# Patient Record
Sex: Male | Born: 1940 | Race: White | Hispanic: No | Marital: Married | Smoking: Never smoker
Health system: Southern US, Community
[De-identification: ages and names within clinical notes are randomized; demographics above are authoritative.]

## PROBLEM LIST (undated history)

## (undated) DIAGNOSIS — M1990 Unspecified osteoarthritis, unspecified site: Secondary | ICD-10-CM

## (undated) DIAGNOSIS — F32A Depression, unspecified: Secondary | ICD-10-CM

## (undated) DIAGNOSIS — G4700 Insomnia, unspecified: Secondary | ICD-10-CM

## (undated) DIAGNOSIS — M216X9 Other acquired deformities of unspecified foot: Secondary | ICD-10-CM

## (undated) DIAGNOSIS — L719 Rosacea, unspecified: Secondary | ICD-10-CM

## (undated) DIAGNOSIS — R0683 Snoring: Secondary | ICD-10-CM

## (undated) DIAGNOSIS — Z87442 Personal history of urinary calculi: Secondary | ICD-10-CM

## (undated) DIAGNOSIS — R748 Abnormal levels of other serum enzymes: Secondary | ICD-10-CM

## (undated) DIAGNOSIS — T7840XA Allergy, unspecified, initial encounter: Secondary | ICD-10-CM

## (undated) DIAGNOSIS — M2140 Flat foot [pes planus] (acquired), unspecified foot: Secondary | ICD-10-CM

## (undated) DIAGNOSIS — N200 Calculus of kidney: Secondary | ICD-10-CM

## (undated) DIAGNOSIS — E785 Hyperlipidemia, unspecified: Secondary | ICD-10-CM

## (undated) DIAGNOSIS — E119 Type 2 diabetes mellitus without complications: Secondary | ICD-10-CM

## (undated) DIAGNOSIS — M79673 Pain in unspecified foot: Secondary | ICD-10-CM

## (undated) DIAGNOSIS — F419 Anxiety disorder, unspecified: Secondary | ICD-10-CM

## (undated) DIAGNOSIS — K219 Gastro-esophageal reflux disease without esophagitis: Secondary | ICD-10-CM

## (undated) DIAGNOSIS — R739 Hyperglycemia, unspecified: Secondary | ICD-10-CM

## (undated) HISTORY — DX: Insomnia, unspecified: G47.00

## (undated) HISTORY — DX: Other acquired deformities of unspecified foot: M21.6X9

## (undated) HISTORY — DX: Pain in unspecified foot: M79.673

## (undated) HISTORY — DX: Gastro-esophageal reflux disease without esophagitis: K21.9

## (undated) HISTORY — DX: Rosacea, unspecified: L71.9

## (undated) HISTORY — DX: Flat foot (pes planus) (acquired), unspecified foot: M21.40

## (undated) HISTORY — DX: Abnormal levels of other serum enzymes: R74.8

## (undated) HISTORY — DX: Snoring: R06.83

## (undated) HISTORY — PX: VASECTOMY: SHX75

## (undated) HISTORY — PX: TONSILLECTOMY AND ADENOIDECTOMY: SUR1326

## (undated) HISTORY — DX: Allergy, unspecified, initial encounter: T78.40XA

## (undated) HISTORY — DX: Hyperglycemia, unspecified: R73.9

## (undated) HISTORY — PX: SPINE SURGERY: SHX786

## (undated) HISTORY — DX: Type 2 diabetes mellitus without complications: E11.9

## (undated) HISTORY — DX: Unspecified osteoarthritis, unspecified site: M19.90

## (undated) HISTORY — DX: Calculus of kidney: N20.0

## (undated) HISTORY — DX: Hyperlipidemia, unspecified: E78.5

---
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
## 2002-03-15 ENCOUNTER — Encounter: Payer: Self-pay | Admitting: Family Medicine

## 2002-03-15 ENCOUNTER — Encounter: Admission: RE | Admit: 2002-03-15 | Discharge: 2002-03-15 | Payer: Self-pay | Admitting: Family Medicine

## 2004-08-28 ENCOUNTER — Ambulatory Visit: Payer: Self-pay

## 2006-04-05 ENCOUNTER — Ambulatory Visit: Payer: Self-pay | Admitting: Sports Medicine

## 2006-04-12 ENCOUNTER — Ambulatory Visit: Payer: Self-pay | Admitting: Sports Medicine

## 2006-04-13 ENCOUNTER — Ambulatory Visit: Payer: Self-pay | Admitting: Gastroenterology

## 2006-04-27 ENCOUNTER — Ambulatory Visit: Payer: Self-pay | Admitting: Gastroenterology

## 2006-04-27 ENCOUNTER — Encounter: Payer: Self-pay | Admitting: Family Medicine

## 2006-07-04 ENCOUNTER — Encounter: Payer: Self-pay | Admitting: Family Medicine

## 2007-08-16 ENCOUNTER — Encounter: Admission: RE | Admit: 2007-08-16 | Discharge: 2007-08-16 | Payer: Self-pay | Admitting: Family Medicine

## 2009-01-14 HISTORY — PX: FOOT SURGERY: SHX648

## 2009-01-25 ENCOUNTER — Observation Stay (HOSPITAL_COMMUNITY): Admission: EM | Admit: 2009-01-25 | Discharge: 2009-01-26 | Payer: Self-pay | Admitting: Otolaryngology

## 2009-02-06 ENCOUNTER — Encounter: Payer: Self-pay | Admitting: Family Medicine

## 2009-03-20 ENCOUNTER — Ambulatory Visit: Payer: Self-pay

## 2009-03-20 ENCOUNTER — Encounter: Payer: Self-pay | Admitting: Cardiovascular Disease

## 2009-04-03 ENCOUNTER — Encounter: Payer: Self-pay | Admitting: Family Medicine

## 2009-09-03 ENCOUNTER — Ambulatory Visit: Payer: Self-pay | Admitting: Sports Medicine

## 2009-09-08 ENCOUNTER — Encounter: Payer: Self-pay | Admitting: Family Medicine

## 2009-10-06 ENCOUNTER — Ambulatory Visit: Payer: Self-pay | Admitting: Sports Medicine

## 2009-10-06 DIAGNOSIS — M79609 Pain in unspecified limb: Secondary | ICD-10-CM

## 2009-10-06 DIAGNOSIS — M2140 Flat foot [pes planus] (acquired), unspecified foot: Secondary | ICD-10-CM | POA: Insufficient documentation

## 2009-10-07 ENCOUNTER — Encounter: Admission: RE | Admit: 2009-10-07 | Discharge: 2009-12-03 | Payer: Self-pay | Admitting: Family Medicine

## 2009-10-24 ENCOUNTER — Ambulatory Visit: Payer: Self-pay | Admitting: Pulmonary Disease

## 2009-10-24 DIAGNOSIS — R0609 Other forms of dyspnea: Secondary | ICD-10-CM | POA: Insufficient documentation

## 2009-10-24 DIAGNOSIS — R0989 Other specified symptoms and signs involving the circulatory and respiratory systems: Secondary | ICD-10-CM

## 2009-10-24 DIAGNOSIS — E785 Hyperlipidemia, unspecified: Secondary | ICD-10-CM | POA: Insufficient documentation

## 2009-10-24 DIAGNOSIS — G4700 Insomnia, unspecified: Secondary | ICD-10-CM | POA: Insufficient documentation

## 2009-10-27 ENCOUNTER — Encounter: Payer: Self-pay | Admitting: Pulmonary Disease

## 2009-10-29 ENCOUNTER — Ambulatory Visit: Payer: Self-pay | Admitting: Pulmonary Disease

## 2009-11-03 ENCOUNTER — Telehealth (INDEPENDENT_AMBULATORY_CARE_PROVIDER_SITE_OTHER): Payer: Self-pay | Admitting: *Deleted

## 2009-11-17 ENCOUNTER — Encounter: Payer: Self-pay | Admitting: Family Medicine

## 2009-11-20 ENCOUNTER — Encounter: Payer: Self-pay | Admitting: Family Medicine

## 2009-11-20 ENCOUNTER — Encounter: Admission: RE | Admit: 2009-11-20 | Discharge: 2009-11-20 | Payer: Self-pay | Admitting: Family Medicine

## 2009-12-11 ENCOUNTER — Encounter: Admission: RE | Admit: 2009-12-11 | Discharge: 2009-12-11 | Payer: Self-pay | Admitting: Family Medicine

## 2010-02-10 ENCOUNTER — Encounter: Payer: Self-pay | Admitting: Family Medicine

## 2010-07-15 ENCOUNTER — Telehealth (INDEPENDENT_AMBULATORY_CARE_PROVIDER_SITE_OTHER): Payer: Self-pay | Admitting: *Deleted

## 2010-08-15 ENCOUNTER — Encounter: Payer: Self-pay | Admitting: Family Medicine

## 2010-08-19 ENCOUNTER — Encounter: Payer: Self-pay | Admitting: Family Medicine

## 2010-08-19 LAB — CONVERTED CEMR LAB
BUN: 12 mg/dL
Creatinine, Ser: 1.1 mg/dL
Hgb A1c MFr Bld: 5.8 %

## 2010-08-31 ENCOUNTER — Ambulatory Visit
Admission: RE | Admit: 2010-08-31 | Discharge: 2010-08-31 | Payer: Self-pay | Source: Home / Self Care | Attending: Family Medicine | Admitting: Family Medicine

## 2010-08-31 ENCOUNTER — Other Ambulatory Visit: Payer: Self-pay | Admitting: Family Medicine

## 2010-08-31 LAB — HEPATIC FUNCTION PANEL
ALT: 35 U/L (ref 0–53)
AST: 25 U/L (ref 0–37)
Albumin: 4.1 g/dL (ref 3.5–5.2)
Alkaline Phosphatase: 70 U/L (ref 39–117)
Bilirubin, Direct: 0.2 mg/dL (ref 0.0–0.3)
Total Bilirubin: 1.1 mg/dL (ref 0.3–1.2)
Total Protein: 6.5 g/dL (ref 6.0–8.3)

## 2010-08-31 LAB — BASIC METABOLIC PANEL
BUN: 25 mg/dL — ABNORMAL HIGH (ref 6–23)
CO2: 29 mEq/L (ref 19–32)
Calcium: 9 mg/dL (ref 8.4–10.5)
Chloride: 101 mEq/L (ref 96–112)
Creatinine, Ser: 1.2 mg/dL (ref 0.4–1.5)
GFR: 61.64 mL/min (ref >60.00)
Glucose, Bld: 119 mg/dL — ABNORMAL HIGH (ref 70–99)
Potassium: 4.4 mEq/L (ref 3.5–5.1)
Sodium: 137 mEq/L (ref 135–145)

## 2010-08-31 LAB — LIPID PANEL
Cholesterol: 150 mg/dL (ref 0–200)
HDL: 41.7 mg/dL (ref >39.00)
LDL Cholesterol: 85 mg/dL (ref 0–99)
Total CHOL/HDL Ratio: 4
Triglycerides: 118 mg/dL (ref 0.0–149.0)
VLDL: 23.6 mg/dL (ref 0.0–40.0)

## 2010-09-01 ENCOUNTER — Ambulatory Visit
Admission: RE | Admit: 2010-09-01 | Discharge: 2010-09-01 | Payer: Self-pay | Source: Home / Self Care | Attending: Family Medicine | Admitting: Family Medicine

## 2010-09-01 DIAGNOSIS — E119 Type 2 diabetes mellitus without complications: Secondary | ICD-10-CM | POA: Insufficient documentation

## 2010-09-02 ENCOUNTER — Encounter (INDEPENDENT_AMBULATORY_CARE_PROVIDER_SITE_OTHER): Payer: Self-pay | Admitting: *Deleted

## 2010-09-02 DIAGNOSIS — Z87442 Personal history of urinary calculi: Secondary | ICD-10-CM | POA: Insufficient documentation

## 2010-09-02 DIAGNOSIS — M129 Arthropathy, unspecified: Secondary | ICD-10-CM | POA: Insufficient documentation

## 2010-09-07 ENCOUNTER — Encounter (INDEPENDENT_AMBULATORY_CARE_PROVIDER_SITE_OTHER): Payer: Self-pay | Admitting: *Deleted

## 2010-09-15 NOTE — Assessment & Plan Note (Signed)
Summary: ORTHOTICS,MC   Vital Signs:  Patient profile:   70 year old male Pulse rate:   59 / minute BP sitting:   119 / 74  (left arm)  Vitals Entered By: Terese Door (October 06, 2009 11:29 AM) CC: Orthotics   CC:  Orthotics.  History of Present Illness: Patient returns for custom orthotics Bilat foot pain has been worse since he suffered 3 MT fxs after fall now pain in forefoot bilat pain under 2nd toe some pain at arch on RT left 2nd toe is hammered and he gets pressure on top  did well with custom orthotics now the pair he has is wearing down we padded this some more and that helped  however comes for a new pair to see if he can reduce pain  Allergies: 1)  ! 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  The orthotic was heated and the patient stood on the orthotic blank positioned on the orthotic stand. The patient was positioned in subtalar neutral position and 10 degrees of ankle dorsiflexion in a weight bearing stance. After completion of molding a stable based was applied to the orthotic blank.   The blank was ground to a stable position for weight bearing. 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

## 2010-09-15 NOTE — Assessment & Plan Note (Signed)
Summary: apnea link with AHI of 1/hr.   Copy to:  Self Referral Primary Provider/Referring Provider:  Dr. Dorris Singh [facility]   History of Present Illness: Apnea link reviewed and shows AHI of 1/hr with no significant oxygen desaturation.  I suspect the pt's frequent awakenigs and inability to return to sleep is related to his admitted stress and anxiety.  Allergies: 1)  ! * Mycins   Other Orders: Pulse Oximetry, Overnight (04540)  Patient Instructions: 1)  work on behavioral therapies we discussed 2)  see behavioral therapist to work on anxiety and stress.

## 2010-09-15 NOTE — Progress Notes (Signed)
Summary: request for apnea link results  Phone Note Call from Patient Call back at Home Phone [phone]   Caller: Patient Call For: clance  Summary of Call: wants the results of apena test Initial call taken by: Lacinda Axon,  November 03, 2009 8:22 AM  Follow-up for Phone Call        Please advise results, thanks Greg Rogers  November 03, 2009 9:02 AM   Additional Follow-up for Phone Call Additional follow up Details #1::        let pt know that his apnea link showed no significant sleep apnea.  He needs to work on some of the things we discussed, and also to see behavioral therapist to help with stress and anxiety. Additional Follow-up by: Barbaraann Share MD,  November 05, 2009 6:46 PM    Additional Follow-up for Phone Call Additional follow up Details #2::    The patient is aware apnea link showed no significant sleep apnea and tha he needs to work on the things that they had discussed at his last ov and to see behavioral therapist to help with stress and anxiety. The patient had verbal understanding.i Follow-up by: Michel Bickers CMA,  November 06, 2009 8:56 AM

## 2010-09-15 NOTE — Progress Notes (Signed)
Summary: wants labs prior to visit  Phone Note Call from Patient Call back at Home Phone [phone]   Caller: Patient Summary of Call: Pt is coming in to see you on january 17 and he would like to have labs prior.  He is interested in having his blood glucose checked. Initial call taken by: Lowella Petties CMA, AAMA,  July 15, 2010 4:01 PM  Follow-up for Phone Call        cmet,lipid 272.0 Follow-up by: Crawford Givens MD,  July 15, 2010 9:07 PM  Additional Follow-up for Phone Call Additional follow up Details #1::        Lab appt made, Jan 16,2011 @ 8:10 Additional Follow-up by: Mills Koller,  July 16, 2010 8:17 AM

## 2010-09-15 NOTE — Assessment & Plan Note (Signed)
Summary: self referral for sleep disruption/insomnia   Copy to:  Self Referral Primary Provider/Referring Provider:  Dr. Dorris Singh Physicans  CC:  Sleep Consult.  History of Present Illness: The pt is a 70y/o male who comes in today for evaluation of sleeping issues.  The pt's primary complaint is that of frequent awakenings in the early morning hours, and often an issue with getting back to sleep once awake.  He does snore, and tried breath-rite strips, with improved airflow but no change in awakenings.  He has not be noted to have abnormal breathing pattern, nor has he had choking arousals.  He typically gets to bed around 11pm, and initiates sleep easily.  He starts to awaken around 3am, and will often have 3 or more awakenings for the rest of the night.  He sometimes gets back to sleep quickly, but other times may take 1 1/2 hours.  He typically stays in bed and tosses and turns.  He gets up btw 7-8am to start his day, and does feel rested.  He denies any RLS symptoms or kicking during the night.  He denies daytime sleep pressure with computer, tv, or reading unless he is "bored".  He denies any sleepiness with driving.  He denies any chronic pain that may be disturbing his sleep, but does have a lot of marital issues currently which are causing anxiety and stress.    Preventive Screening-Counseling & Management  Alcohol-Tobacco     Smoking Status: never  Current Medications (verified): 1)  Aspirin Ec Low Dose 81 Mg Tbec (Aspirin) .... Take 2 Tabs By Mouth Daily 2)  Simvastatin 10 Mg Tabs (Simvastatin) .... Take 1 Tab By Mouth At Bedtime  Allergies (verified): 1)  ! 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 sister with DM2 parents had DM 1 heart disease: father   Social History: Reviewed history from 10/06/2009 and no changes required.  gardens and stays active walking dog. pt is married. pt has children. pt is retired. still works part-time at [employer] job Patient never smoked.  Smoking Status:  never  Review of Systems  The patient denies shortness of breath with activity, shortness of breath at rest, productive cough, non-productive cough, coughing up blood, chest pain, irregular heartbeats, acid heartburn, indigestion, loss of appetite, weight change, abdominal pain, difficulty swallowing, sore throat, tooth/dental problems, headaches, nasal congestion/difficulty breathing through nose, sneezing, itching, ear ache, anxiety, depression, hand/feet swelling, joint stiffness or pain, rash, change in color of mucus, and fever.    Vital Signs:  Patient profile:   70 year old male Height:      67 inches Weight:      169.25 pounds BMI:     26.60 O2 Sat:      98 % on Room air Temp:     97.7 degrees F oral Pulse rate:   61 / minute BP sitting:   124 / 78  (left arm) Cuff size:   regular  Vitals Entered By: Arman Filter LPN (October 24, 2009 11:23 AM)  O2 Flow:  Room air CC: Sleep Consult Comments Medications reviewed with patient Arman Filter LPN  October 24, 2009  11:23 AM    Physical Exam  General:  thin male in nad Eyes:  PERRLA and EOMI.   Nose:  deviated septum to right with narrowing left patent Mouth:  no significant elongation of soft palate and uvula Neck:  no jvd, tmg, LN Lungs:  clear to auscultation Heart:  rrr, no mrg Abdomen:  soft and nontender, bs+ Extremities:  no edema noted, no cyanosis pulses intact distally Neurologic:  alert and oriented, moves all 4.   Impression & Recommendations:  Problem # 1:  PERSISTENT DISORDER INITIATING/MAINTAINING SLEEP  (ICD-307.42) the pt is having issues with moderate sleep disruption at night.  It is unclear what is waking him up, then his anxiety and stress kick in to keep him from getting back to sleep.  It is unclear if he has a sleep disorder that is causing the awakenings.  He really does not have a history c/w osa, but could have snoring arousals.  He has no obvious RLS symptoms, and doesn't think there is any environmental issues that need to be addressed.  I have gone over various behavioral techniques with him that may help with his inability to re-initiate sleep (ie stimulus control therapy).  Ultimately, I have encouraged him to seek counseling with behavioral therapist to help work thru his anxiety and stress associated with his marital issues.  He is concerned that he may have sleep apnea, and wishes to have testing done.  Will do screening home study to see if there is a suggestion of sleep disordered breathing.    Medications Added to Medication List This Visit: 1)  Aspirin Ec Low Dose 81 Mg Tbec (Aspirin) .... Take 2 tabs by mouth daily 2)  Simvastatin 10 Mg Tabs (Simvastatin) .... Take 1 tab by mouth at bedtime  Other Orders: New Patient Level IV (16109) Misc. Referral (Misc. Ref)  Patient Instructions: 1)  will do screening study at home to make sure you do not have sleep apnea.  Will call with results. 2)  work on behavioral therapies that we have discussed.

## 2010-09-15 NOTE — Assessment & Plan Note (Signed)
Summary: L FOOT ISSUES,MC   Vital Signs:  Patient profile:   70 year old male Height:      67 inches Weight:      162 pounds BMI:     25.46 BP sitting:   123 / 66  Vitals Entered By: Lillia Pauls CMA (September 03, 2009 9:23 AM)  History of Present Illness: Patient suffered 4 fractures to 3 of his left metatarsals after falling from a ladder this past summer. He also suffered a left 2nd toe dislocation which was surgically repaired. He reported for 2 post-op visits and x-rays. He was discharged from follow-up as he progressed well.  No pain/numbness/tingling/swelling/discoloration in the interim. However patient is aware of his multiple foot defomities and he feels as if his distal left metatarsals hit the ground when he walks. Thus he seeks preventive advice. He had tried comforthotics with MT pads which did not signifcantly help him. He had tried custom orthotics 4 yrs ago from which he derived significantly increased comfort.   Allergies (verified): 1)  ! * Mycins PMH-FH-SH reviewed for relevance  Physical Exam  General:  Well-developed,well-nourished,in no acute distress; alert,appropriate and cooperative throughout examination Msk:  KNEES: Full ROM/strength.  ANKLES/FEET: 20 deg external rotation on the right. Calcaneal Valgus (R>L). Excesiive pronation and pes planus (R>L). Hallux Valgus. Hammering of toes 2-5. Subluxed left 2nd toe. Supination of small toes. Transverse arch collapse. 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 No ttp. Full strength. No ROM of the great toes. Extendable hammer toes.  SHOES: Tight. No room for big toe. Minimal room for smaller toes.  GAIT: Excessive pronation which is significantly decreased on orthotic insertion.   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 Patient noticed increased comfort afterward. - Use  orthotics in walking shoes. - Purchase shoes approximately 1/2 size larger than current shoes. 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 Will add blue MT pads to plantar aspect of these orthotics.

## 2010-09-16 ENCOUNTER — Telehealth: Payer: Self-pay | Admitting: Family Medicine

## 2010-09-16 ENCOUNTER — Encounter: Payer: Self-pay | Admitting: Family Medicine

## 2010-09-17 ENCOUNTER — Telehealth: Payer: Self-pay | Admitting: Family Medicine

## 2010-09-17 NOTE — Miscellaneous (Signed)
Summary: med list update - test strips  Clinical Lists Changes  Medications: Added new medication of ACCU-CHEK AVIVA  STRP (GLUCOSE BLOOD) check blood sugar daily     Prior Medications: ASPIRIN EC LOW DOSE 81 MG TBEC (ASPIRIN) take 2 tabs by mouth daily SIMVASTATIN 10 MG TABS (SIMVASTATIN) Take 1 tab by mouth at bedtime MULTIVITAMINS   TABS (MULTIPLE VITAMIN) Once every 3 days. METROGEL 1 % GEL (METRONIDAZOLE) AAA two times a day ACCU-CHEK AVIVA  STRP (GLUCOSE BLOOD) check blood sugar daily Current Allergies: ! * MYCINS ! PNEUMOVAX 23

## 2010-09-17 NOTE — Letter (Signed)
Summary: Greg Rogers at [facility] at [name]   Imported By: Maryln Gottron 08/25/2010 13:10:28  _____________________________________________________________________  External Attachment:    Type:   Image     Comment:   External Document

## 2010-09-17 NOTE — Letter (Signed)
Summary: [facility]  [facility]   Imported By: Maryln Gottron 08/25/2010 13:16:02  _____________________________________________________________________  External Attachment:    Type:   Image     Comment:   External Document

## 2010-09-17 NOTE — Miscellaneous (Signed)
Clinical Lists Changes  Problems: Added new problem of NEPHROLITHIASIS, HX OF (ICD-V13.01) Added new problem of ARTHRITIS (ICD-716.90) Added new problem of FAMILY HISTORY OF COLON CA 1ST DEGREE RELATIVE <60 (ICD-V16.0) Added new problem of FAMILY HISTORY DIABETES 1ST DEGREE RELATIVE (ICD-V18.0) Observations: Added new observation of PT OCCUPAT: employed (09/02/2010 10:28) Added new observation of SOCIAL HX: Occupation: Retired (Nov. 2007) 27-1/2 years of service.  Was Pesticide Barrister's clerk Education:  Agricultural consultant at [employer], part time at [employer] now pt is married, 1969, 4 kids (two are MDs) pt is retired. still works part-time at [employer] job Patient never smoked.  gardens and stays active walking dog, enjoys skiing, very active but no formal exercise program, walks at least 1/2 mile daily. alcohol: wine, 6 glasses a week on average Drug use-no   (09/02/2010 10:28) Added new observation of DRUG USE: no (09/02/2010 10:28) Added new observation of FAMILY HX: sister with DM2 heart disease: father, first MI at 21, deat at 108 mother dead at 31 father, first MI at 15, deat at 46 GF with DM1 Family History of Arthritis, grandparents, other blood relative Family History of Colon CA 1st degree relative <60, other blood relative Family History Diabetes 1st degree relative, grandparents, other blood relative Family History High cholesterol, parents Family History Hypertension, other blood relative Family History of Heart Disease, parents, grandparents, other blood relative Family History of Emotional/Mental Illness, other blood relative (09/02/2010 10:28) Added new observation of FH HTN: Family History Hypertension (09/02/2010 10:28) Added new observation of FH-HIGHCHOL: Family History High cholesterol (09/02/2010 10:28) Added new observation of FH DIABETES: Family History Diabetes 1st degree relative (09/02/2010 10:28) Added new observation of FH COLON CA:  Family History of Colon CA 1st degree relative <60 (09/02/2010 10:28) Added new observation of FH ARTHRITIS: Family History of Arthritis (09/02/2010 10:28) Added new observation of PAST SURG HX: June 2010 L foot surgery  1949  appendectomy at age [age]  hernia repairs at age 88(single) 89  hernia repair (double) 1960  cyst removed from breast at age 15  ~ 60  tonsillectomy and adenoidectomy  ~ 1984 Vasectomy  (09/02/2010 10:28) Added new observation of PAST MED HX: OTHER ACQUIRED DEFORMITY OF ANKLE AND FOOT OTHER (ICD-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.79) rosacea H/O ISOLATED GGT WITH O/W NORMAL W/U  ARTHRITIS (ICD-716.90) NEPHROLITHIASIS, HX OF (ICD-V13.01) HEALTH SCREENING (ICD-V70.0) HYPERGLYCEMIA, MILD (ICD-790.29) SNORING (ICD-786.09) PERSISTENT DISORDER INITIATING/MAINTAINING SLEEP (ICD-307.42) HYPERLIPIDEMIA (ICD-272.4) PES PLANUS (ICD-734) FOOT PAIN, BILATERAL (ICD-729.5)   (09/02/2010 10:28) Added new observation of PMH KIDNY ST: yes (09/02/2010 10:28) Added new observation of PNEUMOVAX: given (08/17/2003 10:39) Added new observation of COLONOSCOPY: Done (08/17/2003 10:39)      Past History:  Past Medical History: OTHER ACQUIRED DEFORMITY OF ANKLE AND FOOT OTHER (ICD-736.79) rosacea H/O ISOLATED GGT WITH O/W NORMAL W/U  ARTHRITIS (ICD-716.90) NEPHROLITHIASIS, HX OF (ICD-V13.01) HEALTH SCREENING (ICD-V70.0) HYPERGLYCEMIA, MILD (ICD-790.29) SNORING (ICD-786.09) PERSISTENT DISORDER INITIATING/MAINTAINING SLEEP (ICD-307.42) HYPERLIPIDEMIA (ICD-272.4) PES PLANUS (ICD-734) FOOT PAIN, BILATERAL (ICD-729.5)    Past Surgical History: June 2010 L foot surgery  1949  appendectomy at age [age]  hernia repairs at age 76(single) 41  hernia repair (double) 1960  cyst removed from breast at age 10  ~ 76  tonsillectomy and adenoidectomy  ~ 1984 Vasectomy   Family History: sister with DM2 heart disease: father, first MI at 70, deat at 17 mother dead at 5 father, first MI at 13, deat  at 7 GF with DM1 Family History of Arthritis, grandparents, other blood  relative Family History of Colon CA 1st degree relative <60, other blood relative Family History Diabetes 1st degree relative, grandparents, other blood relative Family History High cholesterol, parents Family History Hypertension, other blood relative Family History of Heart Disease, parents, grandparents, other blood relative Family History of Emotional/Mental Illness, other blood relative  Social History: Occupation: Retired (Nov. 2007) 27-1/2 years of service.  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 still works part-time at [employer] job Patient never smoked.  gardens and stays active walking dog, enjoys skiing, very active but no formal exercise program, walks at least 1/2 mile daily. alcohol: wine, 6 glasses a week on average Drug use-no  Drug Use:  no Occupation:  employed    Preventive Care Screening  Last Pneumovax:    Date:  08/17/2003    Results:  given  Colonoscopy:    Date:  08/17/2003    Results:  Done     ALLERGIC TO PNEUMOVAX GIVEN IN 2005

## 2010-09-17 NOTE — Letter (Signed)
Summary: Greg Rogers at [facility] at [facility]   Imported By: Maryln Gottron 08/25/2010 13:12:21  _____________________________________________________________________  External Attachment:    Type:   Image     Comment:   External Document

## 2010-09-17 NOTE — Letter (Signed)
Summary: [facility]  [facility]   Imported By: Maryln Gottron 08/25/2010 13:14:51  _____________________________________________________________________  External Attachment:    Type:   Image     Comment:   External Document

## 2010-09-17 NOTE — Letter (Signed)
Summary: Greg Rogers at [facility] at [facility]   Imported By: Maryln Gottron 08/25/2010 13:08:56  _____________________________________________________________________  External Attachment:    Type:   Image     Comment:   External Document

## 2010-09-17 NOTE — Assessment & Plan Note (Signed)
Summary: TRANSFER FROM [facility]   Vital Signs:  Patient profile:   70 year old male Height:      67 inches Weight:      165.75 pounds BMI:     26.05 Temp:     97.7 degrees F oral Pulse rate:   80 / minute Pulse rhythm:   regular BP sitting:   124 / 68  (left arm) Cuff size:   regular  Vitals Entered By: Delilah Shan CMA [place] [facility]) (September 01, 2010 8:30 AM) CC: Transfer from [place] - [facility], Preventive Care   History of Present Illness: Elevated Cholesterol: Using medications without problems:yes Muscle aches: minimal and rare- if worse he'll let me know Other complaints: no  Sugar had been >100 - 125 on home check.  labs d/w patient.  Known h/o prediabetes.   Tick bites last summer.  Was achy.  Was put on doxy for 28 days.  Got antibody testing was done and this was negative.  Sx resolved in meantime.    Current Medications (verified): 1)  Aspirin Ec Low Dose 81 Mg Tbec (Aspirin) .... Take 2 Tabs By Mouth Daily 2)  Simvastatin 10 Mg Tabs (Simvastatin) .... Take 1 Tab By Mouth At Bedtime 3)  Multivitamins   Tabs (Multiple Vitamin) .... Once Every 3 Days. 4)  Metrogel 1 % Gel (Metronidazole) .... Aaa Two Times A Day  Allergies: 1)  ! * Mycins 2)  ! Pneumovax 23  Past History:  Past Medical History: HYPERLIPIDEMIA (ICD-272.4) PES PLANUS (ICD-734) FOOT PAIN, BILATERAL (ICD-729.5) OTHER ACQUIRED DEFORMITY OF ANKLE AND FOOT OTHER (ICD-736.79) rosacea H/O ISOLATED GGT WITH O/W NORMAL W/U   Past Surgical History: L foot surgery June 2010 appendectomy at age 67 hernia repairs at age 5 cyst removed from breast at age 43 tonsillectomy Vasectomy  Family History: Reviewed history from 10/24/2009 and no changes required. sister with DM2 heart disease: father, first MI at 39, deat at 29 mother dead at 65 father, first MI at 66, deat at 22 GF with DM1  Social History: Reviewed history from 10/24/2009 and no changes required. gardens and stays active walking dog,  enjoys skiing pt is married, 1969, 4 kids (two are MDs) pt is retired. still works part-time at [employer] job Patient never smoked.  Entemology at [facility], part time at [employer] now alcohol: wine, 6 glasses a week on average  Review of Systems       See HPI.  Otherwise negative.    Physical Exam  General:  GEN: nad, alert and oriented HEENT: mucous membranes moist NECK: supple w/o LA CV: rrr.  no murmur PULM: ctab, no inc wob ABD: soft, +bs EXT: no edema SKIN: no acute rash  Rectal:  No external abnormalities noted except for old, small ext hemorrhoid.   Normal sphincter tone. No rectal masses or tenderness. Prostate:  Prostate gland firm and smooth, no enlargement, nodularity, tenderness, mass, asymmetry or induration.   Impression & Recommendations:  Problem # 1:  HYPERGLYCEMIA, MILD (ICD-790.29) >25 min spent with patient, at least half of which was spent on counseling re: plan.  continue working on diet and exercise.  Recent labs d/w patient.  other issues.  PSA options were discussed along with recent recs.  No indication for psa at this point, since patient is low risk and there is no FH of prosate CA.  He agreed and declined testing of PSA.   Problem # 2:  HYPERLIPIDEMIA (ICD-272.4) No change in meds.  labs d/w patient.  His updated medication list for this problem includes:    Simvastatin 10 Mg Tabs (Simvastatin) .Marland Kitchen... Take 1 tab by mouth at bedtime  Complete Medication List: 1)  Aspirin Ec Low Dose 81 Mg Tbec (Aspirin) .... Take 2 tabs by mouth daily 2)  Simvastatin 10 Mg Tabs (Simvastatin) .... Take 1 tab by mouth at bedtime 3)  Multivitamins Tabs (Multiple vitamin) .... Once every 3 days. 4)  Metrogel 1 % Gel (Metronidazole) .... Aaa two times a day  Colorectal Screening:  Current Recommendations:    Hemoccult: NEG X 1 today  Immunization & Chemoprophylaxis:    Tetanus vaccine: Historical  (08/16/2004)  Patient Instructions: 1)  Keep working on your diet and  exercise.  I would check your A1c in 6 months with an OV a few days after that.  dx 790.29. 2)  Don't change your meds in the meantime.   3)  Take care and let me know if you have other concerns.  Glad to see you.  Prescriptions: SIMVASTATIN 10 MG TABS (SIMVASTATIN) Take 1 tab by mouth at bedtime  #90 x 3   Entered and Authorized by:   Crawford Givens MD   Signed by:   Crawford Givens MD on 09/01/2010   Method used:   Electronically to        [facility]  [address] [phone]* (retail)       [address]       [place], [place]  [id number]       Ph: [phone] or [phone]       Fax: [phone]   RxID:   [id number]    Orders Added: 1)  Est. Patient 65& > [99397] 2)  Est. Patient Level III [38756]   Immunization History:  Tetanus/Td Immunization History:    Tetanus/Td:  historical (08/16/2004)   Immunization History:  Tetanus/Td Immunization History:    Tetanus/Td:  Historical (08/16/2004)  Current Allergies (reviewed today): ! Hilbert Bible ! PNEUMOVAX 23

## 2010-09-23 ENCOUNTER — Telehealth: Payer: Self-pay | Admitting: Family Medicine

## 2010-09-23 NOTE — Progress Notes (Signed)
  Phone Note Outgoing Call   Summary of Call: I called patient.  We requested his old records, but I don't see a copy of his colonosopy report.  I called and LMOVM for patient to call back.  I will await his return call. Crawford Givens MD  September 16, 2010 6:13 PM

## 2010-09-23 NOTE — Progress Notes (Signed)
  Phone Note Outgoing Call   Summary of Call: I called patient.  His colonoscopy was done  ~6 years ago and was normal per patient.  He advised me to call the health service at [employer] and see if they had a copy.  I did and they will get him to sign a release to send the copy to me.  Crawford Givens MD  September 17, 2010 2:08 PM

## 2010-10-01 NOTE — Progress Notes (Signed)
Summary: colonoscopy  Phone Note Call from Patient Call back at Home Phone [phone]   Caller: Patient Summary of Call: Patient wanted me to let you know that he has faxed over a copy of his last colonoscopy dated 04-27-2006. Report is on your desk. Initial call taken by: Melody Comas,  September 23, 2010 11:52 AM  Follow-up for Phone Call        Noted.  I entered in EMR.  Please scan hard copy. Crawford Givens MD  September 23, 2010 1:06 PM       Colonoscopy  Procedure date:  04/27/2006  Findings:      RESULTS NORMAL, done by Dr. Arlyce Dice  Procedures Next Due Date:    Colonoscopy: 04/2016

## 2010-10-07 NOTE — Letter (Signed)
Summary: note   note   Imported By: Kassie Mends 09/28/2010 09:32:20  _____________________________________________________________________  External Attachment:    Type:   Image     Comment:   External Document

## 2010-10-13 NOTE — Procedures (Signed)
Summary: Colonoscopy/[facility]  Colonoscopy/[facility]   Imported By: Lanelle Bal 10/05/2010 15:42:11  _____________________________________________________________________  External Attachment:    Type:   Image     Comment:   External Document

## 2010-11-23 LAB — POCT I-STAT 4, (NA,K, GLUC, HGB,HCT)
Potassium: 3.7 mEq/L (ref 3.5–5.1)
Sodium: 141 mEq/L (ref 135–145)

## 2010-12-29 NOTE — Discharge Summary (Signed)
Greg Rogers, Greg Rogers NO.:  [id number]   MEDICAL RECORD NO.:  [id number]          PATIENT TYPE:  OBV   LOCATION:  5013                         FACILITY:  [facility]   PHYSICIAN:  Eulas Post, MD    DATE OF BIRTH:  April 30, 1941   DATE OF ADMISSION:  01/25/2009  DATE OF DISCHARGE:  01/26/2009                               DISCHARGE SUMMARY   ADMISSION DIAGNOSES:  1. Left second metatarsophalangeal joint dislocation.  2. Left third, fourth, and fifth metatarsal fractures including a      Jones fracture.   DISCHARGE MEDICATIONS:  Percocet 1-2 tablets p.o. q.6 h. p.r.n. pain.   DISCHARGE INSTRUCTIONS:  He should be nonweightbearing and elevate the  left lower extremity.  He is not allowed to drive or operate heavy  machinery until 7 p.m. on January 26, 2009.  This is 24 hours after his  general anesthesia.  He should also be nonweightbearing using crutches  with the left lower extremity.   FOLLOWUP:  Two weeks with myself as an outpatient.   DISCHARGE DIAGNOSES:  1. Left second metatarsophalangeal joint dislocation.  2. Left third, fourth, and fifth metatarsal fractures including a      Jones fracture.   HOSPITAL COURSE:  Mr. Greg Rogers is a 70 year old gentleman who fell  from a ladder on January 24, 2009.  He had multiple foot fractures and on x-  ray had a second metatarsophalangeal joint dislocation.  He did have a  history of hammertoe, but he says that he was never told that it was  actually dislocated.  He was treated conservatively for this.  He had an  attempted closed reduction in Urgent Care for the treatment of his acute  MTP dislocation, which did not successfully reduce the joint.  It was  unclear whether or not his second metatarsophalangeal joint was  dislocated due to his chronic hammer toe, or due to his acute major  trauma to his foot.  He had significant soft tissue swelling as well as  dimpling of the skin at the second metatarsophalangeal joint.  Therefore, he was brought urgently to Surgery for open reduction and  evaluation of the joint.  There was some soft tissue interposed within  the joint; however, there seemed to be a contracture of the dorsal  capsular tissue.  Therefore, I am not 100% sure whether or not this was  a traumatic dislocation or secondary to his hammertoe disease.  Nevertheless, we were able to get the capsular tissue released and do  some additional soft tissue balancing in order to allow for satisfactory  joint reduction.  In a passive state at this point, his joint remained  reduced.  I did not use a K-wire for this reason, as well as the fact  that pre-operatively he indicated to me that he has a tendency towards  being non-compliant, and would have a hard time with non-weight bearing  restrictions.  In light of this, I did not want to put him at risk for  hardware failure and broken intra-articular broken k-wires.  He was then  admitted to the hospital for overnight observation.  He did well and his  pain was well controlled and he was keeping the foot elevated.  I had a  long discussion with him because he was planning on leaving early this  morning and driving himself home.  I recommended that he either take a  taxi or wait until 7 o'clock or the recommended time between anesthesia  and when he can drive has arrived.  I have also talked with his wife  last night and tried to help them plan accordingly so that his wife  could have someone here to pick him up and drive his truck home along  with him, so he was not required to drive.  She was going to work on  this.  He has discussed that he wants to sign an against medical advice  form in order that he can leave and drive himself home.  I have  indicated to him that I am not sure that this is adequate, and that I,  nor the hospital, will assume any liability for his actions should he  decide to leave against medical advice.  He certainly is competent for   consent and competent to make this decision on his own; however, and not  sure medicolegally how this works with  the policy of anesthesia and the hospital requirements.  Therefore, I  have told him that my recommendation is that he wait until 7 o'clock to  drive himself home tonight, or alternatively have his wife bring an  extra friend that can drive this patient home.  I will plan to see him  in 2 weeks as an outpatient.      Eulas Post, MD  Electronically Signed     JPL/MEDQ  D:  01/26/2009  T:  01/26/2009  Job:  [id number]

## 2010-12-29 NOTE — H&P (Signed)
NAMEPANCHO, RUSHING NO.:  [id number]   MEDICAL RECORD NO.:  [id number]          PATIENT TYPE:  OBV   LOCATION:  5013                         FACILITY:  [facility]   PHYSICIAN:  Eulas Post, MD    DATE OF BIRTH:  04/28/1941   DATE OF ADMISSION:  01/25/2009  DATE OF DISCHARGE:                              HISTORY & PHYSICAL   CHIEF COMPLAINT:  Left foot pain.   HISTORY:  Mr. Greg Schertzer is a 70 year old gentleman who fell off of a  ladder on January 24, 2009 and injured his left foot.  He was reluctant to  seek medical advice.  He came to urgent care today and was evaluated and  x-rays were taken.  Attempted reduction was carried out in urgent care,  after the x-rays demonstrated evidence for a second metatarsophalangeal  joint dislocation, as well as a third, fourth, and fifth metatarsal  fractures.  He had dimpling of the skin, and closed reduction was unable  to be achieved.  Therefore, he was sent over to the [facility]  for urgent operative intervention for his dislocation.  He complains of  moderate to severe pain in the foot and describes it as sharp.  He has  had significant soft tissue swelling.  Bearing weight makes it worse and  keeping elevated makes it better.  He has a history of a hammer toe on  the 2nd, and was treated with shoe-wear modification.  He says he does  not know if he ever had an x-ray of his foot, and does not think it was  ever dislocated before.  He does think that the toe is shorter than  normal since his fall from the ladder.  The hammer toe bothered him  mostly with irritation from his shoes.   PAST MEDICAL HISTORY:  He denies any significant medical problems.   FAMILY HISTORY:  His father had a heart attack at age 42 and he has  diabetes in the family.   SOCIAL HISTORY:  He is a nonsmoker and is retired.   REVIEW OF SYSTEMS:  Complete review of systems was performed and was  otherwise negative with the exception of  those noted in the  musculoskeletal system.   PHYSICAL EXAMINATION:  CONSTITUTIONAL:  He is alert and oriented x3, in  no acute distress, and is well developed and well nourished.  EYE:  Extraocular movements are intact.  LYMPHATIC:  He has no axillary or cervical lymphadenopathy.  CV:  No pedal edema, although, he has significant soft tissue swelling  in the left foot.  RESPIRATORY:  He has no cyanosis and no increase in respiratory efforts.  PSYCHIATRIC:  His judgment and insight are intact, and he is competent  for consent.  SKIN:  He has no open wounds on his foot, but he has significant soft  tissue swelling with ecchymosis throughout.  He is tender along the  dorsum and plantar aspect of the foot as well as the second  metatarsophalangeal joint.  He has no tenderness along the medial column  of the first ray.  NEUROLOGIC:  His sensation seems to be intact throughout the foot.  MSK:  Significant soft tissue swelling throughout foot, with tenderness  along the midfoot, and some pain as well at the 2nd metatarsal-  phalangeal joint. The 2nd toe has severe hyperextension of the MTP, and  the PIP joint is flexed.  This is not correctable with manipulation, and  does cause pain.   X-RAYS:  X-rays taken at urgent care demonstrated evidence for a second  metatarsophalangeal joint dislocation.  There is also evidence for  third, fourth, and fifth metatarsal fractures, and on the fifth  metatarsal, are involved both the distal aspect as well as the proximal  aspect.   IMPRESSION:  Left second metatarsophalangeal joint dislocation along  with third, fourth, and fifth metatarsal fractures, including the base  of the fifth.   PLAN:  Unfortunately, given the skin dimpling, it appears that he  probably has soft tissue that is interposed within the first  metatarsophalangeal joint.  It is theoretically possible that his joint  dislocation may be due to his chronic hammer-toe, but his  x-rays and  clinical presentation are more consistent with an acute dislocation,  given the tibial deviation on the AP, and also the fact that he believes  his toe is shorter than normal.  I have recommended an urgent open  reduction.  We have also discussed the possibility of pin placement if  the joint is not stable after the open reduction, although he has  already indicated to me that he has a tendency towards non-compliance,  which makes me less inclined to place a pin across the joint, which  could ultimately lead to a broken pin if he walks on it, and thereby a  potentially difficult to solve hardware complication.  We have also  discussed the options for management of his multiple foot fractures.  Specifically, we have discussed the management of his Jones fracture on  the fifth metatarsal, and the risks for nonunion or delayed union.  He,  at the current time, is inclined towards managing his foot fractures  conservatively, and not placing any hardware, and simply proceeding with  an open reduction of the second metatarsophalangeal joint.  Therefore,  we will plan to proceed with this plan.  The risks, benefits, and  alternatives to operative intervention on the second metatarsophalangeal  joint were discussed with him preoperatively including, but not limited  to the risks of infection, bleeding, nerve injury, recurrent  dislocation, posttraumatic arthritis, stiffness, the need for further  future surgical intervention, compartment syndrome of the foot,  cardiopulmonary complications among others and he is willing to proceed.  We also decided to manage his 3rd, 4th, and 5th foot fractures  conservatively without manipulation or surgical intervention.  He will  be admitted to the hospital overnight for pain control, elevation, and  postoperative antibiotics.      Eulas Post, MD  Electronically Signed     JPL/MEDQ  D:  01/25/2009  T:  01/26/2009  Job:  [id number]

## 2010-12-29 NOTE — Op Note (Signed)
NAMEGERONIMO, DILIBERTO NO.:  [id number]   MEDICAL RECORD NO.:  [id number]          PATIENT TYPE:  OBV   LOCATION:  5013                         FACILITY:  [facility]   PHYSICIAN:  Eulas Post, MD    DATE OF BIRTH:  Jul 15, 1941   DATE OF PROCEDURE:  01/25/2009  DATE OF DISCHARGE:                               OPERATIVE REPORT   PREOPERATIVE DIAGNOSIS:  Left second metatarsophalangeal joint  dislocation.   POSTOPERATIVE DIAGNOSIS:  Left second metatarsophalangeal joint  dislocation.   OPERATIVE PROCEDURE:  Open reduction left second metatarsophalangeal  joint dislocation.   ANESTHESIA:  General.   TOURNIQUET TIME:  30 minutes.   PREOPERATIVE INDICATIONS:  Mr. Saahas Hidrogo is a 70 year old gentleman  who fell off a ladder yesterday.  He had fifth metatarsal base and neck  fracture, fourth metatarsal fracture, and third metatarsal fracture as  well.  We have already decided to treat these conservatively without  manipulation, according to his wishes.  He also sustained what appears  to be an acute second metatarsophalangeal joint dislocation.  He had a  previous history of having a hammer toe on his second toe, and states  that he had this evaluated, and tired different heights of braces, but  this did not alleviate his problem.  He states he is not aware that the  joint was every dislocated before.  He said that the toe did appear  shortened compared to what it normally does, now with substantial soft  tissue swelling.  Therefore, wife felt that this was an acute  dislocation.  Closed reduction was attempted in Urgent Care, but this  was unsuccessful.  Therefore, he elected to proceed with an open  reduction of the second metatarsophalangeal joint dislocation.  We also  discussed the operative treatment of his multiple other fractures of the  foot, especially his joint fracture, and his desire was to attempt  conservative management for these fractures prior to  proceeding with any  type of hardware.  Therefore, the risks, benefits, and alternatives to  isolated second metatarsophalangeal joint open reduction was discussed  with him and including but not limited to the risks of infection,  bleeding, nerve injury, malunion of the metatarsals, nonunion, recurrent  dislocation, recurrent hammer toe, neurologic injury, instability of the  toe, the need for future surgical intervention on both the  metatarsophalangeal joint, as well as his other fractures,  cardiopulmonary complications, among others, and he was willing to  proceed.   OPERATIVE PROCEDURE:  The patient was brought to the operating room and  placed in a supine position.  Intravenous Ancef 1 g was given.  The left  lower extremity was prepped and draped in the usual sterile fashion  after general anesthesia was administered.  The leg was elevated and a  tourniquet was inflated.  Incision was made dorsally over the second  metatarsophalangeal joint.  The joint was exposed and care was taken to  protect the neurovascular structures on the tibial and fibular side.  The capsule was incised sharply and the soft tissue interposed within  the joint  was swept aside.  This allowed for joint reduction.  The joint  was then examined under C-arm and it was found to be anatomic.  The soft  tissue around the joint was balanced in order to allow a more optimal  position of  the toe.  The wounds were irrigated copiously, and the skin closed with  nylon.  The tourniquet was released, and sterile dressings and a  posterior splint were applied.  The patient was awakened and returned to  the PACU in stable and satisfactory condition.  There were no  complications.  The patient tolerated the procedure well.      Eulas Post, MD  Electronically Signed     JPL/MEDQ  D:  01/25/2009  T:  01/26/2009  Job:  [id number]

## 2011-01-04 ENCOUNTER — Encounter: Payer: Self-pay | Admitting: Family Medicine

## 2011-01-04 ENCOUNTER — Ambulatory Visit (INDEPENDENT_AMBULATORY_CARE_PROVIDER_SITE_OTHER): Payer: Medicare Other | Admitting: Family Medicine

## 2011-01-04 DIAGNOSIS — R739 Hyperglycemia, unspecified: Secondary | ICD-10-CM

## 2011-01-04 DIAGNOSIS — R21 Rash and other nonspecific skin eruption: Secondary | ICD-10-CM | POA: Insufficient documentation

## 2011-01-04 DIAGNOSIS — Z125 Encounter for screening for malignant neoplasm of prostate: Secondary | ICD-10-CM | POA: Insufficient documentation

## 2011-01-04 DIAGNOSIS — R7309 Other abnormal glucose: Secondary | ICD-10-CM

## 2011-01-04 NOTE — Assessment & Plan Note (Signed)
Return for labs.  D/w pt.  

## 2011-01-04 NOTE — Assessment & Plan Note (Signed)
Mild SI joint pain.  D/w pt re stretching and fu prn.

## 2011-01-04 NOTE — Progress Notes (Signed)
Leg pain. Near the L SI joint.  Pain crossing the L leg over the R when getting out of shower and drying off.  Intermittent pain.    DM2 recheck due in 8/12.   Plan for PSA in 8/12 with OV a few days later.  He wanted to proceed with testing.  He is aware of change in guidelines.    R foot rash, now improving . He had h/o tick bites in past, but no no engorged ticks, FCNAV.  Rash/skin changes on R anterior ankle, after bumping it on a board.  Erythema is resolving.  No ulceration.  Occ, brief,selfresolving arm cramps but o/w feeling well.    Meds, vitals, and allergies reviewed.   ROS: See HPI.  Otherwise, noncontributory.  nad Normal rom at the hips x2 except for L SI joint pain on testing.   No weakness in the legs, gait wnl R anterior ankle with 6.5x5cm anterior well demarcated (but apparently fading per patient) blanching red area noted. Central abrasion noted (possibly from the impact with the board) but this is superficial and no ulceration noted.  Distally nv intact.  No changes on the sole of the foot.  No FB noted on the area with the erythema Skin exam noted for absence of acute changes at the prev tick bite sites.

## 2011-01-04 NOTE — Assessment & Plan Note (Addendum)
This is unlikely to be related to prev tick bites.  No rash typical for tick illness and no fevers or other signs of systemic illness. Muscle cramps are occ and self limited. D/w pt and will follow the cramps clinically as this could be related to statin.  The skin changes could be an atypical presentation of a bruise or a traumatic cellulitis, but it appears to be resolving and patient is nontoxic.  I would follow this clinically.  D/w pt and he understood.  Fu prn.  >25 min spent with face to face with patient. >50% spent counseling.

## 2011-01-04 NOTE — Assessment & Plan Note (Signed)
He wants to proceed.  He is aware of current recs.  No FH of prostate cancer.

## 2011-01-04 NOTE — Patient Instructions (Signed)
Stretch your hip as we discussed and come back for labs in 8/12 with OV a few days later.  Let me know if the rash doesn't improve or if you have fevers.  Take care.

## 2011-01-09 ENCOUNTER — Encounter: Payer: Self-pay | Admitting: Family Medicine

## 2011-01-09 DIAGNOSIS — Z789 Other specified health status: Secondary | ICD-10-CM | POA: Insufficient documentation

## 2011-02-01 ENCOUNTER — Telehealth: Payer: Self-pay | Admitting: *Deleted

## 2011-02-01 ENCOUNTER — Telehealth: Payer: Self-pay | Admitting: Family Medicine

## 2011-02-01 MED ORDER — SERTRALINE HCL 50 MG PO TABS: ORAL_TABLET | ORAL | Status: DC

## 2011-02-01 NOTE — Telephone Encounter (Signed)
His counselor is Dr. Sanda Linger. She can be reached at [phone].

## 2011-02-01 NOTE — Telephone Encounter (Signed)
See next phone note.

## 2011-02-01 NOTE — Telephone Encounter (Signed)
I called pt.  He had been in counseling.  Inc in irritability with inc in stress at home.  No SI/HI.  They had talked about zoloft.  Pt is aware of potential mood changes, ie worsening, along with sexual side effects and typical duration of treatment before effect.  Will start, he contracts for safety and he has f/u scheduled.  He'll call back as needed in the meantime.

## 2011-02-01 NOTE — Telephone Encounter (Signed)
Patient says that his counselor has suggested that he take zoloft for a little while to see if it helps at all.

## 2011-03-24 ENCOUNTER — Other Ambulatory Visit (INDEPENDENT_AMBULATORY_CARE_PROVIDER_SITE_OTHER): Payer: Medicare Other | Admitting: Family Medicine

## 2011-03-24 DIAGNOSIS — R7309 Other abnormal glucose: Secondary | ICD-10-CM

## 2011-03-24 DIAGNOSIS — R739 Hyperglycemia, unspecified: Secondary | ICD-10-CM

## 2011-03-24 DIAGNOSIS — Z125 Encounter for screening for malignant neoplasm of prostate: Secondary | ICD-10-CM

## 2011-03-29 ENCOUNTER — Encounter: Payer: Self-pay | Admitting: Family Medicine

## 2011-03-29 ENCOUNTER — Ambulatory Visit (INDEPENDENT_AMBULATORY_CARE_PROVIDER_SITE_OTHER): Payer: Medicare Other | Admitting: Family Medicine

## 2011-03-29 DIAGNOSIS — M2559 Pain in other specified joint: Secondary | ICD-10-CM

## 2011-03-29 DIAGNOSIS — R739 Hyperglycemia, unspecified: Secondary | ICD-10-CM

## 2011-03-29 DIAGNOSIS — R7309 Other abnormal glucose: Secondary | ICD-10-CM

## 2011-03-29 DIAGNOSIS — R454 Irritability and anger: Secondary | ICD-10-CM

## 2011-03-29 DIAGNOSIS — Z125 Encounter for screening for malignant neoplasm of prostate: Secondary | ICD-10-CM

## 2011-03-29 DIAGNOSIS — K5900 Constipation, unspecified: Secondary | ICD-10-CM

## 2011-03-29 DIAGNOSIS — Z711 Person with feared health complaint in whom no diagnosis is made: Secondary | ICD-10-CM

## 2011-03-29 DIAGNOSIS — E7800 Pure hypercholesterolemia, unspecified: Secondary | ICD-10-CM

## 2011-03-29 NOTE — Assessment & Plan Note (Signed)
Work on low [employer] and weight.  Recheck labs before next OV.

## 2011-03-29 NOTE — Assessment & Plan Note (Signed)
Prev with normal DRE, PSA wnl today.  Consider recheck PSA in future.

## 2011-03-29 NOTE — Assessment & Plan Note (Signed)
Shoulder pain, likely due to biceps tendonitis.  Gentle rom and activity limited by pain.  F/u prn.

## 2011-03-29 NOTE — Progress Notes (Signed)
Irritability improved on SSRI and tolerating the medicine.    Hyperglycemia, here for followup.  Labs d/w pt.  A1c elevated from prev.  He is snaking on sweets. We talked about this today.  Sugar have been ~120 in AM, fasting.   Prostate cancer screening.  Prev with normal DRE in 2012.  psa wnl and d/w pt.  See plan.  r shoulder pain, pain with certain movements.  No pop or snap.  No weakness.  Occ drainage from R ear and now with change in bowel habits.  No abd pain or blood in stool but harder stools noted.  Had increased fiber intake.    PMH and SH reviewed  ROS: See HPI, otherwise noncontributory.  Meds, vitals, and allergies reviewed.   GEN: nad, alert and oriented HEENT: mucous membranes moist, tm wnl, minimal wax in R canal, pinna wnl NECK: supple w/o LA CV: rrr. PULM: ctab, no inc wob ABD: soft, +bs, not ttp EXT: no edema r shoulder with normal ROM, no impingement, pain with int/ext rotation and AC testing neg.  Speed's pos for pain at origin of biceps.   Distally nv intact

## 2011-03-29 NOTE — Patient Instructions (Signed)
Recheck fasting labs in 1/13 before a physical.   Cut out sweets.  Range of motion exercises- R arm 'stir the pot'.  Avoid heavy lifting with R arm- activity limited by pain.  You can increase the fiber tabs as needed.  Take care.  Glad to see you.

## 2011-03-29 NOTE — Assessment & Plan Note (Signed)
Improved with SSRI, tolerated well.

## 2011-03-29 NOTE — Assessment & Plan Note (Signed)
Continue to inc fiber.  No blood in stool and prev with colonoscopy done.

## 2011-03-29 NOTE — Assessment & Plan Note (Signed)
Minimal wax in canal, reassured and f/u prn.

## 2011-07-29 ENCOUNTER — Other Ambulatory Visit: Payer: Self-pay | Admitting: Family Medicine

## 2011-08-28 ENCOUNTER — Other Ambulatory Visit: Payer: Self-pay | Admitting: Family Medicine

## 2011-08-31 ENCOUNTER — Other Ambulatory Visit: Payer: Medicare Other

## 2011-09-03 ENCOUNTER — Encounter: Payer: Medicare Other | Admitting: Family Medicine

## 2011-09-06 ENCOUNTER — Other Ambulatory Visit: Payer: Medicare Other

## 2011-09-10 ENCOUNTER — Encounter: Payer: Medicare Other | Admitting: Family Medicine

## 2011-09-21 ENCOUNTER — Other Ambulatory Visit (INDEPENDENT_AMBULATORY_CARE_PROVIDER_SITE_OTHER): Payer: Medicare Other

## 2011-09-21 ENCOUNTER — Telehealth: Payer: Self-pay | Admitting: *Deleted

## 2011-09-21 DIAGNOSIS — E7800 Pure hypercholesterolemia, unspecified: Secondary | ICD-10-CM

## 2011-09-21 DIAGNOSIS — R7309 Other abnormal glucose: Secondary | ICD-10-CM

## 2011-09-21 DIAGNOSIS — R739 Hyperglycemia, unspecified: Secondary | ICD-10-CM

## 2011-09-21 LAB — COMPREHENSIVE METABOLIC PANEL
ALT: 31 U/L (ref 0–53)
Albumin: 4 g/dL (ref 3.5–5.2)
BUN: 15 mg/dL (ref 6–23)
CO2: 30 mEq/L (ref 19–32)
Calcium: 8.9 mg/dL (ref 8.4–10.5)
Chloride: 105 mEq/L (ref 96–112)
Creatinine, Ser: 1 mg/dL (ref 0.4–1.5)
GFR: 78.77 mL/min (ref >60.00)
Potassium: 4.2 mEq/L (ref 3.5–5.1)

## 2011-09-21 LAB — LIPID PANEL
HDL: 49 mg/dL (ref >39.00)
Triglycerides: 102 mg/dL (ref 0.0–149.0)

## 2011-09-21 NOTE — Telephone Encounter (Signed)
Patient is here to get labwork and is asking to get creatinine kinase levels.  Greg Rogers was notified and she did draw the blood if you are agreeable to do the order.  Please send order to Terri or let her know if you don't want to do it.

## 2011-09-28 ENCOUNTER — Ambulatory Visit (INDEPENDENT_AMBULATORY_CARE_PROVIDER_SITE_OTHER): Payer: Medicare Other | Admitting: Family Medicine

## 2011-09-28 ENCOUNTER — Encounter: Payer: Self-pay | Admitting: Family Medicine

## 2011-09-28 VITALS — BP 122/70 | HR 60 | Temp 97.7°F | Wt 169.0 lb

## 2011-09-28 DIAGNOSIS — T148XXA Other injury of unspecified body region, initial encounter: Secondary | ICD-10-CM

## 2011-09-28 DIAGNOSIS — E785 Hyperlipidemia, unspecified: Secondary | ICD-10-CM

## 2011-09-28 DIAGNOSIS — R7309 Other abnormal glucose: Secondary | ICD-10-CM

## 2011-09-28 DIAGNOSIS — R454 Irritability and anger: Secondary | ICD-10-CM | POA: Diagnosis not present

## 2011-09-28 DIAGNOSIS — M79609 Pain in unspecified limb: Secondary | ICD-10-CM

## 2011-09-28 DIAGNOSIS — W57XXXA Bitten or stung by nonvenomous insect and other nonvenomous arthropods, initial encounter: Secondary | ICD-10-CM

## 2011-09-28 DIAGNOSIS — T148 Other injury of unspecified body region: Secondary | ICD-10-CM

## 2011-09-28 DIAGNOSIS — Z125 Encounter for screening for malignant neoplasm of prostate: Secondary | ICD-10-CM

## 2011-09-28 DIAGNOSIS — R739 Hyperglycemia, unspecified: Secondary | ICD-10-CM

## 2011-09-28 NOTE — Patient Instructions (Signed)
Let me know if you have changes on your back or if you have fevers/rash.   We can recheck PSA in 1 year.   Recheck A1c in 6 months.  Keep exercising and stay away from sweets.   Don't change your meds otherwise.

## 2011-09-28 NOTE — Progress Notes (Signed)
H/o mild hyperglycemia.  Sugar had been 120s-140s. He cut out ice cream.  He 'still has a sweet tooth'.  A1c 6.7.  D/w pt, see plan.  FH DM2.   Prev PSA wnl, 8/12.  D/w pt.  See plan.    Irritability.  Was started on sertraline.  His wife was the source of frustration.  Per patient, the source hasn't changed.  Other people have noticed an improvement. He is able to get by, "I can handle."  He agrees to continue.  No SI/HI.    Elevated Cholesterol: Using medications without problems: yes Muscle aches: occ cramps but not prolonged pain, no consistent weakness.  He was still able to ski recently.   Diet compliance:no Exercise: yes CK was normal.   Cramps better now than 2 months ago.  We agreed to continue statin.    PMH and SH reviewed  Meds, vitals, and allergies reviewed.   ROS: See HPI.  Otherwise negative.    GEN: nad, alert and oriented HEENT: mucous membranes moist NECK: supple w/o LA CV: rrr. PULM: ctab, no inc wob ABD: soft, +bs EXT: no edema SKIN: no acute rash but tick adherent on back, removed, (it had likely been there 2 days from clearing brush Sunday) L 2nd hammertoe with minimal irritation noted.

## 2011-09-29 ENCOUNTER — Encounter: Payer: Self-pay | Admitting: Family Medicine

## 2011-09-29 DIAGNOSIS — W57XXXA Bitten or stung by nonvenomous insect and other nonvenomous arthropods, initial encounter: Secondary | ICD-10-CM | POA: Insufficient documentation

## 2011-09-29 NOTE — Assessment & Plan Note (Signed)
Overall improved, cont current meds.

## 2011-09-29 NOTE — Assessment & Plan Note (Signed)
Vs diet controlled DM2.  D/w re: both.  Will recheck A1c in 6 months.  He agrees.  Continue with diet and exercise.  No new meds.

## 2011-09-29 NOTE — Assessment & Plan Note (Signed)
He'll monitor the irritation on hammertoe, per patient it is improving .

## 2011-09-29 NOTE — Assessment & Plan Note (Signed)
No sx, he'll monitor and notify me of any rash, fevers, etc.

## 2011-09-29 NOTE — Assessment & Plan Note (Signed)
He'd like to recheck PSA in 1 year.  wnl fall 2012.  We have dicussed guidelines.

## 2011-09-29 NOTE — Assessment & Plan Note (Signed)
Controlled, cont current meds.  

## 2011-10-14 DIAGNOSIS — L570 Actinic keratosis: Secondary | ICD-10-CM | POA: Diagnosis not present

## 2011-11-02 ENCOUNTER — Other Ambulatory Visit: Payer: Self-pay | Admitting: Family Medicine

## 2011-12-15 ENCOUNTER — Other Ambulatory Visit: Payer: Self-pay | Admitting: Family Medicine

## 2011-12-20 ENCOUNTER — Other Ambulatory Visit: Payer: Self-pay | Admitting: Family Medicine

## 2011-12-20 NOTE — Telephone Encounter (Signed)
Electronic refill request

## 2011-12-21 NOTE — Telephone Encounter (Signed)
Sent!

## 2012-03-29 ENCOUNTER — Telehealth: Payer: Self-pay

## 2012-03-29 NOTE — Telephone Encounter (Signed)
Pt wants to know if 03/31/12 appt at 9 am is to see the doctor or lab test. Advised pt lab test scheduled 03/31/12. Pt understood.

## 2012-03-31 ENCOUNTER — Encounter: Payer: Self-pay | Admitting: *Deleted

## 2012-03-31 ENCOUNTER — Other Ambulatory Visit (INDEPENDENT_AMBULATORY_CARE_PROVIDER_SITE_OTHER): Payer: Medicare Other

## 2012-03-31 DIAGNOSIS — R739 Hyperglycemia, unspecified: Secondary | ICD-10-CM

## 2012-03-31 DIAGNOSIS — R7309 Other abnormal glucose: Secondary | ICD-10-CM

## 2012-04-06 ENCOUNTER — Ambulatory Visit
Admission: RE | Admit: 2012-04-06 | Discharge: 2012-04-06 | Disposition: A | Discharge status: Home / Self Care | Payer: Medicare Other | Source: Ambulatory Visit | Attending: Family Medicine | Admitting: Family Medicine

## 2012-04-06 ENCOUNTER — Ambulatory Visit (INDEPENDENT_AMBULATORY_CARE_PROVIDER_SITE_OTHER): Payer: Medicare Other | Admitting: Family Medicine

## 2012-04-06 ENCOUNTER — Encounter: Payer: Self-pay | Admitting: Family Medicine

## 2012-04-06 ENCOUNTER — Telehealth: Payer: Self-pay | Admitting: Family Medicine

## 2012-04-06 VITALS — BP 118/70 | HR 76 | Temp 97.8°F | Wt 168.0 lb

## 2012-04-06 DIAGNOSIS — N453 Epididymo-orchitis: Secondary | ICD-10-CM

## 2012-04-06 DIAGNOSIS — N508 Other specified disorders of male genital organs: Secondary | ICD-10-CM

## 2012-04-06 DIAGNOSIS — N5089 Other specified disorders of the male genital organs: Secondary | ICD-10-CM

## 2012-04-06 MED ORDER — AMOXICILLIN-POT CLAVULANATE 875-125 MG PO TABS: 1.0000 | ORAL_TABLET | Freq: Two times a day (BID) | ORAL | Status: AC

## 2012-04-06 NOTE — Patient Instructions (Addendum)
REFERRAL: GO THE THE FRONT ROOM AT THE ENTRANCE OF OUR CLINIC, NEAR CHECK IN. ASK FOR [name]. SHE WILL HELP YOU SET UP YOUR REFERRAL. DATE: TIME:  

## 2012-04-06 NOTE — Telephone Encounter (Signed)
Caller: Sharrieff/Patient; Patient Name: Greg Rogers; PCP: Crawford Givens Clelia Croft); Best Callback Phone Number: [phone].  Patient calling about new onset swelling in the Left posterior scrotum.  Pain onset 04/05/12 PM while showering.  States swelling is grape-sized.  Afebrile.  Complains of Right hip area x 4 weeks, but the scrotal pain is new.  Per protocol, disposition ED/See Within 1 hour; first available appointment in office is 1045 04/06/12 with Dr. Patsy Lager.  Appointment scheduled; info to office for staff/provider review per office protocol.

## 2012-04-06 NOTE — Progress Notes (Signed)
Nature conservation officer at [facility] [address] [place] [address] Phone: [phone] Fax: [phone]  Date:  04/06/2012   Name:  Greg Rogers   DOB:  October 29, 1940   MRN:  [id number] Gender: male  Age: 71 y.o.  PCP:  Crawford Givens, MD    Chief Complaint: Knot in left scrotom   History of Present Illness:  Greg Rogers is a 71 y.o. very pleasant male patient who presents with the following:  Found last night  Tenderness and not is felt in the posterior aspect of the left scrotum last night. He is mildly tender to palpation. No known history of trauma. No known sexual transmitted disease exposure. No dysuria. No pain with ejaculation. The patient has not been sexually active in approximately 2 months.  Otherwise no history that is significant. No enlarged lymph nodes.  Past Medical History, Surgical History, Social History, Family History, Problem List, Medications, and Allergies have been reviewed and updated if relevant.  Current Outpatient Prescriptions on File Prior to Visit  Medication Sig Dispense Refill  . aspirin 81 MG EC tablet Take 2 tablets by mouth daily.       Marland Kitchen glucose blood (ACCU-CHEK AVIVA PLUS) test strip Check blood sugar daily.       . Lancets (ACCU-CHEK MULTICLIX) lancets TEST EVERY DAY  102 each  3  . metroNIDAZOLE (METROGEL) 1 % gel Apply to affected area two times a day.       . Multiple Vitamin (MULTIVITAMIN) tablet Take 1 tablet every 3 days.       Marland Kitchen sertraline (ZOLOFT) 50 MG tablet TAKE 1 TABLET (50 MG TOTAL) BY MOUTH DAILY.  90 tablet  3  . simvastatin (ZOCOR) 10 MG tablet TAKE 1 TAB BY MOUTH AT BEDTIME  90 tablet  2    Review of Systems:  GEN: No acute illnesses, no fevers, chills. GI: No n/v/d, eating normally Pulm: No SOB Interactive and getting along well at home.  Otherwise, ROS is as per the HPI.   Physical Examination: Filed Vitals:   04/06/12 1055  BP: 118/70  Pulse: 76  Temp: 97.8 F (36.6 C)   Filed Vitals:   04/06/12 1055  Weight: 168 lb (76.204 kg)   There is no height on file to calculate BMI. Ideal Body Weight:     GEN: WDWN, NAD, Non-toxic, Alert & Oriented x 3 HEENT: Atraumatic, Normocephalic.  Ears and Nose: No external deformity. EXTR: No clubbing/cyanosis/edema NEURO: Normal gait.  PSYCH: Normally interactive. Conversant. Not depressed or anxious appearing.  Calm demeanor.  GU: Normal male. Right testicle is normal in appearance. Left testicle has a moderately enlarged epididymal area and a palpable cyst on the posterior aspect  Assessment and Plan:  1. Scrotal mass  US Scrotum  2. Epididymo - orchitis      I called the patient myself and reviewed his ultrasound findings. These are reassuring, and most likely consistent with epididymitis.  Placed him on 10 days of Augmentin.  US Scrotum  04/06/2012  *RADIOLOGY REPORT*  Clinical Data: Scrotal mass  ULTRASOUND OF SCROTUM  Technique:  Complete ultrasound examination of the testicles, epididymis, and other scrotal structures was performed.  Comparison:  11/20/2009  Findings:  Right testis:  4.4 x 2.3 x 3.5 cm.  Single tiny capsular calcification posterior testis.  Otherwise normal parenchymal echogenicity.  No mass identified.  Intratesticular blood flow present on color Doppler imaging.  Left testis:  4.6 x 2.2 x 3.1 cm.  Normal  homogeneous echogenicity without mass or calcification.  Intratesticular blood flow present on color Doppler imaging, increased versus right.  Right epididymis:  Multiple cysts at right epididymal head largest 12 x 8 x 10 mm.  Left epididymis:  Enlarged heterogeneous at body and tail. Hypervascularity at body and tail on color Doppler imaging.  Hydrocele:  Absent on right.  Small left hydrocele present.  Varicocele:  Absent bilaterally  No hernia seen. Pulse Doppler imaging not ordered/not performed.  IMPRESSION: No evidence of testicular mass. Mildly enlarged and heterogeneous appearing body and tail of the left  epididymis with associated hyperemia of the left testis and epididymis versus right. Findings are most consistent with left epididymo-orchitis. Small reactive left hydrocele.   Original Report Authenticated By: Lollie Marrow, M.D.     Orders Today:  Orders Placed This Encounter  Procedures  . US Scrotum    Standing Status: Future     Number of Occurrences: 1     Standing Expiration Date: 06/06/2013    WT-168/NO PREV/NO NEEDS PER OFFICE/EPIC ORDER/[facility] [employer]/[employer]    Order Specific Question:  Reason for exam:    Answer:  [facility], ultrasound scrotal mass    Order Specific Question:  Preferred imaging location?    Answer:  [facility]    Medications Today: (Includes new updates added during medication reconciliation) Meds ordered this encounter  Medications  . amoxicillin-clavulanate (AUGMENTIN) 875-125 MG per tablet    Sig: Take 1 tablet by mouth 2 (two) times daily.    Dispense:  20 tablet    Refill:  0    Medications Discontinued: There are no discontinued medications.   Hannah Beat, MD

## 2012-04-10 ENCOUNTER — Other Ambulatory Visit: Payer: Medicare Other

## 2012-05-10 ENCOUNTER — Telehealth: Payer: Self-pay

## 2012-05-10 NOTE — Telephone Encounter (Signed)
Pt request refills for Simvastatin and Sertraline sent to [facility]. Advised pt to contact [facility] and they can transfer [facility] [address] [facility]. Pt will contact [facility].

## 2012-07-03 ENCOUNTER — Ambulatory Visit: Payer: Medicare Other | Admitting: Family Medicine

## 2012-07-04 ENCOUNTER — Ambulatory Visit (INDEPENDENT_AMBULATORY_CARE_PROVIDER_SITE_OTHER): Payer: Medicare Other | Admitting: Family Medicine

## 2012-07-04 ENCOUNTER — Encounter: Payer: Self-pay | Admitting: Family Medicine

## 2012-07-04 VITALS — BP 106/70 | HR 70 | Temp 98.4°F | Wt 172.0 lb

## 2012-07-04 DIAGNOSIS — R7309 Other abnormal glucose: Secondary | ICD-10-CM | POA: Diagnosis not present

## 2012-07-04 DIAGNOSIS — Z125 Encounter for screening for malignant neoplasm of prostate: Secondary | ICD-10-CM

## 2012-07-04 DIAGNOSIS — E7800 Pure hypercholesterolemia, unspecified: Secondary | ICD-10-CM | POA: Diagnosis not present

## 2012-07-04 DIAGNOSIS — R739 Hyperglycemia, unspecified: Secondary | ICD-10-CM

## 2012-07-04 DIAGNOSIS — K219 Gastro-esophageal reflux disease without esophagitis: Secondary | ICD-10-CM | POA: Diagnosis not present

## 2012-07-04 NOTE — Progress Notes (Signed)
Started with 3-4 months ago. He had aspirated some flaky food once.  He didn't have a clear trigger.  Then he had a dry hacking cough, usually in early AM or later in PM but not always present at those times.  Still episodically going on.  No sputum.  Then starting 2 months ago, he had some chest pain with swallowing.  He would take a breath w/o SOB and the episodes would pass in a few seconds.  Now he has some occasional episodic chest pain during meals.   No FCNAVD.  No blood in stool.  No black tarry stools. Colonoscopy 2007, no h/o EGD.  No exertional CP.  He's was walking 3 days recently hunting (long distance walking) w/o pain.  No NSAIDS other than baseline ASA.  He took some prilosec a few times, episodically.  It seemed to help some.  He doesn't have clear food triggers.  He is drinking 1-2 glasses of wine a night.   Meds, vitals, and allergies reviewed.   ROS: See HPI.  Otherwise, noncontributory.   GEN: nad, alert and oriented HEENT: mucous membranes moist, OP with normal inspection NECK: supple w/o LA CV: rrr PULM: ctab, no inc wob ABD: soft, +bs, no ttp, no rebound EXT: no edema SKIN: no acute rash

## 2012-07-04 NOTE — Patient Instructions (Addendum)
I would elevate the head of your bed with a brick or two, cut back on wine, and take prilosec/omeprazole 20mg  a day for about 2-3 weeks. If you keep having trouble, then notify the clinic.

## 2012-07-05 DIAGNOSIS — K219 Gastro-esophageal reflux disease without esophagitis: Secondary | ICD-10-CM | POA: Insufficient documentation

## 2012-07-05 NOTE — Assessment & Plan Note (Signed)
Likely dx based on history.  Possible HH or esophageal spasm within dx.  No red flag sx.  Drink some etoh, at night.  Inc in weight.  Sleeping flat.  dw pt about cutting out offending agents, short course of PPI qd, and elevated head of bed.  He agrees.  Anatomy and ddx d/w pt.  F/u prn.

## 2012-07-20 DIAGNOSIS — E1139 Type 2 diabetes mellitus with other diabetic ophthalmic complication: Secondary | ICD-10-CM | POA: Diagnosis not present

## 2012-07-21 ENCOUNTER — Telehealth: Payer: Self-pay | Admitting: Family Medicine

## 2012-07-21 NOTE — Telephone Encounter (Signed)
Pt left vm requesting date of last tetanus shot.  Called pt and informed him of last tetanus booster in 2006.

## 2012-08-17 ENCOUNTER — Other Ambulatory Visit: Payer: Self-pay

## 2012-08-17 MED ORDER — SIMVASTATIN 10 MG PO TABS: 10.0000 mg | ORAL_TABLET | Freq: Every day | ORAL | Status: DC

## 2012-08-17 NOTE — Telephone Encounter (Signed)
Pt request refill simvastatin to [facility]; #90 x 0; pt notified refill x 1 and needs to schedule CPX, Linda T to schedule now.

## 2012-09-19 ENCOUNTER — Other Ambulatory Visit (INDEPENDENT_AMBULATORY_CARE_PROVIDER_SITE_OTHER): Payer: Medicare Other

## 2012-09-19 DIAGNOSIS — R7309 Other abnormal glucose: Secondary | ICD-10-CM | POA: Diagnosis not present

## 2012-09-19 DIAGNOSIS — E7800 Pure hypercholesterolemia, unspecified: Secondary | ICD-10-CM

## 2012-09-19 DIAGNOSIS — R739 Hyperglycemia, unspecified: Secondary | ICD-10-CM

## 2012-09-19 DIAGNOSIS — Z125 Encounter for screening for malignant neoplasm of prostate: Secondary | ICD-10-CM | POA: Diagnosis not present

## 2012-09-19 LAB — COMPREHENSIVE METABOLIC PANEL
AST: 18 U/L (ref 0–37)
BUN: 22 mg/dL (ref 6–23)
CO2: 28 mEq/L (ref 19–32)
Calcium: 9.1 mg/dL (ref 8.4–10.5)
Chloride: 105 mEq/L (ref 96–112)
Creatinine, Ser: 1.1 mg/dL (ref 0.4–1.5)
GFR: 68.91 mL/min (ref >60.00)

## 2012-09-19 LAB — LIPID PANEL
Cholesterol: 177 mg/dL (ref 0–200)
HDL: 37.6 mg/dL — ABNORMAL LOW (ref >39.00)
LDL Cholesterol: 104 mg/dL — ABNORMAL HIGH (ref 0–99)
Triglycerides: 179 mg/dL — ABNORMAL HIGH (ref 0.0–149.0)

## 2012-09-19 LAB — HEMOGLOBIN A1C: Hgb A1c MFr Bld: 7.3 % — ABNORMAL HIGH (ref 4.6–6.5)

## 2012-09-26 ENCOUNTER — Encounter: Payer: Self-pay | Admitting: Family Medicine

## 2012-09-26 ENCOUNTER — Ambulatory Visit (INDEPENDENT_AMBULATORY_CARE_PROVIDER_SITE_OTHER): Payer: Medicare Other | Admitting: Family Medicine

## 2012-09-26 VITALS — BP 112/70 | HR 65 | Temp 98.0°F | Ht 66.5 in | Wt 173.0 lb

## 2012-09-26 DIAGNOSIS — Z23 Encounter for immunization: Secondary | ICD-10-CM

## 2012-09-26 DIAGNOSIS — E119 Type 2 diabetes mellitus without complications: Secondary | ICD-10-CM | POA: Diagnosis not present

## 2012-09-26 DIAGNOSIS — E785 Hyperlipidemia, unspecified: Secondary | ICD-10-CM

## 2012-09-26 DIAGNOSIS — R454 Irritability and anger: Secondary | ICD-10-CM | POA: Diagnosis not present

## 2012-09-26 DIAGNOSIS — K219 Gastro-esophageal reflux disease without esophagitis: Secondary | ICD-10-CM | POA: Diagnosis not present

## 2012-09-26 DIAGNOSIS — Z0000 Encounter for general adult medical examination without abnormal findings: Secondary | ICD-10-CM

## 2012-09-26 NOTE — Progress Notes (Signed)
I have personally reviewed the Medicare Annual Wellness questionnaire and have noted 1. The patient's medical and social history 2. Their use of alcohol, tobacco or illicit drugs 3. Their current medications and supplements 4. The patient's functional ability including ADL's, fall risks, home safety risks and hearing or visual             impairment. 5. Diet and physical activities 6. Evidence for depression or mood disorders  The patients weight, height, BMI have been recorded in the chart and visual acuity is per eye clinic.  I have made referrals, counseling and provided education to the patient based review of the above and I have provided the pt with a written personalized care plan for preventive services.  See scanned forms.  Routine anticipatory guidance given to patient.  See health maintenance. Flu 10/13 Shingles done prev PNA N/A Tetanus 2014 Colonoscopy 2007 Prostate cancer screening d/w pt.  PSA wnl.  Advance directive d/w pt.  On file.   Cognitive function addressed- see scanned forms- and if abnormal then additional documentation follows.   Diabetes:  No meds.  Hypoglycemic episodes:no Hyperglycemic episodes:no Feet problems:no eye exam within last year: yes A1c increased, d/w pt about diet and exercise, cutting back carbs.   Elevated Cholesterol: Using medications without problems:yes Muscle aches: no Diet compliance: discussed Exercise:yes  GERD.  Controlled with course of PPI but sx returned after stopping.  Has been eating late at night, some wine at night.  D/w pt about options for meds.    H/o irritability.  Mood stable, sx resolved, he wants to taper off medicine.  Discussed.  Doing well at work and home.    PMH and SH reviewed  Meds, vitals, and allergies reviewed.   ROS: See HPI.  Otherwise negative.    GEN: nad, alert and oriented HEENT: mucous membranes moist NECK: supple w/o LA CV: rrr. PULM: ctab, no inc wob ABD: soft, +bs EXT: no  edema SKIN: no acute rash  Diabetic foot exam: Normal inspection No skin breakdown No calluses  Normal DP pulses Normal sensation to light touch and monofilament Nails normal

## 2012-09-26 NOTE — Patient Instructions (Signed)
Taper the sertraline.  Take 1/2 tab for the next month then try to stop.  Restart if needed.  Recheck A1c in 3 months and work on your diet in the meantime.  Try zantac/ranitidine 150mg  by mouth twice a day or you can continue the omeprazole 20mg  a day.  Take care.

## 2012-09-27 DIAGNOSIS — Z0000 Encounter for general adult medical examination without abnormal findings: Secondary | ICD-10-CM | POA: Insufficient documentation

## 2012-09-27 NOTE — Assessment & Plan Note (Signed)
Would taper SSRI to 25mg  a day for ~1 month and then off if tolerated.  He agrees. Restart if needed.  Mood is stable.  Med left on med list for now.

## 2012-09-27 NOTE — Assessment & Plan Note (Signed)
D/w pt about diet and exercise.  He'll recheck A1c in ~3 months at lab visit and we'll go from there.  Diet and weight loss would address this in meantime. Consider metformin in 3 months.

## 2012-09-27 NOTE — Assessment & Plan Note (Signed)
He can try zantac vs prilosec.  D/w pt about diet and timing of eating.

## 2012-09-27 NOTE — Assessment & Plan Note (Signed)
See scanned forms.  Routine anticipatory guidance given to patient.  See health maintenance. Flu 10/13 Shingles done prev PNA N/A Tetanus 2014 Colonoscopy 2007 Prostate cancer screening d/w pt.  PSA wnl.  Advance directive d/w pt.  On file.   Cognitive function addressed- see scanned forms- and if abnormal then additional documentation follows.

## 2012-09-27 NOTE — Assessment & Plan Note (Signed)
Continue statin, will work on diet and weight. Labs d/w pt.

## 2012-11-09 ENCOUNTER — Telehealth: Payer: Self-pay

## 2012-11-09 DIAGNOSIS — R059 Cough, unspecified: Secondary | ICD-10-CM

## 2012-11-09 DIAGNOSIS — R05 Cough: Secondary | ICD-10-CM

## 2012-11-09 NOTE — Telephone Encounter (Signed)
Called pt.  Now pt has a cough in spite of elevation of head of bed, cutting back wine and taking prilosec episodically.  Dry cough, similar to throat clearing.  No fevers.  Discussed ENT eval.  This is reasonable.  Will take prilosec nightly in meantime.  App ENT help.

## 2012-11-09 NOTE — Telephone Encounter (Addendum)
Pt request call back from Dr Para March re: f/u last exam and discomfort from acid reflux. Pt would not elaborate on note any further; just wants Dr Para March to call him back. Pt also wants new activation code for my chart; Adrienne printed letter to be mailed to pt's home with new activation code.Advised pt letter mailed today with new activation code to pts confirmed home address.

## 2012-11-20 DIAGNOSIS — R1310 Dysphagia, unspecified: Secondary | ICD-10-CM | POA: Diagnosis not present

## 2012-11-20 DIAGNOSIS — K219 Gastro-esophageal reflux disease without esophagitis: Secondary | ICD-10-CM | POA: Diagnosis not present

## 2012-11-23 ENCOUNTER — Encounter: Payer: Self-pay | Admitting: Family Medicine

## 2012-11-24 ENCOUNTER — Other Ambulatory Visit: Payer: Self-pay | Admitting: Family Medicine

## 2012-11-24 MED ORDER — SERTRALINE HCL 50 MG PO TABS: 50.0000 mg | ORAL_TABLET | Freq: Every day | ORAL | Status: DC

## 2012-12-06 ENCOUNTER — Encounter: Payer: Self-pay | Admitting: Family Medicine

## 2012-12-06 MED ORDER — GLUCOSE BLOOD VI STRP: ORAL_STRIP | Status: DC

## 2012-12-06 MED ORDER — SIMVASTATIN 10 MG PO TABS: 10.0000 mg | ORAL_TABLET | Freq: Every day | ORAL | Status: DC

## 2013-01-12 ENCOUNTER — Telehealth: Payer: Self-pay

## 2013-01-12 NOTE — Telephone Encounter (Signed)
Pt left v/m that his mychart has timed out and request call back with new activation code.

## 2013-01-12 NOTE — Telephone Encounter (Signed)
Pt notified that pt's mychart status is currently active; advised pt to call [phone] for assistance. Pt voiced understanding.-

## 2013-01-15 ENCOUNTER — Encounter: Payer: Self-pay | Admitting: Family Medicine

## 2013-01-16 ENCOUNTER — Telehealth: Payer: Self-pay | Admitting: *Deleted

## 2013-01-16 ENCOUNTER — Other Ambulatory Visit: Payer: Self-pay | Admitting: *Deleted

## 2013-01-16 MED ORDER — EPINEPHRINE 0.3 MG/0.3ML IJ SOAJ: 0.3000 mg | Freq: Once | INTRAMUSCULAR | Status: DC

## 2013-01-16 MED ORDER — GLUCOSE BLOOD VI STRP: ORAL_STRIP | Status: DC

## 2013-01-16 NOTE — Telephone Encounter (Signed)
Patient requests prescription for [facility] [address].  He is allergic to bees and is once again tending honey bee hives.  He needs this just in case of an emergency.

## 2013-01-16 NOTE — Telephone Encounter (Signed)
Sent!

## 2013-01-16 NOTE — Telephone Encounter (Deleted)
1. Prescription for test strips have [facility] [address]. 2.  A1C recommended in 3 months from last appt which was in February.  You are due now for the A1C.  Orders are in the computer.  Please phone in for a lab appointment. 3. Prescription for Epi-Pen has been requested from Dr. Duncan.  Please check with your pharmacy later in the day today or tomorrow. 

## 2013-01-16 NOTE — Telephone Encounter (Deleted)
1. Prescription for test strips [facility] [address]. 2.  A1C recommended in 3 months from last appt which was in February.  You are due now for the A1C.  Orders are in the computer.  Please phone in for a lab appointment. 3. Prescription for Epi-Pen has been requested from Dr. Para March.  Please check with your pharmacy later in the day today or tomorrow.

## 2013-01-19 ENCOUNTER — Other Ambulatory Visit (INDEPENDENT_AMBULATORY_CARE_PROVIDER_SITE_OTHER): Payer: Medicare Other

## 2013-01-19 ENCOUNTER — Other Ambulatory Visit: Payer: Self-pay

## 2013-01-19 DIAGNOSIS — E119 Type 2 diabetes mellitus without complications: Secondary | ICD-10-CM | POA: Diagnosis not present

## 2013-01-19 LAB — HEMOGLOBIN A1C: Hgb A1c MFr Bld: 7.4 % — ABNORMAL HIGH (ref 4.6–6.5)

## 2013-01-19 MED ORDER — GLUCOSE BLOOD VI STRP: ORAL_STRIP | Status: DC

## 2013-01-19 NOTE — Telephone Encounter (Signed)
Greg Rogers with [facility] [address] request aviva test strips rx be resent with dx code for ins. 250.00 added and resent.

## 2013-01-22 ENCOUNTER — Other Ambulatory Visit: Payer: Self-pay | Admitting: Family Medicine

## 2013-01-22 ENCOUNTER — Encounter: Payer: Self-pay | Admitting: Family Medicine

## 2013-01-22 ENCOUNTER — Other Ambulatory Visit: Payer: Self-pay | Admitting: *Deleted

## 2013-01-22 DIAGNOSIS — E119 Type 2 diabetes mellitus without complications: Secondary | ICD-10-CM

## 2013-01-22 MED ORDER — METFORMIN HCL 500 MG PO TABS: 500.0000 mg | ORAL_TABLET | Freq: Every day | ORAL | Status: DC

## 2013-01-23 ENCOUNTER — Telehealth: Payer: Self-pay | Admitting: Family Medicine

## 2013-01-23 NOTE — Telephone Encounter (Signed)
See pt note about the test strips and clarify with pharmacy.  Thanks.

## 2013-01-23 NOTE — Telephone Encounter (Signed)
Opened in error

## 2013-01-23 NOTE — Telephone Encounter (Signed)
Done

## 2013-02-09 DIAGNOSIS — D235 Other benign neoplasm of skin of trunk: Secondary | ICD-10-CM | POA: Diagnosis not present

## 2013-02-09 DIAGNOSIS — L719 Rosacea, unspecified: Secondary | ICD-10-CM | POA: Diagnosis not present

## 2013-02-09 DIAGNOSIS — L570 Actinic keratosis: Secondary | ICD-10-CM | POA: Diagnosis not present

## 2013-02-09 DIAGNOSIS — D234 Other benign neoplasm of skin of scalp and neck: Secondary | ICD-10-CM | POA: Diagnosis not present

## 2013-02-09 DIAGNOSIS — D485 Neoplasm of uncertain behavior of skin: Secondary | ICD-10-CM | POA: Diagnosis not present

## 2013-02-14 ENCOUNTER — Encounter: Payer: Self-pay | Admitting: Family Medicine

## 2013-02-22 ENCOUNTER — Other Ambulatory Visit: Payer: Self-pay

## 2013-02-23 ENCOUNTER — Encounter: Payer: Self-pay | Admitting: Family Medicine

## 2013-03-12 ENCOUNTER — Encounter: Payer: Self-pay | Admitting: Family Medicine

## 2013-03-12 ENCOUNTER — Ambulatory Visit (INDEPENDENT_AMBULATORY_CARE_PROVIDER_SITE_OTHER): Payer: Medicare Other | Admitting: Family Medicine

## 2013-03-12 VITALS — BP 147/84 | HR 62 | Ht 67.0 in | Wt 167.0 lb

## 2013-03-12 DIAGNOSIS — Z5189 Encounter for other specified aftercare: Secondary | ICD-10-CM | POA: Diagnosis not present

## 2013-03-12 DIAGNOSIS — IMO0002 Reserved for concepts with insufficient information to code with codable children: Secondary | ICD-10-CM | POA: Diagnosis not present

## 2013-03-12 DIAGNOSIS — S76312A Strain of muscle, fascia and tendon of the posterior muscle group at thigh level, left thigh, initial encounter: Secondary | ICD-10-CM

## 2013-03-12 NOTE — Patient Instructions (Addendum)
You have strained your hamstring, likely Grade 2. Heat 15 minutes at a time 3-4 times a day for spasms and to help your body resorb the bruising. Ibuprofen 600 or 800mg  three times a day with food as needed. Start hamstring curls and swings I showed you - 3 sets of 10 once a day. Add 2 pound ankle weight when this becomes too easy. In 1 week start running lunge as well 3 sets of 10. Consider formal physical therapy. Avoid hills, lunges, squatting as much as possible except with exercises noted above. Compression sleeve or ACE wrap for support - wear except when sleeping, bathing. Follow up with me in 2 weeks for reevaluation.

## 2013-03-13 ENCOUNTER — Encounter: Payer: Self-pay | Admitting: Family Medicine

## 2013-03-13 NOTE — Progress Notes (Signed)
Patient ID: Greg Rogers, male   DOB: 11-Oct-1940, 72 y.o.   MRN: [id number]  PCP: Crawford Givens, MD  Subjective:   HPI: Patient is a 72 y.o. male here for left hamstring injury.  Patient reports on 7/22 he was waterskiing. Went to stand up and felt a pop in left hamstring area. + swelling and bruising. Has been icing, using ibuprofen and wrapping with an ACE wrap. Able to hike the day after while using a cane. Gardened on Sunday and had more soreness with this. No prior hamstring injuries.  Past Medical History  Diagnosis Date  . 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 Rosacea   . Arthritis   . Nephrolithiasis   . Hyperglycemia   . Snoring   . Persistent disorder of initiating or maintaining sleep   . Hyperlipidemia   . Pes planus   . Foot pain     Bilateral  . Abnormal GGT test     With otherwise normal work up.  . Diabetes mellitus without complication     Current Outpatient Prescriptions on File Prior to Visit  Medication Sig Dispense Refill  . aspirin 81 MG EC tablet Take 2 tablets by mouth daily.       . simvastatin (ZOCOR) 10 MG tablet Take 1 tablet (10 mg total) by mouth at bedtime.  90 tablet  3  . EPINEPHrine (EPIPEN) 0.3 mg/0.3 mL DEVI Inject 0.3 mLs (0.3 mg total) into the muscle once.  2 Device  1  . glucose blood (ACCU-CHEK AVIVA PLUS) test strip Use to check blood sugar once daily.  Diagnosis:  250.00  50 each  11  . Lancets (ACCU-CHEK MULTICLIX) lancets TEST EVERY DAY  102 each  3  . metFORMIN (GLUCOPHAGE) 500 MG tablet Take 1 tablet (500 mg total) by mouth daily with breakfast.  90 tablet  3  . metroNIDAZOLE (METROGEL) 1 % gel Apply to affected area two times a day.       . Multiple Vitamin (MULTIVITAMIN) tablet Take 1 tablet every 3 days.       Marland Kitchen omeprazole (PRILOSEC) 20 MG capsule Take 20 mg by mouth daily.      . sertraline (ZOLOFT) 50 MG tablet Take 1 tablet (50 mg total) by mouth daily.  90 tablet  3   No current facility-administered  medications on file prior to visit.    Past Surgical History  Procedure Laterality Date  . Foot surgery  01/2009    Left  . Appendectomy  1949    at age 34  . Hernia repair  1956    at age 58 (single)  . Hernia repair  1960    (double)  . Cyst removed from breast  1960  . Tonsillectomy and adenoidectomy  ~ 1950  . Vasectomy  ~ 1984    Allergies  Allergen Reactions  . Bee Venom   . Erythromycin     Oral ulcers (but tolerates zithromax)  . Pneumococcal Vaccine Polyvalent     REACTION: rash    History   Social History  . Marital Status: Married    Spouse Name: N/A    Number of Children: 4  . Years of Education: N/A   Occupational History  . 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 Works part time at [employer] job.    Social History Main Topics  . Smoking status: Never Smoker   .  Smokeless tobacco: Former Neurosurgeon  . Alcohol Use: 3.0 oz/week    6 drink(s) per week     Comment: Wine, 8 glasses a week on average.  . Drug Use: No  . Sexually Active: Not on file   Other Topics Concern  . Not on file   Social History Narrative   Education:  Licensed conveyancer at [employer], part time at [employer] now.   4 kids, 2 are MD's   [facility] and stays active walking dog, enjoys skiing, very active, walks at least 1/2 mile daily    Family History  Problem Relation Age of Onset  . Heart disease Father 85    First MI  . Hyperlipidemia Father   . Heart attack Father   . Diabetes Sister     DM2  . Hyperlipidemia Sister   . Arthritis Other   . Diabetes Other   . Prostate cancer Neg Hx   . Colon cancer Neg Hx   . Diabetes Brother   . Hyperlipidemia Brother   . Hypertension Brother     BP 147/84  Pulse 62  Ht 5\' 7"  (1.702 m)  Wt 167 lb (75.751 kg)  BMI 26.15 kg/m2  Review of Systems: See HPI above.    Objective:  Physical Exam:  Gen: NAD  L leg: Mod swelling and bruising posterior thigh.  No other deformity.   No palpable defect. TTP midportion of hamstring medially.  No other TTP lower leg. FROM hip and knee. Pain and 4/5 strength with resisted knee flexion at 30 degrees.  5-/5 strength at 90 degrees. NVI distally.    Assessment & Plan:  1. Left hamstring strain - grade 2.  Start with compression sleeve, home exercise program as demonstrated today.  Consider formal physical therapy as well.  Ibuprofen, heat 15 minutes at a time 3-4 times a day.  Wait 1 week to add running lunge to rehab protocol.  F/u in 2 weeks for reevaluation.

## 2013-03-14 DIAGNOSIS — S76312A Strain of muscle, fascia and tendon of the posterior muscle group at thigh level, left thigh, initial encounter: Secondary | ICD-10-CM | POA: Insufficient documentation

## 2013-03-14 NOTE — Assessment & Plan Note (Signed)
grade 2.  Start with compression sleeve, home exercise program as demonstrated today.  Consider formal physical therapy as well.  Ibuprofen, heat 15 minutes at a time 3-4 times a day.  Wait 1 week to add running lunge to rehab protocol.  F/u in 2 weeks for reevaluation.

## 2013-03-26 ENCOUNTER — Encounter: Payer: Self-pay | Admitting: Family Medicine

## 2013-03-26 ENCOUNTER — Ambulatory Visit (INDEPENDENT_AMBULATORY_CARE_PROVIDER_SITE_OTHER): Payer: Medicare Other | Admitting: Family Medicine

## 2013-03-26 VITALS — BP 129/78 | HR 59 | Ht 67.0 in | Wt 165.0 lb

## 2013-03-26 DIAGNOSIS — Z5189 Encounter for other specified aftercare: Secondary | ICD-10-CM | POA: Diagnosis not present

## 2013-03-26 DIAGNOSIS — S76312D Strain of muscle, fascia and tendon of the posterior muscle group at thigh level, left thigh, subsequent encounter: Secondary | ICD-10-CM

## 2013-03-26 DIAGNOSIS — IMO0002 Reserved for concepts with insufficient information to code with codable children: Secondary | ICD-10-CM | POA: Diagnosis not present

## 2013-03-26 NOTE — Patient Instructions (Addendum)
Use ibuprofen, heat only as needed at this point. Sleeve only when you're going to be going on a long walk over the next 4 weeks - otherwise you can stop using it. Continue hamstring curls, swings, running lunges 3 sets of 10 once a day for next 4 weeks. After this do these exercises only 2-3 times a week for 6 weeks then stop. Consider adding the jumping lunge and bounding exercises in 2 weeks (though this is not necessary). Follow up with me as needed.

## 2013-03-27 ENCOUNTER — Encounter: Payer: Self-pay | Admitting: Family Medicine

## 2013-03-27 NOTE — Progress Notes (Signed)
Patient ID: Greg Rogers, male   DOB: 1941/03/24, 72 y.o.   MRN: [id number]  PCP: Crawford Givens, MD  Subjective:   HPI: Patient is a 72 y.o. male here for f/u left hamstring injury.  7/28: Patient reports on 7/22 he was waterskiing. Went to stand up and felt a pop in left hamstring area. + swelling and bruising. Has been icing, using ibuprofen and wrapping with an ACE wrap. Able to hike the day after while using a cane. Gardened on Sunday and had more soreness with this. No prior hamstring injuries.  8/11: Patient reports he is doing much better at this point. Able to walk daily as he usually does without pain. At worst pain is up to a 1/10. Doing home exercises. Not requiring any medicines now for pain. Compression sleeve helped him a great deal.  Past Medical History  Diagnosis Date  . 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 Rosacea   . Arthritis   . Nephrolithiasis   . Hyperglycemia   . Snoring   . Persistent disorder of initiating or maintaining sleep   . Hyperlipidemia   . Pes planus   . Foot pain     Bilateral  . Abnormal GGT test     With otherwise normal work up.  . Diabetes mellitus without complication     Current Outpatient Prescriptions on File Prior to Visit  Medication Sig Dispense Refill  . aspirin 81 MG EC tablet Take 2 tablets by mouth daily.       Marland Kitchen EPINEPHrine (EPIPEN) 0.3 mg/0.3 mL DEVI Inject 0.3 mLs (0.3 mg total) into the muscle once.  2 Device  1  . glucose blood (ACCU-CHEK AVIVA PLUS) test strip Use to check blood sugar once daily.  Diagnosis:  250.00  50 each  11  . Lancets (ACCU-CHEK MULTICLIX) lancets TEST EVERY DAY  102 each  3  . metFORMIN (GLUCOPHAGE) 500 MG tablet Take 1 tablet (500 mg total) by mouth daily with breakfast.  90 tablet  3  . metroNIDAZOLE (METROGEL) 1 % gel Apply to affected area two times a day.       . Multiple Vitamin (MULTIVITAMIN) tablet Take 1 tablet every 3 days.       Marland Kitchen omeprazole (PRILOSEC) 20  MG capsule Take 20 mg by mouth daily.      . sertraline (ZOLOFT) 50 MG tablet Take 1 tablet (50 mg total) by mouth daily.  90 tablet  3  . simvastatin (ZOCOR) 10 MG tablet Take 1 tablet (10 mg total) by mouth at bedtime.  90 tablet  3   No current facility-administered medications on file prior to visit.    Past Surgical History  Procedure Laterality Date  . Foot surgery  01/2009    Left  . Appendectomy  1949    at age 55  . Hernia repair  1956    at age 7 (single)  . Hernia repair  1960    (double)  . Cyst removed from breast  1960  . Tonsillectomy and adenoidectomy  ~ 1950  . Vasectomy  ~ 1984    Allergies  Allergen Reactions  . Bee Venom   . Erythromycin     Oral ulcers (but tolerates zithromax)  . Pneumococcal Vaccine Polyvalent     REACTION: rash    History   Social History  . Marital Status: Married    Spouse Name: N/A    Number of Children: 4  . Years of Education:  N/A   Occupational History  . 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 Works part time at [employer] job.    Social History Main Topics  . Smoking status: Never Smoker   . Smokeless tobacco: Former Neurosurgeon  . Alcohol Use: 3.0 oz/week    6 drink(s) per week     Comment: Wine, 8 glasses a week on average.  . Drug Use: No  . Sexually Active: Not on file   Other Topics Concern  . Not on file   Social History Narrative   Education:  Licensed conveyancer at [employer], part time at [employer] now.   4 kids, 2 are MD's   [employer] and stays active walking dog, enjoys skiing, very active, walks at least 1/2 mile daily    Family History  Problem Relation Age of Onset  . Heart disease Father 82    First MI  . Hyperlipidemia Father   . Heart attack Father   . Diabetes Sister     DM2  . Hyperlipidemia Sister   . Arthritis Other   . Diabetes Other   . Prostate cancer Neg Hx   . Colon cancer Neg Hx   . Diabetes Brother   . Hyperlipidemia Brother    . Hypertension Brother     BP 129/78  Pulse 59  Ht 5\' 7"  (1.702 m)  Wt 165 lb (74.844 kg)  BMI 25.84 kg/m2  Review of Systems: See HPI above.    Objective:  Physical Exam:  Gen: NAD  L leg: Minimal swelling posterior thigh.  No other deformity.  No palpable defect. No longer with TTP midportion of hamstring medially.  No other TTP lower leg. FROM hip and knee. 'Feels it' but minimal pain and 5-/5 strength with resisted knee flexion at 30 degrees.  5/5 strength at 90 degrees. NVI distally.    Assessment & Plan:  1. Left hamstring strain - grade 2.  Much improved following 2 weeks of home rehab, compression sleeve.  Continue home exercises regularly for next 4 weeks then 2-3 times a week for next 6 weeks.  Compression sleeve at this point only with long walking, exercise until 6 weeks out.  Tylenol, icing/heat as needed.  F/u prn.

## 2013-03-27 NOTE — Assessment & Plan Note (Signed)
grade 2.  Much improved following 2 weeks of home rehab, compression sleeve.  Continue home exercises regularly for next 4 weeks then 2-3 times a week for next 6 weeks.  Compression sleeve at this point only with long walking, exercise until 6 weeks out.  Tylenol, icing/heat as needed.  F/u prn.

## 2013-04-03 ENCOUNTER — Encounter: Payer: Self-pay | Admitting: Family Medicine

## 2013-04-04 ENCOUNTER — Other Ambulatory Visit: Payer: Self-pay | Admitting: Family Medicine

## 2013-04-04 MED ORDER — NITROGLYCERIN 0.4 MG SL SUBL: 0.4000 mg | SUBLINGUAL_TABLET | SUBLINGUAL | Status: DC | PRN

## 2013-04-25 ENCOUNTER — Other Ambulatory Visit (INDEPENDENT_AMBULATORY_CARE_PROVIDER_SITE_OTHER): Payer: Medicare Other

## 2013-04-25 DIAGNOSIS — E119 Type 2 diabetes mellitus without complications: Secondary | ICD-10-CM | POA: Diagnosis not present

## 2013-04-25 LAB — HEMOGLOBIN A1C: Hgb A1c MFr Bld: 7.2 % — ABNORMAL HIGH (ref 4.6–6.5)

## 2013-04-30 ENCOUNTER — Ambulatory Visit (INDEPENDENT_AMBULATORY_CARE_PROVIDER_SITE_OTHER): Payer: Medicare Other | Admitting: Family Medicine

## 2013-04-30 ENCOUNTER — Encounter: Payer: Self-pay | Admitting: Family Medicine

## 2013-04-30 VITALS — BP 122/62 | HR 62 | Temp 97.8°F | Ht 66.5 in | Wt 167.5 lb

## 2013-04-30 DIAGNOSIS — E119 Type 2 diabetes mellitus without complications: Secondary | ICD-10-CM

## 2013-04-30 DIAGNOSIS — Z0000 Encounter for general adult medical examination without abnormal findings: Secondary | ICD-10-CM | POA: Diagnosis not present

## 2013-04-30 MED ORDER — METFORMIN HCL 500 MG PO TABS: 500.0000 mg | ORAL_TABLET | Freq: Two times a day (BID) | ORAL | Status: DC

## 2013-04-30 NOTE — Progress Notes (Signed)
Diabetes:  Using medications without difficulties:yes Hypoglycemic episodes:no Hyperglycemic episodes:no Feet problems:no Blood Sugars averaging: ~140-165 eye exam within last year:yes A1c d/w pt.  7.2.    Flu shot to be done at work.   He saw Dr. Pearletha Forge and he is improved.  He was complimentary of Dr. Lazaro Arms care.    Recently with a toothache.  Prev with root canal on the tooth, recently with infection there, now done amoxil and temporary tooth intervention.  No pain now.    PMH and SH reviewed  Meds, vitals, and allergies reviewed.   ROS: See HPI.  Otherwise negative.    GEN: nad, alert and oriented NECK: supple w/o LA CV: rrr. PULM: ctab, no inc wob ABD: soft, +bs EXT: no edema SKIN: no acute rash

## 2013-04-30 NOTE — Patient Instructions (Addendum)
Increase the metformin to 1+1.  If sugar isn't lower after about 2 weeks, increase to 2+1.   Schedule a physical in mid Feb 2015.   We'll do labs ahead of time.  Take care.   I would get a flu shot each fall.

## 2013-05-01 NOTE — Assessment & Plan Note (Signed)
Increase the metformin to 1+1. If sugar isn't lower after about 2 weeks, increase to 2+1.  Schedule a physical in mid Feb 2015. We'll do labs ahead of time.  We can check MALB depending on DM2 control and BP at the CPE.  He agrees.   Continue work on diet and exercise.

## 2013-05-22 ENCOUNTER — Other Ambulatory Visit: Payer: Self-pay | Admitting: *Deleted

## 2013-05-22 ENCOUNTER — Encounter: Payer: Self-pay | Admitting: Family Medicine

## 2013-05-22 MED ORDER — METFORMIN HCL 500 MG PO TABS: ORAL_TABLET | ORAL | Status: DC

## 2013-07-23 DIAGNOSIS — E119 Type 2 diabetes mellitus without complications: Secondary | ICD-10-CM | POA: Diagnosis not present

## 2013-10-28 ENCOUNTER — Other Ambulatory Visit: Payer: Self-pay | Admitting: Family Medicine

## 2013-11-19 ENCOUNTER — Encounter: Payer: Self-pay | Admitting: Family Medicine

## 2013-11-21 ENCOUNTER — Telehealth: Payer: Self-pay | Admitting: *Deleted

## 2013-11-21 ENCOUNTER — Other Ambulatory Visit (INDEPENDENT_AMBULATORY_CARE_PROVIDER_SITE_OTHER): Payer: Medicare Other

## 2013-11-21 DIAGNOSIS — Z125 Encounter for screening for malignant neoplasm of prostate: Secondary | ICD-10-CM

## 2013-11-21 DIAGNOSIS — E119 Type 2 diabetes mellitus without complications: Secondary | ICD-10-CM | POA: Diagnosis not present

## 2013-11-21 LAB — HEMOGLOBIN A1C: Hgb A1c MFr Bld: 6.7 % — ABNORMAL HIGH (ref 4.6–6.5)

## 2013-11-21 LAB — COMPREHENSIVE METABOLIC PANEL
ALBUMIN: 4 g/dL (ref 3.5–5.2)
ALT: 27 U/L (ref 0–53)
AST: 21 U/L (ref 0–37)
Alkaline Phosphatase: 90 U/L (ref 39–117)
BILIRUBIN TOTAL: 0.7 mg/dL (ref 0.3–1.2)
BUN: 19 mg/dL (ref 6–23)
CO2: 30 meq/L (ref 19–32)
Calcium: 9.1 mg/dL (ref 8.4–10.5)
Chloride: 103 mEq/L (ref 96–112)
Creatinine, Ser: 1 mg/dL (ref 0.4–1.5)
GFR: 81.07 mL/min (ref >60.00)
GLUCOSE: 152 mg/dL — AB (ref 70–99)
POTASSIUM: 4.6 meq/L (ref 3.5–5.1)
SODIUM: 140 meq/L (ref 135–145)
TOTAL PROTEIN: 6.5 g/dL (ref 6.0–8.3)

## 2013-11-21 LAB — LIPID PANEL
CHOLESTEROL: 157 mg/dL (ref 0–200)
HDL: 39.8 mg/dL (ref >39.00)
LDL Cholesterol: 94 mg/dL (ref 0–99)
Total CHOL/HDL Ratio: 4
Triglycerides: 117 mg/dL (ref 0.0–149.0)
VLDL: 23.4 mg/dL (ref 0.0–40.0)

## 2013-11-21 LAB — PSA, MEDICARE: PSA: 1.1 ng/ml (ref 0.10–4.00)

## 2013-11-21 NOTE — Telephone Encounter (Signed)
Orders are in.  If he was fasting and if they can run a cmet and lipid, then run those also.  If not, let me know and I'll cancel them out.  Thanks.

## 2013-11-21 NOTE — Telephone Encounter (Signed)
Received a call from [place] at [facility] requesting an order for lab work on this patient. [place]ique stated that the patient told her that he is there for a PSA and A1C. Martinique is going to draw the lab work since the patient is there and needs an order put in for this.

## 2013-11-21 NOTE — Telephone Encounter (Signed)
Greg Rogers at the [facility] as instructed by telephone. Caren Griffins stated that she will check into this and call back and let us know if they can run the extra test or if the orders need to be cancelled.

## 2013-11-22 NOTE — Telephone Encounter (Signed)
Added orders were received and results sent.

## 2013-12-05 ENCOUNTER — Other Ambulatory Visit: Payer: Self-pay | Admitting: *Deleted

## 2013-12-05 ENCOUNTER — Other Ambulatory Visit: Payer: Self-pay | Admitting: Family Medicine

## 2013-12-05 MED ORDER — SERTRALINE HCL 50 MG PO TABS: 50.0000 mg | ORAL_TABLET | Freq: Every day | ORAL | Status: DC

## 2013-12-05 MED ORDER — METFORMIN HCL 500 MG PO TABS: ORAL_TABLET | ORAL | Status: DC

## 2013-12-07 ENCOUNTER — Encounter: Payer: Self-pay | Admitting: Family Medicine

## 2013-12-07 ENCOUNTER — Ambulatory Visit (INDEPENDENT_AMBULATORY_CARE_PROVIDER_SITE_OTHER): Payer: BLUE CROSS/BLUE SHIELD | Admitting: Family Medicine

## 2013-12-07 VITALS — BP 128/68 | HR 64 | Temp 97.7°F | Ht 66.5 in | Wt 165.5 lb

## 2013-12-07 DIAGNOSIS — Z0000 Encounter for general adult medical examination without abnormal findings: Secondary | ICD-10-CM | POA: Diagnosis not present

## 2013-12-07 DIAGNOSIS — E119 Type 2 diabetes mellitus without complications: Secondary | ICD-10-CM

## 2013-12-07 DIAGNOSIS — E785 Hyperlipidemia, unspecified: Secondary | ICD-10-CM

## 2013-12-07 LAB — MICROALBUMIN / CREATININE URINE RATIO
CREATININE, U: 116.8 mg/dL
Microalb Creat Ratio: 0.1 mg/g (ref 0.0–30.0)
Microalb, Ur: 0.1 mg/dL (ref 0.0–1.9)

## 2013-12-07 NOTE — Patient Instructions (Signed)
Go to the lab on the way out.  We'll contact you with your lab report.  Take care.  Recheck in about 6 months.   Keep exercising and cut back on sweets.   Glad to see you.

## 2013-12-07 NOTE — Progress Notes (Signed)
Pre visit review using our clinic review tool, if applicable. No additional management support is needed unless otherwise documented below in the visit note.  I have personally reviewed the Medicare Annual Wellness questionnaire and have noted 1. The patient's medical and social history 2. Their use of alcohol, tobacco or illicit drugs 3. Their current medications and supplements 4. The patient's functional ability including ADL's, fall risks, home safety risks and hearing or visual             impairment. 5. Diet and physical activities 6. Evidence for depression or mood disorders  The patients weight, height, BMI have been recorded in the chart and visual acuity is per eye clinic.  I have made referrals, counseling and provided education to the patient based review of the above and I have provided the pt with a written personalized care plan for preventive services.  See scanned forms.  Routine anticipatory guidance given to patient.  See health maintenance. Flu 2014 Shingles prev done PNA 2005 Tetanus 2014 Colonoscopy 2007 Prostate cancer screening - PSA wnl 2015 Advance directive- daughter Angelique Blonder designated if patient were incapacitated.  Cognitive function addressed- see scanned forms- and if abnormal then additional documentation follows.   Diabetes:  Using medications without difficulties:yes Hypoglycemic episodes:no Hyperglycemic episodes:no Feet problems:no eye exam within last year:yes Sugars 130-160 at home.   Elevated Cholesterol: Using medications without problems:yes Muscle aches: no, occ cramping but this isn't diffuse Diet compliance:yes Exercise:yes  PMH and SH reviewed  Meds, vitals, and allergies reviewed.   ROS: See HPI.  Otherwise negative.    GEN: nad, alert and oriented HEENT: mucous membranes moist NECK: supple w/o LA CV: rrr. PULM: ctab, no inc wob ABD: soft, +bs EXT: no edema SKIN: no acute rash  Diabetic foot exam: Normal  inspection No skin breakdown No calluses  Normal DP pulses Normal sensation to light touch and monofilament Nails normal

## 2013-12-09 ENCOUNTER — Encounter: Payer: Self-pay | Admitting: Family Medicine

## 2013-12-09 NOTE — Assessment & Plan Note (Signed)
Improved, continue as is.  Labs d/w pt, along with diet and exercise.

## 2013-12-09 NOTE — Assessment & Plan Note (Signed)
Improved, continue as is.

## 2013-12-09 NOTE — Assessment & Plan Note (Signed)
  See scanned forms.  Routine anticipatory guidance given to patient.  See health maintenance. Flu 2014 Shingles prev done PNA 2005 Tetanus 2014 Colonoscopy 2007 Prostate cancer screening - PSA wnl 2015 Advance directive- daughter Angelique Blonder designated if patient were incapacitated.  Cognitive function addressed- see scanned forms- and if abnormal then additional documentation follows.

## 2014-02-25 ENCOUNTER — Encounter: Payer: Self-pay | Admitting: Family Medicine

## 2014-02-26 ENCOUNTER — Other Ambulatory Visit: Payer: Self-pay | Admitting: Family Medicine

## 2014-02-26 DIAGNOSIS — E119 Type 2 diabetes mellitus without complications: Secondary | ICD-10-CM

## 2014-02-27 ENCOUNTER — Other Ambulatory Visit: Payer: Self-pay | Admitting: Family Medicine

## 2014-02-27 MED ORDER — GLUCOSE BLOOD VI STRP: ORAL_STRIP | Status: DC

## 2014-03-05 ENCOUNTER — Other Ambulatory Visit: Payer: Self-pay | Admitting: *Deleted

## 2014-03-05 ENCOUNTER — Other Ambulatory Visit (INDEPENDENT_AMBULATORY_CARE_PROVIDER_SITE_OTHER): Payer: Medicare Other

## 2014-03-05 ENCOUNTER — Encounter: Payer: Self-pay | Admitting: Family Medicine

## 2014-03-05 ENCOUNTER — Other Ambulatory Visit: Payer: Self-pay | Admitting: Family Medicine

## 2014-03-05 DIAGNOSIS — E119 Type 2 diabetes mellitus without complications: Secondary | ICD-10-CM | POA: Diagnosis not present

## 2014-03-05 LAB — HEMOGLOBIN A1C: HEMOGLOBIN A1C: 7 % — AB (ref 4.6–6.5)

## 2014-03-05 MED ORDER — METFORMIN HCL 500 MG PO TABS: ORAL_TABLET | ORAL | Status: DC

## 2014-03-06 ENCOUNTER — Encounter: Payer: Self-pay | Admitting: Family Medicine

## 2014-03-06 ENCOUNTER — Other Ambulatory Visit: Payer: Self-pay | Admitting: Family Medicine

## 2014-03-06 DIAGNOSIS — E119 Type 2 diabetes mellitus without complications: Secondary | ICD-10-CM

## 2014-05-14 DIAGNOSIS — L821 Other seborrheic keratosis: Secondary | ICD-10-CM | POA: Diagnosis not present

## 2014-05-14 DIAGNOSIS — L570 Actinic keratosis: Secondary | ICD-10-CM | POA: Diagnosis not present

## 2014-05-14 DIAGNOSIS — L819 Disorder of pigmentation, unspecified: Secondary | ICD-10-CM | POA: Diagnosis not present

## 2014-05-14 DIAGNOSIS — I781 Nevus, non-neoplastic: Secondary | ICD-10-CM | POA: Diagnosis not present

## 2014-05-28 ENCOUNTER — Telehealth: Payer: Self-pay

## 2014-05-28 NOTE — Telephone Encounter (Signed)
Pt left v/m wanting to know if pt could obtain a used constant monitoring blood sugar instrument; if purchased new they are very expensive. Pt request cb.

## 2014-05-29 ENCOUNTER — Encounter: Payer: Self-pay | Admitting: Family Medicine

## 2014-05-29 NOTE — Telephone Encounter (Signed)
Patient notified as instructed by telephone. 

## 2014-05-29 NOTE — Telephone Encounter (Signed)
I don't have one.  I don't know of anyone that has one.  These are usually only used in rare circumstances and with an A1c of 7 it would never be approved.  He would have to pay out of pocket, if he could find one.

## 2014-05-31 ENCOUNTER — Encounter: Payer: Self-pay | Admitting: Family Medicine

## 2014-07-01 ENCOUNTER — Other Ambulatory Visit: Payer: Medicare Other

## 2014-07-04 ENCOUNTER — Ambulatory Visit: Payer: Medicare Other | Admitting: Family Medicine

## 2014-07-15 ENCOUNTER — Other Ambulatory Visit (INDEPENDENT_AMBULATORY_CARE_PROVIDER_SITE_OTHER): Payer: Medicare Other

## 2014-07-15 DIAGNOSIS — E119 Type 2 diabetes mellitus without complications: Secondary | ICD-10-CM

## 2014-07-15 LAB — HEMOGLOBIN A1C: HEMOGLOBIN A1C: 7.7 % — AB (ref 4.6–6.5)

## 2014-07-16 LAB — HM DIABETES EYE EXAM

## 2014-07-18 ENCOUNTER — Ambulatory Visit (INDEPENDENT_AMBULATORY_CARE_PROVIDER_SITE_OTHER): Payer: Medicare Other | Admitting: Family Medicine

## 2014-07-18 ENCOUNTER — Encounter: Payer: Self-pay | Admitting: Family Medicine

## 2014-07-18 VITALS — BP 122/80 | HR 65 | Temp 98.0°F | Wt 164.2 lb

## 2014-07-18 DIAGNOSIS — E119 Type 2 diabetes mellitus without complications: Secondary | ICD-10-CM

## 2014-07-18 MED ORDER — METFORMIN HCL 500 MG PO TABS: 1000.0000 mg | ORAL_TABLET | Freq: Two times a day (BID) | ORAL | Status: DC

## 2014-07-18 NOTE — Progress Notes (Signed)
Pre visit review using our clinic review tool, if applicable. No additional management support is needed unless otherwise documented below in the visit note.  Diabetes:  Using medications without difficulties:yes Hypoglycemic episodes:no Hyperglycemic episodes:no Feet problems: no Blood Sugars averaging: 140-170 in AM eye exam within last year: yes A1c up to 7.7. D/w pt.  He went on a big hunting trip for a few weeks and that affected his diet.   Meds, vitals, and allergies reviewed.   ROS: See HPI.  Otherwise negative.    GEN: nad, alert and oriented HEENT: mucous membranes moist NECK: supple w/o LA CV: rrr. PULM: ctab, no inc wob ABD: soft, +bs EXT: no edema SKIN: no acute rash  Diabetic foot exam: Normal inspection No skin breakdown Calluse noted on R 1st toe Normal DP pulses Normal sensation to light touch and monofilament Nails normal

## 2014-07-18 NOTE — Patient Instructions (Addendum)
Increase the metformin for 4 tabs a day and recheck in about 3 months, labs ahead of time.  Glad to see you.  Keep exercising. Take care.

## 2014-07-18 NOTE — Assessment & Plan Note (Signed)
He'll work on diet and exercise, dw pt.  Inc metformin to 2000mg  a day. Recheck in about 3 months.  He agrees.

## 2014-07-24 DIAGNOSIS — H2513 Age-related nuclear cataract, bilateral: Secondary | ICD-10-CM | POA: Diagnosis not present

## 2014-07-24 DIAGNOSIS — H25013 Cortical age-related cataract, bilateral: Secondary | ICD-10-CM | POA: Diagnosis not present

## 2014-07-24 DIAGNOSIS — E119 Type 2 diabetes mellitus without complications: Secondary | ICD-10-CM | POA: Diagnosis not present

## 2014-08-02 ENCOUNTER — Encounter: Payer: Self-pay | Admitting: Family Medicine

## 2014-08-06 ENCOUNTER — Encounter: Payer: Self-pay | Admitting: Family Medicine

## 2014-10-07 ENCOUNTER — Other Ambulatory Visit: Payer: Self-pay | Admitting: *Deleted

## 2014-10-07 ENCOUNTER — Encounter: Payer: Self-pay | Admitting: Family Medicine

## 2014-10-07 MED ORDER — ACCU-CHEK MULTICLIX LANCETS MISC: Status: DC

## 2014-10-07 MED ORDER — GLUCOSE BLOOD VI STRP: ORAL_STRIP | Status: DC

## 2014-11-14 ENCOUNTER — Telehealth: Payer: Self-pay | Admitting: Family Medicine

## 2014-11-14 DIAGNOSIS — E119 Type 2 diabetes mellitus without complications: Secondary | ICD-10-CM

## 2014-11-14 DIAGNOSIS — Z125 Encounter for screening for malignant neoplasm of prostate: Secondary | ICD-10-CM

## 2014-11-14 NOTE — Telephone Encounter (Signed)
Pt schedule cpx through my chart for 4/11 with dr Damita Dunnings.  He would like to get his labs done in [place]. Need order in system

## 2014-11-15 NOTE — Telephone Encounter (Signed)
Patient advised.

## 2014-11-15 NOTE — Telephone Encounter (Signed)
Orders are in   Thanks

## 2014-11-19 ENCOUNTER — Other Ambulatory Visit (INDEPENDENT_AMBULATORY_CARE_PROVIDER_SITE_OTHER): Payer: Medicare Other

## 2014-11-19 DIAGNOSIS — E119 Type 2 diabetes mellitus without complications: Secondary | ICD-10-CM

## 2014-11-19 DIAGNOSIS — Z125 Encounter for screening for malignant neoplasm of prostate: Secondary | ICD-10-CM

## 2014-11-19 LAB — COMPREHENSIVE METABOLIC PANEL
ALT: 24 U/L (ref 0–53)
AST: 19 U/L (ref 0–37)
Albumin: 4.1 g/dL (ref 3.5–5.2)
Alkaline Phosphatase: 58 U/L (ref 39–117)
BILIRUBIN TOTAL: 0.5 mg/dL (ref 0.2–1.2)
BUN: 18 mg/dL (ref 6–23)
CHLORIDE: 105 meq/L (ref 96–112)
CO2: 30 mEq/L (ref 19–32)
Calcium: 9.4 mg/dL (ref 8.4–10.5)
Creatinine, Ser: 1.07 mg/dL (ref 0.40–1.50)
GFR: 72.19 mL/min (ref >60.00)
Glucose, Bld: 155 mg/dL — ABNORMAL HIGH (ref 70–99)
POTASSIUM: 4.9 meq/L (ref 3.5–5.1)
Sodium: 140 mEq/L (ref 135–145)
TOTAL PROTEIN: 6.2 g/dL (ref 6.0–8.3)

## 2014-11-19 LAB — LIPID PANEL
Cholesterol: 160 mg/dL (ref 0–200)
HDL: 43.6 mg/dL (ref >39.00)
LDL Cholesterol: 85 mg/dL (ref 0–99)
NonHDL: 116.4
TRIGLYCERIDES: 156 mg/dL — AB (ref 0.0–149.0)
Total CHOL/HDL Ratio: 4
VLDL: 31.2 mg/dL (ref 0.0–40.0)

## 2014-11-19 LAB — PSA: PSA: 1.28 ng/mL (ref 0.10–4.00)

## 2014-11-19 LAB — HEMOGLOBIN A1C: Hgb A1c MFr Bld: 6.8 % — ABNORMAL HIGH (ref 4.6–6.5)

## 2014-11-19 LAB — MICROALBUMIN / CREATININE URINE RATIO
Creatinine,U: 196.8 mg/dL
Microalb Creat Ratio: 0.4 mg/g (ref 0.0–30.0)
Microalb, Ur: <0.7 mg/dL (ref 0.0–1.9)

## 2014-11-25 ENCOUNTER — Encounter: Payer: Self-pay | Admitting: Family Medicine

## 2014-11-25 ENCOUNTER — Ambulatory Visit (INDEPENDENT_AMBULATORY_CARE_PROVIDER_SITE_OTHER): Payer: Medicare Other | Admitting: Family Medicine

## 2014-11-25 VITALS — BP 122/70 | HR 63 | Temp 98.0°F | Ht 66.5 in | Wt 161.0 lb

## 2014-11-25 DIAGNOSIS — M25512 Pain in left shoulder: Secondary | ICD-10-CM | POA: Diagnosis not present

## 2014-11-25 DIAGNOSIS — E119 Type 2 diabetes mellitus without complications: Secondary | ICD-10-CM | POA: Diagnosis not present

## 2014-11-25 DIAGNOSIS — Z0000 Encounter for general adult medical examination without abnormal findings: Secondary | ICD-10-CM

## 2014-11-25 DIAGNOSIS — E785 Hyperlipidemia, unspecified: Secondary | ICD-10-CM

## 2014-11-25 DIAGNOSIS — Z7189 Other specified counseling: Secondary | ICD-10-CM

## 2014-11-25 NOTE — Patient Instructions (Signed)
Use the shoulder exercises.  If not better, then I want you to see Dr. Lorelei Pont.  Recheck in about 6 months, A1c before a visit.  Take care.  Glad to see you.

## 2014-11-25 NOTE — Progress Notes (Signed)
Pre visit review using our clinic review tool, if applicable. No additional management support is needed unless otherwise documented below in the visit note.  I have personally reviewed the Medicare Annual Wellness questionnaire and have noted 1. The patient's medical and social history 2. Their use of alcohol, tobacco or illicit drugs 3. Their current medications and supplements 4. The patient's functional ability including ADL's, fall risks, home safety risks and hearing or visual             impairment. 5. Diet and physical activities 6. Evidence for depression or mood disorders  The patients weight, height, BMI have been recorded in the chart and visual acuity is per eye clinic.  I have made referrals, counseling and provided education to the patient based review of the above and I have provided the pt with a written personalized care plan for preventive services.  Provider list updated- see scanned forms.  Routine anticipatory guidance given to patient.  See health maintenance.  Flu done 05/2014 per patient at work Shingles prev done PNA N/A given his hx, d/w pt.  Tetanus 2014 Colonoscopy 2007 Prostate cancer screening- PSA wnl 2016 Advance directive-daughter Angelique Blonder designated if patient were incapacitated. Cognitive function addressed- see scanned forms- and if abnormal then additional documentation follows.   Fell skiing in 10/2014.  Landed on L shoulder.  Pain since then, worse laying on L side at night.  Some pain with ROM.  Getting better slowly.  No eval before now.    Irritated skin noted by patient on back, patch of skin just to L of midline in midback, longstanding, no treatment yet per patient.   Prev with 2 small ticks removed. Not engorged, no new aches fevers or rash o/w.  The rash above predates the ticks.    Diabetes:  Using medications without difficulties:yes Hypoglycemic episodes:no Hyperglycemic episodes:no Feet problems:no Blood Sugars averaging:  ~120-140 in AM eye exam within last year: yes, ~07/2014 A1c trend d/w pt, improved.   Elevated Cholesterol: Using medications without problems:yes Muscle aches: no Diet compliance:yes Exercise:yes Labs d/w pt.  Minimal elevation in TG.   PMH and SH reviewed  Meds, vitals, and allergies reviewed.   ROS: See HPI.  Otherwise negative.    GEN: nad, alert and oriented HEENT: mucous membranes moist NECK: supple w/o LA CV: rrr. PULM: ctab, no inc wob ABD: soft, +bs EXT: no edema SKIN: no acute rash CN 2-12 wnl B, S/S/DTR wnl x4 L shoulder with normal int/ext rotation but pain on supraspinatus testing.  Slightly tender on testing AC joint.  No arm drop.  Distally NV intact.   Diabetic foot exam: Normal inspection No skin breakdown Calluses noted Normal DP pulses Normal sensation to light touch and monofilament Nails normal

## 2014-11-26 DIAGNOSIS — M25519 Pain in unspecified shoulder: Secondary | ICD-10-CM | POA: Insufficient documentation

## 2014-11-26 DIAGNOSIS — Z7189 Other specified counseling: Secondary | ICD-10-CM | POA: Insufficient documentation

## 2014-11-26 NOTE — Assessment & Plan Note (Signed)
Continue statin, D&E, minimal elevation in TG.  D/w pt.

## 2014-11-26 NOTE — Assessment & Plan Note (Signed)
Likely combination of AC and cuff irritation.  D/w pt.  Would use home cuff exercises, handout d/w pt.  Slowly improving in the meantime.  If not continuing to improve, will ask patient to call back and see Dr. Lorelei Pont.  He agrees.  No need for imaging now.

## 2014-11-26 NOTE — Assessment & Plan Note (Signed)
Flu done 05/2014 per patient at work  Shingles prev done  PNA N/A given his hx, d/w pt.  Tetanus 2014  Colonoscopy 2007  Prostate cancer screening- PSA wnl 2016  Advance directive-daughter Angelique Blonder designated if patient were incapacitated.  Cognitive function addressed- see scanned forms- and if abnormal then additional documentation follows.

## 2014-11-26 NOTE — Assessment & Plan Note (Signed)
A1c improved, no change in meds.  Continue D&E.  He agrees.  Recheck in about 6 months.

## 2014-12-09 DIAGNOSIS — L570 Actinic keratosis: Secondary | ICD-10-CM | POA: Diagnosis not present

## 2014-12-09 DIAGNOSIS — L219 Seborrheic dermatitis, unspecified: Secondary | ICD-10-CM | POA: Diagnosis not present

## 2014-12-31 ENCOUNTER — Other Ambulatory Visit: Payer: Self-pay | Admitting: Family Medicine

## 2014-12-31 ENCOUNTER — Encounter: Payer: Self-pay | Admitting: Family Medicine

## 2014-12-31 MED ORDER — DOXYCYCLINE HYCLATE 100 MG PO TABS: 100.0000 mg | ORAL_TABLET | Freq: Two times a day (BID) | ORAL | Status: DC

## 2015-02-19 ENCOUNTER — Encounter: Payer: Self-pay | Admitting: Family Medicine

## 2015-02-20 ENCOUNTER — Encounter: Payer: Self-pay | Admitting: Family Medicine

## 2015-03-12 DIAGNOSIS — L820 Inflamed seborrheic keratosis: Secondary | ICD-10-CM | POA: Diagnosis not present

## 2015-03-12 DIAGNOSIS — L738 Other specified follicular disorders: Secondary | ICD-10-CM | POA: Diagnosis not present

## 2015-04-03 ENCOUNTER — Other Ambulatory Visit: Payer: Self-pay | Admitting: Family Medicine

## 2015-04-03 NOTE — Telephone Encounter (Signed)
Electronic refill request, pt had a CPE on 11/25/14, both meds last prescribed on 03/05/14 #90 with 3 additional refills, please advise

## 2015-04-03 NOTE — Telephone Encounter (Signed)
Sent. Thanks.   

## 2015-05-16 ENCOUNTER — Encounter: Payer: Self-pay | Admitting: Family Medicine

## 2015-05-16 MED ORDER — GLUCOSE BLOOD VI STRP: ORAL_STRIP | Status: DC

## 2015-05-16 NOTE — Addendum Note (Signed)
Addended by: Modena Nunnery on: 05/16/2015 02:06 PM   Modules accepted: Orders

## 2015-05-19 ENCOUNTER — Encounter: Payer: Self-pay | Admitting: Family Medicine

## 2015-05-21 ENCOUNTER — Encounter: Payer: Self-pay | Admitting: Family Medicine

## 2015-05-21 DIAGNOSIS — Z23 Encounter for immunization: Secondary | ICD-10-CM | POA: Diagnosis not present

## 2015-05-22 ENCOUNTER — Encounter: Payer: Self-pay | Admitting: Family Medicine

## 2015-05-22 ENCOUNTER — Ambulatory Visit (INDEPENDENT_AMBULATORY_CARE_PROVIDER_SITE_OTHER): Payer: Medicare Other | Admitting: Family Medicine

## 2015-05-22 VITALS — BP 128/76 | HR 60 | Temp 98.3°F | Wt 158.2 lb

## 2015-05-22 DIAGNOSIS — Z8249 Family history of ischemic heart disease and other diseases of the circulatory system: Secondary | ICD-10-CM | POA: Diagnosis not present

## 2015-05-22 DIAGNOSIS — E119 Type 2 diabetes mellitus without complications: Secondary | ICD-10-CM

## 2015-05-22 NOTE — Patient Instructions (Addendum)
We'll contact you with your lab report. Greg Rogers will call about your referral. Don't change your meds for now.  Take care.  Glad to see you.

## 2015-05-22 NOTE — Progress Notes (Signed)
Pre visit review using our clinic review tool, if applicable. No additional management support is needed unless otherwise documented below in the visit note.  3 episodes in the last few weeks with L shoulder pain.  One AM he work up with pain.  He attributed it to a MSK source.  He has some palpable muscle tenderness in the L trap and L deltoid region.   One night/early AM (pre waking) he had a sweat but that was not concurrent the pain above.  He had another episode with a sweat that was atypical when he was outside.   He is travelling next month.   His younger brother had a stent placed this week.   Father had MI at 61.  He has mult sibs with heart disease.    No chest pain, not SOB.  Exercise tolerance is still at baseline, with walking.  No BLE edema.  Complaint with meds.    Dm2.  Sugar has been ~120-140 recently.  Usually had been controlled with last A1c <7. Usually compliant with meds.  Due for f/u A1c.    Meds, vitals, and allergies reviewed.   ROS: See HPI.  Otherwise, noncontributory.  GEN: nad, alert and oriented HEENT: mucous membranes moist NECK: supple w/o LA CV: rrr. PULM: ctab, no inc wob ABD: soft, +bs EXT: no edema He has some palpable muscle tenderness in the L trap and L deltoid region.

## 2015-05-23 ENCOUNTER — Encounter: Payer: Self-pay | Admitting: *Deleted

## 2015-05-23 DIAGNOSIS — Z8249 Family history of ischemic heart disease and other diseases of the circulatory system: Secondary | ICD-10-CM | POA: Insufficient documentation

## 2015-05-23 LAB — HEMOGLOBIN A1C: Hgb A1c MFr Bld: 6.8 % — ABNORMAL HIGH (ref 4.6–6.5)

## 2015-05-23 NOTE — Assessment & Plan Note (Signed)
Sig FH noted and he'll be travelling out [place] for an extended time in about 1 month.  It is reasonable given his FH to refer to cards for consideration of stress testing, though his most recent sx are atypical.   Continue risk factor modification with diet/exercise/current meds.  Still okay for outpatient f/u.  EKG reviewed, sinus brady, wnl o/w.  D/w pt at OV.  He agrees with plan. >25 minutes spent in face to face time with patient, >50% spent in counselling or coordination of care.

## 2015-05-23 NOTE — Assessment & Plan Note (Signed)
See notes on f/u labs.  

## 2015-06-03 ENCOUNTER — Encounter: Payer: Self-pay | Admitting: Family Medicine

## 2015-06-03 DIAGNOSIS — E782 Mixed hyperlipidemia: Secondary | ICD-10-CM | POA: Diagnosis not present

## 2015-06-03 DIAGNOSIS — E119 Type 2 diabetes mellitus without complications: Secondary | ICD-10-CM | POA: Diagnosis not present

## 2015-06-03 DIAGNOSIS — R0789 Other chest pain: Secondary | ICD-10-CM | POA: Diagnosis not present

## 2015-06-23 ENCOUNTER — Other Ambulatory Visit: Payer: Self-pay

## 2015-06-23 MED ORDER — METFORMIN HCL 500 MG PO TABS: 1000.0000 mg | ORAL_TABLET | Freq: Two times a day (BID) | ORAL | Status: DC

## 2015-06-23 NOTE — Telephone Encounter (Signed)
Pt is traveling to [facility] and request refill called [facility] [phone] per protocol, pt notified done;pt seen 05/22/2015.

## 2015-07-04 ENCOUNTER — Ambulatory Visit (INDEPENDENT_AMBULATORY_CARE_PROVIDER_SITE_OTHER): Payer: Medicare Other | Admitting: Family Medicine

## 2015-07-04 ENCOUNTER — Encounter: Payer: Self-pay | Admitting: Family Medicine

## 2015-07-04 VITALS — BP 122/72 | HR 55 | Temp 97.8°F | Ht 66.5 in | Wt 159.1 lb

## 2015-07-04 DIAGNOSIS — M545 Low back pain, unspecified: Secondary | ICD-10-CM

## 2015-07-04 DIAGNOSIS — K5900 Constipation, unspecified: Secondary | ICD-10-CM

## 2015-07-04 DIAGNOSIS — M25512 Pain in left shoulder: Secondary | ICD-10-CM

## 2015-07-04 NOTE — Patient Instructions (Signed)
Likely rotator cuff irritation and lower back strain.  Restart rotator cuff exercises and gently stretch your back.  Ice your shoulder, ice vs heat on your lower back.  Ibuprofen 200-600mg  up to 3 times a day with food, use the lowest dose possible.   Colace as needed (once a day if needed) for hard stools.  Update me about the arm symptoms when you stand, should they continue Take care.  Glad to see you.

## 2015-07-04 NOTE — Progress Notes (Signed)
Pre visit review using our clinic review tool, if applicable. No additional management support is needed unless otherwise documented below in the visit note.  Just got back from a trip out [place].  Was hunting out [place], a lot of walking and lifting.  Had some pain before the trip, got worse during the trip.  A little better today.  Had been taking ibuprofen 400-600mg  occ, with some relief today.  No radiation in legs.  No weakness, no B/B sx. R lower back, can occ come across the midline.  No radicular pain.  Has tried heat in the AM, not otherwise.  No rash.  No bruising, no injury.    About 1.5 months ago, he had an episode of constipation.  Has been intermittent in the meantime. No blood in stool, no red flag sx.    Some L shoulder pain, ttp, posterior L glenohumeral area.  H/o rotator cuff pathology prev.   He has note a burning sensation with stretching upward, from the hands down through his trunk and down to his legs.  Bilateral.  Not lightheaded.  Noted first about 2-3 weeks ago.    Meds, vitals, and allergies reviewed.   ROS: See HPI.  Otherwise, noncontributory.  GEN: nad, alert and oriented HEENT: mucous membranes moist NECK: supple w/o LA CV: rrr PULM: ctab, no inc wob He didn't have the same sensation at the OV with reaching B overhead (ie the burning sensation) when standing or laying down.  He'll monitor that and update me.   L lower back ttp w/o bruising or rash.  Pain with forward flexion.  No midline pain.  S/S wnl BLE L arm w/o arm drop.  No pain on int/ext rotation but supraspinatus testing weaker on L side than R.

## 2015-07-06 DIAGNOSIS — M549 Dorsalgia, unspecified: Secondary | ICD-10-CM | POA: Insufficient documentation

## 2015-07-06 DIAGNOSIS — K5900 Constipation, unspecified: Secondary | ICD-10-CM | POA: Insufficient documentation

## 2015-07-06 NOTE — Assessment & Plan Note (Signed)
Likely benign strain, resolving, see AVS.  He agrees.  No imaging needed.

## 2015-07-06 NOTE — Assessment & Plan Note (Signed)
D/w pt.  Restart rotator cuff exercises, see AVS, update me as needed.

## 2015-07-06 NOTE — Assessment & Plan Note (Signed)
Colace as needed (once a day if needed) for hard stools. F/u prn.

## 2015-07-21 ENCOUNTER — Encounter: Payer: Self-pay | Admitting: Family Medicine

## 2015-07-30 ENCOUNTER — Encounter: Payer: Self-pay | Admitting: Family Medicine

## 2015-07-30 DIAGNOSIS — Z0100 Encounter for examination of eyes and vision without abnormal findings: Secondary | ICD-10-CM | POA: Diagnosis not present

## 2015-07-30 DIAGNOSIS — E119 Type 2 diabetes mellitus without complications: Secondary | ICD-10-CM | POA: Diagnosis not present

## 2015-07-30 DIAGNOSIS — H25013 Cortical age-related cataract, bilateral: Secondary | ICD-10-CM | POA: Diagnosis not present

## 2015-07-30 DIAGNOSIS — H2513 Age-related nuclear cataract, bilateral: Secondary | ICD-10-CM | POA: Diagnosis not present

## 2015-07-30 LAB — HM DIABETES EYE EXAM

## 2015-08-21 ENCOUNTER — Encounter: Payer: Self-pay | Admitting: Family Medicine

## 2015-09-16 ENCOUNTER — Other Ambulatory Visit: Payer: Self-pay | Admitting: Family Medicine

## 2015-10-29 ENCOUNTER — Encounter: Payer: Self-pay | Admitting: Family Medicine

## 2015-12-17 ENCOUNTER — Ambulatory Visit (INDEPENDENT_AMBULATORY_CARE_PROVIDER_SITE_OTHER): Payer: Medicare Other

## 2015-12-17 VITALS — BP 130/74 | HR 60 | Temp 97.9°F | Ht 66.5 in | Wt 154.5 lb

## 2015-12-17 DIAGNOSIS — Z0000 Encounter for general adult medical examination without abnormal findings: Secondary | ICD-10-CM

## 2015-12-17 DIAGNOSIS — E785 Hyperlipidemia, unspecified: Secondary | ICD-10-CM | POA: Diagnosis not present

## 2015-12-17 DIAGNOSIS — Z125 Encounter for screening for malignant neoplasm of prostate: Secondary | ICD-10-CM | POA: Diagnosis not present

## 2015-12-17 DIAGNOSIS — R7309 Other abnormal glucose: Secondary | ICD-10-CM

## 2015-12-17 LAB — COMPREHENSIVE METABOLIC PANEL
ALBUMIN: 4.2 g/dL (ref 3.5–5.2)
ALK PHOS: 69 U/L (ref 39–117)
ALT: 21 U/L (ref 0–53)
AST: 17 U/L (ref 0–37)
BILIRUBIN TOTAL: 0.5 mg/dL (ref 0.2–1.2)
BUN: 17 mg/dL (ref 6–23)
CALCIUM: 9.5 mg/dL (ref 8.4–10.5)
CO2: 31 meq/L (ref 19–32)
CREATININE: 0.93 mg/dL (ref 0.40–1.50)
Chloride: 104 mEq/L (ref 96–112)
GFR: 84.62 mL/min (ref >60.00)
Glucose, Bld: 135 mg/dL — ABNORMAL HIGH (ref 70–99)
Potassium: 4.6 mEq/L (ref 3.5–5.1)
SODIUM: 141 meq/L (ref 135–145)
TOTAL PROTEIN: 6.2 g/dL (ref 6.0–8.3)

## 2015-12-17 LAB — PSA, MEDICARE: PSA: 1.11 ng/ml (ref 0.10–4.00)

## 2015-12-17 LAB — HEMOGLOBIN A1C: Hgb A1c MFr Bld: 7.1 % — ABNORMAL HIGH (ref 4.6–6.5)

## 2015-12-17 LAB — MICROALBUMIN / CREATININE URINE RATIO
CREATININE, U: 149.8 mg/dL
Microalb Creat Ratio: 0.5 mg/g (ref 0.0–30.0)

## 2015-12-17 NOTE — Progress Notes (Signed)
Pre visit review using our clinic review tool, if applicable. No additional management support is needed unless otherwise documented below in the visit note. 

## 2015-12-17 NOTE — Progress Notes (Signed)
I reviewed health advisor's note, was available for consultation, and agree with documentation and plan.  

## 2015-12-17 NOTE — Progress Notes (Signed)
PCP notes:  Patient wants to discuss the following with you:  1) burning sensation to right side when stretching arms upward  2) tenderness to left knee  OV30 scheduled for 12/22/15 @ 1415. Patient advised to fast for lipid panel and to expect a foot exam, if necessary.

## 2015-12-17 NOTE — Patient Instructions (Signed)
Mr. Sato , Thank you for taking time to come for your Medicare Wellness Visit. I appreciate your ongoing commitment to your health goals. Please review the following plan we discussed and let me know if I can assist you in the future.   These are the goals we discussed: Goals    None      This is a list of the screening recommended for you and due dates:  Health Maintenance  Topic Date Due  . Complete foot exam   12/22/2015*  . Flu Shot  03/16/2016  . Colon Cancer Screening  04/27/2016  . Hemoglobin A1C  06/18/2016  . Eye exam for diabetics  07/29/2016  . Urine Protein Check  12/16/2016  . Tetanus Vaccine  09/26/2022  . Shingles Vaccine  Addressed  *Topic was postponed. The date shown is not the original due date.    Preventive Care for Adults  A healthy lifestyle and preventive care can promote health and wellness. Preventive health guidelines for adults include the following key practices.  . A routine yearly physical is a good way to check with your health care provider about your health and preventive screening. It is a chance to share any concerns and updates on your health and to receive a thorough exam.  . Visit your dentist for a routine exam and preventive care every 6 months. Brush your teeth twice a day and floss once a day. Good oral hygiene prevents tooth decay and gum disease.  . The frequency of eye exams is based on your age, health, family medical history, use  of contact lenses, and other factors. Follow your health care provider's ecommendations for frequency of eye exams.  . Eat a healthy diet. Foods like vegetables, fruits, whole grains, low-fat dairy products, and lean protein foods contain the nutrients you need without too many calories. Decrease your intake of foods high in solid fats, added sugars, and salt. Eat the right amount of calories for you. Get information about a proper diet from your health care provider, if necessary.  . Regular physical  exercise is one of the most important things you can do for your health. Most adults should get at least 150 minutes of moderate-intensity exercise (any activity that increases your heart rate and causes you to sweat) each week. In addition, most adults need muscle-strengthening exercises on 2 or more days a week.  Silver Sneakers may be a benefit available to you. To determine eligibility, you may visit the website: www.silversneakers.com or contact program at 2702157981 Mon-Fri between 8AM-8PM.   . Maintain a healthy weight. The body mass index (BMI) is a screening tool to identify possible weight problems. It provides an estimate of body fat based on height and weight. Your health care provider can find your BMI and can help you achieve or maintain a healthy weight.   For adults 20 years and older: ? A BMI below 18.5 is considered underweight. ? A BMI of 18.5 to 24.9 is normal. ? A BMI of 25 to 29.9 is considered overweight. ? A BMI of 30 and above is considered obese.   . Maintain normal blood lipids and cholesterol levels by exercising and minimizing your intake of saturated fat. Eat a balanced diet with plenty of fruit and vegetables. Blood tests for lipids and cholesterol should begin at age 73 and be repeated every 5 years. If your lipid or cholesterol levels are high, you are over 50, or you are at high risk for heart disease, you  may need your cholesterol levels checked more frequently. Ongoing high lipid and cholesterol levels should be treated with medicines if diet and exercise are not working.  . If you smoke, find out from your health care provider how to quit. If you do not use tobacco, please do not start.  . If you choose to drink alcohol, please do not consume more than 2 drinks per day. One drink is considered to be 12 ounces (355 mL) of beer, 5 ounces (148 mL) of wine, or 1.5 ounces (44 mL) of liquor.  . If you are 42-30 years old, ask your health care provider if you  should take aspirin to prevent strokes.  . Use sunscreen. Apply sunscreen liberally and repeatedly throughout the day. You should seek shade when your shadow is shorter than you. Protect yourself by wearing long sleeves, pants, a wide-brimmed hat, and sunglasses year round, whenever you are outdoors.  . Once a month, do a whole body skin exam, using a mirror to look at the skin on your back. Tell your health care provider of new moles, moles that have irregular borders, moles that are larger than a pencil eraser, or moles that have changed in shape or color.

## 2015-12-17 NOTE — Progress Notes (Signed)
Subjective:   Greg Hendler. is a 75 y.o. male who presents for Medicare Annual/Subsequent preventive examination.  Review of Systems:  N/A Cardiac Risk Factors include: advanced age (>49men, >5 women);dyslipidemia;male gender;diabetes mellitus     Objective:    Vitals: BP 130/74 mmHg  Pulse 60  Temp(Src) 97.9 F (36.6 C) (Oral)  Ht 5' 6.5" (1.689 m)  Wt 154 lb 8 oz (70.081 kg)  BMI 24.57 kg/m2  SpO2 98%  Body mass index is 24.57 kg/(m^2).  Tobacco History  Smoking status  . Never Smoker   Smokeless tobacco  . Former Engineer, structural given: No   Past Medical History  Diagnosis Date  . 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 Rosacea   . Arthritis   . Nephrolithiasis   . Hyperglycemia   . Snoring   . Persistent disorder of initiating or maintaining sleep   . Hyperlipidemia   . Pes planus   . Foot pain     Bilateral  . Abnormal GGT test     With otherwise normal work up.  . Diabetes mellitus without complication [facility])    Past Surgical History  Procedure Laterality Date  . Foot surgery  01/2009    Left  . Appendectomy  1949    at age 14  . Hernia repair  1956    at age 89 (single)  . Hernia repair  1960    (double)  . Cyst removed from breast  1960  . Tonsillectomy and adenoidectomy  ~ 1950  . Vasectomy  ~ 1984   Family History  Problem Relation Age of Onset  . Heart disease Father 55    First MI  . Hyperlipidemia Father   . Heart attack Father   . Diabetes Sister     DM2  . Hyperlipidemia Sister   . Arthritis Other   . Diabetes Other   . Prostate cancer Neg Hx   . Colon cancer Neg Hx   . Diabetes Brother   . Hyperlipidemia Brother   . Hypertension Brother   . Heart disease Brother     CAD s/p stent  . CAD Brother    History  Sexual Activity  . Sexual Activity: No    Outpatient Encounter Prescriptions as of 12/17/2015  Medication Sig  . ACCU-CHEK FASTCLIX LANCETS MISC Use as instructed to check blood sugar once  daily.  Diagnosis:  E11.9  Non-Insulin dependent.  Marland Kitchen aspirin 81 MG EC tablet Take 81 mg by mouth daily.   Marland Kitchen EPINEPHrine (EPIPEN) 0.3 mg/0.3 mL DEVI Inject 0.3 mLs (0.3 mg total) into the muscle once.  Marland Kitchen glucose blood (ACCU-CHEK AVIVA PLUS) test strip Use to check blood sugar once daily.  Diagnosis:  E11.9   Non-insulin dependent.  . metFORMIN (GLUCOPHAGE) 500 MG tablet Take 2 tablets by mouth  twice a day with meals  . Multiple Vitamin (MULTIVITAMIN) tablet Take 1 tablet by mouth daily.   . nitroGLYCERIN (NITROSTAT) 0.4 MG SL tablet Place 1 tablet (0.4 mg total) under the tongue every 5 (five) minutes as needed for chest pain (max 3 doses/15 min.call MD/to ER if used).  Marland Kitchen omeprazole (PRILOSEC) 20 MG capsule Take 20 mg by mouth daily as needed. As needed  . psyllium (REGULOID) 0.52 G capsule Take 0.52 g by mouth 2 (two) times daily.  . sertraline (ZOLOFT) 50 MG tablet Take 1 tablet by mouth  daily  . simvastatin (ZOCOR) 10 MG tablet Take 1  tablet by mouth at  bedtime   No facility-administered encounter medications on file as of 12/17/2015.    Activities of Daily Living In your present state of health, do you have any difficulty performing the following activities: 12/17/2015  Hearing? N  Vision? N  Difficulty concentrating or making decisions? Y  Walking or climbing stairs? N  Dressing or bathing? N  Doing errands, shopping? N  Preparing Food and eating ? N  Using the Toilet? N  In the past six months, have you accidently leaked urine? N  Do you have problems with loss of bowel control? N  Managing your Medications? N  Managing your Finances? N  Housekeeping or managing your Housekeeping? N    Patient Care Team: Tonia Ghent, MD as PCP - General Ralene Bathe, MD as Consulting Physician (Ophthalmology) Nicoletta Ba as Consulting Physician (Dentistry)   Assessment:     Hearing Screening   125Hz  250Hz  500Hz  1000Hz  2000Hz  4000Hz  8000Hz   Right ear:   40 40 40 0   Left ear:    40 40 40 0   Vision Screening Comments: Last eye exam in Jan 2017   Exercise Activities and Dietary recommendations Current Exercise Habits: Home exercise routine, Type of exercise: walking;Other - see comments (gardening), Time (Minutes): 25, Frequency (Times/Week): 7, Weekly Exercise (Minutes/Week): 175, Intensity: Mild, Exercise limited by: None identified  Fall Risk Fall Risk  12/17/2015 11/25/2014 12/07/2013 09/26/2012  Falls in the past year? No No No No   Depression Screen PHQ 2/9 Scores 12/17/2015 11/25/2014 12/07/2013 09/26/2012  PHQ - 2 Score 0 0 0 0    Cognitive Testing MMSE - Mini Mental State Exam 12/17/2015  Orientation to time 5  Orientation to Place 5  Registration 3  Attention/ Calculation 0  Recall 3  Language- name 2 objects 0  Language- repeat 1  Language- follow 3 step command 3  Language- read & follow direction 0  Write a sentence 0  Copy design 0  Total score 20   PLEASE NOTE: A Mini-Cog screen was completed. Maximum score is 20. A value of 0 denotes this part of [place]stein MMSE was not completed.  Orientation to Time - Max 5 Orientation to Place - Max 5 Registration - Max 3 Recall - Max 3 Language Repeat - Max 1 Language Follow 3 Step Command - Max 3  Immunization History  Administered Date(s) Administered  . Influenza Whole 05/21/2013  . Influenza-Unspecified 05/29/2014, 05/21/2015  . Pneumococcal Polysaccharide-23 08/17/2003  . Td 08/16/2004, 09/26/2012   Screening Tests Health Maintenance  Topic Date Due  . FOOT EXAM  12/22/2015 (Originally 11/25/2015)  . INFLUENZA VACCINE  03/16/2016  . COLONOSCOPY  04/27/2016  . HEMOGLOBIN A1C  06/18/2016  . OPHTHALMOLOGY EXAM  07/29/2016  . URINE MICROALBUMIN  12/16/2016  . TETANUS/TDAP  09/26/2022  . ZOSTAVAX  Addressed      Plan:     I have personally reviewed and addressed the Medicare Annual Wellness questionnaire and have noted the following in the patient's chart:  A. Medical and social  history B. Use of alcohol, tobacco or illicit drugs  C. Current medications and supplements D. Functional ability and status E.  Nutritional status F.  Physical activity G. Advance directives H. List of other physicians I.  Hospitalizations, surgeries, and ER visits in previous 12 months J.  [place] to include hearing, vision, cognitive, depression L. Referrals and appointments - none  In addition, I have reviewed and discussed with patient certain preventive  protocols, quality metrics, and best practice recommendations. A written personalized care plan for preventive services as well as general preventive health recommendations were provided to patient.  See attached scanned questionnaire for additional information.   Signed,   Lindell Noe, MHA, BS, LPN Health Advisor [phone]

## 2015-12-22 ENCOUNTER — Ambulatory Visit (INDEPENDENT_AMBULATORY_CARE_PROVIDER_SITE_OTHER)
Admission: RE | Admit: 2015-12-22 | Discharge: 2015-12-22 | Disposition: A | Discharge status: Home / Self Care | Payer: Medicare Other | Source: Ambulatory Visit | Attending: Family Medicine | Admitting: Family Medicine

## 2015-12-22 ENCOUNTER — Ambulatory Visit (INDEPENDENT_AMBULATORY_CARE_PROVIDER_SITE_OTHER): Payer: Medicare Other | Admitting: Family Medicine

## 2015-12-22 ENCOUNTER — Encounter: Payer: Self-pay | Admitting: Family Medicine

## 2015-12-22 VITALS — BP 142/70 | HR 55 | Temp 97.7°F | Ht 67.0 in | Wt 153.5 lb

## 2015-12-22 DIAGNOSIS — M792 Neuralgia and neuritis, unspecified: Secondary | ICD-10-CM | POA: Insufficient documentation

## 2015-12-22 DIAGNOSIS — E785 Hyperlipidemia, unspecified: Secondary | ICD-10-CM

## 2015-12-22 DIAGNOSIS — M5410 Radiculopathy, site unspecified: Secondary | ICD-10-CM

## 2015-12-22 DIAGNOSIS — R454 Irritability and anger: Secondary | ICD-10-CM

## 2015-12-22 DIAGNOSIS — E119 Type 2 diabetes mellitus without complications: Secondary | ICD-10-CM | POA: Diagnosis not present

## 2015-12-22 DIAGNOSIS — M5030 Other cervical disc degeneration, unspecified cervical region: Secondary | ICD-10-CM | POA: Diagnosis not present

## 2015-12-22 DIAGNOSIS — M5136 Other intervertebral disc degeneration, lumbar region: Secondary | ICD-10-CM | POA: Diagnosis not present

## 2015-12-22 DIAGNOSIS — M5124 Other intervertebral disc displacement, thoracic region: Secondary | ICD-10-CM | POA: Diagnosis not present

## 2015-12-22 MED ORDER — SIMVASTATIN 10 MG PO TABS: ORAL_TABLET | ORAL | Status: DC

## 2015-12-22 MED ORDER — EPINEPHRINE 0.3 MG/0.3ML IJ SOAJ: 0.3000 mg | Freq: Once | INTRAMUSCULAR | Status: AC

## 2015-12-22 MED ORDER — SERTRALINE HCL 50 MG PO TABS: ORAL_TABLET | ORAL | Status: DC

## 2015-12-22 MED ORDER — METFORMIN HCL 500 MG PO TABS: ORAL_TABLET | ORAL | Status: DC

## 2015-12-22 NOTE — Progress Notes (Signed)
Pre visit review using our clinic review tool, if applicable. No additional management support is needed unless otherwise documented below in the visit note.  Pain with R arm movement, over his head, or stretching.  No L sided pain.  Does radiate down the R arm but not past the elbow and moves down the R side of the trunk and then into the R leg, but not down to the foot; down to the R knee.  Had been going on prev but worse recently.    It took him about 1 month to get over this skiing accident but he has better now.  Still with some mild L knee tenderness but clearly getting better.    Diabetes:  Using medications without difficulties:yes Hypoglycemic episodes:no Hyperglycemic episodes:no Feet problems:no Blood Sugars averaging: usually ~ 135-145 eye exam within last year: yes More flatulence noted but o/w metformin tolerated.    Elevated Cholesterol: Using medications without problems:yes Muscle aches: not clearly from the statin, had some initially on statin but not now.  Diet compliance:yes Exercise:yes Labs d/w pt.  Lipids pending.    Mood is good.  Still working, doing well. No ADE on med.    PMH and SH reviewed  Meds, vitals, and allergies reviewed.   ROS: Per HPI unless specifically indicated in ROS section   GEN: nad, alert and oriented HEENT: mucous membranes moist NECK: supple w/o LA CV: rrr. PULM: ctab, no inc wob ABD: soft, +bs EXT: no edema CN 2-12 wnl B, S/S/DTR wnl x4 He does have some R leg pain with leaning forward but R SLR neg.   Pain in R arm reproduced with lifting above his head but he doesn't have typical pain on testing his cuff, no arm drop.   Back not ttp  Diabetic foot exam: Normal inspection No skin breakdown No calluses  Normal DP pulses Normal sensation to light touch and monofilament Nails normal

## 2015-12-22 NOTE — Assessment & Plan Note (Signed)
Mood is good. Still working, doing well. No ADE on med.

## 2015-12-22 NOTE — Assessment & Plan Note (Signed)
No weakness. See notes on imaging.  Okay for outpatient f/u.

## 2015-12-22 NOTE — Assessment & Plan Note (Signed)
Lipids pending.  See notes on labs.

## 2015-12-22 NOTE — Patient Instructions (Signed)
Go to the lab on the way out.  We'll contact you with your lab and xray report. Take care. Glad to see you.

## 2015-12-22 NOTE — Assessment & Plan Note (Signed)
A1c reasonable, continue work on diet and exercise.  No change in meds.  He agrees.

## 2015-12-22 NOTE — Assessment & Plan Note (Signed)
Likely radicular and not in the shoulder itself.  See notes on imaging.

## 2015-12-23 ENCOUNTER — Encounter: Payer: Self-pay | Admitting: Family Medicine

## 2015-12-23 LAB — LIPID PANEL
CHOLESTEROL: 164 mg/dL (ref 0–200)
HDL: 56.6 mg/dL (ref >39.00)
LDL Cholesterol: 85 mg/dL (ref 0–99)
NonHDL: 106.91
TRIGLYCERIDES: 111 mg/dL (ref 0.0–149.0)
Total CHOL/HDL Ratio: 3
VLDL: 22.2 mg/dL (ref 0.0–40.0)

## 2015-12-29 ENCOUNTER — Other Ambulatory Visit: Payer: Self-pay | Admitting: Family Medicine

## 2015-12-29 DIAGNOSIS — M792 Neuralgia and neuritis, unspecified: Secondary | ICD-10-CM

## 2015-12-30 ENCOUNTER — Encounter: Payer: Self-pay | Admitting: Family Medicine

## 2016-01-05 ENCOUNTER — Ambulatory Visit
Admission: RE | Admit: 2016-01-05 | Discharge: 2016-01-05 | Disposition: A | Discharge status: Home / Self Care | Payer: Medicare Other | Source: Ambulatory Visit | Attending: Family Medicine | Admitting: Family Medicine

## 2016-01-05 ENCOUNTER — Other Ambulatory Visit: Payer: Self-pay | Admitting: Family Medicine

## 2016-01-05 DIAGNOSIS — M4802 Spinal stenosis, cervical region: Secondary | ICD-10-CM | POA: Diagnosis not present

## 2016-01-05 DIAGNOSIS — Z01818 Encounter for other preprocedural examination: Secondary | ICD-10-CM | POA: Diagnosis not present

## 2016-01-05 DIAGNOSIS — Z77018 Contact with and (suspected) exposure to other hazardous metals: Secondary | ICD-10-CM

## 2016-01-05 DIAGNOSIS — M792 Neuralgia and neuritis, unspecified: Secondary | ICD-10-CM

## 2016-01-06 ENCOUNTER — Telehealth: Payer: Self-pay

## 2016-01-06 DIAGNOSIS — M4802 Spinal stenosis, cervical region: Secondary | ICD-10-CM | POA: Insufficient documentation

## 2016-01-06 DIAGNOSIS — E041 Nontoxic single thyroid nodule: Secondary | ICD-10-CM | POA: Insufficient documentation

## 2016-01-06 NOTE — Telephone Encounter (Signed)
See result note.  

## 2016-01-06 NOTE — Telephone Encounter (Signed)
Opal Sidles with [facility] called report for MRI of C spine. Report in epic and Dr Damita Dunnings notified.

## 2016-01-14 ENCOUNTER — Ambulatory Visit
Admission: RE | Admit: 2016-01-14 | Discharge: 2016-01-14 | Disposition: A | Discharge status: Home / Self Care | Payer: Medicare Other | Source: Ambulatory Visit | Attending: Family Medicine | Admitting: Family Medicine

## 2016-01-14 DIAGNOSIS — E041 Nontoxic single thyroid nodule: Secondary | ICD-10-CM

## 2016-01-15 ENCOUNTER — Other Ambulatory Visit: Payer: Self-pay | Admitting: Family Medicine

## 2016-01-15 DIAGNOSIS — E041 Nontoxic single thyroid nodule: Secondary | ICD-10-CM

## 2016-01-21 ENCOUNTER — Ambulatory Visit
Admission: RE | Admit: 2016-01-21 | Discharge: 2016-01-21 | Disposition: A | Discharge status: Home / Self Care | Payer: Medicare Other | Source: Ambulatory Visit | Attending: Family Medicine | Admitting: Family Medicine

## 2016-01-21 ENCOUNTER — Other Ambulatory Visit (HOSPITAL_COMMUNITY)
Admission: RE | Admit: 2016-01-21 | Discharge: 2016-01-21 | Disposition: A | Discharge status: Home / Self Care | Payer: Medicare Other | Source: Ambulatory Visit | Attending: Radiology | Admitting: Radiology

## 2016-01-21 DIAGNOSIS — E041 Nontoxic single thyroid nodule: Secondary | ICD-10-CM | POA: Diagnosis not present

## 2016-01-22 DIAGNOSIS — R030 Elevated blood-pressure reading, without diagnosis of hypertension: Secondary | ICD-10-CM | POA: Diagnosis not present

## 2016-01-22 DIAGNOSIS — M542 Cervicalgia: Secondary | ICD-10-CM | POA: Diagnosis not present

## 2016-01-22 DIAGNOSIS — M4802 Spinal stenosis, cervical region: Secondary | ICD-10-CM | POA: Diagnosis not present

## 2016-01-30 ENCOUNTER — Encounter: Payer: Self-pay | Admitting: Family Medicine

## 2016-02-20 ENCOUNTER — Encounter: Payer: Self-pay | Admitting: Family Medicine

## 2016-02-23 ENCOUNTER — Encounter: Payer: Self-pay | Admitting: *Deleted

## 2016-03-09 DIAGNOSIS — L570 Actinic keratosis: Secondary | ICD-10-CM | POA: Diagnosis not present

## 2016-03-09 DIAGNOSIS — D225 Melanocytic nevi of trunk: Secondary | ICD-10-CM | POA: Diagnosis not present

## 2016-03-09 DIAGNOSIS — D492 Neoplasm of unspecified behavior of bone, soft tissue, and skin: Secondary | ICD-10-CM | POA: Diagnosis not present

## 2016-03-09 DIAGNOSIS — L814 Other melanin hyperpigmentation: Secondary | ICD-10-CM | POA: Diagnosis not present

## 2016-03-11 DIAGNOSIS — M4802 Spinal stenosis, cervical region: Secondary | ICD-10-CM | POA: Diagnosis not present

## 2016-03-16 HISTORY — PX: CERVICAL FUSION: SHX112

## 2016-03-18 DIAGNOSIS — M4802 Spinal stenosis, cervical region: Secondary | ICD-10-CM | POA: Diagnosis not present

## 2016-03-18 DIAGNOSIS — R030 Elevated blood-pressure reading, without diagnosis of hypertension: Secondary | ICD-10-CM | POA: Diagnosis not present

## 2016-03-19 DIAGNOSIS — M4802 Spinal stenosis, cervical region: Secondary | ICD-10-CM | POA: Diagnosis not present

## 2016-03-19 DIAGNOSIS — M5001 Cervical disc disorder with myelopathy,  high cervical region: Secondary | ICD-10-CM | POA: Diagnosis not present

## 2016-04-01 DIAGNOSIS — M4802 Spinal stenosis, cervical region: Secondary | ICD-10-CM | POA: Diagnosis not present

## 2016-04-01 DIAGNOSIS — R030 Elevated blood-pressure reading, without diagnosis of hypertension: Secondary | ICD-10-CM | POA: Diagnosis not present

## 2016-04-08 ENCOUNTER — Encounter: Payer: Self-pay | Admitting: Family Medicine

## 2016-04-09 ENCOUNTER — Other Ambulatory Visit: Payer: Self-pay | Admitting: *Deleted

## 2016-04-09 MED ORDER — GLUCOSE BLOOD VI STRP: ORAL_STRIP | 11 refills | Status: DC

## 2016-04-11 NOTE — Telephone Encounter (Signed)
Prescription previously sent

## 2016-04-12 DIAGNOSIS — Z23 Encounter for immunization: Secondary | ICD-10-CM | POA: Diagnosis not present

## 2016-04-13 ENCOUNTER — Encounter: Payer: Self-pay | Admitting: Family Medicine

## 2016-04-28 ENCOUNTER — Encounter: Payer: Self-pay | Admitting: Family Medicine

## 2016-04-29 DIAGNOSIS — R030 Elevated blood-pressure reading, without diagnosis of hypertension: Secondary | ICD-10-CM | POA: Diagnosis not present

## 2016-04-29 DIAGNOSIS — M4802 Spinal stenosis, cervical region: Secondary | ICD-10-CM | POA: Diagnosis not present

## 2016-04-30 ENCOUNTER — Encounter: Payer: Self-pay | Admitting: Family Medicine

## 2016-04-30 ENCOUNTER — Telehealth: Payer: Self-pay | Admitting: Family Medicine

## 2016-04-30 NOTE — Telephone Encounter (Signed)
Mychart message sent.

## 2016-05-04 ENCOUNTER — Other Ambulatory Visit: Payer: Self-pay | Admitting: Family Medicine

## 2016-05-04 DIAGNOSIS — E119 Type 2 diabetes mellitus without complications: Secondary | ICD-10-CM

## 2016-05-10 ENCOUNTER — Other Ambulatory Visit (INDEPENDENT_AMBULATORY_CARE_PROVIDER_SITE_OTHER): Payer: Medicare Other

## 2016-05-10 DIAGNOSIS — E119 Type 2 diabetes mellitus without complications: Secondary | ICD-10-CM

## 2016-05-10 LAB — HEMOGLOBIN A1C: HEMOGLOBIN A1C: 6.9 % — AB (ref 4.6–6.5)

## 2016-05-25 ENCOUNTER — Encounter: Payer: Self-pay | Admitting: Family Medicine

## 2016-05-31 ENCOUNTER — Encounter: Payer: Self-pay | Admitting: Family Medicine

## 2016-05-31 ENCOUNTER — Ambulatory Visit (INDEPENDENT_AMBULATORY_CARE_PROVIDER_SITE_OTHER)
Admission: RE | Admit: 2016-05-31 | Discharge: 2016-05-31 | Disposition: A | Discharge status: Home / Self Care | Payer: Medicare Other | Source: Ambulatory Visit | Attending: Family Medicine | Admitting: Family Medicine

## 2016-05-31 ENCOUNTER — Ambulatory Visit (INDEPENDENT_AMBULATORY_CARE_PROVIDER_SITE_OTHER): Payer: Medicare Other | Admitting: Family Medicine

## 2016-05-31 VITALS — BP 120/58 | HR 63 | Temp 97.6°F | Wt 148.5 lb

## 2016-05-31 DIAGNOSIS — M545 Low back pain: Secondary | ICD-10-CM

## 2016-05-31 DIAGNOSIS — R634 Abnormal weight loss: Secondary | ICD-10-CM | POA: Diagnosis not present

## 2016-05-31 LAB — COMPREHENSIVE METABOLIC PANEL
ALT: 38 U/L (ref 0–53)
AST: 28 U/L (ref 0–37)
Albumin: 3.9 g/dL (ref 3.5–5.2)
Alkaline Phosphatase: 61 U/L (ref 39–117)
BUN: 17 mg/dL (ref 6–23)
CHLORIDE: 104 meq/L (ref 96–112)
CO2: 32 meq/L (ref 19–32)
Calcium: 8.9 mg/dL (ref 8.4–10.5)
Creatinine, Ser: 0.9 mg/dL (ref 0.40–1.50)
GFR: 87.77 mL/min (ref >60.00)
GLUCOSE: 108 mg/dL — AB (ref 70–99)
POTASSIUM: 4.4 meq/L (ref 3.5–5.1)
SODIUM: 141 meq/L (ref 135–145)
Total Bilirubin: 0.4 mg/dL (ref 0.2–1.2)
Total Protein: 6.1 g/dL (ref 6.0–8.3)

## 2016-05-31 LAB — CBC WITH DIFFERENTIAL/PLATELET
Basophils Absolute: 0 10*3/uL (ref 0.0–0.1)
Basophils Relative: 0.4 % (ref 0.0–3.0)
EOS PCT: 2.2 % (ref 0.0–5.0)
Eosinophils Absolute: 0.2 10*3/uL (ref 0.0–0.7)
HCT: 42.2 % (ref 39.0–52.0)
Hemoglobin: 14.5 g/dL (ref 13.0–17.0)
LYMPHS ABS: 1.7 10*3/uL (ref 0.7–4.0)
Lymphocytes Relative: 24.6 % (ref 12.0–46.0)
MCHC: 34.5 g/dL (ref 30.0–36.0)
MCV: 90.5 fl (ref 78.0–100.0)
MONO ABS: 0.7 10*3/uL (ref 0.1–1.0)
MONOS PCT: 9.3 % (ref 3.0–12.0)
NEUTROS ABS: 4.4 10*3/uL (ref 1.4–7.7)
NEUTROS PCT: 63.5 % (ref 43.0–77.0)
PLATELETS: 245 10*3/uL (ref 150.0–400.0)
RBC: 4.66 Mil/uL (ref 4.22–5.81)
RDW: 13.5 % (ref 11.5–15.5)
WBC: 7 10*3/uL (ref 4.0–10.5)

## 2016-05-31 LAB — TSH: TSH: 1.47 u[IU]/mL (ref 0.35–4.50)

## 2016-05-31 NOTE — Progress Notes (Signed)
Pre visit review using our clinic review tool, if applicable. No additional management support is needed unless otherwise documented below in the visit note. 

## 2016-05-31 NOTE — Assessment & Plan Note (Signed)
It is not clear if his taste changes are causing decreased intake which led to the loss of weight. I don't know the loss of weight has somehow caused his taste changes and lack of appetite. Is unclear what is the triggering event. He agrees with this assessment. There is no ominous single finding on exam. He is still well-appearing and has no "B" symptoms. Unclear if some of his symptoms are related to metformin use. His sugars are lower as expected with the weight loss. At this point check routine labs and taper metformin. He will update me in about 1-2 weeks, sooner as needed. He agrees. Okay for outpatient follow-up.

## 2016-05-31 NOTE — Patient Instructions (Signed)
Go to the lab on the way out.  We'll contact you with your lab and xray report.  Massage and stretch your lower back.   Cut the metformin back and see if that helps with taste changes, weight loss and/or appetite.  I would decrease by a tab every few days.  Update me as needed, in the next week or two.   Take care.  Glad to see you.

## 2016-05-31 NOTE — Progress Notes (Signed)
His neck is better and he can raise his hands above his head w/o prev sx now.  This is clearly better.    He has some occ burning in the R leg, intermittently noted.  No weakness.  No L sided sx.  No B/B sx except for metformin causing constipation.  Some occ R lower lateral back pain noted in the early AM when getting out of bed, better at the day goes on.  No rash, no trauma.   Pain on R side has been going on for about 1 month.  He has been on a tractor working for about 2-3 hours a day for the last month.    Weight loss/dec in appetite.  His sugar is running lower in the meantime.  Gradual weight loss.  Unclear if weight loss is from only dec in intake related to taste changes.  Food tastes differently- at red wines taste abnormal for patient now.  No vomiting.  No nausea.  No abd pain.  No blood in stool.  No black stools.  No fevers, no chills.  He doesn't feel sick or unwell.  Sugar has been 110-120s recently, lower than prev.  Now on 1500mg  metformin a day, down from 2000mg  a day.    PSA recently wnl.  Colonoscopy wnl 2017.    PMH and SH reviewed  ROS: Per HPI unless specifically indicated in ROS section   Meds, vitals, and allergies reviewed.   GEN: nad, alert and oriented HEENT: mucous membranes moist NECK: supple w/o LA CV: rrr. PULM: ctab, no inc wob ABD: soft, +bs EXT: no edema SKIN: no acute rash Normal range of motion of both shoulders Back is not tender in the midline. Right lower back is slightly tender to palpation. Distally he is neurovascularly intact on gross examination without weakness.

## 2016-05-31 NOTE — Assessment & Plan Note (Signed)
I think this is incidental and not related to his weight loss. Recheck plain films today. He agrees. Discussed with patient about massage and stretching.

## 2016-06-09 ENCOUNTER — Encounter: Payer: Self-pay | Admitting: Family Medicine

## 2016-06-11 ENCOUNTER — Encounter: Payer: Self-pay | Admitting: Family Medicine

## 2016-06-28 ENCOUNTER — Ambulatory Visit (INDEPENDENT_AMBULATORY_CARE_PROVIDER_SITE_OTHER): Payer: Medicare Other | Admitting: Family Medicine

## 2016-06-28 ENCOUNTER — Encounter: Payer: Self-pay | Admitting: Family Medicine

## 2016-06-28 DIAGNOSIS — M79605 Pain in left leg: Secondary | ICD-10-CM

## 2016-06-28 NOTE — Patient Instructions (Signed)
You sustained a hip flexor strain. It's possible you also strained your adductors and flared up mild hip arthritis but these parts of your exam are completely normal. Do home exercises 3 sets of 10 once a day - straight leg raises, hip adduction, standing hip rotations - add ankle weight if these become too easy. Tylenol and/or aleve if needed for pain especially when you go out hunting. Try to hunt for 3-4 hours that first day when you go to [place] at first, see how you're feeling the next day before you go out for a longer time. Heat as needed for spasms 15 minutes at a time. Consider physical therapy as well. Follow up with me as needed.

## 2016-06-29 ENCOUNTER — Encounter: Payer: Self-pay | Admitting: Family Medicine

## 2016-06-30 ENCOUNTER — Other Ambulatory Visit: Payer: Self-pay | Admitting: Family Medicine

## 2016-06-30 DIAGNOSIS — M79605 Pain in left leg: Secondary | ICD-10-CM | POA: Insufficient documentation

## 2016-06-30 MED ORDER — GLUCOSE BLOOD VI STRP: ORAL_STRIP | 99 refills | Status: DC

## 2016-06-30 NOTE — Assessment & Plan Note (Signed)
mostly improved at this point.  Consistent with hip flexor strain.  We discussed possibilities of adductor strain and hip arthritis also.  If arthritis present would be mild based on his good ROM here.  Shown home exercises to do daily.  Tylenol, aleve if needed.  Heat for spasms.  Consider physical therapy.  F/u prn.

## 2016-06-30 NOTE — Progress Notes (Signed)
PCP: Elsie Stain, MD  Subjective:   HPI: Patient is a 75 y.o. male here for left leg pain.  Patient reports is drove to [place] to hunt pheasant. 3 days ago when he was out there he fell several times due to the terrain. No acute pain with any of these falls. States woke up the next day and had severe pain just trying to walk out to his car. Pain was in anterior groin left leg. No bruising or swelling. Took ibuprofen 600mg . Pain was 8/10 and sharp on Friday but now pain is 0/10. No skin changes, numbness.  Past Medical History:  Diagnosis Date  . Abnormal GGT test    With otherwise normal work up.  . Arthritis   . Diabetes mellitus without complication ([place])   . Foot pain    Bilateral  . Hyperglycemia   . Hyperlipidemia   . Nephrolithiasis   . 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 Persistent disorder of initiating or maintaining sleep   . Pes planus   . Rosacea   . Snoring     Current Outpatient Prescriptions on File Prior to Visit  Medication Sig Dispense Refill  . ACCU-CHEK FASTCLIX LANCETS MISC Use as instructed to check blood sugar once daily.  Diagnosis:  E11.9  Non-Insulin dependent.    Marland Kitchen aspirin 81 MG EC tablet Take 81 mg by mouth daily.     Marland Kitchen EPINEPHrine 0.3 mg/0.3 mL IJ SOAJ injection Inject 0.3 mLs (0.3 mg total) into the muscle once. 2 Device 1  . metFORMIN (GLUCOPHAGE) 500 MG tablet Take 1 tablet (500 mg total) each morning and 2 tablets (1000 mg total) each evening.    . Multiple Vitamin (MULTIVITAMIN) tablet Take 1 tablet by mouth daily.     . nitroGLYCERIN (NITROSTAT) 0.4 MG SL tablet Place 1 tablet (0.4 mg total) under the tongue every 5 (five) minutes as needed for chest pain (max 3 doses/15 min.call MD/to ER if used). 25 tablet 3  . omeprazole (PRILOSEC) 20 MG capsule Take 20 mg by mouth daily as needed. As needed    . psyllium (REGULOID) 0.52 G capsule Take 0.52 g by mouth 2 (two) times daily.    . sertraline (ZOLOFT) 50 MG tablet Take 1  tablet by mouth  daily 90 tablet 3  . simvastatin (ZOCOR) 10 MG tablet Take 1 tablet by mouth at  bedtime 90 tablet 3   No current facility-administered medications on file prior to visit.     Past Surgical History:  Procedure Laterality Date  . APPENDECTOMY  1949   at age 68  . CERVICAL FUSION    . Cyst removed from breast  1960  . FOOT SURGERY  01/2009   Left  . HERNIA REPAIR  1956   at age 79 (single)  . HERNIA REPAIR  1960   (double)  . TONSILLECTOMY AND ADENOIDECTOMY  ~ 1950  . VASECTOMY  ~ 1984    Allergies  Allergen Reactions  . Bee Venom   . Erythromycin     Oral ulcers (but tolerates zithromax)  . Pneumococcal Vaccine Polyvalent     REACTION: rash    Social History   Social History  . Marital status: Married    Spouse name: N/A  . Number of children: 4  . Years of education: N/A   Occupational History  . 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 Works part time  at a desk job.    Social History Main Topics  . Smoking status: Never Smoker  . Smokeless tobacco: Former Systems developer  . Alcohol use 3.6 oz/week    6 Standard drinks or equivalent per week     Comment: 1-2 glasses of wine at night  . Drug use: No  . Sexual activity: No   Other Topics Concern  . 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   4 kids, 2 are MD's   [employer] and stays active, enjoys skiing, very active, walks at least 1/2 mile daily    Family History  Problem Relation Age of Onset  . Heart disease Father 26    First MI  . Hyperlipidemia Father   . Heart attack Father   . Diabetes Sister     DM2  . Hyperlipidemia Sister   . Arthritis Other   . Diabetes Other   . Prostate cancer Neg Hx   . Colon cancer Neg Hx   . Diabetes Brother   . Hyperlipidemia Brother   . Hypertension Brother   . Heart disease Brother     CAD s/p stent  . CAD  Brother   . Heart disease Brother   . Heart disease Brother     BP 134/75   Pulse (!) 53   Ht 5\' 7"  (1.702 m)   Wt 145 lb (65.8 kg)   BMI 22.71 kg/m   Review of Systems: See HPI above.     Objective:  Physical Exam:  Gen: NAD, comfortable in exam room  Back/left leg: No gross deformity, scoliosis. No TTP.  No midline or bony TTP. FROM.   Strength LEs 5/5 all muscle groups except 5-/5 strength with hip flexion.   2+ MSRs in patellar and achilles tendons, equal bilaterally. Negative SLRs. Sensation intact to light touch bilaterally. Negative logroll bilateral hips Negative fabers and piriformis stretches.   Assessment & Plan:  1. Left leg pain - mostly improved at this point.  Consistent with hip flexor strain.  We discussed possibilities of adductor strain and hip arthritis also.  If arthritis present would be mild based on his good ROM here.  Shown home exercises to do daily.  Tylenol, aleve if needed.  Heat for spasms.  Consider physical therapy.  F/u prn.

## 2016-07-21 ENCOUNTER — Ambulatory Visit (INDEPENDENT_AMBULATORY_CARE_PROVIDER_SITE_OTHER): Payer: Medicare Other | Admitting: Family Medicine

## 2016-07-21 ENCOUNTER — Encounter: Payer: Self-pay | Admitting: Family Medicine

## 2016-07-21 DIAGNOSIS — H109 Unspecified conjunctivitis: Secondary | ICD-10-CM | POA: Diagnosis not present

## 2016-07-21 DIAGNOSIS — E119 Type 2 diabetes mellitus without complications: Secondary | ICD-10-CM | POA: Diagnosis not present

## 2016-07-21 DIAGNOSIS — M79671 Pain in right foot: Secondary | ICD-10-CM | POA: Diagnosis not present

## 2016-07-21 NOTE — Progress Notes (Signed)
He was not included in a research study since his TG were too low and he was too healthy.  Med list correct today.    He isn't having leg cramping.  He has seen Dr. Joya Salm in the meantime and had a good follow up appointment and outcome.  Still with some burning on the R leg since surgery.    Now with R 1st toe occasionally ttp at/near IP joint.  He has nail fungus at baseline.  Present intermittently for a few months.  He can still feel the floor well.  Balance is still good.  He wanted to make sure he didn't have neuropathy.  Uses toe spacer on L 1st web space but not on the R foot.  No trauma.   R eye sx.  White of the eye was reddish. Using neomycin, bacitracin drop in the meantime.   No vision changes.  Prev with discharge.  No L sided sx.  Clearly better since starting the drops.  No known FB sensation/injury.    Sugar has gone up slightly in the meantime, up to 174 today fasting. He felt better on lower dose of metformin.  Most recent A1c controlled, d/w pt.    Meds, vitals, and allergies reviewed.   ROS: Per HPI unless specifically indicated in ROS section   nad ncat R eye with minimal conjunctival injection w/o discharge noted.  L eye exam wnl rrr ctab  Diabetic foot exam: Normal inspection No skin breakdown Calluses on B feet noted.   Normal DP pulses Normal sensation to light touch and monofilament Nails slightly thickened L 2nd toe overlap noted, using spacer at baseline.

## 2016-07-21 NOTE — Patient Instructions (Addendum)
Recheck A1c after 08/09/16.  We'll go from there.   Keep using the eyedrop in the meantime.  You may have irritation on the right 1st toe joint.  You may need extra padding in your shoes or a thicker sock.  If it isn't a lot worse, then I wouldn't do anything else.   Take care.  Glad to see you.

## 2016-07-21 NOTE — Progress Notes (Signed)
Pre visit review using our clinic review tool, if applicable. No additional management support is needed unless otherwise documented below in the visit note. 

## 2016-07-22 DIAGNOSIS — M79673 Pain in unspecified foot: Secondary | ICD-10-CM | POA: Insufficient documentation

## 2016-07-22 DIAGNOSIS — H109 Unspecified conjunctivitis: Secondary | ICD-10-CM | POA: Insufficient documentation

## 2016-07-22 NOTE — Assessment & Plan Note (Signed)
Continue as is. Recheck A1c in about 1 month.

## 2016-07-22 NOTE — Assessment & Plan Note (Signed)
Improved with drops. Continue until resolved.

## 2016-07-22 NOTE — Assessment & Plan Note (Signed)
No sign of neuropathy. 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 Imaging would likely not change plan sense he has mild symptoms. Discussed with patient about making sure he has enough cushioning in his shoes. Update me as needed. He agrees

## 2016-07-27 ENCOUNTER — Encounter: Payer: Self-pay | Admitting: Family Medicine

## 2016-07-27 LAB — CREATININE
ALT: 21
AST: 16 U/L
CHOLESTEROL, TOTAL: 187
CK TOTAL: 90 U/L (ref ?–195.0)
CREATININE: 0.82
GLUCOSE: 158
HDL Cholesterol: 56
HEMOGLOBIN A1C: 6.6
Hemoglobin: 15.4
LDL CHOLESTEROL (CALC): 102
TRIGLYCERIDES: 144
Uric Acid: 4.4

## 2016-08-03 DIAGNOSIS — E119 Type 2 diabetes mellitus without complications: Secondary | ICD-10-CM | POA: Diagnosis not present

## 2016-08-03 DIAGNOSIS — H5213 Myopia, bilateral: Secondary | ICD-10-CM | POA: Diagnosis not present

## 2016-08-03 LAB — HM DIABETES EYE EXAM

## 2016-08-08 ENCOUNTER — Encounter: Payer: Self-pay | Admitting: Family Medicine

## 2016-08-10 ENCOUNTER — Encounter: Payer: Self-pay | Admitting: Family Medicine

## 2016-08-12 DIAGNOSIS — M4802 Spinal stenosis, cervical region: Secondary | ICD-10-CM | POA: Diagnosis not present

## 2016-09-13 ENCOUNTER — Other Ambulatory Visit: Payer: Self-pay | Admitting: Family Medicine

## 2016-09-13 ENCOUNTER — Encounter: Payer: Self-pay | Admitting: Family Medicine

## 2016-09-13 DIAGNOSIS — E119 Type 2 diabetes mellitus without complications: Secondary | ICD-10-CM

## 2016-09-13 MED ORDER — METFORMIN HCL 500 MG PO TABS: ORAL_TABLET | ORAL | Status: DC

## 2016-09-14 ENCOUNTER — Other Ambulatory Visit (INDEPENDENT_AMBULATORY_CARE_PROVIDER_SITE_OTHER): Payer: Medicare Other

## 2016-09-14 DIAGNOSIS — E119 Type 2 diabetes mellitus without complications: Secondary | ICD-10-CM | POA: Diagnosis not present

## 2016-09-14 LAB — HEMOGLOBIN A1C: Hgb A1c MFr Bld: 8.1 % — ABNORMAL HIGH (ref 4.6–6.5)

## 2016-09-17 ENCOUNTER — Encounter: Payer: Self-pay | Admitting: *Deleted

## 2016-09-22 ENCOUNTER — Encounter: Payer: Self-pay | Admitting: Family Medicine

## 2016-10-05 ENCOUNTER — Encounter: Payer: Self-pay | Admitting: Family Medicine

## 2016-10-06 ENCOUNTER — Encounter: Payer: Self-pay | Admitting: Family Medicine

## 2016-10-19 DIAGNOSIS — M205X2 Other deformities of toe(s) (acquired), left foot: Secondary | ICD-10-CM | POA: Diagnosis not present

## 2016-10-26 ENCOUNTER — Other Ambulatory Visit: Payer: Self-pay | Admitting: Family Medicine

## 2016-10-27 NOTE — Telephone Encounter (Signed)
Sent.  Thanks. Updated.

## 2016-10-27 NOTE — Telephone Encounter (Signed)
Received refill request electronically Refill request from pharmacy does not match medication list Please confirm correct directions

## 2016-11-04 ENCOUNTER — Ambulatory Visit (INDEPENDENT_AMBULATORY_CARE_PROVIDER_SITE_OTHER): Payer: Medicare Other | Admitting: Family Medicine

## 2016-11-04 ENCOUNTER — Encounter: Payer: Self-pay | Admitting: Family Medicine

## 2016-11-04 VITALS — BP 122/78 | HR 63 | Temp 98.4°F | Wt 159.8 lb

## 2016-11-04 DIAGNOSIS — R1310 Dysphagia, unspecified: Secondary | ICD-10-CM

## 2016-11-04 DIAGNOSIS — S060X1D Concussion with loss of consciousness of 30 minutes or less, subsequent encounter: Secondary | ICD-10-CM

## 2016-11-04 DIAGNOSIS — E119 Type 2 diabetes mellitus without complications: Secondary | ICD-10-CM

## 2016-11-04 DIAGNOSIS — M79673 Pain in unspecified foot: Secondary | ICD-10-CM | POA: Diagnosis not present

## 2016-11-04 MED ORDER — ACCU-CHEK FASTCLIX LANCETS MISC: 3 refills | Status: DC

## 2016-11-04 MED ORDER — METFORMIN HCL 500 MG PO TABS: 1000.0000 mg | ORAL_TABLET | Freq: Two times a day (BID) | ORAL | Status: DC

## 2016-11-04 NOTE — Progress Notes (Signed)
Pre visit review using our clinic review tool, if applicable. No additional management support is needed unless otherwise documented below in the visit note. 

## 2016-11-04 NOTE — Progress Notes (Signed)
Prev LOC for a brief period of time, <51min, with presumed concussion.  He was skiing and the next thing he knew he was looking up at 4 others.  Had medical eval at that point.  No residual sx afterward.  He had helmet and goggles on.  Was likely hit on the L side of the nose.  He had no symptoms of presyncope. It was thought that the patient fell and then had loss of consciousness, not that he had syncope to cause him to fall.  Diabetes:  Using medications without difficulties: yes Hypoglycemic episodes:no Hyperglycemic episodes: no Feet problems: no Blood Sugars averaging: ~170s recently, on 2000mg  metformin a day.   eye exam within last year:yes Last A1c 8.1.   He is active but not getting a lot of exercise otherwise.   He has been eating a few more chips/carbs recently.  D/w pt about diet and exercise in the meantime.    He continues to have L foot pain.  He saw ortho in the meantime.  He is considering surgical options, up to and including 2nd toe amputation.    Throat sx with eating, only with mealtime, only with solids.  He'll have progressive phlegm in his throat.  He'll have to cough it up.  Occ will have to vomit from a bad flare.  Minimal GERD/heartburn sx.  Sometimes food will not make esophageal transit quickly and he'll have some pain with passage.  Has been off prilosec recently.  No blood in vomiting.  No black stools.  Head of bed is elevated.  D/w pt about GI referral.    We talked about colon cancer screening.  He is referred to GI but we may be able to do cologuard if he doesn't have colonoscopy done.    Meds, vitals, and allergies reviewed.   ROS: Per HPI unless specifically indicated in ROS section   GEN: nad, alert and oriented HEENT: mucous membranes moist NECK: supple w/o LA CV: rrr. PULM: ctab, no inc wob EXT: no edema

## 2016-11-04 NOTE — Patient Instructions (Addendum)
Recheck A1c in 11/2016 before a visit.  Update me if sugar isn't better with diet and exercise.   Restart omeprazole. Rosaria Ferries will call about your referral. Take care.  Glad to see you.

## 2016-11-05 ENCOUNTER — Encounter: Payer: Self-pay | Admitting: Gastroenterology

## 2016-11-05 ENCOUNTER — Encounter: Payer: Self-pay | Admitting: Family Medicine

## 2016-11-05 DIAGNOSIS — S060XAA Concussion with loss of consciousness status unknown, initial encounter: Secondary | ICD-10-CM | POA: Insufficient documentation

## 2016-11-05 DIAGNOSIS — R1310 Dysphagia, unspecified: Secondary | ICD-10-CM | POA: Insufficient documentation

## 2016-11-05 DIAGNOSIS — S060X9A Concussion with loss of consciousness of unspecified duration, initial encounter: Secondary | ICD-10-CM | POA: Insufficient documentation

## 2016-11-05 NOTE — Assessment & Plan Note (Signed)
He will work harder on diet and exercise. Recheck A1c later on. If sugars not improved on home checks he will update me and we can add a new medication if needed. He agrees.

## 2016-11-05 NOTE — Assessment & Plan Note (Signed)
Presumed concussion. Presumed that he fell, hit his head, had concussion with loss of consciousness. This was while skiing. No neurologic symptoms in the meantime. If he has any recurrent headaches or dizziness or other neurologic symptoms he will let me know. Discussed with patient about routine cautions at this point.

## 2016-11-05 NOTE — Assessment & Plan Note (Signed)
We will need to get his A1c down before reasonable to have surgery. Discussed patient.

## 2016-11-05 NOTE — Assessment & Plan Note (Signed)
Restart PPI. Refer to GI. May end up needing EGD. If colonoscopy done we can consider cologuard here.  He agrees.  >25 minutes spent in face to face time with patient, >50% spent in counselling or coordination of care.

## 2016-11-08 ENCOUNTER — Encounter: Payer: Self-pay | Admitting: Family Medicine

## 2016-11-09 MED ORDER — ACCU-CHEK FASTCLIX LANCETS MISC: 0 refills | Status: DC

## 2016-11-19 DIAGNOSIS — H1033 Unspecified acute conjunctivitis, bilateral: Secondary | ICD-10-CM | POA: Diagnosis not present

## 2016-11-23 DIAGNOSIS — H1033 Unspecified acute conjunctivitis, bilateral: Secondary | ICD-10-CM | POA: Diagnosis not present

## 2016-11-26 ENCOUNTER — Encounter: Payer: Self-pay | Admitting: Family Medicine

## 2016-12-09 ENCOUNTER — Encounter: Payer: Self-pay | Admitting: Gastroenterology

## 2016-12-09 ENCOUNTER — Ambulatory Visit (INDEPENDENT_AMBULATORY_CARE_PROVIDER_SITE_OTHER): Payer: Medicare Other | Admitting: Gastroenterology

## 2016-12-09 VITALS — BP 118/70 | HR 72 | Ht 67.0 in | Wt 155.0 lb

## 2016-12-09 DIAGNOSIS — R1310 Dysphagia, unspecified: Secondary | ICD-10-CM | POA: Diagnosis not present

## 2016-12-09 DIAGNOSIS — K219 Gastro-esophageal reflux disease without esophagitis: Secondary | ICD-10-CM | POA: Diagnosis not present

## 2016-12-09 DIAGNOSIS — Z1211 Encounter for screening for malignant neoplasm of colon: Secondary | ICD-10-CM | POA: Diagnosis not present

## 2016-12-09 MED ORDER — NA SULFATE-K SULFATE-MG SULF 17.5-3.13-1.6 GM/177ML PO SOLN: 1.0000 | Freq: Once | ORAL | 0 refills | Status: AC

## 2016-12-09 NOTE — Patient Instructions (Signed)
If you are age 74 or older, your body mass index should be between 23-30. Your Body mass index is 24.28 kg/m. If this is out of the aforementioned range listed, please consider follow up with your Primary Care Provider.  If you are age 86 or younger, your body mass index should be between 19-25. Your Body mass index is 24.28 kg/m. If this is out of the aformentioned range listed, please consider follow up with your Primary Care Provider.   You have been scheduled for an endoscopy and colonoscopy. Please follow the written instructions given to you at your visit today. Please pick up your prep supplies at the pharmacy within the next 1-3 days. If you use inhalers (even only as needed), please bring them with you on the day of your procedure. Your physician has requested that you go to www.startemmi.com and enter the access code given to you at your visit today. This web site gives a general overview about your procedure. However, you should still follow specific instructions given to you by our office regarding your preparation for the procedure.  Thank you for choosing [facility]  Dr Wilfrid Lund III

## 2016-12-09 NOTE — Progress Notes (Signed)
Greg Rogers:  History: Greg Rogers. 12/09/2016  Referring physician: Elsie Stain, MD  Reason for consult/chief complaint: Dysphagia (Usually occurs with dinner. ? related to reflux. Vomits at times just to get contents out)   Subjective  HPI:  This is a 76 year old man referred by primary care for dysphagia. He was last seen in this clinic for screening colonoscopy by Dr. Deatra Rogers in September 2007 or 8 it was a normal study, and he has not had a follow-up exam since then. He has had intermittent GERD symptoms for the last couple of years. He says it improved with a trial of PPI and lifestyle, and he eventually got off the medicine. For the last several months he has had episodic dysphagia. He will have some mucus buildup in his throat with meals, and then episodes of chest tightness with the food feels stuck in the midesophagus. On occasion he has had to bring it back up. He recently had another 14 day trial of PPI without any discernible improvement in dysphagia. He lost 10 or 12 pounds last fall for unclear reasons. He said that he just lost his taste for food. His doctor briefly took him off metformin and slowly the symptoms resolved. He has put a few of those pounds back on, and he is back on metformin without further trouble except for some constipation that he attributes to it. He has noticed no change in his vocal quality. He has dysphagia with solids but not liquids.  ROS:  Review of Systems  Constitutional: Negative for appetite change and unexpected weight change.  HENT: Negative for mouth sores and voice change.   Eyes: Negative for pain and redness.  Respiratory: Negative for cough and shortness of breath.   Cardiovascular: Negative for chest pain and palpitations.  Genitourinary: Negative for dysuria and hematuria.  Musculoskeletal: Negative for arthralgias and myalgias.  Skin: Negative for pallor and rash.  Neurological: Negative  for weakness and headaches.  Hematological: Negative for adenopathy.     Past Medical History: Past Medical History:  Diagnosis Date  . Abnormal GGT test    With otherwise normal work up.  . Arthritis   . Diabetes mellitus without complication ([place])   . Foot pain    Bilateral  . Hyperglycemia   . Hyperlipidemia   . Nephrolithiasis   . 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 Persistent disorder of initiating or maintaining sleep   . Pes planus   . Rosacea   . Snoring      Past Surgical History: Past Surgical History:  Procedure Laterality Date  . APPENDECTOMY  1949   at age 29  . CERVICAL FUSION  03/2016   C 3&4  . Cyst removed from breast  1960  . FOOT SURGERY  01/2009   Left  . HERNIA REPAIR  1956   at age 5 (single)  . HERNIA REPAIR  1960   (double)  . TONSILLECTOMY AND ADENOIDECTOMY  ~ 1950  . VASECTOMY  ~ 51     Family History: Family History  Problem Relation Age of Onset  . Heart disease Father 27    First MI  . Hyperlipidemia Father   . Heart attack Father   . Diabetes Brother   . Hyperlipidemia Brother   . Hypertension Brother   . Heart disease Brother     CAD s/p stent  . CAD Brother   . Heart disease Brother   . Heart disease Brother   .  Diabetes Sister     DM2  . Hyperlipidemia Sister   . Arthritis Other   . Diabetes Other   . Prostate cancer Neg Hx   . Colon cancer Neg Hx      Social History: Social History   Social History  . Marital status: Married    Spouse name: N/A  . Number of children: 4  . Years of education: N/A   Occupational History  . 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 Works part time at [employer] job.    Social History Main Topics  . Smoking status: Never Smoker  . Smokeless tobacco: Former Systems developer  . Alcohol use 3.6 oz/week    6 Standard drinks or equivalent per week     Comment: 1-2 glasses of wine at night  . Drug use: No  .  Sexual activity: No   Other Topics Concern  . 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   4 kids, 2 are MD's   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  It sounds as if he may have a difficult relationship with his wife. Allergies: Allergies  Allergen Reactions  . Bee Venom   . Erythromycin     Oral ulcers (but tolerates zithromax)  . Pneumococcal Vaccine Polyvalent     REACTION: rash    Outpatient Meds: Current Outpatient Prescriptions  Medication Sig Dispense Refill  . ACCU-CHEK FASTCLIX LANCETS MISC Use as instructed to check blood sugar once daily.  Diagnosis:  E11.9  Non-Insulin dependent. 100 each 0  . aspirin 81 MG EC tablet Take 81 mg by mouth daily.     Marland Kitchen EPINEPHrine 0.3 mg/0.3 mL IJ SOAJ injection Inject 0.3 mLs (0.3 mg total) into the muscle once. 2 Device 1  . glucose blood (ACCU-CHEK AVIVA PLUS) test strip Use to check blood sugar once daily.  Diagnosis:  E11.9   Non-insulin dependent. 100 each prn  . metFORMIN (GLUCOPHAGE) 500 MG tablet Take 2 tablets (1,000 mg total) by mouth 2 (two) times daily with a meal.    . Multiple Vitamin (MULTIVITAMIN) tablet Take 1 tablet by mouth daily.     . nitroGLYCERIN (NITROSTAT) 0.4 MG SL tablet Place 1 tablet (0.4 mg total) under the tongue every 5 (five) minutes as needed for chest pain (max 3 doses/15 min.call MD/to ER if used). 25 tablet 3  . omeprazole (PRILOSEC) 20 MG capsule Take 20 mg by mouth daily as needed. As needed    . psyllium (REGULOID) 0.52 G capsule Take 0.52 g by mouth 2 (two) times daily.    . sertraline (ZOLOFT) 50 MG tablet TAKE 1 TABLET BY MOUTH  DAILY 90 tablet 0  . simvastatin (ZOCOR) 10 MG tablet TAKE 1 TABLET BY MOUTH AT  BEDTIME 90 tablet 0  . Na Sulfate-K Sulfate-Mg Sulf 17.5-3.13-1.6 GM/180ML SOLN Take  1 kit by mouth once. 354 mL 0   No current facility-administered medications for this visit.       ___________________________________________________________________ Objective   Exam:  BP 118/70   Pulse 72   Ht _0  (1.702 m)   Wt 155 lb (70.3 kg)   BMI 24.28 kg/m    General: this is a(n)  Well-appearing man with good muscle mass, normal vocal quality   Eyes: sclera anicteric, no redness  ENT: oral mucosa moist without lesions, no cervical or supraclavicular lymphadenopathy, good dentition, no axillary adenopathy  CV: RRR without murmur, S1/S2, no JVD, no peripheral edema  Resp: clear to auscultation bilaterally, normal RR and effort noted  GI: soft, no tenderness, with active bowel sounds. No guarding or palpable organomegaly noted.  Skin; warm and dry, no rash or jaundice noted  Neuro: awake, alert and oriented x 3. Normal gross motor function and fluent speech  Labs:  CBC Latest Ref Rng & Units 05/31/2016 01/25/2009  WBC 4.0 - 10.5 K/uL 7.0 -  Hemoglobin 13.0 - 17.0 g/dL 14.5 17.7(H)  Hematocrit 39.0 - 52.0 % 42.2 52.0  Platelets 150.0 - 400.0 K/uL 245.0 -   CMP Latest Ref Rng & Units 06/30/2016 05/31/2016 12/17/2015  Glucose 70 - 99 mg/dL - 108(H) 135(H)  BUN 6 - 23 mg/dL - 17 17  Creatinine - 0.82 0.90 0.93  Sodium 135 - 145 mEq/L - 141 141  Potassium 3.5 - 5.1 mEq/L - 4.4 4.6  Chloride 96 - 112 mEq/L - 104 104  CO2 19 - 32 mEq/L - 32 31  Calcium 8.4 - 10.5 mg/dL - 8.9 9.5  Total Protein 6.0 - 8.3 g/dL - 6.1 6.2  Total Bilirubin 0.2 - 1.2 mg/dL - 0.4 0.5  Alkaline Phos 39 - 117 U/L - 61 69  AST U/L _0 ALT - 21 38 21   No radiologic studies for review  Assessment: Encounter Diagnoses  Name Primary?  Marland Kitchen Dysphagia, unspecified type Yes  . Gastroesophageal reflux disease, esophagitis presence not specified   . Special screening for malignant neoplasms, colon     His dysphagia sounds more likely to be mechanical in motility. No ongoing weight  loss to suggest a malignancy is likely. I suspect he may have a reflux-induced stricture requiring dilation. He is also due for screening colonoscopy  Plan:  EGD with possible dilation as well as screening colonoscopy.  The benefits and risks of the planned procedure were described in detail with the patient or (when appropriate) their health care proxy.  Risks were outlined as including, but not limited to, bleeding, infection, perforation, adverse medication reaction leading to cardiac or pulmonary decompensation, or pancreatitis (if ERCP).  The limitation of incomplete mucosal visualization was also discussed.  No guarantees or warranties were given.  I do not think he needs to resume the PPI until after the endoscopy. It does not sound like it was of much help lately.  Thank you for the courtesy of this consult.  Please call me with any questions or concerns.  Nelida Meuse III  CC: Greg Stain, MD

## 2016-12-17 ENCOUNTER — Telehealth: Payer: Self-pay | Admitting: Family Medicine

## 2016-12-17 ENCOUNTER — Other Ambulatory Visit (INDEPENDENT_AMBULATORY_CARE_PROVIDER_SITE_OTHER): Payer: Medicare Other

## 2016-12-17 DIAGNOSIS — E119 Type 2 diabetes mellitus without complications: Secondary | ICD-10-CM | POA: Diagnosis not present

## 2016-12-17 DIAGNOSIS — E785 Hyperlipidemia, unspecified: Secondary | ICD-10-CM

## 2016-12-17 LAB — HEMOGLOBIN A1C: Hgb A1c MFr Bld: 7.7 % — ABNORMAL HIGH (ref 4.6–6.5)

## 2016-12-17 NOTE — Addendum Note (Signed)
Addended by: Josetta Huddle on: 12/17/2016 10:07 AM   Modules accepted: Orders

## 2016-12-17 NOTE — Telephone Encounter (Signed)
Pt is at [facility] for 3 month a1c check, orders are not in epic, can you put orders in please? Greg Rogers is going to go ahead and draw labs, but still need orders.  Thank you

## 2016-12-20 ENCOUNTER — Encounter: Payer: Self-pay | Admitting: Family Medicine

## 2016-12-24 ENCOUNTER — Other Ambulatory Visit: Payer: Self-pay | Admitting: Family Medicine

## 2016-12-24 MED ORDER — GLIMEPIRIDE 2 MG PO TABS: 2.0000 mg | ORAL_TABLET | Freq: Every day | ORAL | 3 refills | Status: DC

## 2016-12-27 ENCOUNTER — Encounter: Payer: Self-pay | Admitting: Gastroenterology

## 2016-12-27 ENCOUNTER — Ambulatory Visit (AMBULATORY_SURGERY_CENTER): Payer: Medicare Other | Admitting: Gastroenterology

## 2016-12-27 VITALS — BP 125/60 | HR 64 | Temp 98.6°F | Resp 16 | Ht 67.0 in | Wt 155.0 lb

## 2016-12-27 DIAGNOSIS — D122 Benign neoplasm of ascending colon: Secondary | ICD-10-CM

## 2016-12-27 DIAGNOSIS — R1319 Other dysphagia: Secondary | ICD-10-CM

## 2016-12-27 DIAGNOSIS — E119 Type 2 diabetes mellitus without complications: Secondary | ICD-10-CM | POA: Diagnosis not present

## 2016-12-27 DIAGNOSIS — K222 Esophageal obstruction: Secondary | ICD-10-CM | POA: Diagnosis not present

## 2016-12-27 DIAGNOSIS — Z1211 Encounter for screening for malignant neoplasm of colon: Secondary | ICD-10-CM | POA: Diagnosis not present

## 2016-12-27 DIAGNOSIS — R1310 Dysphagia, unspecified: Secondary | ICD-10-CM

## 2016-12-27 DIAGNOSIS — Z1212 Encounter for screening for malignant neoplasm of rectum: Secondary | ICD-10-CM | POA: Diagnosis not present

## 2016-12-27 MED ORDER — SODIUM CHLORIDE 0.9 % IV SOLN: 500.0000 mL | INTRAVENOUS | Status: DC

## 2016-12-27 NOTE — Patient Instructions (Signed)
YOU HAD AN ENDOSCOPIC PROCEDURE TODAY AT [facility]:   Refer to the procedure report that was given to you for any specific questions about what was found during the examination.  If the procedure report does not answer your questions, please call your gastroenterologist to clarify.  If you requested that your care partner not be given the details of your procedure findings, then the procedure report has been included in a sealed envelope for you to review at your convenience later.  YOU SHOULD EXPECT: Some feelings of bloating in the abdomen. Passage of more gas than usual.  Walking can help get rid of the air that was put into your GI tract during the procedure and reduce the bloating. If you had a lower endoscopy (such as a colonoscopy or flexible sigmoidoscopy) you may notice spotting of blood in your stool or on the toilet paper. If you underwent a bowel prep for your procedure, you may not have a normal bowel movement for a few days.  Please Note:  You might notice some irritation and congestion in your nose or some drainage.  This is from the oxygen used during your procedure.  There is no need for concern and it should clear up in a day or so.  SYMPTOMS TO REPORT IMMEDIATELY:   Following lower endoscopy (colonoscopy or flexible sigmoidoscopy):  Excessive amounts of blood in the stool  Significant tenderness or worsening of abdominal pains  Swelling of the abdomen that is new, acute  Fever of 100F or higher   Following upper endoscopy (EGD)  Vomiting of blood or coffee ground material  New chest pain or pain under the shoulder blades  Painful or persistently difficult swallowing  New shortness of breath  Fever of 100F or higher  Black, tarry-looking stools  For urgent or emergent issues, a gastroenterologist can be reached at any hour by calling [phone].   DIET:  We do recommend a small meal at first, but then you may proceed to your regular diet.  Drink  plenty of fluids but you should avoid alcoholic beverages for 24 hours.  ACTIVITY:  You should plan to take it easy for the rest of today and you should NOT DRIVE or use heavy machinery until tomorrow (because of the sedation medicines used during the test).    FOLLOW UP: Our staff will call the number listed on your records the next business day following your procedure to check on you and address any questions or concerns that you may have regarding the information given to you following your procedure. If we do not reach you, we will leave a message.  However, if you are feeling well and you are not experiencing any problems, there is no need to return our call.  We will assume that you have returned to your regular daily activities without incident.  If any biopsies were taken you will be contacted by phone or by letter within the next 1-3 weeks.  Please call us at [phone] if you have not heard about the biopsies in 3 weeks.    SIGNATURES/CONFIDENTIALITY: You and/or your care partner have signed paperwork which will be entered into your electronic medical record.  These signatures attest to the fact that that the information above on your After Visit Summary has been reviewed and is understood.  Full responsibility of the confidentiality of this discharge information lies with you and/or your care-partner.    Handouts were given to your care partner on polyps  and a hiatal hernia. Your blood sugar was 95 in the recovery room. You may resume your current medications today. Await biopsy results. Please call if any questions or concerns.

## 2016-12-27 NOTE — Progress Notes (Signed)
No problems noted in the recovery room. maw 

## 2016-12-27 NOTE — Progress Notes (Signed)
Per Dr. Loletha Carrow okay for pt to resume prior diet today.  No dilataion diet needed for pt. maw

## 2016-12-27 NOTE — Op Note (Signed)
[place] Patient Name: Greg Rogers Procedure Date: 12/27/2016 2:47 PM MRN: [id number] Endoscopist: Mallie Mussel L. Loletha Carrow , MD Age: 76 Referring MD:  Date of Birth: 26-Jun-1941 Gender: Male Account #: [id number] Procedure:                Upper GI endoscopy Indications:              Dysphagia Medicines:                Monitored Anesthesia Care Procedure:                Pre-Anesthesia Assessment:                           - Prior to the procedure, a History and Physical                            was performed, and patient medications and                            allergies were reviewed. The patient's tolerance of                            previous anesthesia was also reviewed. The risks                            and benefits of the procedure and the sedation                            options and risks were discussed with the patient.                            All questions were answered, and informed consent                            was obtained. Anticoagulants: The patient has taken                            aspirin. It was decided not to withhold this                            medication prior to the procedure. ASA Grade                            Assessment: II - A patient with mild systemic                            disease. After reviewing the risks and benefits,                            the patient was deemed in satisfactory condition to                            undergo the procedure.  After obtaining informed consent, the endoscope was                            passed under direct vision. Throughout the                            procedure, the patient's blood pressure, pulse, and                            oxygen saturations were monitored continuously. The                            Endoscope was introduced through the mouth, and                            advanced to the second part of duodenum. The upper    GI endoscopy was accomplished without difficulty.                            The patient tolerated the procedure well. Scope In: Scope Out: Findings:                 A small hiatal hernia was present.                           A mild Schatzki ring (acquired) was found at the                            gastroesophageal junction. The scope was withdrawn.                            Dilation was performed with a Maloney dilator with                            no resistance at 77 Fr.                           The stomach was normal.                           The cardia and gastric fundus were normal on                            retroflexion.                           The examined duodenum was normal. Complications:            No immediate complications. Estimated Blood Loss:     Estimated blood loss: none. Impression:               - Small hiatal hernia.                           - Mild Schatzki ring. Dilated.                           -  Normal stomach.                           - Normal examined duodenum.                           - No specimens collected. Recommendation:           - Patient has a contact number available for                            emergencies. The signs and symptoms of potential                            delayed complications were discussed with the                            patient. Return to normal activities tomorrow.                            Written discharge instructions were provided to the                            patient.                           - Resume previous diet.                           - Continue present medications.                           - See the other procedure note for documentation of                            additional recommendations. Greg Rogers L. Loletha Carrow, MD 12/27/2016 3:20:05 PM This report has been signed electronically.

## 2016-12-27 NOTE — Progress Notes (Signed)
Patient awakening,vss,report to rn 

## 2016-12-27 NOTE — Op Note (Signed)
[place] Patient Name: Greg Rogers Procedure Date: 12/27/2016 2:59 PM MRN: [id number] Endoscopist: Mallie Mussel L. Loletha Carrow , MD Age: 76 Referring MD:  Date of Birth: Sep 01, 1940 Gender: Male Account #: [id number] Procedure:                Colonoscopy Indications:              Screening for colorectal malignant neoplasm Medicines:                Monitored Anesthesia Care Procedure:                Pre-Anesthesia Assessment:                           - Prior to the procedure, a History and Physical                            was performed, and patient medications and                            allergies were reviewed. The patient's tolerance of                            previous anesthesia was also reviewed. The risks                            and benefits of the procedure and the sedation                            options and risks were discussed with the patient.                            All questions were answered, and informed consent                            was obtained. Anticoagulants: The patient has taken                            aspirin. It was decided not to withhold this                            medication prior to the procedure. ASA Grade                            Assessment: II - A patient with mild systemic                            disease. After reviewing the risks and benefits,                            the patient was deemed in satisfactory condition to                            undergo the procedure.                           -  Prior to the procedure, a History and Physical                            was performed, and patient medications and                            allergies were reviewed. The patient's tolerance of                            previous anesthesia was also reviewed. The risks                            and benefits of the procedure and the sedation                            options and risks were discussed with the patient.                        All questions were answered, and informed consent                            was obtained. Anticoagulants: The patient has taken                            aspirin. It was decided not to withhold this                            medication prior to the procedure. ASA Grade                            Assessment: II - A patient with mild systemic                            disease. After reviewing the risks and benefits,                            the patient was deemed in satisfactory condition to                            undergo the procedure.                           After obtaining informed consent, the colonoscope                            was passed under direct vision. Throughout the                            procedure, the patient's blood pressure, pulse, and                            oxygen saturations were monitored continuously. The  Colonoscope was introduced through the anus and                            advanced to the the cecum, identified by                            appendiceal orifice and ileocecal valve. The                            colonoscopy was performed without difficulty. The                            patient tolerated the procedure well. The quality                            of the bowel preparation was good after lavage. The                            ileocecal valve, appendiceal orifice, and rectum                            were photographed. The quality of the bowel                            preparation was evaluated using the BBPS [facility]                            Bowel Preparation Scale) with scores of: Right                            Colon = 2, Transverse Colon = 2 and Left Colon = 2.                            The total BBPS score equals 6. The bowel                            preparation used was SUPREP. Scope In: 3:01:32 PM Scope Out: 3:15:56 PM Scope Withdrawal Time: 0 hours 10 minutes 6 seconds   Total Procedure Duration: 0 hours 14 minutes 24 seconds  Findings:                 The perianal and digital rectal examinations were                            normal.                           A 2 mm polyp was found in the proximal ascending                            colon. The polyp was sessile. The polyp was removed                            with a cold  biopsy forceps. Resection and retrieval                            were complete.                           The exam was otherwise without abnormality on                            direct and retroflexion views. Complications:            No immediate complications. Estimated Blood Loss:     Estimated blood loss: none. Impression:               - One 2 mm polyp in the proximal ascending colon,                            removed with a cold biopsy forceps. Resected and                            retrieved.                           - The examination was otherwise normal on direct                            and retroflexion views. Recommendation:           - Patient has a contact number available for                            emergencies. The signs and symptoms of potential                            delayed complications were discussed with the                            patient. Return to normal activities tomorrow.                            Written discharge instructions were provided to the                            patient.                           - Resume previous diet.                           - Continue present medications.                           - Await pathology results.                           - No repeat screening/surveillance colonoscopy due  to age. Henry L. Loletha Carrow, MD 12/27/2016 3:22:39 PM This report has been signed electronically.

## 2016-12-28 ENCOUNTER — Telehealth: Payer: Self-pay | Admitting: *Deleted

## 2016-12-28 ENCOUNTER — Other Ambulatory Visit: Payer: Self-pay | Admitting: Family Medicine

## 2016-12-28 NOTE — Telephone Encounter (Signed)
  Follow up Call-  Call back number 12/27/2016  Post procedure Call Back phone  # [phone]  Permission to leave phone message Yes  Some recent data might be hidden     Patient questions:  Do you have a fever, pain , or abdominal swelling? No. Pain Score  0 *  Have you tolerated food without any problems? Yes.    Have you been able to return to your normal activities? Yes.    Do you have any questions about your discharge instructions: Diet   No. Medications  No. Follow up visit  No.  Do you have questions or concerns about your Care? No.  Actions: * If pain score is 4 or above: No action needed, pain <4.

## 2016-12-29 DIAGNOSIS — M205X2 Other deformities of toe(s) (acquired), left foot: Secondary | ICD-10-CM | POA: Diagnosis not present

## 2016-12-31 ENCOUNTER — Encounter: Payer: Self-pay | Admitting: Gastroenterology

## 2017-02-01 ENCOUNTER — Other Ambulatory Visit: Payer: Self-pay | Admitting: *Deleted

## 2017-02-01 NOTE — Telephone Encounter (Signed)
Faxed refill request. Metformin Last office visit:   11/04/16 dysphagia ; 05/22/15 DM Last Filled:   11/04/16 (90 day supply) Does pt need OV? Please advise.

## 2017-02-02 MED ORDER — METFORMIN HCL 500 MG PO TABS: 1000.0000 mg | ORAL_TABLET | Freq: Two times a day (BID) | ORAL | 3 refills | Status: DC

## 2017-02-02 NOTE — Telephone Encounter (Signed)
Sent.  Has OV in July.

## 2017-02-03 ENCOUNTER — Telehealth: Payer: Self-pay | Admitting: *Deleted

## 2017-02-03 MED ORDER — METFORMIN HCL 500 MG PO TABS: 1000.0000 mg | ORAL_TABLET | Freq: Two times a day (BID) | ORAL | 3 refills | Status: DC

## 2017-02-03 NOTE — Telephone Encounter (Signed)
New Rx sent electronically with clarification

## 2017-02-10 ENCOUNTER — Encounter: Payer: Self-pay | Admitting: Family Medicine

## 2017-02-13 ENCOUNTER — Encounter: Payer: Self-pay | Admitting: Family Medicine

## 2017-02-14 ENCOUNTER — Telehealth: Payer: Medicare Other | Admitting: Family

## 2017-02-14 ENCOUNTER — Encounter: Payer: Self-pay | Admitting: Family Medicine

## 2017-02-14 DIAGNOSIS — R509 Fever, unspecified: Secondary | ICD-10-CM

## 2017-02-14 DIAGNOSIS — R059 Cough, unspecified: Secondary | ICD-10-CM

## 2017-02-14 DIAGNOSIS — R05 Cough: Secondary | ICD-10-CM

## 2017-02-14 MED ORDER — DOXYCYCLINE HYCLATE 100 MG PO TABS: 100.0000 mg | ORAL_TABLET | Freq: Two times a day (BID) | ORAL | 0 refills | Status: DC

## 2017-02-14 MED ORDER — BENZONATATE 100 MG PO CAPS: 100.0000 mg | ORAL_CAPSULE | Freq: Three times a day (TID) | ORAL | 0 refills | Status: DC | PRN

## 2017-02-14 NOTE — Progress Notes (Signed)
Subjective:   Greg Rogers. is a 76 y.o. male who presents for Medicare Annual/Subsequent preventive examination.  Review of Systems:  No ROS.  Medicare Wellness Visit. Additional risk factors are reflected in the social history.  Cardiac Risk Factors include: advanced age (>36men, >67 women);diabetes mellitus;dyslipidemia;hypertension;male gender     Objective:    Vitals: BP 134/72   Pulse 61   Ht 5\' 5"  (1.651 m)   Wt 153 lb 12.8 oz (69.8 kg)   SpO2 98%   BMI 25.59 kg/m   Body mass index is 25.59 kg/m.  Tobacco History  Smoking Status  . Never Smoker  Smokeless Tobacco  . Former Engineer, structural given: Not Answered   Past Medical History:  Diagnosis Date  . Abnormal GGT test    With otherwise normal work up.  . Arthritis   . Diabetes mellitus without complication ([place])   . Foot pain    Bilateral  . Hyperglycemia   . Hyperlipidemia   . Nephrolithiasis   . 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 Persistent disorder of initiating or maintaining sleep   . Pes planus   . Rosacea   . Snoring    Past Surgical History:  Procedure Laterality Date  . APPENDECTOMY  1949   at age 89  . CERVICAL FUSION  03/2016   C 3&4  . Cyst removed from breast  1960  . FOOT SURGERY  01/2009   Left  . HERNIA REPAIR  1956   at age 62 (single)  . HERNIA REPAIR  1960   (double)  . TONSILLECTOMY AND ADENOIDECTOMY  ~ 1950  . VASECTOMY  ~ 51   Family History  Problem Relation Age of Onset  . Heart disease Father 63       First MI  . Hyperlipidemia Father   . Heart attack Father   . Diabetes Brother   . Hyperlipidemia Brother   . Hypertension Brother   . Heart disease Brother        CAD s/p stent  . CAD Brother   . Heart disease Brother   . Heart disease Brother   . Diabetes Sister        DM2  . Hyperlipidemia Sister   . Arthritis Other   . Diabetes Other   . Prostate cancer Neg Hx   . Colon cancer Neg Hx    History  Sexual Activity  .  Sexual activity: No    Outpatient Encounter Prescriptions as of 02/21/2017  Medication Sig  . ACCU-CHEK FASTCLIX LANCETS MISC USE AS INSTRUCTED TO CHECK  BLOOD SUGAR ONCE DAILY  . aspirin 81 MG EC tablet Take 81 mg by mouth daily.   Marland Kitchen EPINEPHrine 0.3 mg/0.3 mL IJ SOAJ injection Inject 0.3 mLs (0.3 mg total) into the muscle once.  Marland Kitchen glimepiride (AMARYL) 2 MG tablet Take 1 tablet (2 mg total) by mouth daily before breakfast.  . glucose blood (ACCU-CHEK AVIVA PLUS) test strip Use to check blood sugar once daily.  Diagnosis:  E11.9   Non-insulin dependent.  . metFORMIN (GLUCOPHAGE) 500 MG tablet Take 2 tablets (1,000 mg total) by mouth 2 (two) times daily with a meal.  . Multiple Vitamin (MULTIVITAMIN) tablet Take 1 tablet by mouth daily.   . nitroGLYCERIN (NITROSTAT) 0.4 MG SL tablet Place 1 tablet (0.4 mg total) under the tongue every 5 (five) minutes as needed for chest pain (max 3 doses/15 min.call MD/to ER if used).  Marland Kitchen  omeprazole (PRILOSEC) 20 MG capsule Take 20 mg by mouth daily as needed. As needed  . psyllium (REGULOID) 0.52 G capsule Take 0.52 g by mouth 2 (two) times daily.  . sertraline (ZOLOFT) 50 MG tablet TAKE 1 TABLET BY MOUTH  DAILY  . simvastatin (ZOCOR) 10 MG tablet TAKE 1 TABLET BY MOUTH AT  BEDTIME  . benzonatate (TESSALON PERLES) 100 MG capsule Take 1 capsule (100 mg total) by mouth 3 (three) times daily as needed. (Patient not taking: Reported on 02/21/2017)  . [DISCONTINUED] doxycycline (VIBRA-TABS) 100 MG tablet Take 1 tablet (100 mg total) by mouth 2 (two) times daily. (Patient not taking: Reported on 02/21/2017)  . [DISCONTINUED] 0.9 %  sodium chloride infusion    No facility-administered encounter medications on file as of 02/21/2017.     Activities of Daily Living In your present state of health, do you have any difficulty performing the following activities: 02/21/2017  Hearing? N  Vision? N  Difficulty concentrating or making decisions? N  Walking or climbing stairs? N    Dressing or bathing? N  Doing errands, shopping? N  Preparing Food and eating ? N  Using the Toilet? N  In the past six months, have you accidently leaked urine? N  Do you have problems with loss of bowel control? N  Managing your Medications? N  Managing your Finances? N  Housekeeping or managing your Housekeeping? N  Some recent data might be hidden    Patient Care Team: Tonia Ghent, MD as PCP - Silvano Rusk, MD as Consulting Physician (Ophthalmology) Nicoletta Ba as Consulting Physician (Dentistry)   Assessment:    Physical assessment deferred to PCP.  Exercise Activities and Dietary recommendations Current Exercise Habits: Home exercise routine, Type of exercise: walking;Other - see comments (gardening, hunting, skiing), Intensity: Moderate  Goals    . HEMOGLOBIN A1C < 7.0 (pt-stated)          STARTING 02/21/17, I WILL DECREASE SUGAR/SWEETS INTAKE IN AN EFFORT TO DECREASE A1C.      Fall Risk Fall Risk  02/21/2017 12/17/2015 11/25/2014 12/07/2013 09/26/2012  Falls in the past year? Yes No No No No  Number falls in past yr: 1 - - - -  Injury with Fall? No - - - -   Depression Screen PHQ 2/9 Scores 02/21/2017 12/17/2015 11/25/2014 12/07/2013  PHQ - 2 Score 0 0 0 0    Cognitive Function PLEASE NOTE: A Mini-Cog screen was completed. Maximum score is 20. A value of 0 denotes this part of [place] MMSE was not completed or the patient failed this part of the Mini-Cog screening.   Mini-Cog Screening Orientation to Time - Max 5 pts Orientation to Place - Max 5 pts Registration - Max 3 pts Recall - Max 3 pts Language Repeat - Max 1 pts Language Follow 3 Step Command - Max 3 pts  MMSE - Mini Mental State Exam 02/21/2017 12/17/2015  Orientation to time 5 5  Orientation to Place 5 5  Registration 3 3  Attention/ Calculation 0 0  Recall 3 3  Language- name 2 objects 0 0  Language- repeat 1 1  Language- follow 3 step command 3 3  Language- read & follow  direction 0 0  Write a sentence 0 0  Copy design 0 0  Total score 20 20        Immunization History  Administered Date(s) Administered  . Influenza Whole 05/21/2013  . Influenza-Unspecified 05/29/2014, 05/21/2015, 04/12/2016  . Pneumococcal Polysaccharide-23 08/17/2003  .  Td 08/16/2004, 09/26/2012   Screening Tests Health Maintenance  Topic Date Due  . URINE MICROALBUMIN  12/16/2016  . INFLUENZA VACCINE  03/16/2017  . HEMOGLOBIN A1C  06/19/2017  . FOOT EXAM  07/21/2017  . OPHTHALMOLOGY EXAM  08/03/2017  . TETANUS/TDAP  09/26/2022  . COLONOSCOPY  12/28/2026      Plan:    Follow-up w/ PCP as scheduled.  I have personally reviewed and noted the following in the patient's chart:   . Medical and social history . Use of alcohol, tobacco or illicit drugs  . Current medications and supplements . Functional ability and status . Nutritional status . Physical activity . Advanced directives . List of other physicians . Vitals . Screenings to include cognitive, depression, and falls . Referrals and appointments  In addition, I have reviewed and discussed with patient certain preventive protocols, quality metrics, and best practice recommendations. A written personalized care plan for preventive services as well as general preventive health recommendations were provided to patient.     Dorrene German, RN  02/21/2017

## 2017-02-14 NOTE — Progress Notes (Signed)
PCP notes:  Health maintenance: Last urine micro 12/17/15.  Abnormal screenings:  Hearing-failed highest pitch in L ear only.  Patient concerns:  Following w/ [facility] for "crooked" toe of L foot. Plans to have toe amputated once A1C < 7.  Nurse concerns: None  Next PCP appt: 03/03/17 @ 12:15pm  I reviewed health advisor's note, was available for consultation on the day of service listed in this note, and agree with documentation and plan. Elsie Stain, MD.

## 2017-02-14 NOTE — Progress Notes (Signed)
We are sorry that you are not feeling well.  Here is how we plan to help!  Based on your presentation I believe you most likely have A cough due to bacteria.  When patients have a fever and a productive cough with a change in color or increased sputum production, we are concerned about bacterial bronchitis.  If left untreated it can progress to pneumonia.  If your symptoms do not improve with your treatment plan it is important that you contact your provider.   I hve prescribed Doxycycline 100 mg twice a day for 7 days     In addition you may use A non-prescription cough medication called Robitussin DAC. Take 2 teaspoons every 8 hours or Delsym: take 2 teaspoons every 12 hours., A non-prescription cough medication called Mucinex DM: take 2 tablets every 12 hours. and A prescription cough medication called Tessalon Perles 100mg . You may take 1-2 capsules every 8 hours as needed for your cough.    From your responses in the eVisit questionnaire you describe inflammation in the upper respiratory tract which is causing a significant cough.  This is commonly called Bronchitis and has four common causes:    Allergies  Viral Infections  Acid Reflux  Bacterial Infection Allergies, viruses and acid reflux are treated by controlling symptoms or eliminating the cause. An example might be a cough caused by taking certain blood pressure medications. You stop the cough by changing the medication. Another example might be a cough caused by acid reflux. Controlling the reflux helps control the cough.  USE OF BRONCHODILATOR ("RESCUE") INHALERS: There is a risk from using your bronchodilator too frequently.  The risk is that over-reliance on a medication which only relaxes the muscles surrounding the breathing tubes can reduce the effectiveness of medications prescribed to reduce swelling and congestion of the tubes themselves.  Although you feel brief relief from the bronchodilator inhaler, your asthma may  actually be worsening with the tubes becoming more swollen and filled with mucus.  This can delay other crucial treatments, such as oral steroid medications. If you need to use a bronchodilator inhaler daily, several times per day, you should discuss this with your provider.  There are probably better treatments that could be used to keep your asthma under control.     HOME CARE . Only take medications as instructed by your medical team. . Complete the entire course of an antibiotic. . Drink plenty of fluids and get plenty of rest. . Avoid close contacts especially the very young and the elderly . Cover your mouth if you cough or cough into your sleeve. . Always remember to wash your hands . A steam or ultrasonic humidifier can help congestion.   GET HELP RIGHT AWAY IF: . You develop worsening fever. . You become short of breath . You cough up blood. . Your symptoms persist after you have completed your treatment plan MAKE SURE YOU   Understand these instructions.  Will watch your condition.  Will get help right away if you are not doing well or get worse.  Your e-visit answers were reviewed by a board certified advanced clinical practitioner to complete your personal care plan.  Depending on the condition, your plan could have included both over the counter or prescription medications. If there is a problem please reply  once you have received a response from your provider. Your safety is important to [place].  If you have drug allergies check your prescription carefully.    You can use  MyChart to ask questions about today's visit, request a non-urgent call back, or ask for a work or school excuse for 24 hours related to this e-Visit. If it has been greater than 24 hours you will need to follow up with your provider, or enter a new e-Visit to address those concerns. You will get an e-mail in the next two days asking about your experience.  I hope that your e-visit has been valuable and will  speed your recovery. Thank you for using e-visits.

## 2017-02-15 ENCOUNTER — Other Ambulatory Visit: Payer: Self-pay | Admitting: Family

## 2017-02-15 ENCOUNTER — Other Ambulatory Visit: Payer: Self-pay | Admitting: Family Medicine

## 2017-02-16 ENCOUNTER — Encounter: Payer: Self-pay | Admitting: Family Medicine

## 2017-02-21 ENCOUNTER — Ambulatory Visit (INDEPENDENT_AMBULATORY_CARE_PROVIDER_SITE_OTHER): Payer: Medicare Other

## 2017-02-21 ENCOUNTER — Other Ambulatory Visit: Payer: Self-pay | Admitting: Family Medicine

## 2017-02-21 ENCOUNTER — Other Ambulatory Visit (INDEPENDENT_AMBULATORY_CARE_PROVIDER_SITE_OTHER): Payer: Medicare Other

## 2017-02-21 VITALS — BP 134/72 | HR 61 | Ht 65.0 in | Wt 153.8 lb

## 2017-02-21 DIAGNOSIS — E119 Type 2 diabetes mellitus without complications: Secondary | ICD-10-CM

## 2017-02-21 DIAGNOSIS — Z125 Encounter for screening for malignant neoplasm of prostate: Secondary | ICD-10-CM | POA: Diagnosis not present

## 2017-02-21 DIAGNOSIS — Z0000 Encounter for general adult medical examination without abnormal findings: Secondary | ICD-10-CM

## 2017-02-21 NOTE — Patient Instructions (Signed)
Greg Rogers , Thank you for taking time to come for your Medicare Wellness Visit. I appreciate your ongoing commitment to your health goals. Please review the following plan we discussed and let me know if I can assist you in the future.   These are the goals we discussed: Goals    . HEMOGLOBIN A1C < 7.0 (pt-stated)          STARTING 02/21/17, I WILL DECREASE SUGAR/SWEETS INTAKE IN AN EFFORT TO DECREASE A1C.       This is a list of the screening recommended for you and due dates:  Health Maintenance  Topic Date Due  . Urine Protein Check  12/16/2016  . Flu Shot  03/16/2017  . Hemoglobin A1C  06/19/2017  . Complete foot exam   07/21/2017  . Eye exam for diabetics  08/03/2017  . Tetanus Vaccine  09/26/2022  . Colon Cancer Screening  12/28/2026   Preventive Care for Adults  A healthy lifestyle and preventive care can promote health and wellness. Preventive health guidelines for adults include the following key practices.  . A routine yearly physical is a good way to check with your health care provider about your health and preventive screening. It is a chance to share any concerns and updates on your health and to receive a thorough exam.  . Visit your dentist for a routine exam and preventive care every 6 months. Brush your teeth twice a day and floss once a day. Good oral hygiene prevents tooth decay and gum disease.  . The frequency of eye exams is based on your age, health, family medical history, use  of contact lenses, and other factors. Follow your health care provider's ecommendations for frequency of eye exams.  . Eat a healthy diet. Foods like vegetables, fruits, whole grains, low-fat dairy products, and lean protein foods contain the nutrients you need without too many calories. Decrease your intake of foods high in solid fats, added sugars, and salt. Eat the right amount of calories for you. Get information about a proper diet from your health care provider, if  necessary.  . Regular physical exercise is one of the most important things you can do for your health. Most adults should get at least 150 minutes of moderate-intensity exercise (any activity that increases your heart rate and causes you to sweat) each week. In addition, most adults need muscle-strengthening exercises on 2 or more days a week.  Silver Sneakers may be a benefit available to you. To determine eligibility, you may visit the website: www.silversneakers.com or contact program at 217-213-8261 Mon-Fri between 8AM-8PM.   . Maintain a healthy weight. The body mass index (BMI) is a screening tool to identify possible weight problems. It provides an estimate of body fat based on height and weight. Your health care provider can find your BMI and can help you achieve or maintain a healthy weight.   For adults 20 years and older: ? A BMI below 18.5 is considered underweight. ? A BMI of 18.5 to 24.9 is normal. ? A BMI of 25 to 29.9 is considered overweight. ? A BMI of 30 and above is considered obese.   . Maintain normal blood lipids and cholesterol levels by exercising and minimizing your intake of saturated fat. Eat a balanced diet with plenty of fruit and vegetables. Blood tests for lipids and cholesterol should begin at age 34 and be repeated every 5 years. If your lipid or cholesterol levels are high, you are over 50, or you  are at high risk for heart disease, you may need your cholesterol levels checked more frequently. Ongoing high lipid and cholesterol levels should be treated with medicines if diet and exercise are not working.  . If you smoke, find out from your health care provider how to quit. If you do not use tobacco, please do not start.  . If you choose to drink alcohol, please do not consume more than 2 drinks per day. One drink is considered to be 12 ounces (355 mL) of beer, 5 ounces (148 mL) of wine, or 1.5 ounces (44 mL) of liquor.  . If you are 66-51 years old, ask your  health care provider if you should take aspirin to prevent strokes.  . Use sunscreen. Apply sunscreen liberally and repeatedly throughout the day. You should seek shade when your shadow is shorter than you. Protect yourself by wearing long sleeves, pants, a wide-brimmed hat, and sunglasses year round, whenever you are outdoors.  . Once a month, do a whole body skin exam, using a mirror to look at the skin on your back. Tell your health care provider of new moles, moles that have irregular borders, moles that are larger than a pencil eraser, or moles that have changed in shape or color.

## 2017-02-22 LAB — COMPREHENSIVE METABOLIC PANEL
ALK PHOS: 50 U/L (ref 39–117)
ALT: 19 U/L (ref 0–53)
AST: 20 U/L (ref 0–37)
Albumin: 4.1 g/dL (ref 3.5–5.2)
BUN: 19 mg/dL (ref 6–23)
CHLORIDE: 104 meq/L (ref 96–112)
CO2: 28 mEq/L (ref 19–32)
Calcium: 9.1 mg/dL (ref 8.4–10.5)
Creatinine, Ser: 1 mg/dL (ref 0.40–1.50)
GFR: 77.56 mL/min (ref >60.00)
Glucose, Bld: 72 mg/dL (ref 70–99)
POTASSIUM: 4.3 meq/L (ref 3.5–5.1)
SODIUM: 140 meq/L (ref 135–145)
TOTAL PROTEIN: 6.5 g/dL (ref 6.0–8.3)
Total Bilirubin: 0.4 mg/dL (ref 0.2–1.2)

## 2017-02-22 LAB — LIPID PANEL
Cholesterol: 155 mg/dL (ref 0–200)
HDL: 45.9 mg/dL (ref >39.00)
LDL CALC: 74 mg/dL (ref 0–99)
NONHDL: 109.15
Total CHOL/HDL Ratio: 3
Triglycerides: 176 mg/dL — ABNORMAL HIGH (ref 0.0–149.0)
VLDL: 35.2 mg/dL (ref 0.0–40.0)

## 2017-02-22 NOTE — Addendum Note (Signed)
Addended by: Ellamae Sia on: 02/22/2017 09:10 AM   Modules accepted: Orders

## 2017-03-02 ENCOUNTER — Encounter: Payer: Self-pay | Admitting: Family Medicine

## 2017-03-03 ENCOUNTER — Encounter: Payer: Self-pay | Admitting: Family Medicine

## 2017-03-03 ENCOUNTER — Ambulatory Visit (INDEPENDENT_AMBULATORY_CARE_PROVIDER_SITE_OTHER): Payer: Medicare Other | Admitting: Family Medicine

## 2017-03-03 VITALS — BP 134/72 | HR 61 | Temp 98.3°F | Ht 65.0 in | Wt 154.0 lb

## 2017-03-03 DIAGNOSIS — R454 Irritability and anger: Secondary | ICD-10-CM | POA: Diagnosis not present

## 2017-03-03 DIAGNOSIS — E785 Hyperlipidemia, unspecified: Secondary | ICD-10-CM

## 2017-03-03 DIAGNOSIS — M25569 Pain in unspecified knee: Secondary | ICD-10-CM | POA: Diagnosis not present

## 2017-03-03 DIAGNOSIS — T63481A Toxic effect of venom of other arthropod, accidental (unintentional), initial encounter: Secondary | ICD-10-CM | POA: Diagnosis not present

## 2017-03-03 DIAGNOSIS — E119 Type 2 diabetes mellitus without complications: Secondary | ICD-10-CM | POA: Diagnosis not present

## 2017-03-03 DIAGNOSIS — Z0000 Encounter for general adult medical examination without abnormal findings: Secondary | ICD-10-CM

## 2017-03-03 LAB — MICROALBUMIN / CREATININE URINE RATIO
CREATININE, U: 89.6 mg/dL
MICROALB/CREAT RATIO: 0.8 mg/g (ref 0.0–30.0)
Microalb, Ur: <0.7 mg/dL (ref 0.0–1.9)

## 2017-03-03 MED ORDER — GLIMEPIRIDE 2 MG PO TABS: 2.0000 mg | ORAL_TABLET | Freq: Every day | ORAL | 3 refills | Status: DC

## 2017-03-03 NOTE — Patient Instructions (Addendum)
Go to the lab on the way out.  We'll contact you with your lab report. A1c later in the year- August.  We can make plans at that point about follow up.  Check with your insurance to see if they will cover the shingrix shot. I would get a flu shot each fall.   Update me as needed.  Reasonable to try claritin as needed for runny nose.  Take care.  Glad to see you.

## 2017-03-03 NOTE — Progress Notes (Signed)
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  Advance directive-daughter Angelique Blonder designated if patient were incapacitated.  shingrix d/w pt.  See AVS.   Prostate cancer screening and PSA options (with potential risks and benefits of testing vs not testing) were discussed along with recent recs/guidelines.  He declined testing PSA at this point.  Patient concerns:  Following w/ [facility] for "crooked" toe of L foot. Plans to have toe amputated once A1C < 7. D/w pt.   Not yet due for A1c.   Swallowing is improved from prev.   His post nasal gtt is better in the meantime.    L knee trouble.   Was still able to go skiing in [place] recently.  He is still gardening.  Noted some pain with kneeling and then leaning to the L side.  Resolved when the pressure is off.    Wasp sting on the L forearm recently.  It resolved in the meantime.  He prev had exaggerated local reaction on the L forearm.  He didn't have systemic sx.  D/w pt.  Reasonable to keep epipen in case.    Diabetes: not yet due for A1c but MALB pending.  Sugar 115-120 in the meantime.  No lows.  Compliant with meds.   Mood.  Family is okay, his mood is fair.  Still living at home with wife.  Situation is tolerable.  D/w pt.  No SI/HI.    Elevated Cholesterol: Using medications without problems:yes Muscle aches: not but but did initially- resolved.  Diet compliance:yes, encouraged.   Exercise:yes  PMH and SH reviewed.   Vital signs, Meds and allergies reviewed.  ROS: Per HPI unless specifically indicated in ROS section   GEN: nad, alert and oriented HEENT: mucous membranes moist NECK: supple w/o LA CV: rrr PULM: ctab, no inc wob ABD: soft, +bs EXT: no edema

## 2017-03-04 ENCOUNTER — Other Ambulatory Visit: Payer: Self-pay

## 2017-03-04 DIAGNOSIS — M25569 Pain in unspecified knee: Secondary | ICD-10-CM | POA: Insufficient documentation

## 2017-03-04 DIAGNOSIS — Z0000 Encounter for general adult medical examination without abnormal findings: Secondary | ICD-10-CM | POA: Insufficient documentation

## 2017-03-04 DIAGNOSIS — T63481A Toxic effect of venom of other arthropod, accidental (unintentional), initial encounter: Secondary | ICD-10-CM | POA: Insufficient documentation

## 2017-03-04 NOTE — Assessment & Plan Note (Signed)
Mood is fair. No suicidal or homicidal intent. Tolerating medicine. Continue as is. He agrees.

## 2017-03-04 NOTE — Assessment & Plan Note (Signed)
No change in meds at this point. Continue work on diet and exercise. Microalbumin negative today. See notes on labs. Recheck A1c next month. We can make plans about follow-up at that point, depending on his A1c. He agrees. >25 minutes spent in face to face time with patient, >50% spent in counselling or coordination of care.

## 2017-03-04 NOTE — Assessment & Plan Note (Signed)
He had an exaggerated local reaction but no systemic symptoms otherwise. Discussed with patient about keeping EpiPen, if needed. He agrees.

## 2017-03-04 NOTE — Assessment & Plan Note (Signed)
Reasonable to continue statin. He is working on diet. He is exercising. Labs discussed with patient. He agrees.

## 2017-03-04 NOTE — Assessment & Plan Note (Signed)
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  Advance directive-daughter Angelique Blonder designated if patient were incapacitated.  shingrix d/w pt.  See AVS.   Prostate cancer screening and PSA options (with potential risks and benefits of testing vs not testing) were discussed along with recent recs/guidelines.  He declined testing PSA at this point.

## 2017-03-15 ENCOUNTER — Encounter: Payer: Self-pay | Admitting: Family Medicine

## 2017-03-15 ENCOUNTER — Other Ambulatory Visit: Payer: Self-pay | Admitting: *Deleted

## 2017-03-15 MED ORDER — GLUCOSE BLOOD VI STRP: ORAL_STRIP | 99 refills | Status: DC

## 2017-03-16 HISTORY — PX: OTHER SURGICAL HISTORY: SHX169

## 2017-03-17 ENCOUNTER — Encounter: Payer: Self-pay | Admitting: Family Medicine

## 2017-03-21 ENCOUNTER — Other Ambulatory Visit: Payer: Self-pay | Admitting: Family Medicine

## 2017-03-21 ENCOUNTER — Other Ambulatory Visit (INDEPENDENT_AMBULATORY_CARE_PROVIDER_SITE_OTHER): Payer: Medicare Other

## 2017-03-21 DIAGNOSIS — E119 Type 2 diabetes mellitus without complications: Secondary | ICD-10-CM

## 2017-03-21 LAB — HEMOGLOBIN A1C: HEMOGLOBIN A1C: 6.2 % (ref 4.6–6.5)

## 2017-03-22 ENCOUNTER — Encounter: Payer: Self-pay | Admitting: *Deleted

## 2017-03-28 ENCOUNTER — Encounter: Payer: Self-pay | Admitting: Family Medicine

## 2017-03-29 DIAGNOSIS — M2042 Other hammer toe(s) (acquired), left foot: Secondary | ICD-10-CM | POA: Diagnosis not present

## 2017-03-29 DIAGNOSIS — M205X2 Other deformities of toe(s) (acquired), left foot: Secondary | ICD-10-CM | POA: Diagnosis not present

## 2017-04-14 DIAGNOSIS — Z899 Acquired absence of limb, unspecified: Secondary | ICD-10-CM | POA: Diagnosis not present

## 2017-04-14 DIAGNOSIS — M205X2 Other deformities of toe(s) (acquired), left foot: Secondary | ICD-10-CM | POA: Diagnosis not present

## 2017-04-20 DIAGNOSIS — Z899 Acquired absence of limb, unspecified: Secondary | ICD-10-CM | POA: Diagnosis not present

## 2017-05-17 ENCOUNTER — Encounter: Payer: Self-pay | Admitting: Family Medicine

## 2017-05-18 ENCOUNTER — Ambulatory Visit (INDEPENDENT_AMBULATORY_CARE_PROVIDER_SITE_OTHER): Payer: Medicare Other | Admitting: Family Medicine

## 2017-05-18 ENCOUNTER — Encounter: Payer: Self-pay | Admitting: Family Medicine

## 2017-05-18 VITALS — BP 114/64 | HR 62 | Temp 98.3°F | Wt 160.0 lb

## 2017-05-18 DIAGNOSIS — T63421A Toxic effect of venom of ants, accidental (unintentional), initial encounter: Secondary | ICD-10-CM

## 2017-05-18 DIAGNOSIS — E119 Type 2 diabetes mellitus without complications: Secondary | ICD-10-CM

## 2017-05-18 MED ORDER — TRIAMCINOLONE ACETONIDE 0.1 % EX CREA: 1.0000 "application " | TOPICAL_CREAM | Freq: Two times a day (BID) | CUTANEOUS | 1 refills | Status: DC | PRN

## 2017-05-18 NOTE — Patient Instructions (Addendum)
Recheck A1c in about 3 months at a lab visit.   If your A1c is fine, then you may not need an office visit at that point.  Let's get the lab and go from there.  Use triamcinolone if needed for itching.   Take care.  Glad to see you.

## 2017-05-18 NOTE — Progress Notes (Signed)
Fire ants stung him in the R hand and forearm, dorsally.  Locally red and itchy yesterday, clearly better today.  No fevers.  No systemic sx.  Cleaned the area locally.  Used cortisone cream and neosporin locally.    DM2.  Sugar has been 110-130 in the AMs.  No lows.  D/w pt.  Last A1c 6.2.    Meds, vitals, and allergies reviewed.   ROS: Per HPI unless specifically indicated in ROS section   nad ncat 7 bite sites on the R and and forearm, dorsally.  1 looks irritated, 2 look bruised locally, 4 are unremarkable.  No spreading erythema. Normal grip, NV intact.  No fluctuant mass.

## 2017-05-19 DIAGNOSIS — T63421A Toxic effect of venom of ants, accidental (unintentional), initial encounter: Secondary | ICD-10-CM | POA: Insufficient documentation

## 2017-05-19 NOTE — Assessment & Plan Note (Signed)
Recheck A1c in about 3 months at a lab visit.   If his A1c is fine, then he may not need an office visit at that point.  We'll get the lab and go from there.  He agrees. No change in meds at this point.

## 2017-05-19 NOTE — Assessment & Plan Note (Signed)
None appear infected. Needs supportive local care only. Rx done for triamcinolone for patient to have in case he needs it for itching. No systemic symptoms. Update me as needed. He agrees.

## 2017-08-04 DIAGNOSIS — E119 Type 2 diabetes mellitus without complications: Secondary | ICD-10-CM | POA: Diagnosis not present

## 2017-08-04 DIAGNOSIS — H5213 Myopia, bilateral: Secondary | ICD-10-CM | POA: Diagnosis not present

## 2017-09-01 ENCOUNTER — Other Ambulatory Visit: Payer: Self-pay | Admitting: Family Medicine

## 2017-10-05 ENCOUNTER — Other Ambulatory Visit (INDEPENDENT_AMBULATORY_CARE_PROVIDER_SITE_OTHER): Payer: Medicare Other

## 2017-10-05 DIAGNOSIS — E119 Type 2 diabetes mellitus without complications: Secondary | ICD-10-CM | POA: Diagnosis not present

## 2017-10-05 LAB — HEMOGLOBIN A1C: HEMOGLOBIN A1C: 6.2 % (ref 4.6–6.5)

## 2017-10-09 ENCOUNTER — Other Ambulatory Visit: Payer: Self-pay | Admitting: Family Medicine

## 2017-10-09 DIAGNOSIS — E119 Type 2 diabetes mellitus without complications: Secondary | ICD-10-CM

## 2017-10-27 ENCOUNTER — Encounter: Payer: Self-pay | Admitting: Family Medicine

## 2017-11-01 ENCOUNTER — Ambulatory Visit (INDEPENDENT_AMBULATORY_CARE_PROVIDER_SITE_OTHER): Payer: Medicare Other | Admitting: Family Medicine

## 2017-11-01 ENCOUNTER — Encounter: Payer: Self-pay | Admitting: Family Medicine

## 2017-11-01 VITALS — BP 112/60 | HR 67 | Temp 97.9°F | Wt 160.5 lb

## 2017-11-01 DIAGNOSIS — S060X0A Concussion without loss of consciousness, initial encounter: Secondary | ICD-10-CM | POA: Diagnosis not present

## 2017-11-01 NOTE — Patient Instructions (Signed)
[name] will call about your referral. See her on the way out.  Take care.  Glad to see you.  

## 2017-11-01 NOTE — Progress Notes (Signed)
Was skiing in [place] recently.  He had a great time initially.  He was skiing, fell.  No LOC.  Was wearing a helmet.  He had an episode after the fact where he didn't remember all of the events.  He was still up and moving, but he could function normally but w/o recollection of that period of time.   He didn't think there was any trigger for the fall.  He didn't feel himself going down but he remembers hitting "pretty hard."    No falls.  No CP.  Not SOB.  No BLE edema.  No syncope.    He was worried about possible syncope/near syncope causing the fall.  He wasn't fasting at the time.  He doesn't have hx of syncope o/w.   He usually walks his dogs in the AM.  He noted slight change in gait, noted more this AM, with more unsteadiness.  It predates this AM, over the last 6-8 months.  Not a foot drop but slight dec in max height of stride with R vs L foot.  L side still feels more normal.    He isn't skiing anymore this spring.  He can't be talked out of stopping skiing in general.  We discussed.  I am worried that he'll have sig injury vs death, he is aware.    Meds, vitals, and allergies reviewed.   ROS: Per HPI unless specifically indicated in ROS section   GEN: nad, alert and oriented HEENT: mucous membranes moist NECK: supple w/o LA CV: rrr PULM: ctab, no inc wob ABD: soft, +bs EXT: no edema CN 2-12 wnl B, S/S/DTR wnl x4 He does have a subtle gait asymmetry.  He does not have an obvious foot drop but he does appear to have slight decrease in height of his stride with his right versus left foot.  Diabetic foot exam: Normal inspection No skin breakdown No calluses  Normal DP pulses Normal sensation to light touch and monofilament Nails normal

## 2017-11-02 ENCOUNTER — Ambulatory Visit (INDEPENDENT_AMBULATORY_CARE_PROVIDER_SITE_OTHER)
Admission: RE | Admit: 2017-11-02 | Discharge: 2017-11-02 | Disposition: A | Discharge status: Home / Self Care | Payer: Medicare Other | Source: Ambulatory Visit | Attending: Family Medicine | Admitting: Family Medicine

## 2017-11-02 DIAGNOSIS — S0990XA Unspecified injury of head, initial encounter: Secondary | ICD-10-CM | POA: Diagnosis not present

## 2017-11-02 DIAGNOSIS — S060X0A Concussion without loss of consciousness, initial encounter: Secondary | ICD-10-CM

## 2017-11-02 DIAGNOSIS — R413 Other amnesia: Secondary | ICD-10-CM | POA: Diagnosis not present

## 2017-11-02 NOTE — Assessment & Plan Note (Addendum)
Presumed, with episode of memory loss after the fall.  I think it would be really atypical for this to be syncope.  He does remember the fall.  Either way would still be assumed that he had a concussion given the memory changes after the event.  He has no seizure activity noted.  We talked about safety.  He does not want to stop skiing.  We talked about risk reduction.  I advised him not to do anything hazardous.  See above. Fortunately in his situation the skiing season is over.  Check head CT.  Refer to neurology.  Rationale discussed with patient.  He understood.  >25 minutes spent in face to face time with patient, >50% spent in counselling or coordination of care.

## 2017-11-07 ENCOUNTER — Other Ambulatory Visit: Payer: Self-pay | Admitting: Family Medicine

## 2017-11-08 ENCOUNTER — Telehealth: Payer: Self-pay | Admitting: Diagnostic Neuroimaging

## 2017-11-08 ENCOUNTER — Encounter: Payer: Self-pay | Admitting: Diagnostic Neuroimaging

## 2017-11-08 ENCOUNTER — Ambulatory Visit (INDEPENDENT_AMBULATORY_CARE_PROVIDER_SITE_OTHER): Payer: Medicare Other | Admitting: Diagnostic Neuroimaging

## 2017-11-08 VITALS — BP 150/74 | HR 63 | Ht 65.0 in | Wt 160.0 lb

## 2017-11-08 DIAGNOSIS — R4020 Unspecified coma: Secondary | ICD-10-CM

## 2017-11-08 DIAGNOSIS — W19XXXA Unspecified fall, initial encounter: Secondary | ICD-10-CM | POA: Diagnosis not present

## 2017-11-08 NOTE — Progress Notes (Signed)
[facility]  PATIENT: Greg Rogers. DOB: Sep 17, 1940  REFERRING CLINICIAN: Orlinda Blalock, MD HISTORY FROM: patient and chart review  REASON FOR VISIT: new consult   HISTORICAL  CHIEF COMPLAINT:  Chief Complaint  Patient presents with  . NP  Dr. Damita Dunnings  . Concussion w/o LOC    has had 2 episodes (each time skiing in 10/2016 and 2019).  First time had loc for 2 min.  Trying to find cause.     HISTORY OF PRESENT ILLNESS:  77 year old male here for evaluation of falling down, loss of consciousness, amnesia.  February 2018 patient was on the ski slope, when he woke up after he had apparently fallen down.  Apparently he lost consciousness or fell down.  Patient is not sure.  He was helped by ski patrol staff down the rest of the mountain where he rested.  Patient felt normal the rest of the day.  The next day patient was able to continue skiing without difficulty.  No apparent provoking factor.  No convulsions, tongue biting, incontinence.  No postictal confusion.  October 28, 2017 patient was also skiing, with family, when all of a sudden he remembered falling down and hitting his head.  Patient apparently stood up, put his keys back on an ski down to the base, walk to the bus and then had amnesia for the previous 9 minutes.  Patient had no further symptoms in the rest the day.  Since that time no further symptoms.  Patient feels well.  No other numbness, tingling, headaches or other problems.  Occasionally he has some mild gait difficulty and some difficulty with his right leg.  No prior history of seizure or syncope.   REVIEW OF SYSTEMS: Full 14 system review of systems performed and negative with exception of: As per HPI.    ALLERGIES: Allergies  Allergen Reactions  . Bee Venom   . Erythromycin     Oral ulcers (but tolerates zithromax) No problem with erythromycin but does not tolerate the "old mycin drugs"  . Fire [employer]     Local reaction, irritation.   .  Pneumococcal Vaccine Polyvalent     REACTION: rash  . Wasp Venom     Exaggerated local reaction    HOME MEDICATIONS: Outpatient Medications Prior to Visit  Medication Sig Dispense Refill  . ACCU-CHEK FASTCLIX LANCETS MISC USE AS INSTRUCTED TO CHECK  BLOOD SUGAR ONCE DAILY 102 each 3  . aspirin 81 MG EC tablet Take 81 mg by mouth daily.     Marland Kitchen EPINEPHrine 0.3 mg/0.3 mL IJ SOAJ injection Inject 0.3 mLs (0.3 mg total) into the muscle once. (Patient taking differently: Inject 0.3 mg into the muscle as needed. ) 2 Device 1  . glimepiride (AMARYL) 2 MG tablet Take 1 tablet (2 mg total) by mouth daily before breakfast. 90 tablet 3  . glucose blood (ACCU-CHEK AVIVA PLUS) test strip Use to check blood sugar once daily.  Diagnosis:  E11.9   Non-insulin dependent. 100 each prn  . metFORMIN (GLUCOPHAGE) 500 MG tablet TAKE 2 TABLETS BY MOUTH  TWICE A DAY WITH A MEAL 360 tablet 3  . Multiple Vitamin (MULTIVITAMIN) tablet Take 1 tablet by mouth daily.     . nitroGLYCERIN (NITROSTAT) 0.4 MG SL tablet Place 1 tablet (0.4 mg total) under the tongue every 5 (five) minutes as needed for chest pain (max 3 doses/15 min.call MD/to ER if used). 25 tablet 3  . omeprazole (PRILOSEC) 20 MG capsule Take 20  mg by mouth daily as needed. As needed    . sertraline (ZOLOFT) 50 MG tablet TAKE 1 TABLET BY MOUTH  DAILY 90 tablet 2  . simvastatin (ZOCOR) 10 MG tablet TAKE 1 TABLET BY MOUTH AT  BEDTIME 90 tablet 1  . triamcinolone cream (KENALOG) 0.1 % Apply 1 application topically 2 (two) times daily as needed. 30 g 1   No facility-administered medications prior to visit.     PAST MEDICAL HISTORY: Past Medical History:  Diagnosis Date  . Abnormal GGT test    With otherwise normal work up.  . Arthritis   . Diabetes mellitus without complication ([place])   . Foot pain    Bilateral  . Hyperglycemia   . Hyperlipidemia   . Nephrolithiasis   . 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 Persistent disorder of  initiating or maintaining sleep   . Pes planus   . Rosacea   . Snoring     PAST SURGICAL HISTORY: Past Surgical History:  Procedure Laterality Date  . APPENDECTOMY  1949   at age 71  . CERVICAL FUSION  03/2016   C 3&4  . Cyst removed from breast  1960  . FOOT SURGERY  01/2009   Left  . HERNIA REPAIR  1956   at age 44 (single)  . HERNIA REPAIR  1960   (double)  . TONSILLECTOMY AND ADENOIDECTOMY  ~ 1950  . VASECTOMY  ~ 1984    FAMILY HISTORY: Family History  Problem Relation Age of Onset  . Heart disease Father 72       First MI  . Hyperlipidemia Father   . Heart attack Father   . Diabetes Brother   . Hyperlipidemia Brother   . Hypertension Brother   . Heart disease Brother        CAD s/p stent  . CAD Brother   . Heart disease Brother   . Heart disease Brother   . Diabetes Sister        DM2  . Hyperlipidemia Sister   . Arthritis Other   . Diabetes Other   . Prostate cancer Neg Hx   . Colon cancer Neg Hx     SOCIAL HISTORY:  Social History   Socioeconomic History  . Marital status: Married    Spouse name: Not on file  . Number of children: 4  . Years of education: Not on file  . Highest education level: Not on file  Occupational History  . Occupation: Retired Nov. 2007 27-1/2 years of service    Employer: RETIRED    Comment: Advice worker  . Occupation: Works part time at [employer] job.  Social Needs  . Financial resource strain: Not on file  . Food insecurity:    Worry: Not on file    Inability: Not on file  . Transportation needs:    Medical: Not on file    Non-medical: Not on file  Tobacco Use  . Smoking status: Never Smoker  . Smokeless tobacco: Former Systems developer    Types: Chew  Substance and Sexual Activity  . Alcohol use: Yes    Alcohol/week: 7.8 - 12.0 oz    Types: 7 - 14 Glasses of wine, 6 Standard drinks or equivalent per week    Comment: 1-2 glasses of wine at night  . Drug use: No  . Sexual activity: Never    Lifestyle  . Physical activity:    Days per week: Not on file  Minutes per session: Not on file  . Stress: Not on file  Relationships  . Social connections:    Talks on phone: Not on file    Gets together: Not on file    Attends religious service: Not on file    Active member of club or organization: Not on file    Attends meetings of clubs or organizations: Not on file    Relationship status: Not on file  . Intimate partner violence:    Fear of current or ex partner: Not on file    Emotionally abused: Not on file    Physically abused: Not on file    Forced sexual activity: Not on file  Other Topics Concern  . 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   4 kids, 2 are MD's   [employer] and stays active, enjoys skiing, very active, walks at least 1/2 mile daily     PHYSICAL EXAM  GENERAL EXAM/CONSTITUTIONAL: Vitals:  Vitals:   11/08/17 0810  BP: (!) 150/74  Pulse: 63  Weight: 160 lb (72.6 kg)  Height: 5\' 5"  (1.651 m)     Body mass index is 26.63 kg/m.  Visual Acuity Screening   Right eye Left eye Both eyes  Without correction:     With correction: 20/30 20/30      Patient is in no distress; well developed, nourished and groomed; neck is supple  CARDIOVASCULAR:  Examination of carotid arteries is normal; no carotid bruits  Regular rate and rhythm, no murmurs  Examination of peripheral vascular system by observation and palpation is normal  EYES:  Ophthalmoscopic exam of optic discs and posterior segments is normal; no papilledema or hemorrhages  MUSCULOSKELETAL:  Gait, strength, tone, movements noted in Neurologic exam below  NEUROLOGIC: MENTAL STATUS:  MMSE - [place] Exam 02/21/2017 12/17/2015  Orientation to time 5 5  Orientation to Place 5 5  Registration 3 3  Attention/ Calculation 0 0  Recall 3 3  Language- name 2 objects 0 0  Language- repeat 1 1   Language- follow 3 step command 3 3  Language- read & follow direction 0 0  Write a sentence 0 0  Copy design 0 0  Total score 20 20    awake, alert, oriented to person, place and time  recent and remote memory intact  normal attention and concentration  language fluent, comprehension intact, naming intact,   fund of knowledge appropriate  CRANIAL NERVE:   2nd - no papilledema on fundoscopic exam  2nd, 3rd, 4th, 6th - pupils equal and reactive to light, visual fields full to confrontation, extraocular muscles intact, no nystagmus  5th - facial sensation symmetric  7th - facial strength symmetric  8th - hearing intact  9th - palate elevates symmetrically, uvula midline  11th - shoulder shrug symmetric  12th - tongue protrusion midline  MOTOR:   normal bulk and tone, full strength in the BUE, BLE  SENSORY:   normal and symmetric to light touch, temperature, vibration  COORDINATION:   finger-nose-finger, fine finger movements normal  REFLEXES:   deep tendon reflexes present and symmetric  GAIT/STATION:   narrow based gait; able to walk on toes, heels and tandem; romberg is negative    DIAGNOSTIC DATA (LABS, IMAGING, TESTING) - I reviewed patient records, labs, notes, testing and imaging myself where available.  Lab Results  Component Value Date  WBC 7.0 05/31/2016   HGB 14.5 05/31/2016   HCT 42.2 05/31/2016   MCV 90.5 05/31/2016   PLT 245.0 05/31/2016      Component Value Date/Time   NA 140 02/21/2017 1556   K 4.3 02/21/2017 1556   CL 104 02/21/2017 1556   CO2 28 02/21/2017 1556   GLUCOSE 72 02/21/2017 1556   BUN 19 02/21/2017 1556   CREATININE 1.00 02/21/2017 1556   CREATININE 0.82 06/30/2016   CALCIUM 9.1 02/21/2017 1556   PROT 6.5 02/21/2017 1556   ALBUMIN 4.1 02/21/2017 1556   AST 20 02/21/2017 1556   AST 16 06/30/2016   ALT 19 02/21/2017 1556   ALT 21 06/30/2016   ALKPHOS 50 02/21/2017 1556   BILITOT 0.4 02/21/2017 1556    Lab Results  Component Value Date   CHOL 155 02/21/2017   HDL 45.90 02/21/2017   LDLCALC 74 02/21/2017   LDLDIRECT 119 08/19/2010   TRIG 176.0 (H) 02/21/2017   CHOLHDL 3 02/21/2017   Lab Results  Component Value Date   HGBA1C 6.2 10/05/2017   No results found for: VITAMINB12 Lab Results  Component Value Date   TSH 1.47 05/31/2016        ASSESSMENT AND PLAN  77 y.o. year old male here with 2 episodes of unprovoked loss of consciousness versus falling and concussion.  Also with some occasional gait difficulty affecting his right leg.  We will proceed with further workup.  Ddx: fall vs loss of consciousness (syncope, seizure, metabolic, TIA)  No diagnosis found.   PLAN:  - check MRI brain and cervical spine - check EEG  Orders Placed This Encounter  Procedures  . MR BRAIN W WO CONTRAST  . MR CERVICAL SPINE W WO CONTRAST  . EEG adult   Return in about 6 months (around 05/11/2018).    Penni Bombard, MD 5/91/6384, 6:65 AM Certified in Neurology, Neurophysiology and Neuroimaging  [facility] [address] [place], [place] [address] [phone]

## 2017-11-08 NOTE — Telephone Encounter (Signed)
Medicare/BCBS supp no auth patient is scheduled for 11/14/17 at GI.

## 2017-11-14 ENCOUNTER — Ambulatory Visit
Admission: RE | Admit: 2017-11-14 | Discharge: 2017-11-14 | Disposition: A | Discharge status: Home / Self Care | Payer: Medicare Other | Source: Ambulatory Visit | Attending: Diagnostic Neuroimaging | Admitting: Diagnostic Neuroimaging

## 2017-11-14 DIAGNOSIS — W19XXXA Unspecified fall, initial encounter: Secondary | ICD-10-CM

## 2017-11-14 DIAGNOSIS — R4020 Unspecified coma: Secondary | ICD-10-CM

## 2017-11-14 MED ORDER — GADOBENATE DIMEGLUMINE 529 MG/ML IV SOLN
15.0000 mL | Freq: Once | INTRAVENOUS | Status: AC | PRN
  Administered 2017-11-14: 15 mL via INTRAVENOUS

## 2017-11-17 ENCOUNTER — Ambulatory Visit (INDEPENDENT_AMBULATORY_CARE_PROVIDER_SITE_OTHER): Payer: Medicare Other | Admitting: Diagnostic Neuroimaging

## 2017-11-17 ENCOUNTER — Encounter: Payer: Self-pay | Admitting: Family Medicine

## 2017-11-17 DIAGNOSIS — W19XXXA Unspecified fall, initial encounter: Secondary | ICD-10-CM

## 2017-11-17 DIAGNOSIS — R4020 Unspecified coma: Secondary | ICD-10-CM | POA: Diagnosis not present

## 2017-11-21 ENCOUNTER — Other Ambulatory Visit: Payer: Self-pay | Admitting: *Deleted

## 2017-11-21 MED ORDER — ACCU-CHEK AVIVA PLUS W/DEVICE KIT: PACK | 0 refills | Status: DC

## 2017-11-21 MED ORDER — ACCU-CHEK FASTCLIX LANCETS MISC: 3 refills | Status: DC

## 2017-11-21 MED ORDER — GLUCOSE BLOOD VI STRP: ORAL_STRIP | 99 refills | Status: DC

## 2017-11-24 DIAGNOSIS — L570 Actinic keratosis: Secondary | ICD-10-CM | POA: Diagnosis not present

## 2017-11-24 DIAGNOSIS — D485 Neoplasm of uncertain behavior of skin: Secondary | ICD-10-CM | POA: Diagnosis not present

## 2017-11-24 DIAGNOSIS — L219 Seborrheic dermatitis, unspecified: Secondary | ICD-10-CM | POA: Diagnosis not present

## 2017-11-25 NOTE — Procedures (Signed)
   [facility]  EEG (ELECTROENCEPHALOGRAM) REPORT   STUDY DATE: 11/17/17 PATIENT NAME: Greg Rogers. DOB: 1941/07/20 MRN: [id number]  ORDERING CLINICIAN: Andrey Spearman, MD   TECHNOLOGIST: Arelia Longest  TECHNIQUE: Electroencephalogram was recorded utilizing standard 10-20 system of lead placement and reformatted into average and bipolar montages.  RECORDING TIME: 21 minutes  ACTIVATION: hyperventilation and photic stimulation  CLINICAL INFORMATION: 77 year old male with loss of consciousness.  FINDINGS: Posterior dominant background rhythms, which attenuate with eye opening, ranging 10-11 hertz and 10-15 microvolts. No focal, lateralizing, epileptiform activity or seizures are seen. Patient recorded in the awake and drowsy state. EKG channel shows regular rhythm of 60-65 beats per minute.   IMPRESSION:   Normal EEG in the awake and drowsy states.    INTERPRETING PHYSICIAN:  Penni Bombard, MD Certified in Neurology, Neurophysiology and Neuroimaging  [facility] [address] [place], [place] [address] [phone]

## 2017-11-28 ENCOUNTER — Telehealth: Payer: Self-pay | Admitting: *Deleted

## 2017-11-28 NOTE — Telephone Encounter (Signed)
Spoke with patient and informed him his MRI cervical spine showed no new major findings when compared to previous scan. Also informed him the MRI brain had unremarkable imaging results, and his EEG was normal.  The patient is not scheduled for 6 month FU; he stated he would not like to schedule one at this time. This RN advised he monitor his symptoms and call for any questions, concerns, problems. He verbalized understanding, appreciation, agreed to plan. He asked that Dr Leta Baptist be thanked as well.

## 2017-12-05 ENCOUNTER — Other Ambulatory Visit: Payer: Self-pay | Admitting: *Deleted

## 2017-12-05 ENCOUNTER — Encounter: Payer: Self-pay | Admitting: Family Medicine

## 2017-12-05 MED ORDER — GLUCOSE BLOOD VI STRP: ORAL_STRIP | 99 refills | Status: DC

## 2017-12-05 MED ORDER — ACCU-CHEK FASTCLIX LANCETS MISC: 3 refills | Status: DC

## 2018-01-02 ENCOUNTER — Ambulatory Visit (INDEPENDENT_AMBULATORY_CARE_PROVIDER_SITE_OTHER): Payer: Medicare Other | Admitting: Family Medicine

## 2018-01-02 ENCOUNTER — Encounter: Payer: Self-pay | Admitting: Family Medicine

## 2018-01-02 VITALS — BP 142/80 | HR 75 | Temp 99.3°F | Ht 65.0 in | Wt 155.5 lb

## 2018-01-02 DIAGNOSIS — R300 Dysuria: Secondary | ICD-10-CM

## 2018-01-02 LAB — POC URINALSYSI DIPSTICK (AUTOMATED)
Bilirubin, UA: NEGATIVE
GLUCOSE UA: NEGATIVE
Ketones, UA: NEGATIVE
NITRITE UA: NEGATIVE
Protein, UA: POSITIVE — AB
Spec Grav, UA: 1.025 (ref 1.010–1.025)
UROBILINOGEN UA: 0.2 U/dL
pH, UA: 6 (ref 5.0–8.0)

## 2018-01-02 MED ORDER — SULFAMETHOXAZOLE-TRIMETHOPRIM 400-80 MG PO TABS: 1.0000 | ORAL_TABLET | Freq: Two times a day (BID) | ORAL | 0 refills | Status: DC

## 2018-01-02 NOTE — Patient Instructions (Signed)
Drink plenty of water and start the antibiotics today.  We'll contact you with your lab report.  Take care.   

## 2018-01-02 NOTE — Progress Notes (Signed)
Sx started about 2 nights ago. Lower abd discomfort. He has urgency, frequency.  All with new sx in the last 2 days.  Clearly having pain with urination.  No testicle pain.  No fevers >100.  He feels tired.  No vomiting.    Some diarrhea recently but also with some normal stools.  No blood in stool. No h/o UTI in the past but h/o renal stones years ago.  No flank pain.  He couldn't tell if AZO helped much.    Meds, vitals, and allergies reviewed.   ROS: Per HPI unless specifically indicated in ROS section   GEN: nad, alert and oriented HEENT: mucous membranes moist NECK: supple CV: rrr.  PULM: ctab, no inc wob ABD: soft, +bs, suprapubic area tender, no rebound.   EXT: no edema SKIN: no acute rash BACK: no CVA pain

## 2018-01-03 DIAGNOSIS — R300 Dysuria: Secondary | ICD-10-CM | POA: Insufficient documentation

## 2018-01-03 NOTE — Assessment & Plan Note (Signed)
Likely cystitis.  Nontoxic.  Okay for outpatient follow-up.  Start Septra.  Check urine culture.  Drink plenty fluids.  Update me as needed.  Routine cautions given.  He agrees.

## 2018-01-04 ENCOUNTER — Ambulatory Visit: Payer: Medicare Other | Admitting: Family Medicine

## 2018-01-05 ENCOUNTER — Encounter: Payer: Self-pay | Admitting: Family Medicine

## 2018-01-05 ENCOUNTER — Other Ambulatory Visit: Payer: Self-pay | Admitting: Family Medicine

## 2018-01-05 LAB — URINE CULTURE
MICRO NUMBER:: 90611776
SPECIMEN QUALITY:: ADEQUATE

## 2018-01-05 MED ORDER — AMOXICILLIN 875 MG PO TABS: 875.0000 mg | ORAL_TABLET | Freq: Two times a day (BID) | ORAL | 0 refills | Status: DC

## 2018-01-07 ENCOUNTER — Encounter: Payer: Self-pay | Admitting: Family Medicine

## 2018-01-13 ENCOUNTER — Other Ambulatory Visit: Payer: Self-pay | Admitting: Family Medicine

## 2018-01-13 MED ORDER — AMOXICILLIN 875 MG PO TABS: 875.0000 mg | ORAL_TABLET | Freq: Two times a day (BID) | ORAL | 0 refills | Status: DC

## 2018-02-20 ENCOUNTER — Other Ambulatory Visit: Payer: Self-pay | Admitting: Family Medicine

## 2018-02-21 ENCOUNTER — Other Ambulatory Visit (INDEPENDENT_AMBULATORY_CARE_PROVIDER_SITE_OTHER): Payer: Medicare Other

## 2018-02-21 ENCOUNTER — Ambulatory Visit (INDEPENDENT_AMBULATORY_CARE_PROVIDER_SITE_OTHER): Payer: Medicare Other

## 2018-02-21 DIAGNOSIS — E119 Type 2 diabetes mellitus without complications: Secondary | ICD-10-CM

## 2018-02-21 LAB — MICROALBUMIN / CREATININE URINE RATIO
CREATININE, U: 176.3 mg/dL
Microalb Creat Ratio: 0.4 mg/g (ref 0.0–30.0)
Microalb, Ur: <0.7 mg/dL (ref 0.0–1.9)

## 2018-02-21 LAB — COMPREHENSIVE METABOLIC PANEL
ALBUMIN: 4.2 g/dL (ref 3.5–5.2)
ALT: 31 U/L (ref 0–53)
AST: 23 U/L (ref 0–37)
Alkaline Phosphatase: 67 U/L (ref 39–117)
BUN: 19 mg/dL (ref 6–23)
CALCIUM: 9 mg/dL (ref 8.4–10.5)
CHLORIDE: 103 meq/L (ref 96–112)
CO2: 31 meq/L (ref 19–32)
CREATININE: 1.08 mg/dL (ref 0.40–1.50)
GFR: 70.78 mL/min (ref >60.00)
Glucose, Bld: 146 mg/dL — ABNORMAL HIGH (ref 70–99)
Potassium: 4.3 mEq/L (ref 3.5–5.1)
Sodium: 139 mEq/L (ref 135–145)
Total Bilirubin: 0.5 mg/dL (ref 0.2–1.2)
Total Protein: 6.7 g/dL (ref 6.0–8.3)

## 2018-02-21 LAB — HEMOGLOBIN A1C: Hgb A1c MFr Bld: 6.6 % — ABNORMAL HIGH (ref 4.6–6.5)

## 2018-02-21 LAB — LIPID PANEL
CHOL/HDL RATIO: 3
CHOLESTEROL: 115 mg/dL (ref 0–200)
HDL: 42.1 mg/dL (ref >39.00)
LDL CALC: 45 mg/dL (ref 0–99)
NonHDL: 72.55
TRIGLYCERIDES: 138 mg/dL (ref 0.0–149.0)
VLDL: 27.6 mg/dL (ref 0.0–40.0)

## 2018-02-21 NOTE — Progress Notes (Signed)
Due to unforeseen circumstances, patient was not seen today for AWV. AWV rescheduled to 02/28/18.

## 2018-02-22 ENCOUNTER — Ambulatory Visit: Payer: Medicare Other

## 2018-02-28 ENCOUNTER — Ambulatory Visit (INDEPENDENT_AMBULATORY_CARE_PROVIDER_SITE_OTHER): Payer: Medicare Other

## 2018-02-28 ENCOUNTER — Encounter: Payer: Self-pay | Admitting: Family Medicine

## 2018-02-28 ENCOUNTER — Ambulatory Visit (INDEPENDENT_AMBULATORY_CARE_PROVIDER_SITE_OTHER): Payer: Medicare Other | Admitting: Family Medicine

## 2018-02-28 VITALS — BP 124/80 | HR 69 | Temp 98.6°F | Ht 66.25 in | Wt 157.5 lb

## 2018-02-28 DIAGNOSIS — R454 Irritability and anger: Secondary | ICD-10-CM | POA: Diagnosis not present

## 2018-02-28 DIAGNOSIS — Z0000 Encounter for general adult medical examination without abnormal findings: Secondary | ICD-10-CM

## 2018-02-28 DIAGNOSIS — E119 Type 2 diabetes mellitus without complications: Secondary | ICD-10-CM | POA: Diagnosis not present

## 2018-02-28 DIAGNOSIS — E785 Hyperlipidemia, unspecified: Secondary | ICD-10-CM

## 2018-02-28 DIAGNOSIS — R300 Dysuria: Secondary | ICD-10-CM

## 2018-02-28 DIAGNOSIS — Z7189 Other specified counseling: Secondary | ICD-10-CM

## 2018-02-28 MED ORDER — GLIMEPIRIDE 2 MG PO TABS
ORAL_TABLET | ORAL | 3 refills | Status: DC
Start: 2018-02-28 — End: 2018-06-14

## 2018-02-28 MED ORDER — SIMVASTATIN 10 MG PO TABS: 10.0000 mg | ORAL_TABLET | Freq: Every day | ORAL | 3 refills | Status: DC

## 2018-02-28 NOTE — Progress Notes (Signed)
We talked about prev UCx results and abx use based on ucx and potentially trying to limit possible ADE related to cipro or similar (ie tendon pathology). All urinary sx resolved.    He has had mult tick bites, all removed, with no suggestive sx in the meantime.  No fevers, no rash.  Routine cautions d/w pt.   He'll update me as needed.    Diabetes:  Using medications without difficulties: yes Hypoglycemic episodes:no Hyperglycemic episodes:no Feet problems:no Blood Sugars averaging: ~120 eye exam within last year: yes  Mood d/w pt.  Still on SSRI.  No adverse effect on med.  It helps.  He wanted to continue.  Elevated Cholesterol: Using medications without problems: yes Muscle aches: not usually but some cramping after a day with heavy activity.   Diet compliance: yes Exercise:yes  Declined hearing aids.  D/w pt.   Advance directive-daughter Angelique Blonder designated if patient were incapacitated.  shingrix d/w pt.  See AVS.   Prostate cancer screening and PSA options (with potential risks and benefits of testing vs not testing) were discussed along with recent recs/guidelines.  He declined testing PSA at this point. Colonoscopy 2018 Memory d/w pt.  Normal testing today.  Still working and managing his finances.  He is functional but he has some slowing of recall.  We discussed, it makes sense to follow clinically for now.  He agrees.    PMH and SH reviewed  Meds, vitals, and allergies reviewed.   ROS: Per HPI unless specifically indicated in ROS section   GEN: nad, alert and oriented HEENT: mucous membranes moist NECK: supple w/o LA CV: rrr. 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   No skin breakdown B calluses on the 1st toes and wart in the midst of treatment on the L foot, plantar side.   Normal DP pulses Normal sensation to light touch and monofilament Nails  normal

## 2018-02-28 NOTE — Progress Notes (Signed)
PCP notes:   Health maintenance:  No gaps identified.  Abnormal screenings:   Hearing - failed  Hearing Screening   125Hz  250Hz  500Hz  1000Hz  2000Hz  3000Hz  4000Hz  6000Hz  8000Hz   Right ear:   40 40 40  40    Left ear:   40 40 40  0     Patient concerns:   None  Nurse concerns:  None  Next PCP appt:   02/28/18 @ 1215  I reviewed health advisor's note, was available for consultation on the day of service listed in this note, and agree with documentation and plan. Elsie Stain, MD.

## 2018-02-28 NOTE — Patient Instructions (Signed)
Marland Kitchen  Greg Rogers , Thank you for taking time to come for your Medicare Wellness Visit. I appreciate your ongoing commitment to your health goals. Please review the following plan we discussed and let me know if I can assist you in the future.   These are the goals we discussed: Goals    . Patient Stated     Starting 07/01/2018, I will continue to take medications as prescribed.        This is a list of the screening recommended for you and due dates:  Health Maintenance  Topic Date Due  . Flu Shot  03/16/2018  . Eye exam for diabetics  08/04/2018  . Hemoglobin A1C  08/24/2018  . Complete foot exam   11/02/2018  . Urine Protein Check  02/22/2019  . Tetanus Vaccine  09/26/2022  . Colon Cancer Screening  12/28/2026    Preventive Care for Adults  A healthy lifestyle and preventive care can promote health and wellness. Preventive health guidelines for adults include the following key practices.  . A routine yearly physical is a good way to check with your health care provider about your health and preventive screening. It is a chance to share any concerns and updates on your health and to receive a thorough exam.  . Visit your dentist for a routine exam and preventive care every 6 months. Brush your teeth twice a day and floss once a day. Good oral hygiene prevents tooth decay and gum disease.  . The frequency of eye exams is based on your age, health, family medical history, use  of contact lenses, and other factors. Follow your health care provider's recommendations for frequency of eye exams.  . Eat a healthy diet. Foods like vegetables, fruits, whole grains, low-fat dairy products, and lean protein foods contain the nutrients you need without too many calories. Decrease your intake of foods high in solid fats, added sugars, and salt. Eat the right amount of calories for you. Get information about a proper diet from your health care provider, if necessary.  . Regular physical exercise  is one of the most important things you can do for your health. Most adults should get at least 150 minutes of moderate-intensity exercise (any activity that increases your heart rate and causes you to sweat) each week. In addition, most adults need muscle-strengthening exercises on 2 or more days a week.  Silver Sneakers may be a benefit available to you. To determine eligibility, you may visit the website: www.silversneakers.com or contact program at 403-229-6497 Mon-Fri between 8AM-8PM.   . Maintain a healthy weight. The body mass index (BMI) is a screening tool to identify possible weight problems. It provides an estimate of body fat based on height and weight. Your health care provider can find your BMI and can help you achieve or maintain a healthy weight.   For adults 20 years and older: ? A BMI below 18.5 is considered underweight. ? A BMI of 18.5 to 24.9 is normal. ? A BMI of 25 to 29.9 is considered overweight. ? A BMI of 30 and above is considered obese.   . Maintain normal blood lipids and cholesterol levels by exercising and minimizing your intake of saturated fat. Eat a balanced diet with plenty of fruit and vegetables. Blood tests for lipids and cholesterol should begin at age 50 and be repeated every 5 years. If your lipid or cholesterol levels are high, you are over 50, or you are at high risk for heart disease,  you may need your cholesterol levels checked more frequently. Ongoing high lipid and cholesterol levels should be treated with medicines if diet and exercise are not working.  . If you smoke, find out from your health care provider how to quit. If you do not use tobacco, please do not start.  . If you choose to drink alcohol, please do not consume more than 2 drinks per day. One drink is considered to be 12 ounces (355 mL) of beer, 5 ounces (148 mL) of wine, or 1.5 ounces (44 mL) of liquor.  . If you are 79-31 years old, ask your health care provider if you should take  aspirin to prevent strokes.  . Use sunscreen. Apply sunscreen liberally and repeatedly throughout the day. You should seek shade when your shadow is shorter than you. Protect yourself by wearing long sleeves, pants, a wide-brimmed hat, and sunglasses year round, whenever you are outdoors.  . Once a month, do a whole body skin exam, using a mirror to look at the skin on your back. Tell your health care provider of new moles, moles that have irregular borders, moles that are larger than a pencil eraser, or moles that have changed in shape or color.

## 2018-02-28 NOTE — Progress Notes (Signed)
Subjective:   Greg Conwell. is a 77 y.o. male who presents for Medicare Annual/Subsequent preventive examination.  Review of Systems:  N/A Cardiac Risk Factors include: advanced age (>77mn, >>60women);diabetes mellitus;dyslipidemia;male gender;hypertension     Objective:    Vitals: BP 124/80 (BP Location: Right Arm, Patient Position: Sitting, Cuff Size: Normal)   Pulse 69   Temp 98.6 F (37 C) (Oral)   Ht 5' 6.25" (1.683 m) Comment: shoes  Wt 157 lb 8 oz (71.4 kg)   SpO2 98%   BMI 25.23 kg/m   Body mass index is 25.23 kg/m.  Advanced Directives 02/28/2018 02/21/2017 12/17/2015  Does Patient Have a Medical Advance Directive? Yes Yes Yes  Type of AParamedicof ABrookingsLiving will [place]Living will [place] LawnLiving will  Does patient want to make changes to medical advance directive? - - No - Patient declined  Copy of [place] Chart? Yes Yes No - copy requested    Tobacco Social History   Tobacco Use  Smoking Status Never Smoker  Smokeless Tobacco Former USystems developer . Types: Chew     Counseling given: No   Clinical Intake:  Pre-visit preparation completed: Yes  Pain : No/denies pain Pain Score: 0-No pain     Nutritional Status: BMI 25 -29 Overweight Nutritional Risks: None Diabetes: No  How often do you need to have someone help you when you read instructions, pamphlets, or other written materials from your doctor or pharmacy?: 1 - Never  Interpreter Needed?: No  Comments: pt lives with spouse Information entered by :: LPinson, LPN  Past Medical History:  Diagnosis Date  . Abnormal GGT test    With otherwise normal work up.  . Arthritis   . Diabetes mellitus without complication ([place]   . Foot pain    Bilateral  . Hyperglycemia   . Hyperlipidemia   . Nephrolithiasis   . 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 Persistent disorder of initiating or  maintaining sleep   . Pes planus   . Rosacea   . Snoring    Past Surgical History:  Procedure Laterality Date  . APPENDECTOMY  1949   at age 77 . CERVICAL FUSION  03/2016   C 3&4  . Cyst removed from breast  1960  . FOOT SURGERY  01/2009   Left  . HERNIA REPAIR  1956   at age 77(single)  . HERNIA REPAIR  1960   (double)  . TONSILLECTOMY AND ADENOIDECTOMY  ~ 1950  . VASECTOMY  ~ 154  Family History  Problem Relation Age of Onset  . Heart disease Father 529      First MI  . Hyperlipidemia Father   . Heart attack Father   . Diabetes Brother   . Hyperlipidemia Brother   . Hypertension Brother   . Heart disease Brother        CAD s/p stent  . CAD Brother   . Heart disease Brother   . Heart disease Brother   . Diabetes Sister        DM2  . Hyperlipidemia Sister   . Arthritis Other   . Diabetes Other   . Prostate cancer Neg Hx   . Colon cancer Neg Hx    Social History   Socioeconomic History  . Marital status: Married    Spouse name: Not on file  . Number of children: 4  .  Years of education: Not on file  . Highest education level: Not on file  Occupational History  . Occupation: Retired Nov. 2007 27-1/2 years of service    Employer: RETIRED    Comment: Advice worker  . Occupation: Works part time at [employer] job.  Social Needs  . Financial resource strain: Not on file  . Food insecurity:    Worry: Not on file    Inability: Not on file  . Transportation needs:    Medical: Not on file    Non-medical: Not on file  Tobacco Use  . Smoking status: Never Smoker  . Smokeless tobacco: Former Systems developer    Types: Chew  Substance and Sexual Activity  . Alcohol use: Yes    Alcohol/week: 7.8 - 12.0 oz    Types: 7 - 14 Glasses of wine, 6 Standard drinks or equivalent per week    Comment: 1-2 glasses of wine at night  . Drug use: No  . Sexual activity: Never  Lifestyle  . Physical activity:    Days per week: Not on file    Minutes per  session: Not on file  . Stress: Not on file  Relationships  . Social connections:    Talks on phone: Not on file    Gets together: Not on file    Attends religious service: Not on file    Active member of club or organization: Not on file    Attends meetings of clubs or organizations: Not on file    Relationship status: Not on file  Other Topics Concern  . 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   4 kids, 2 are MD's   [facility] and stays active, enjoys skiing, very active, walks at least 1/2 mile daily    Outpatient Encounter Medications as of 02/28/2018  Medication Sig  . ACCU-CHEK FASTCLIX LANCETS MISC USE AS INSTRUCTED TO CHECK  BLOOD SUGAR ONCE DAILY  . aspirin 81 MG EC tablet Take 81 mg by mouth daily.   . Blood Glucose Monitoring Suppl (ACCU-CHEK AVIVA PLUS) w/Device KIT Use as instructed to check blood sugar once daily.  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  Marland Kitchen EPINEPHrine 0.3 mg/0.3 mL IJ SOAJ injection Inject 0.3 mLs (0.3 mg total) into the muscle once. (Patient taking differently: Inject 0.3 mg into the muscle as needed. )  . glimepiride (AMARYL) 2 MG tablet TAKE 1 TABLET BY MOUTH  DAILY BEFORE BREAKFAST  . glucose blood (ACCU-CHEK AVIVA PLUS) test strip Use to check blood sugar once daily.  Diagnosis:  E11.9   Non-insulin dependent.  . metFORMIN (GLUCOPHAGE) 500 MG tablet TAKE 2 TABLETS BY MOUTH  TWICE A DAY WITH A MEAL  . Multiple Vitamin (MULTIVITAMIN) tablet Take 1 tablet by mouth daily.   . nitroGLYCERIN (NITROSTAT) 0.4 MG SL tablet Place 1 tablet (0.4 mg total) under the tongue every 5 (five) minutes as needed for chest pain (max 3 doses/15 min.call MD/to ER if used).  . Salicylic Acid (COMPOUND W) 40 % PADS Apply topically every other day.  . sertraline (ZOLOFT) 50 MG tablet TAKE 1 TABLET BY MOUTH  DAILY  . simvastatin (ZOCOR) 10 MG tablet TAKE 1 TABLET BY MOUTH AT  BEDTIME  .  triamcinolone cream (KENALOG) 0.1 % Apply 1 application topically 2 (two) times daily as needed.  Marland Kitchen omeprazole (PRILOSEC) 20 MG capsule Take 20  mg by mouth daily as needed. As needed   No facility-administered encounter medications on file as of 02/28/2018.     Activities of Daily Living In your present state of health, do you have any difficulty performing the following activities: 02/28/2018  Hearing? N  Vision? N  Difficulty concentrating or making decisions? Y  Walking or climbing stairs? N  Dressing or bathing? N  Doing errands, shopping? N  Preparing Food and eating ? N  Using the Toilet? N  In the past six months, have you accidently leaked urine? N  Do you have problems with loss of bowel control? N  Managing your Medications? N  Managing your Finances? N  Housekeeping or managing your Housekeeping? N  Some recent data might be hidden    Patient Care Team: Tonia Ghent, MD as PCP - Silvano Rusk, MD as Consulting Physician (Ophthalmology) Nicoletta Ba as Consulting Physician (Dentistry)   Assessment:   This is a routine wellness examination for Greg Rogers.   Hearing Screening   125Hz 250Hz 500Hz 1000Hz 2000Hz 3000Hz 4000Hz 6000Hz 8000Hz  Right ear:   40 40 40  40    Left ear:   40 40 40  0    Vision Screening Comments: Vision exam in 2018 with  Dr. Claudean Kinds   Exercise Activities and Dietary recommendations Current Exercise Habits: Home exercise routine, Type of exercise: walking, Time (Minutes): 30, Frequency (Times/Week): 7, Weekly Exercise (Minutes/Week): 210, Intensity: Mild, Exercise limited by: None identified  Goals    . Patient Stated     Starting 07/01/2018, I will continue to take medications as prescribed.        Fall Risk Fall Risk  02/28/2018 03/04/2017 02/21/2017 12/17/2015 11/25/2014  Falls in the past year? No No Yes No No  Comment - Emmi Telephone Survey: data to providers prior to load - - -  Number falls in past yr: - - 1 - -    Injury with Fall? - - No - -   Depression Screen PHQ 2/9 Scores 02/28/2018 02/21/2017 12/17/2015 11/25/2014  PHQ - 2 Score 0 0 0 0  PHQ- 9 Score 0 - - -    Cognitive Function MMSE - Mini Mental State Exam 02/28/2018 02/21/2017 12/17/2015  Orientation to time _0 Orientation to Place _1 Registration _2 Attention/ Calculation 0 0 0  Recall _3 Language- name 2 objects 0 0 0  Language- repeat _4 Language- follow 3 step command _5 Language- read & follow direction 0 0 0  Write a sentence 0 0 0  Copy design 0 0 0  Total score _6 MMSE - Mini Mental State Exam 02/28/2018 02/21/2017 12/17/2015  Orientation to time _7 Orientation to Place _8 Registration _9 Attention/ Calculation 0 0 0  Recall _10 Language- name 2 objects 0 0 0  Language- repeat _11 Language- follow 3 step command _12 Language- read & follow direction 0 0 0  Write a sentence 0 0 0  Copy design 0 0 0  Total score _13 Immunization History  Administered Date(s) Administered  . Influenza Whole 05/21/2013  . Influenza,inj,Quad PF,6+ Mos 05/06/2017  . Influenza-Unspecified 05/29/2014, 05/21/2015, 04/12/2016  . Pneumococcal Polysaccharide-23 08/17/2003  . Td 08/16/2004, 09/26/2012  Screening Tests Health Maintenance  Topic Date Due  . INFLUENZA VACCINE  03/16/2018  . OPHTHALMOLOGY EXAM  08/04/2018  . HEMOGLOBIN A1C  08/24/2018  . FOOT EXAM  11/02/2018  . URINE MICROALBUMIN  02/22/2019  . TETANUS/TDAP  09/26/2022  . COLONOSCOPY  12/28/2026        Plan:   I have personally reviewed, addressed, and noted the following in the patient's chart:  A. Medical and social history B. Use of alcohol, tobacco or illicit drugs  C. Current medications and supplements D. Functional ability and status E.  Nutritional status F.  Physical activity G. Advance directives H. List of other physicians I.  Hospitalizations, surgeries, and ER visits in previous 12  months J.  [place] to include hearing, vision, cognitive, depression L. Referrals and appointments - none  In addition, I have reviewed and discussed with patient certain preventive protocols, quality metrics, and best practice recommendations. A written personalized care plan for preventive services as well as general preventive health recommendations were provided to patient.  See attached scanned questionnaire for additional information.   Signed,   Lindell Noe, MHA, BS, LPN Health Coach

## 2018-02-28 NOTE — Patient Instructions (Addendum)
If you have any low sugars, then stop the glimepiride and update me.   Recheck A1c in 5-6 months, lab visit.   Assuming your labs are fine and your sugar is stable and you feel well, recheck in a year.  Update me as needed.  Take care.  Glad to see you.   I would get a flu shot each fall.

## 2018-03-01 NOTE — Assessment & Plan Note (Signed)
Controlled.  Labs discussed with patient.  Continue work on diet and exercise.  Microalbumin negative.  Recheck A1c in about 5-6 months.  See after visit summary.  If he is doing well and his sugar is stable then we can recheck here in a year.  He agrees. >25 minutes spent in face to face time with patient, >50% spent in counselling or coordination of care.

## 2018-03-01 NOTE — Assessment & Plan Note (Signed)
Advance directive-daughter Julie Verchick designated if patient were incapacitated.  ?

## 2018-03-01 NOTE — Assessment & Plan Note (Signed)
We talked about prev UCx results and abx use based on ucx and potentially trying to limit possible ADE related to cipro or similar (ie tendon pathology). All urinary sx resolved.

## 2018-03-01 NOTE — Assessment & Plan Note (Signed)
Declined hearing aids.  D/w pt.   Advance directive-daughter Angelique Blonder designated if patient were incapacitated.  shingrix d/w pt.  See AVS.   Prostate cancer screening and PSA options (with potential risks and benefits of testing vs not testing) were discussed along with recent recs/guidelines.  He declined testing PSA at this point. Colonoscopy 2018 Memory d/w pt.  Normal testing today.  Still working and managing his finances.  He is functional but he has some slowing of recall.  We discussed, it makes sense to follow clinically for now.  He agrees.

## 2018-03-01 NOTE — Assessment & Plan Note (Signed)
Controlled.  Labs discussed with patient.  Continue work on diet and exercise.  No change in meds.  Update me as needed.  He agrees.

## 2018-03-01 NOTE — Assessment & Plan Note (Signed)
Controlled with SSRI.  Reasonable to continue.  Update me as needed.  He agrees.

## 2018-03-02 ENCOUNTER — Ambulatory Visit: Payer: Medicare Other

## 2018-03-03 ENCOUNTER — Other Ambulatory Visit: Payer: Self-pay | Admitting: Family Medicine

## 2018-05-08 ENCOUNTER — Other Ambulatory Visit: Payer: Self-pay | Admitting: Family Medicine

## 2018-05-12 ENCOUNTER — Telehealth: Payer: Medicare Other | Admitting: Family

## 2018-05-12 DIAGNOSIS — R21 Rash and other nonspecific skin eruption: Secondary | ICD-10-CM

## 2018-05-12 MED ORDER — PREDNISONE 5 MG PO TABS: 5.0000 mg | ORAL_TABLET | ORAL | 0 refills | Status: DC

## 2018-05-12 NOTE — Progress Notes (Signed)
Thank you for the details you included in the comment boxes. Those details are very helpful in determining the best course of treatment for you and help [place] to provide the best care. Per our detailed telephone call, this is likely contact dermatitis rather than cellulitis. However, as we discussed, please reach out to [place] if this is not improving in the next 24h so we can decide if antibiotics are necessary and this was indeed the early stages of cellulitis which was not yet prominent.   E Visit for Rash  We are sorry that you are not feeling well. Here is how we plan to help!  Prednisone 5 mg daily for 6 days (see taper instructions below)  Directions for 6 day taper: Day 1: 2 tablets before breakfast, 1 after both lunch & dinner and 2 at bedtime Day 2: 1 tab before breakfast, 1 after both lunch & dinner and 2 at bedtime Day 3: 1 tab at each meal & 1 at bedtime Day 4: 1 tab at breakfast, 1 at lunch, 1 at bedtime Day 5: 1 tab at breakfast & 1 tab at bedtime Day 6: 1 tab at breakfast    HOME CARE:   Take cool showers and avoid direct sunlight.  Apply cool compress or wet dressings.  Take a bath in an oatmeal bath.  Sprinkle content of one Aveeno packet under running faucet with comfortably warm water.  Bathe for 15-20 minutes, 1-2 times daily.  Pat dry with a towel. Do not rub the rash.  Use hydrocortisone cream.  Take an antihistamine like Benadryl for widespread rashes that itch.  The adult dose of Benadryl is 25-50 mg by mouth 4 times daily.  Caution:  This type of medication may cause sleepiness.  Do not drink alcohol, drive, or operate dangerous machinery while taking antihistamines.  Do not take these medications if you have prostate enlargement.  Read package instructions thoroughly on all medications that you take.  GET HELP RIGHT AWAY IF:   Symptoms don't go away after treatment.  Severe itching that persists.  If you rash spreads or swells.  If you rash begins to  smell.  If it blisters and opens or develops a yellow-brown crust.  You develop a fever.  You have a sore throat.  You become short of breath.  MAKE SURE YOU:  Understand these instructions. Will watch your condition. Will get help right away if you are not doing well or get worse.  Thank you for choosing an e-visit. Your e-visit answers were reviewed by a board certified advanced clinical practitioner to complete your personal care plan. Depending upon the condition, your plan could have included both over the counter or prescription medications. Please review your pharmacy choice. Be sure that the pharmacy you have chosen is open so that you can pick up your prescription now.  If there is a problem you may message your provider in [place] to have the prescription routed to another pharmacy. Your safety is important to [place]. If you have drug allergies check your prescription carefully.  For the next 24 hours, you can use MyChart to ask questions about today's visit, request a non-urgent call back, or ask for a work or school excuse from your e-visit provider. You will get an email in the next two days asking about your experience. I hope that your e-visit has been valuable and will speed your recovery.

## 2018-05-21 IMAGING — DX DG LUMBAR SPINE COMPLETE 4+V
5 series · 5 of 5 positions shown · non-contrast
Comparison: 12/22/2015

CLINICAL DATA: Low back pain with right-sided radiculopathy

EXAM:
LUMBAR SPINE - COMPLETE 4+ VIEW

[l-spine ap]
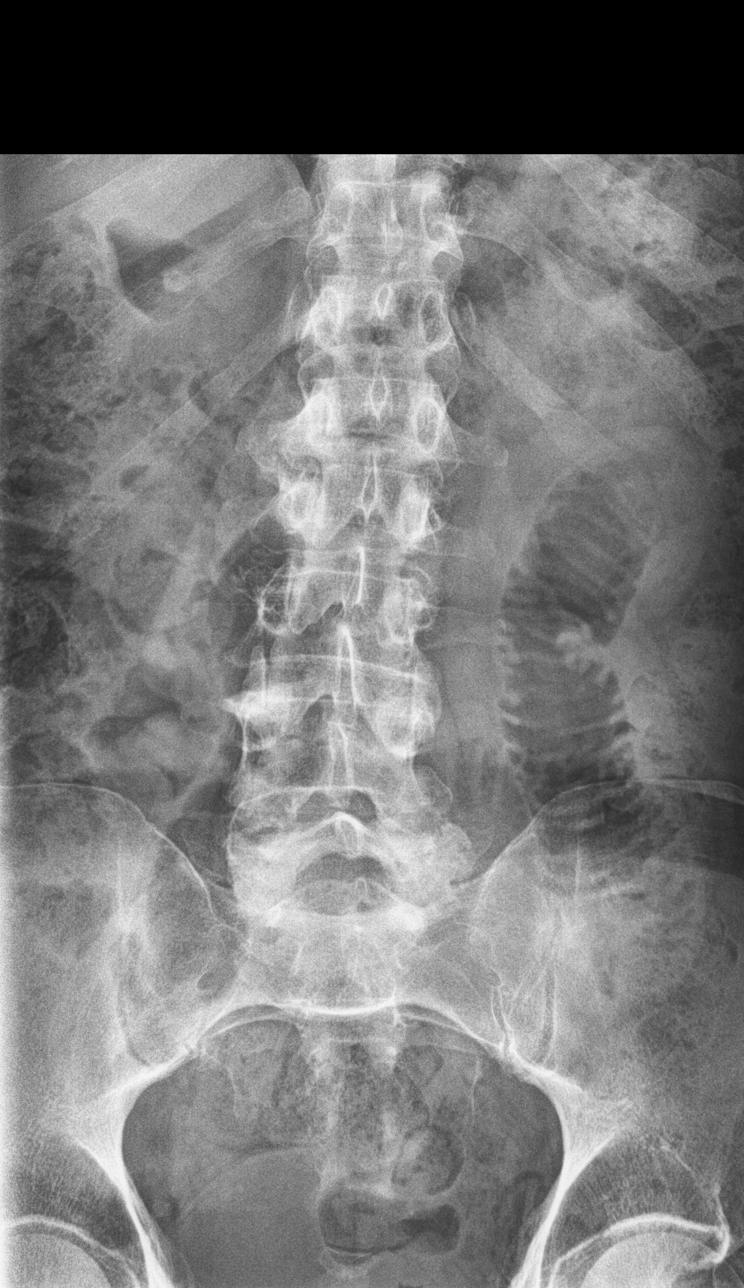

[l-spine obl (1 of 2)]
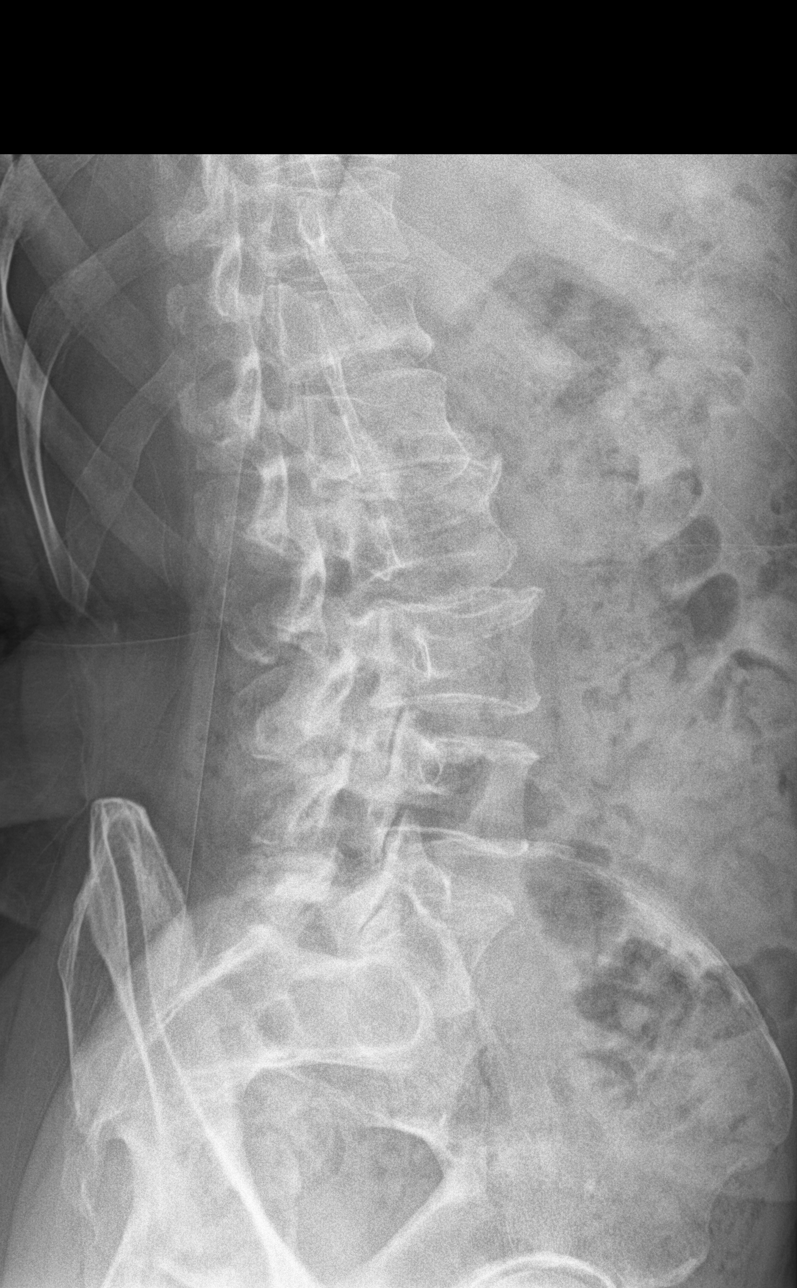

[l-spine obl (2 of 2)]
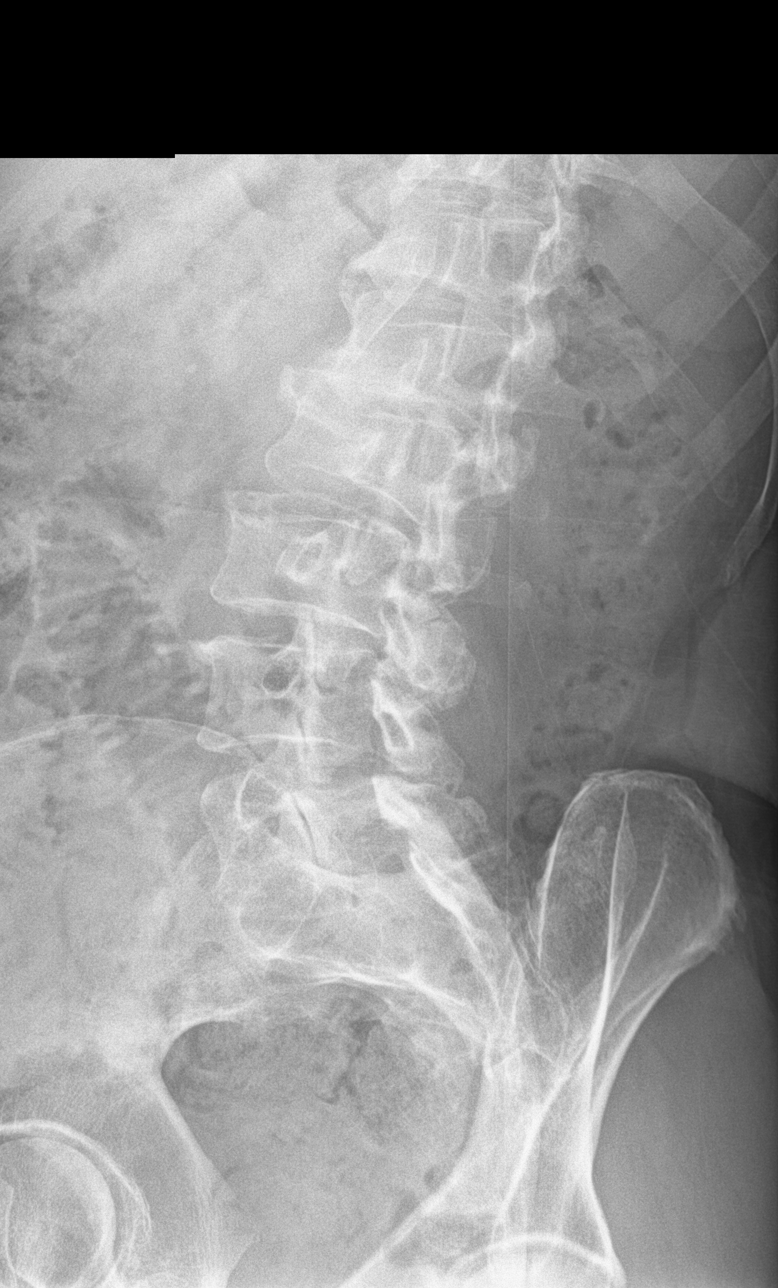

[l-spine lat]
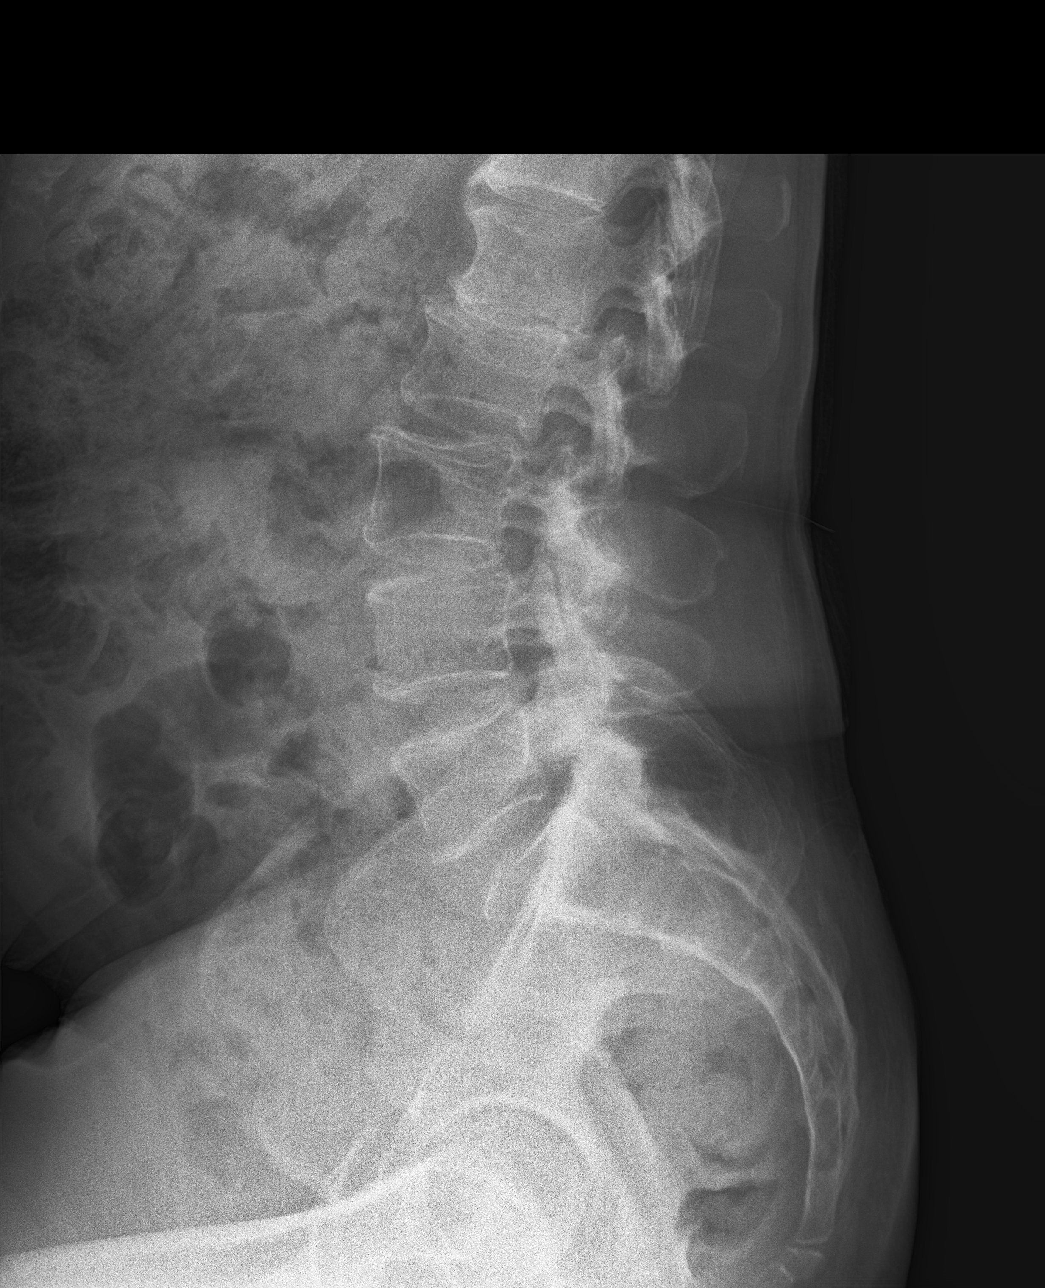

[l-spine l5/s1]
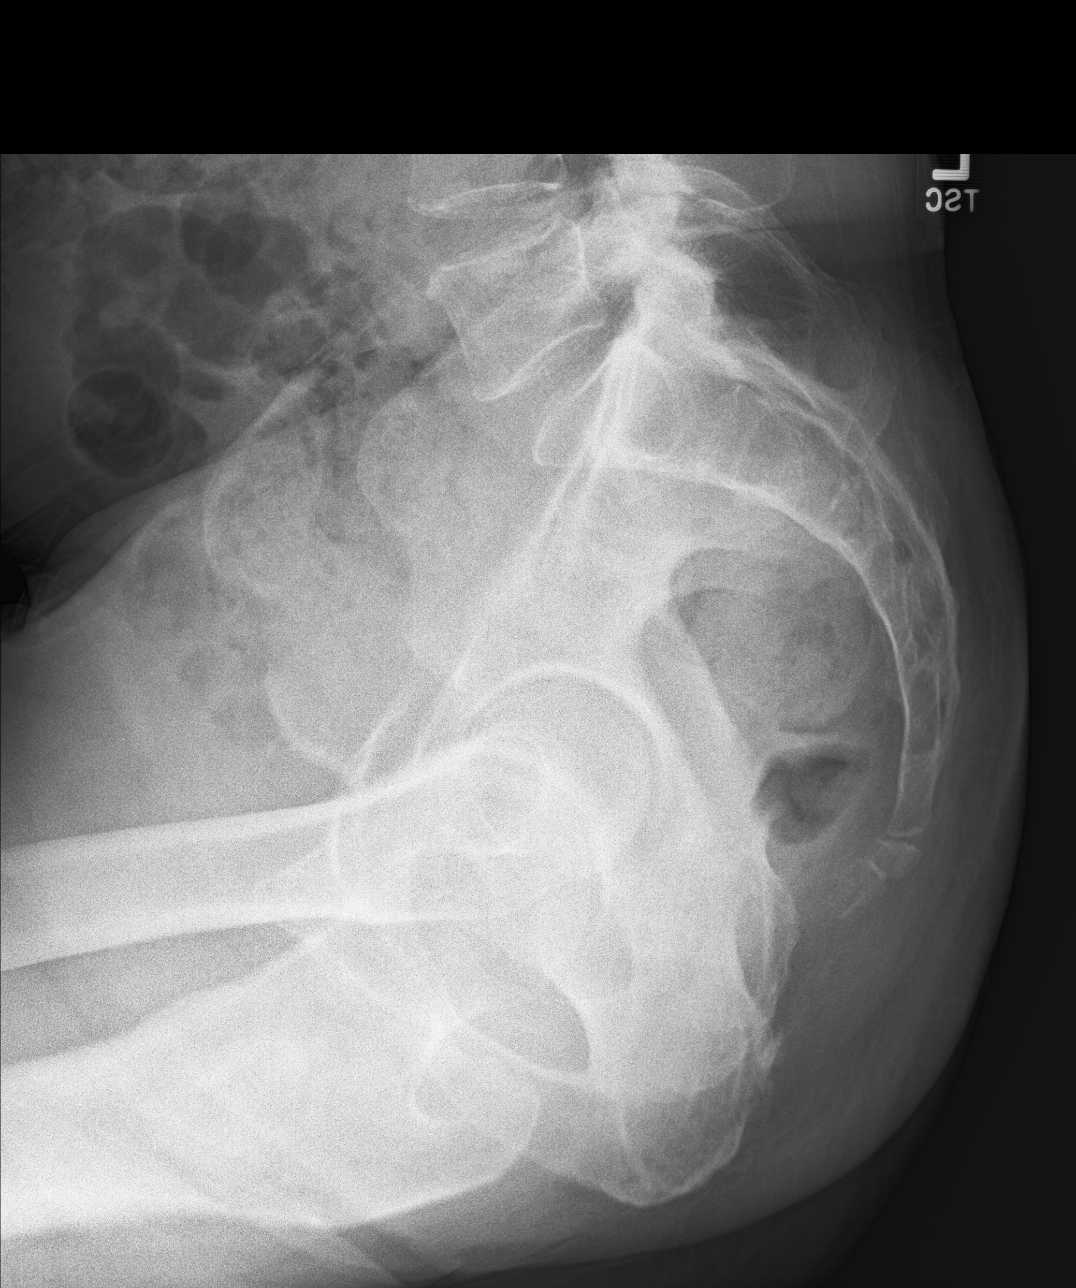

[5 of 5 positions shown; findings below may reference images not displayed]

FINDINGS: Five lumbar type vertebral bodies are well visualized. Vertebral
body height show some further progression of mild compression at L2
and L3. These are chronic in nature. No new compression deformity is
seen. Degenerative changes are noted with spurring at T12-L1 and
L1-L2. Facet hypertrophic changes are noted. No anterolisthesis is
seen. No soft tissue abnormality is noted.
IMPRESSION: Chronic but progressive compression deformity at L2 and L3 when
compared with the prior exam.

## 2018-05-25 ENCOUNTER — Encounter: Payer: Self-pay | Admitting: Family Medicine

## 2018-05-25 DIAGNOSIS — Z23 Encounter for immunization: Secondary | ICD-10-CM | POA: Diagnosis not present

## 2018-05-26 ENCOUNTER — Encounter: Payer: Self-pay | Admitting: *Deleted

## 2018-05-26 ENCOUNTER — Encounter: Payer: Self-pay | Admitting: Family Medicine

## 2018-06-13 ENCOUNTER — Encounter: Payer: Self-pay | Admitting: Family Medicine

## 2018-06-14 MED ORDER — GLIMEPIRIDE 2 MG PO TABS: ORAL_TABLET | ORAL | 0 refills | Status: DC

## 2018-06-26 ENCOUNTER — Other Ambulatory Visit: Payer: Self-pay | Admitting: Family Medicine

## 2018-07-21 ENCOUNTER — Encounter: Payer: Self-pay | Admitting: Family Medicine

## 2018-07-25 ENCOUNTER — Other Ambulatory Visit (INDEPENDENT_AMBULATORY_CARE_PROVIDER_SITE_OTHER): Payer: Medicare Other

## 2018-07-25 DIAGNOSIS — E119 Type 2 diabetes mellitus without complications: Secondary | ICD-10-CM | POA: Diagnosis not present

## 2018-07-25 LAB — HEMOGLOBIN A1C: Hgb A1c MFr Bld: 6.6 % — ABNORMAL HIGH (ref 4.6–6.5)

## 2018-08-08 ENCOUNTER — Other Ambulatory Visit: Payer: Self-pay | Admitting: Family Medicine

## 2018-08-08 ENCOUNTER — Encounter: Payer: Self-pay | Admitting: Family Medicine

## 2018-08-08 MED ORDER — GLIMEPIRIDE 2 MG PO TABS: ORAL_TABLET | ORAL | 11 refills | Status: DC

## 2018-08-09 ENCOUNTER — Other Ambulatory Visit: Payer: Self-pay | Admitting: Family Medicine

## 2018-08-10 ENCOUNTER — Encounter: Payer: Self-pay | Admitting: Family Medicine

## 2018-08-10 ENCOUNTER — Other Ambulatory Visit: Payer: Self-pay | Admitting: *Deleted

## 2018-08-10 DIAGNOSIS — H524 Presbyopia: Secondary | ICD-10-CM | POA: Diagnosis not present

## 2018-08-10 DIAGNOSIS — E119 Type 2 diabetes mellitus without complications: Secondary | ICD-10-CM | POA: Diagnosis not present

## 2018-08-10 DIAGNOSIS — H5213 Myopia, bilateral: Secondary | ICD-10-CM | POA: Diagnosis not present

## 2018-08-10 DIAGNOSIS — H2513 Age-related nuclear cataract, bilateral: Secondary | ICD-10-CM | POA: Diagnosis not present

## 2018-08-10 LAB — HM DIABETES EYE EXAM

## 2018-08-10 MED ORDER — SIMVASTATIN 10 MG PO TABS: 10.0000 mg | ORAL_TABLET | Freq: Every day | ORAL | 3 refills | Status: DC

## 2018-08-28 ENCOUNTER — Ambulatory Visit (INDEPENDENT_AMBULATORY_CARE_PROVIDER_SITE_OTHER): Payer: Medicare Other | Admitting: Family Medicine

## 2018-08-28 ENCOUNTER — Telehealth: Payer: Self-pay

## 2018-08-28 ENCOUNTER — Encounter: Payer: Self-pay | Admitting: Family Medicine

## 2018-08-28 ENCOUNTER — Ambulatory Visit
Admission: RE | Admit: 2018-08-28 | Discharge: 2018-08-28 | Disposition: A | Discharge status: Home / Self Care | Payer: Medicare Other | Source: Ambulatory Visit | Attending: Family Medicine | Admitting: Family Medicine

## 2018-08-28 VITALS — BP 120/68 | HR 67 | Temp 97.7°F | Ht 66.25 in | Wt 164.0 lb

## 2018-08-28 DIAGNOSIS — M7989 Other specified soft tissue disorders: Secondary | ICD-10-CM

## 2018-08-28 NOTE — Patient Instructions (Signed)
Go see Rosaria Ferries on the way out.  Take care.  Glad to see you.

## 2018-08-28 NOTE — Telephone Encounter (Signed)
Greg Rogers with [facility] called report for [place] rt lower leg which was negative for DVT. Pt is waiting.Dr Damita Dunnings notified and said to tell pt no DVT; Dr Damita Dunnings thinks this is related to his injury. Pt should prop up legs when sitting or laying. Pt can get OTC compression stocking if pt wants to but not necessary. Dr Damita Dunnings said to give it time and pt is to call back with any concerns or if pt does not improve. Pt voiced understanding and is appreciative. FYI to Dr Damita Dunnings.

## 2018-08-28 NOTE — Progress Notes (Signed)
Fell while cutting wood about 8 days ago.  Hit medial proximal R calf, not the R knee.  Puffy in the meantime locally, but that is getting better.  He noted R>L lower leg swelling in the day or so.    No FCNAVD.  Feels well o/w.  No CP, SOB.  Walking well.    He has a helmet to use with skiing next month.  Cautions d/w pt.   He has rare lower sugars noted at home, cautions d/w pt.    He had eye exam done w/o retinopathy 08/10/18.   Meds, vitals, and allergies reviewed.   ROS: Per HPI unless specifically indicated in ROS section   nad ncat rrr ctab abd normal BS R knee with normal ROM but medial proximal R shin with slightly tender mass at site where he bumped leg.  No redness or bruising.   B 37cm calf but with R>L ankle edema.  Intact DP pulses B

## 2018-08-29 ENCOUNTER — Encounter: Payer: Self-pay | Admitting: Family Medicine

## 2018-08-29 DIAGNOSIS — M7989 Other specified soft tissue disorders: Secondary | ICD-10-CM | POA: Insufficient documentation

## 2018-08-29 NOTE — Assessment & Plan Note (Signed)
He could have a deep tissue bruise with dependent edema more recently.  D/w pt.   Negative for deep venous thrombosis in right lower extremity. Pt aware.  Would elevate, use compression stocking if needed, update [place] as needed.  Okay for outpatient f/u.

## 2018-08-29 NOTE — Telephone Encounter (Signed)
Agreed.  Thanks.  

## 2018-09-08 ENCOUNTER — Encounter: Payer: Self-pay | Admitting: Family Medicine

## 2018-09-24 ENCOUNTER — Other Ambulatory Visit: Payer: Self-pay | Admitting: Family Medicine

## 2018-10-08 ENCOUNTER — Encounter: Payer: Self-pay | Admitting: Family Medicine

## 2018-10-10 ENCOUNTER — Encounter: Payer: Self-pay | Admitting: Family Medicine

## 2018-10-10 ENCOUNTER — Ambulatory Visit (INDEPENDENT_AMBULATORY_CARE_PROVIDER_SITE_OTHER): Payer: Medicare Other | Admitting: Family Medicine

## 2018-10-10 ENCOUNTER — Telehealth: Payer: Self-pay | Admitting: Family Medicine

## 2018-10-10 VITALS — BP 138/74 | HR 71 | Temp 98.2°F | Resp 16 | Ht 66.25 in | Wt 157.5 lb

## 2018-10-10 DIAGNOSIS — J069 Acute upper respiratory infection, unspecified: Secondary | ICD-10-CM | POA: Diagnosis not present

## 2018-10-10 NOTE — Telephone Encounter (Addendum)
I spoke with pt; pt has head congestion and prod cough with green phlegm.pt had temp yesterday and has not taken temp today. Pt request afternoon appt.pt has been taking left over Amoxicillin and pt does still better but symptoms are not cleared. Pt scheduled with Dr Einar Pheasant 10/10/18 at 2 PM. FYI to Dr Damita Dunnings and Dr Einar Pheasant.

## 2018-10-10 NOTE — Progress Notes (Signed)
Subjective:     Greg Hays. is a 78 y.o. male presenting for Sinus Problem (started with sore throat on 2/21. Had head pressure, sinus pressure, nasal congestion, unsteady gate. Low grade fever of 99.2 with some fatigue)     Sinus Problem  This is a new problem. The current episode started in the past 7 days. The problem has been gradually improving since onset. There has been no fever. Associated symptoms include congestion, coughing, sinus pressure and a sore throat. Pertinent negatives include no chills, ear pain, headaches, shortness of breath, sneezing or swollen glands. Past treatments include antibiotics (cough syrup, cold medicine). The treatment provided mild relief.   Was taking just straight amoxicillin - had plugged up nasal passage  - took abx Sat- Monday  Review of Systems  Constitutional: Negative for chills.  HENT: Positive for congestion, postnasal drip, rhinorrhea, sinus pressure and sore throat. Negative for dental problem, ear pain and sneezing.   Respiratory: Positive for cough. Negative for shortness of breath.   Neurological: Negative for headaches.    10/10/2018: MyChart message - sinus symptoms started on 2/21 and started amoxicillin which was left over 10/10/2018: Telephone - starte abx and not improving on medication. Fever on 2/24  Social History   Tobacco Use  Smoking Status Never Smoker  Smokeless Tobacco Former Systems developer  . Types: Chew        Objective:    BP Readings from Last 3 Encounters:  10/10/18 138/74  08/28/18 120/68  02/28/18 124/80   Wt Readings from Last 3 Encounters:  10/10/18 157 lb 8 oz (71.4 kg)  08/28/18 164 lb (74.4 kg)  02/28/18 157 lb 8 oz (71.4 kg)    BP 138/74 (BP Location: Right Arm, Patient Position: Sitting, Cuff Size: Large)   Pulse 71   Temp 98.2 F (36.8 C) (Oral)   Resp 16   Ht 5' 6.25" (1.683 m)   Wt 157 lb 8 oz (71.4 kg)   SpO2 96%   BMI 25.23 kg/m    Physical Exam Constitutional:      General:  He is not in acute distress.    Appearance: He is well-developed. He is not ill-appearing.  HENT:     Head: Normocephalic and atraumatic.     Right Ear: Tympanic membrane and ear canal normal.     Left Ear: Tympanic membrane and ear canal normal.     Nose: Mucosal edema and rhinorrhea present.     Right Sinus: No maxillary sinus tenderness or frontal sinus tenderness.     Left Sinus: No maxillary sinus tenderness or frontal sinus tenderness.     Mouth/Throat:     Pharynx: Uvula midline. Posterior oropharyngeal erythema present. No oropharyngeal exudate.     Tonsils: Swelling: 0 on the right. 0 on the left.  Neck:     Musculoskeletal: Neck supple.  Cardiovascular:     Rate and Rhythm: Normal rate and regular rhythm.     Heart sounds: No murmur.  Pulmonary:     Effort: Pulmonary effort is normal. No respiratory distress.     Breath sounds: Normal breath sounds.  Lymphadenopathy:     Cervical: No cervical adenopathy.  Skin:    General: Skin is warm and dry.     Capillary Refill: Capillary refill takes less than 2 seconds.  Neurological:     Mental Status: He is alert.           Assessment & Plan:   Problem List Items Addressed This  Visit    None    Visit Diagnoses    Viral URI    -  Primary     Discussed that likely viral illness and if not improving in next 4-5 days to let us know. Could consider Abx then, but likely not needed now  Return if symptoms worsen or fail to improve.  Lesleigh Noe, MD

## 2018-10-10 NOTE — Telephone Encounter (Signed)
Thanks for checking on patient.   We don't get mychart messages until they are routed to [place].   I didn't get his message until yesterday and by then I was already seeing patients.   I would advise patients with new symptoms not to send a message via mychart and call triage instead.    I thank Dr. Einar Pheasant for seeing patient.

## 2018-10-10 NOTE — Telephone Encounter (Signed)
I saw pt as he was leaving his appt and explained about the my chart procedures and in future if new symptoms to call our office and talk with a triage nurse. Pt voiced understanding and said that would not be a problem and he appreciated Dr Damita Dunnings and [facility]. FYI to Dr Damita Dunnings.

## 2018-10-10 NOTE — Telephone Encounter (Signed)
Please triage patient.  See my chart message.  Thanks. 

## 2018-10-10 NOTE — Patient Instructions (Signed)

## 2018-10-11 ENCOUNTER — Other Ambulatory Visit: Payer: Self-pay | Admitting: Family Medicine

## 2018-10-11 NOTE — Telephone Encounter (Signed)
Noted. Thanks.

## 2018-11-28 DIAGNOSIS — L570 Actinic keratosis: Secondary | ICD-10-CM | POA: Diagnosis not present

## 2018-12-17 ENCOUNTER — Other Ambulatory Visit: Payer: Self-pay | Admitting: Family Medicine

## 2018-12-28 ENCOUNTER — Encounter: Payer: Self-pay | Admitting: Family Medicine

## 2019-01-31 DIAGNOSIS — L280 Lichen simplex chronicus: Secondary | ICD-10-CM | POA: Diagnosis not present

## 2019-01-31 DIAGNOSIS — L821 Other seborrheic keratosis: Secondary | ICD-10-CM | POA: Diagnosis not present

## 2019-01-31 DIAGNOSIS — L570 Actinic keratosis: Secondary | ICD-10-CM | POA: Diagnosis not present

## 2019-02-25 ENCOUNTER — Other Ambulatory Visit: Payer: Self-pay | Admitting: Family Medicine

## 2019-02-25 ENCOUNTER — Encounter: Payer: Self-pay | Admitting: Family Medicine

## 2019-02-25 DIAGNOSIS — E119 Type 2 diabetes mellitus without complications: Secondary | ICD-10-CM

## 2019-02-27 ENCOUNTER — Encounter: Payer: Self-pay | Admitting: Family Medicine

## 2019-03-01 ENCOUNTER — Other Ambulatory Visit: Payer: Self-pay

## 2019-03-01 ENCOUNTER — Other Ambulatory Visit (INDEPENDENT_AMBULATORY_CARE_PROVIDER_SITE_OTHER): Payer: Medicare Other

## 2019-03-01 DIAGNOSIS — E119 Type 2 diabetes mellitus without complications: Secondary | ICD-10-CM | POA: Diagnosis not present

## 2019-03-01 LAB — LIPID PANEL
Cholesterol: 143 mg/dL (ref 0–200)
HDL: 41.2 mg/dL (ref >39.00)
LDL Cholesterol: 73 mg/dL (ref 0–99)
NonHDL: 101.97
Total CHOL/HDL Ratio: 3
Triglycerides: 146 mg/dL (ref 0.0–149.0)
VLDL: 29.2 mg/dL (ref 0.0–40.0)

## 2019-03-01 LAB — COMPREHENSIVE METABOLIC PANEL
ALT: 21 U/L (ref 0–53)
AST: 16 U/L (ref 0–37)
Albumin: 4.1 g/dL (ref 3.5–5.2)
Alkaline Phosphatase: 52 U/L (ref 39–117)
BUN: 21 mg/dL (ref 6–23)
CO2: 30 mEq/L (ref 19–32)
Calcium: 8.6 mg/dL (ref 8.4–10.5)
Chloride: 106 mEq/L (ref 96–112)
Creatinine, Ser: 1 mg/dL (ref 0.40–1.50)
GFR: 72.58 mL/min (ref >60.00)
Glucose, Bld: 155 mg/dL — ABNORMAL HIGH (ref 70–99)
Potassium: 4.1 mEq/L (ref 3.5–5.1)
Sodium: 141 mEq/L (ref 135–145)
Total Bilirubin: 0.5 mg/dL (ref 0.2–1.2)
Total Protein: 5.9 g/dL — ABNORMAL LOW (ref 6.0–8.3)

## 2019-03-01 LAB — MICROALBUMIN / CREATININE URINE RATIO
Creatinine,U: 161.6 mg/dL
Microalb Creat Ratio: 0.4 mg/g (ref 0.0–30.0)
Microalb, Ur: <0.7 mg/dL (ref 0.0–1.9)

## 2019-03-01 LAB — HEMOGLOBIN A1C: Hgb A1c MFr Bld: 7 % — ABNORMAL HIGH (ref 4.6–6.5)

## 2019-03-05 ENCOUNTER — Ambulatory Visit: Payer: Medicare Other

## 2019-03-05 ENCOUNTER — Ambulatory Visit (INDEPENDENT_AMBULATORY_CARE_PROVIDER_SITE_OTHER): Payer: Medicare Other | Admitting: Family Medicine

## 2019-03-05 ENCOUNTER — Other Ambulatory Visit: Payer: Self-pay

## 2019-03-05 ENCOUNTER — Encounter: Payer: Self-pay | Admitting: Family Medicine

## 2019-03-05 DIAGNOSIS — E119 Type 2 diabetes mellitus without complications: Secondary | ICD-10-CM

## 2019-03-05 DIAGNOSIS — E785 Hyperlipidemia, unspecified: Secondary | ICD-10-CM | POA: Diagnosis not present

## 2019-03-05 DIAGNOSIS — R454 Irritability and anger: Secondary | ICD-10-CM

## 2019-03-05 DIAGNOSIS — Z0000 Encounter for general adult medical examination without abnormal findings: Secondary | ICD-10-CM

## 2019-03-05 DIAGNOSIS — Z7189 Other specified counseling: Secondary | ICD-10-CM

## 2019-03-05 NOTE — Patient Instructions (Addendum)
Check with your insurance to see if they will cover the shingrix shot.  Recheck in about 4 months.  The only lab you need to have done for your next diabetic visit is an A1c.  We can do this with a fingerstick test at the office visit.  You do not need a lab visit ahead of time for this.  It does not matter if you are fasting when the lab is done.    If the aches aren't better with hydration, then stop the simvastatin for 2 weeks and let me know if you are better.    Take care.  Glad to see you.

## 2019-03-05 NOTE — Progress Notes (Signed)
I have personally reviewed the Medicare Annual Wellness questionnaire and have noted 1. The patient's medical and social history 2. Their use of alcohol, tobacco or illicit drugs 3. Their current medications and supplements 4. The patient's functional ability including ADL's, fall risks, home safety risks and hearing or visual             impairment. 5. Diet and physical activities 6. Evidence for depression or mood disorders  The patients weight, height, BMI have been recorded in the chart and visual acuity is per eye clinic.  I have made referrals, counseling and provided education to the patient based review of the above and I have provided the pt with a written personalized care plan for preventive services.  Provider list updated- see scanned forms.  Routine anticipatory guidance given to patient.  See health maintenance. The possibility exists that previously documented standard health maintenance information may have been brought forward from a previous encounter into this note.  If needed, that same information has been updated to reflect the current situation based on today's encounter.     Flu yearly Shingles discussed with patient about Shingrix vaccine. PNA up-to-date Tetanus 2014 Colonoscopy done 2018 Prostate cancer screening declined by patient.  This is reasonable. Advance directive daughter Angelique Blonder designated if patient were incapacitated. Cognitive function addressed- see scanned forms- and if abnormal then additional documentation follows.   Mood d/w pt.  Still on SSRI.  He failed taper prev and wanted to continue.  Stressors with pandemic d/w pt.  No SI/HI.    Elevated Cholesterol: Using medications without problems: yes Muscle aches: see below Diet compliance:yes Exercise:yes Labs d/w pt.   Diabetes:  Using medications without difficulties: yes Hypoglycemic episodes:no Hyperglycemic episodes:no Feet problems:no Blood Sugars averaging: ~140s eye exam  within last year:yes A1c/labs d/w pt.   He has some B leg cramping at night.  No exertional.  He thought it was due to relative dehydration.  On statin, d/w pt.  Sx going on for a few weeks.   He has been working a lot in the yard, in the garden in the heat index has been high recently.  We talked about rationale for not doing COVID antibody testing.  It would not change management at this point and I cannot recommend that he get that testing done.  He understood that but still wanted to get the testing done anyway.  I cannot recommend a test that would not change his management.  If he wanted to participate in a population study that is checking for antibody presence in citizens in [place], then I think he could check with the [facility] med [facility] as I believe they are performing that type of study.  Discussed.  I did not order the test.  He accepted that.  It was a rational and pleasant conversation.  His wife had foot surgery and she is getting better.  His family is well o/w.    PMH and SH reviewed  Meds, vitals, and allergies reviewed.   ROS: Per HPI.  Unless specifically indicated otherwise in HPI, the patient denies:  General: fever. Eyes: acute vision changes ENT: sore throat Cardiovascular: chest pain Respiratory: SOB GI: vomiting GU: dysuria Musculoskeletal: acute back pain Derm: acute rash Neuro: acute motor dysfunction Psych: worsening mood Endocrine: polydipsia Heme: bleeding Allergy: hayfever  GEN: nad, alert and oriented HEENT: mucous membranes moist NECK: supple w/o LA CV: rrr. 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   No skin breakdown Calluses noted, no ulceration.  Normal DP pulses Normal sensation to light touch and monofilament Nails normal

## 2019-03-06 DIAGNOSIS — Z03818 Encounter for observation for suspected exposure to other biological agents ruled out: Secondary | ICD-10-CM | POA: Diagnosis not present

## 2019-03-07 NOTE — Assessment & Plan Note (Addendum)
Labs discussed with patient.  Continue as is.  Continue work on diet and exercise.  He agrees.  I do not think the cramping he was having was statin related.  It is more likely to be due to relative dehydration/heavy yard work.  He will update me as needed.  Encouraged adequate hydration.  If he continues to have aches then he can stop the statin and update me as needed.  See after visit summary.

## 2019-03-07 NOTE — Assessment & Plan Note (Signed)
Flu yearly Shingles discussed with patient about Shingrix vaccine. PNA up-to-date Tetanus 2014 Colonoscopy done 2018 Prostate cancer screening declined by patient.  This is reasonable. Advance directive daughter Angelique Blonder designated if patient were incapacitated. Cognitive function addressed- see scanned forms- and if abnormal then additional documentation follows.

## 2019-03-07 NOTE — Assessment & Plan Note (Signed)
Advance directive-daughter Julie Verchick designated if patient were incapacitated.  ?

## 2019-03-07 NOTE — Assessment & Plan Note (Signed)
Still on SSRI.  He failed taper prev and wanted to continue.  Stressors with pandemic d/w pt.  No SI/HI.  Continue as is.  He agrees.

## 2019-03-07 NOTE — Assessment & Plan Note (Signed)
No change in meds at this point.  Recheck periodically.  He agrees.

## 2019-03-10 ENCOUNTER — Encounter: Payer: Self-pay | Admitting: Family Medicine

## 2019-03-24 ENCOUNTER — Encounter: Payer: Self-pay | Admitting: Family Medicine

## 2019-03-26 NOTE — Telephone Encounter (Signed)
I updated vaccine in the chart.

## 2019-03-31 ENCOUNTER — Other Ambulatory Visit: Payer: Self-pay | Admitting: Family Medicine

## 2019-04-27 DIAGNOSIS — Z23 Encounter for immunization: Secondary | ICD-10-CM | POA: Diagnosis not present

## 2019-04-29 ENCOUNTER — Encounter: Payer: Self-pay | Admitting: Family Medicine

## 2019-05-21 ENCOUNTER — Encounter: Payer: Self-pay | Admitting: Family Medicine

## 2019-06-06 ENCOUNTER — Encounter: Payer: Self-pay | Admitting: Family Medicine

## 2019-06-17 ENCOUNTER — Other Ambulatory Visit: Payer: Self-pay | Admitting: Family Medicine

## 2019-07-03 ENCOUNTER — Ambulatory Visit (INDEPENDENT_AMBULATORY_CARE_PROVIDER_SITE_OTHER): Payer: Medicare Other | Admitting: Family Medicine

## 2019-07-03 ENCOUNTER — Encounter: Payer: Self-pay | Admitting: Family Medicine

## 2019-07-03 ENCOUNTER — Other Ambulatory Visit: Payer: Self-pay

## 2019-07-03 DIAGNOSIS — S81801A Unspecified open wound, right lower leg, initial encounter: Secondary | ICD-10-CM | POA: Diagnosis not present

## 2019-07-03 NOTE — Progress Notes (Signed)
   Subjective:    Patient ID: Greg Rogers., male    DOB: 1941/08/04, 78 y.o.   MRN: [id number]  HPI  78 yo pt of Dr Damita Dunnings presents with cut on R leg 12 days ago  He has a h/o diabetes   He was hunting in high grass  Unsure how it happened but he thinks it may have been from stepping over a log   R lateral lower leg  Also scrape on inner leg-this healed   Looks like a gauge - it oozed more than bled  He left it open originally and used neosporin Then realized boot rubbed it so he used a band aid   Not hurting  Not red Has not changed  No swelling  No h/o circulation problems   Cleaned with peroxide  Band aid now    Td was 2/14    Review of Systems  Constitutional: Negative for activity change, appetite change, fatigue, fever and unexpected weight change.  HENT: Negative for congestion, rhinorrhea, sore throat and trouble swallowing.   Eyes: Negative for pain, redness, itching and visual disturbance.  Respiratory: Negative for cough, chest tightness, shortness of breath and wheezing.   Cardiovascular: Negative for chest pain and palpitations.  Gastrointestinal: Negative for abdominal pain, blood in stool, constipation, diarrhea and nausea.  Endocrine: Negative for cold intolerance, heat intolerance, polydipsia and polyuria.  Genitourinary: Negative for difficulty urinating, dysuria, frequency and urgency.  Musculoskeletal: Negative for arthralgias, joint swelling and myalgias.  Skin: Negative for pallor and rash.       Wound on R lower leg  Neurological: Negative for dizziness, tremors, weakness, numbness and headaches.  Hematological: Negative for adenopathy. Does not bruise/bleed easily.  Psychiatric/Behavioral: Negative for decreased concentration and dysphoric mood. The patient is not nervous/anxious.        Objective:   Physical Exam Constitutional:      General: He is not in acute distress.    Appearance: Normal appearance. He is normal weight. He is not  ill-appearing.  HENT:     Head: Normocephalic and atraumatic.  Eyes:     Extraocular Movements: Extraocular movements intact.     Pupils: Pupils are equal, round, and reactive to light.  Neck:     Musculoskeletal: Normal range of motion.  Cardiovascular:     Rate and Rhythm: Regular rhythm. Bradycardia present.     Pulses: Normal pulses.     Comments: Pedal pulses are palpable Lymphadenopathy:     Cervical: No cervical adenopathy.  Skin:    Comments: 1.5 cm superficial wound on R lateral lower leg Oval shaped Pink in color with scant white granulation tissue  No erythema/drainage/tenderness or swelling Leg and foot are well perfused  Neurological:     Mental Status: He is alert.     Sensory: No sensory deficit.  Psychiatric:        Mood and Affect: Mood normal.           Assessment & Plan:   Problem List Items Addressed This Visit      Other   Wound of right leg, initial encounter    Initially from a scrape 12 d ago  Superficial and not infected looking Suspect this will continue to heal normally  inst to clean with soap and water/do not submerge abx oint otc /loose covering  Protect from Financial controller for redness/swelling/pain/drainage Td is utd

## 2019-07-03 NOTE — Assessment & Plan Note (Signed)
Initially from a scrape 12 d ago  Superficial and not infected looking Suspect this will continue to heal normally  inst to clean with soap and water/do not submerge abx oint otc /loose covering  Protect from friction  Watch for redness/swelling/pain/drainage Td is utd

## 2019-07-03 NOTE — Patient Instructions (Signed)
Wound looks good and healing  Keep clean with soap and water  Don't submerge- just let soap and water run over it  Antibiotic ointment over the counter is fine  Continue to lightly cover until it looks more dry   Alert [place] if red/swollen/more painful or if more drainage

## 2019-07-11 ENCOUNTER — Other Ambulatory Visit: Payer: Self-pay

## 2019-07-18 ENCOUNTER — Encounter: Payer: Self-pay | Admitting: Family Medicine

## 2019-07-19 ENCOUNTER — Other Ambulatory Visit: Payer: Self-pay

## 2019-07-19 ENCOUNTER — Encounter: Payer: Self-pay | Admitting: Family Medicine

## 2019-07-19 ENCOUNTER — Ambulatory Visit (INDEPENDENT_AMBULATORY_CARE_PROVIDER_SITE_OTHER): Payer: Medicare Other | Admitting: Family Medicine

## 2019-07-19 VITALS — BP 110/58 | HR 65 | Temp 97.2°F | Ht 66.25 in | Wt 158.5 lb

## 2019-07-19 DIAGNOSIS — E785 Hyperlipidemia, unspecified: Secondary | ICD-10-CM

## 2019-07-19 DIAGNOSIS — E119 Type 2 diabetes mellitus without complications: Secondary | ICD-10-CM | POA: Diagnosis not present

## 2019-07-19 LAB — POCT GLYCOSYLATED HEMOGLOBIN (HGB A1C): Hemoglobin A1C: 7 % — AB (ref 4.0–5.6)

## 2019-07-19 MED ORDER — SIMVASTATIN 10 MG PO TABS: 5.0000 mg | ORAL_TABLET | Freq: Every day | ORAL | Status: DC

## 2019-07-19 MED ORDER — SIMVASTATIN 10 MG PO TABS: 10.0000 mg | ORAL_TABLET | Freq: Every day | ORAL | Status: DC

## 2019-07-19 NOTE — Patient Instructions (Addendum)
If you still have cramps on 5mg  of simvastatin, then let me know.   Update me as needed.  Plan on recheck in about 6 months at a yearly visit.  Take care.  Glad to see you.

## 2019-07-19 NOTE — Progress Notes (Signed)
This visit occurred during the SARS-CoV-2 public health emergency.  Safety protocols were in place, including screening questions prior to the visit, additional usage of staff PPE, and extensive cleaning of exam room while observing appropriate contact time as indicated for disinfecting solutions.   Diabetes:  Using medications without difficulties: yes Hypoglycemic episodes: no Hyperglycemic episodes: no Feet problems:no Blood Sugars averaging: ~135 usually.   eye exam within last year:yes He is back on metformin 1000mg  BID.  A1c discussed with patient at office visit. 7.  Stable.  He thought the simvastatin was causing cramps, resolved off med.  D/w pt.  He could try dec to 5mg  simvastatin.    He went hunting in [place] and [place].    He isn't lightheaded, d/w pt.    Meds, vitals, and allergies reviewed.   ROS: Per HPI unless specifically indicated in ROS section   GEN: nad, alert and oriented HEENT: ncat NECK: supple w/o LA CV: rrr. 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   Normal DP pulses Normal sensation to light touch and monofilament Nails normal

## 2019-07-20 NOTE — Telephone Encounter (Signed)
Discussed with patient at office visit

## 2019-07-22 MED ORDER — ACCU-CHEK FASTCLIX LANCETS MISC: 3 refills | Status: DC

## 2019-07-22 NOTE — Assessment & Plan Note (Signed)
A1c discussed with patient at office visit. 7.  Stable.  Continue as is.  He agrees.  Recheck periodically.  He will update me as needed.

## 2019-07-22 NOTE — Assessment & Plan Note (Signed)
He thought the simvastatin was causing cramps, resolved off med.  D/w pt.  He could try dec to 5mg  simvastatin.   He will try that and then update me as needed.

## 2019-07-23 ENCOUNTER — Other Ambulatory Visit: Payer: Self-pay | Admitting: *Deleted

## 2019-07-23 MED ORDER — ACCU-CHEK FASTCLIX LANCETS MISC: 3 refills | Status: DC

## 2019-08-14 LAB — HM DIABETES EYE EXAM

## 2019-08-29 ENCOUNTER — Other Ambulatory Visit: Payer: Self-pay | Admitting: Family Medicine

## 2019-09-04 ENCOUNTER — Encounter: Payer: Self-pay | Admitting: Family Medicine

## 2019-09-07 ENCOUNTER — Encounter: Payer: Self-pay | Admitting: Ophthalmology

## 2019-09-23 ENCOUNTER — Encounter (HOSPITAL_COMMUNITY): Payer: Self-pay | Admitting: *Deleted

## 2019-09-23 ENCOUNTER — Other Ambulatory Visit: Payer: Self-pay

## 2019-09-23 ENCOUNTER — Emergency Department (HOSPITAL_COMMUNITY)
Admission: EM | Admit: 2019-09-23 | Discharge: 2019-09-24 | Disposition: A | Discharge status: Home / Self Care | Payer: Medicare Other | Attending: Emergency Medicine | Admitting: Emergency Medicine

## 2019-09-23 DIAGNOSIS — E119 Type 2 diabetes mellitus without complications: Secondary | ICD-10-CM | POA: Diagnosis not present

## 2019-09-23 DIAGNOSIS — Z7984 Long term (current) use of oral hypoglycemic drugs: Secondary | ICD-10-CM | POA: Insufficient documentation

## 2019-09-23 DIAGNOSIS — Z23 Encounter for immunization: Secondary | ICD-10-CM | POA: Diagnosis not present

## 2019-09-23 DIAGNOSIS — W01198A Fall on same level from slipping, tripping and stumbling with subsequent striking against other object, initial encounter: Secondary | ICD-10-CM | POA: Insufficient documentation

## 2019-09-23 DIAGNOSIS — Y92094 Garage of other non-institutional residence as the place of occurrence of the external cause: Secondary | ICD-10-CM | POA: Diagnosis not present

## 2019-09-23 DIAGNOSIS — Y999 Unspecified external cause status: Secondary | ICD-10-CM | POA: Insufficient documentation

## 2019-09-23 DIAGNOSIS — S01111A Laceration without foreign body of right eyelid and periocular area, initial encounter: Secondary | ICD-10-CM | POA: Diagnosis not present

## 2019-09-23 DIAGNOSIS — Y9389 Activity, other specified: Secondary | ICD-10-CM | POA: Insufficient documentation

## 2019-09-23 DIAGNOSIS — S0181XA Laceration without foreign body of other part of head, initial encounter: Secondary | ICD-10-CM

## 2019-09-23 MED ORDER — TETANUS-DIPHTH-ACELL PERTUSSIS 5-2.5-18.5 LF-MCG/0.5 IM SUSP
0.5000 mL | Freq: Once | INTRAMUSCULAR | Status: AC
  Administered 2019-09-24: 0.5 mL via INTRAMUSCULAR
  Filled 2019-09-23: qty 0.5

## 2019-09-23 NOTE — ED Provider Notes (Addendum)
TIME SEEN: 11:10 PM  CHIEF COMPLAINT: Head injury  HPI: Patient is a 79 year old male with history of diabetes, hyperlipidemia who presents to the emergency department with a laceration above the right eyebrow after a head injury that occurred prior to arrival.  States he slipped in his garage and hit his head on the side of a Sydell Axon.  He did fall down to the ground but states he did not hit his head on the ground.  There is no loss of consciousness.  He is not on blood thinners.  Per records, last tetanus vaccination was in 2014.  No neck or back pain.  No numbness or weakness.  ROS: See HPI Constitutional: no fever  Eyes: no drainage  ENT: no runny nose   Cardiovascular:  no chest pain  Resp: no SOB  GI: no vomiting GU: no dysuria Integumentary: no rash  Allergy: no hives  Musculoskeletal: no leg swelling  Neurological: no slurred speech ROS otherwise negative  PAST MEDICAL HISTORY/PAST SURGICAL HISTORY:  Past Medical History:  Diagnosis Date  . Abnormal GGT test    With otherwise normal work up.  . Arthritis   . Diabetes mellitus without complication ([place])   . Foot pain    Bilateral  . Hyperglycemia   . Hyperlipidemia   . Nephrolithiasis   . 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 Persistent disorder of initiating or maintaining sleep   . Pes planus   . Rosacea   . Snoring     MEDICATIONS:  Prior to Admission medications   Medication Sig Start Date End Date Taking? Authorizing Provider  ACCU-CHEK AVIVA PLUS test strip USE TO CHECK BLOOD SUGAR ONCE DAILY. DIAGNOSIS: E11.9 NON-INSULIN DEPENDENT. 08/29/19   Tonia Ghent, MD  Accu-Chek FastClix Lancets MISC Use as needed to check sugar daily.  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 07/23/19   Tonia Ghent, MD  aspirin 81 MG EC tablet Take 81 mg by mouth daily.     [provider]  Blood Glucose Monitoring Suppl (ACCU-CHEK AVIVA PLUS) w/Device KIT Use as instructed to check blood  sugar once daily.  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 11/21/17   Tonia Ghent, MD  EPINEPHrine 0.3 mg/0.3 mL IJ SOAJ injection Inject 0.3 mLs (0.3 mg total) into the muscle once. Patient taking differently: Inject 0.3 mg into the muscle as needed.  12/22/15   Tonia Ghent, MD  glimepiride (AMARYL) 2 MG tablet TAKE 1 TABLET BY MOUTH  DAILY BEFORE BREAKFAST 04/02/19   Tonia Ghent, MD  metFORMIN (GLUCOPHAGE) 500 MG tablet TAKE 2 TABLETS BY MOUTH 2  TIMES DAILY WITH MEALS 09/25/18   Tonia Ghent, MD  Multiple Vitamin (MULTIVITAMIN) tablet Take 1 tablet by mouth daily.     [provider]  nitroGLYCERIN (NITROSTAT) 0.4 MG SL tablet Place 1 tablet (0.4 mg total) under the tongue every 5 (five) minutes as needed for chest pain (max 3 doses/15 min.call MD/to ER if used). 04/04/13   Tonia Ghent, MD  omeprazole (PRILOSEC) 20 MG capsule Take 20 mg by mouth daily as needed. As needed    [provider]  sertraline (ZOLOFT) 50 MG tablet TAKE 1 TABLET BY MOUTH  DAILY 04/02/19   Tonia Ghent, MD  simvastatin (ZOCOR) 10 MG tablet Take 0.5 tablets (5 mg total) by mouth at bedtime. 07/19/19   Tonia Ghent, MD  triamcinolone cream (KENALOG) 0.1 % Apply 1 application  topically 2 (two) times daily as needed. 05/18/17   Tonia Ghent, MD    ALLERGIES:  Allergies  Allergen Reactions  . Azo [Phenazopyridine] Other (See Comments)    diarrhea  . Bee Venom   . Erythromycin     Oral ulcers (but tolerates zithromax) No problem with erythromycin but does not tolerate the "old mycin drugs"  . Fire [employer]     Local reaction, irritation.   . Pneumococcal Vaccine Polyvalent     REACTION: rash  . Wasp Venom     Exaggerated local reaction    SOCIAL HISTORY:  Social History   Tobacco Use  . Smoking status: Never Smoker  . Smokeless tobacco: Former Systems developer    Types: Chew  Substance Use Topics  . Alcohol use: Yes    Alcohol/week: 13.0 - 20.0 standard drinks    Types: 7 - 14  Glasses of wine, 6 Standard drinks or equivalent per week    Comment: 1-2 glasses of wine at night    FAMILY HISTORY: Family History  Problem Relation Age of Onset  . Heart disease Father 69       First MI  . Hyperlipidemia Father   . Heart attack Father   . Diabetes Brother   . Hyperlipidemia Brother   . Hypertension Brother   . Heart disease Brother        CAD s/p stent  . CAD Brother   . Heart disease Brother   . Heart disease Brother   . Diabetes Sister        DM2  . Hyperlipidemia Sister   . Arthritis Other   . Diabetes Other   . Prostate cancer Neg Hx   . Colon cancer Neg Hx     EXAM: BP (!) 168/80 (BP Location: Right Arm)   Pulse 96   Temp 98.3 F (36.8 C)   Resp 16   SpO2 100%  CONSTITUTIONAL: Alert and oriented and responds appropriately to questions. Well-appearing; well-nourished HEAD: Normocephalic, patient has a 4 cm T-shaped laceration just above the right eyebrow that is hemostatic and well approximated and superficial in nature without sign of foreign body EYES: Conjunctivae clear, pupils appear equal, EOM appear intact ENT: normal nose; moist mucous membranes NECK: Supple, normal ROM, no midline spinal tenderness or step-off or deformity CARD: RRR; S1 and S2 appreciated; no murmurs, no clicks, no rubs, no gallops CHEST: Chest wall is nontender to palpation without deformity RESP: Normal chest excursion without splinting or tachypnea; breath sounds clear and equal bilaterally; no wheezes, no rhonchi, no rales, no hypoxia or respiratory distress, speaking full sentences ABD/GI: Normal bowel sounds; non-distended; soft, non-tender, no rebound, no guarding, no peritoneal signs, no hepatosplenomegaly BACK:  The back appears normal, no midline spinal tenderness or step-off or deformity EXT: Normal ROM in all joints; no deformity noted, no edema; no cyanosis SKIN: Normal color for age and race; warm; no rash on exposed skin NEURO: Moves all extremities  equally, normal speech, sensation to light touch intact diffusely PSYCH: The patient's mood and manner are appropriate.   MEDICAL DECISION MAKING: Patient here after head injury.  Have recommended CT of the head and we have discussed risks of intracranial hemorrhage in his age group even without headache or neurologic deficits.  Patient refuses head scan at this time.  He understands risk.  Last tetanus vaccination in 2014.  Given open laceration that he had on a metal object that was in his garage, will update today.  Laceration just  above the eyebrow and is already well approximated.  I feel this can be repaired easily with Dermabond and does not need sutures.  He is comfortable with this plan.  ED PROGRESS: Wound repaired with Dermabond.  Tetanus vaccination updated.  Discussed return precautions.  Discussed wound care instructions.  Will discharge home.   At this time, I do not feel there is any life-threatening condition present. I have reviewed, interpreted and discussed all results (EKG, imaging, lab, urine as appropriate) and exam findings with patient/family. I have reviewed nursing notes and appropriate previous records.  I feel the patient is safe to be discharged home without further emergent workup and can continue workup as an outpatient as needed. Discussed usual and customary return precautions. Patient/family verbalize understanding and are comfortable with this plan.  Outpatient follow-up has been provided as needed. All questions have been answered.     Everhett Bozard. was evaluated in Emergency Department on 09/23/2019 for the symptoms described in the history of present illness. He was evaluated in the context of the global COVID-19 pandemic, which necessitated consideration that the patient might be at risk for infection with the SARS-CoV-2 virus that causes COVID-19. Institutional protocols and algorithms that pertain to the evaluation of patients at risk for COVID-19 are in a  state of rapid change based on information released by regulatory bodies including the CDC and federal and state organizations. These policies and algorithms were followed during the patient's care in the ED.  Patient was seen wearing N95, face shield, gloves.    Edrie Ehrich, Delice Bison, DO 09/23/19 [id number]    LACERATION REPAIR Performed by: Pryor Curia Authorized by: Pryor Curia Consent: Verbal consent obtained. Risks and benefits: risks, benefits and alternatives were discussed Consent given by: patient Patient identity confirmed: provided demographic data Prepped and Draped in normal sterile fashion Wound explored  Laceration Location: superior to edge of right eyebrow  Laceration Length: 4 cm  No Foreign Bodies seen or palpated  Anesthesia: none Irrigation method: syringe Amount of cleaning: standard  Skin closure: dermabond  Number of sutures: 0  Technique: Irrigate with saline and cleaned with cholraprep.  Wound repaired using Dermabond.  Wound hemostatic without foreign body.  Wound was already well approximated.  Wound was T-shaped in nature.  Just superior to the lateral edge of the right eyebrow.  Patient tolerance: Patient tolerated the procedure well with no immediate complications.    Yuki Brunsman, Delice Bison, DO 09/24/19 [id number]

## 2019-09-23 NOTE — Discharge Instructions (Signed)
You may take Tylenol 1000 mg every 6 hours as needed for pain.  We have recommended a head CT at this time which you have declined.  If you develop severe headache, vomiting, vision changes, numbness or weakness on one side of your body, confusion, I recommend that she return to the emergency department immediately.  You may clean your wound gently with warm soap and water.  The glue will disintegrate over time.  Please do not apply any bacitracin, Neosporin or triple antibiotic ointment to this wound.

## 2019-09-23 NOTE — ED Triage Notes (Signed)
Patient arrives reporting he tripped over an air hose and hit the right side of his head on a arm rest that was metal. He has a lac over the right eye brow. Bleeding controlled. No blood thinners, No LOC, tetanus < 10 years ago.

## 2019-09-24 DIAGNOSIS — S01111A Laceration without foreign body of right eyelid and periocular area, initial encounter: Secondary | ICD-10-CM | POA: Diagnosis not present

## 2019-09-26 ENCOUNTER — Encounter: Payer: Self-pay | Admitting: Family Medicine

## 2019-09-26 NOTE — Telephone Encounter (Signed)
FYI to Dr. Damita Dunnings.  Tdap already documented in chart.

## 2019-09-30 ENCOUNTER — Encounter: Payer: Self-pay | Admitting: Family Medicine

## 2019-10-02 ENCOUNTER — Other Ambulatory Visit: Payer: Self-pay | Admitting: Family Medicine

## 2019-10-02 ENCOUNTER — Encounter: Payer: Self-pay | Admitting: Family Medicine

## 2019-10-02 DIAGNOSIS — N5089 Other specified disorders of the male genital organs: Secondary | ICD-10-CM

## 2019-10-02 NOTE — Telephone Encounter (Signed)
Patient left a voicemail stating that he wanted to make sure that Dr. Damita Dunnings saw this message today.

## 2019-10-03 ENCOUNTER — Other Ambulatory Visit: Payer: Self-pay | Admitting: Family Medicine

## 2019-10-03 ENCOUNTER — Ambulatory Visit
Admission: RE | Admit: 2019-10-03 | Discharge: 2019-10-03 | Disposition: A | Discharge status: Home / Self Care | Payer: Medicare Other | Source: Ambulatory Visit | Attending: Family Medicine | Admitting: Family Medicine

## 2019-10-03 ENCOUNTER — Encounter: Payer: Self-pay | Admitting: Family Medicine

## 2019-10-03 DIAGNOSIS — N5089 Other specified disorders of the male genital organs: Secondary | ICD-10-CM

## 2019-10-03 DIAGNOSIS — N433 Hydrocele, unspecified: Secondary | ICD-10-CM | POA: Diagnosis not present

## 2019-11-05 ENCOUNTER — Encounter: Payer: Self-pay | Admitting: Family Medicine

## 2019-11-05 ENCOUNTER — Encounter: Payer: Self-pay | Admitting: *Deleted

## 2019-11-05 DIAGNOSIS — N503 Cyst of epididymis: Secondary | ICD-10-CM | POA: Diagnosis not present

## 2019-11-05 DIAGNOSIS — N5201 Erectile dysfunction due to arterial insufficiency: Secondary | ICD-10-CM | POA: Diagnosis not present

## 2019-11-05 DIAGNOSIS — N4340 Spermatocele of epididymis, unspecified: Secondary | ICD-10-CM | POA: Diagnosis not present

## 2019-11-05 DIAGNOSIS — N433 Hydrocele, unspecified: Secondary | ICD-10-CM | POA: Diagnosis not present

## 2019-11-11 ENCOUNTER — Other Ambulatory Visit: Payer: Self-pay | Admitting: Family Medicine

## 2020-02-15 ENCOUNTER — Other Ambulatory Visit: Payer: Self-pay | Admitting: Family Medicine

## 2020-02-18 ENCOUNTER — Other Ambulatory Visit: Payer: Self-pay | Admitting: Family Medicine

## 2020-04-07 ENCOUNTER — Encounter: Payer: Self-pay | Admitting: Family Medicine

## 2020-05-02 ENCOUNTER — Other Ambulatory Visit: Payer: Self-pay | Admitting: Family Medicine

## 2020-05-08 ENCOUNTER — Encounter: Payer: Self-pay | Admitting: Family Medicine

## 2020-05-27 ENCOUNTER — Encounter: Payer: Self-pay | Admitting: Family Medicine

## 2020-07-18 ENCOUNTER — Other Ambulatory Visit: Payer: Self-pay | Admitting: Family Medicine

## 2020-07-21 ENCOUNTER — Encounter: Payer: Self-pay | Admitting: Family Medicine

## 2020-07-21 NOTE — Telephone Encounter (Signed)
Pharmacy requests refill on: Simvastatin 10 mg   LAST REFILL: 05/03/2020 (Q-90, R-0)  LAST OV: 07/19/2019 NEXT OV: Not Scheduled  PHARMACY: [facility], [place]

## 2020-07-22 ENCOUNTER — Other Ambulatory Visit: Payer: Self-pay | Admitting: Family Medicine

## 2020-07-23 ENCOUNTER — Encounter: Payer: Self-pay | Admitting: Family Medicine

## 2020-07-23 DIAGNOSIS — Z20822 Contact with and (suspected) exposure to covid-19: Secondary | ICD-10-CM | POA: Diagnosis not present

## 2020-07-28 ENCOUNTER — Other Ambulatory Visit: Payer: Self-pay | Admitting: Family Medicine

## 2020-07-28 DIAGNOSIS — E119 Type 2 diabetes mellitus without complications: Secondary | ICD-10-CM

## 2020-07-28 MED ORDER — ACCU-CHEK FASTCLIX LANCETS MISC: 3 refills | Status: DC

## 2020-07-28 MED ORDER — GLIMEPIRIDE 2 MG PO TABS: ORAL_TABLET | ORAL | 3 refills | Status: DC

## 2020-07-28 MED ORDER — ACCU-CHEK AVIVA PLUS VI STRP: ORAL_STRIP | 3 refills | Status: DC

## 2020-07-29 ENCOUNTER — Telehealth: Payer: Self-pay | Admitting: Family Medicine

## 2020-07-29 NOTE — Telephone Encounter (Signed)
-----   Message from Tonia Ghent, MD sent at 07/28/2020  6:57 AM EST ----- Please schedule patient for a nonfasting lab visit. A1C test the week of 12/27. I put in the order. Thanks.

## 2020-08-05 ENCOUNTER — Other Ambulatory Visit: Payer: Self-pay

## 2020-08-05 ENCOUNTER — Other Ambulatory Visit (INDEPENDENT_AMBULATORY_CARE_PROVIDER_SITE_OTHER): Payer: Medicare Other

## 2020-08-05 DIAGNOSIS — E119 Type 2 diabetes mellitus without complications: Secondary | ICD-10-CM | POA: Diagnosis not present

## 2020-08-05 LAB — POCT GLYCOSYLATED HEMOGLOBIN (HGB A1C): Hemoglobin A1C: 6.7 % — AB (ref 4.0–5.6)

## 2020-08-12 DIAGNOSIS — Z20822 Contact with and (suspected) exposure to covid-19: Secondary | ICD-10-CM | POA: Diagnosis not present

## 2020-08-13 ENCOUNTER — Encounter: Payer: Self-pay | Admitting: Family Medicine

## 2020-08-19 LAB — HM DIABETES EYE EXAM

## 2020-09-17 ENCOUNTER — Other Ambulatory Visit: Payer: Self-pay | Admitting: Family Medicine

## 2020-09-17 DIAGNOSIS — E119 Type 2 diabetes mellitus without complications: Secondary | ICD-10-CM

## 2020-09-25 ENCOUNTER — Other Ambulatory Visit: Payer: Self-pay | Admitting: Family Medicine

## 2020-10-01 ENCOUNTER — Other Ambulatory Visit: Payer: Self-pay | Admitting: Family Medicine

## 2020-10-03 ENCOUNTER — Other Ambulatory Visit (INDEPENDENT_AMBULATORY_CARE_PROVIDER_SITE_OTHER): Payer: Medicare Other

## 2020-10-03 ENCOUNTER — Other Ambulatory Visit: Payer: Self-pay

## 2020-10-03 DIAGNOSIS — E119 Type 2 diabetes mellitus without complications: Secondary | ICD-10-CM | POA: Diagnosis not present

## 2020-10-03 LAB — MICROALBUMIN / CREATININE URINE RATIO
Creatinine,U: 121.2 mg/dL
Microalb Creat Ratio: 0.6 mg/g (ref 0.0–30.0)
Microalb, Ur: <0.7 mg/dL (ref 0.0–1.9)

## 2020-10-03 LAB — COMPREHENSIVE METABOLIC PANEL
ALT: 20 U/L (ref 0–53)
AST: 17 U/L (ref 0–37)
Albumin: 4.2 g/dL (ref 3.5–5.2)
Alkaline Phosphatase: 55 U/L (ref 39–117)
BUN: 23 mg/dL (ref 6–23)
CO2: 31 mEq/L (ref 19–32)
Calcium: 9.3 mg/dL (ref 8.4–10.5)
Chloride: 102 mEq/L (ref 96–112)
Creatinine, Ser: 1.09 mg/dL (ref 0.40–1.50)
GFR: 65.26 mL/min (ref >60.00)
Glucose, Bld: 165 mg/dL — ABNORMAL HIGH (ref 70–99)
Potassium: 4.2 mEq/L (ref 3.5–5.1)
Sodium: 138 mEq/L (ref 135–145)
Total Bilirubin: 0.4 mg/dL (ref 0.2–1.2)
Total Protein: 6.7 g/dL (ref 6.0–8.3)

## 2020-10-03 LAB — LIPID PANEL
Cholesterol: 171 mg/dL (ref 0–200)
HDL: 55.7 mg/dL (ref >39.00)
LDL Cholesterol: 95 mg/dL (ref 0–99)
NonHDL: 115.61
Total CHOL/HDL Ratio: 3
Triglycerides: 101 mg/dL (ref 0.0–149.0)
VLDL: 20.2 mg/dL (ref 0.0–40.0)

## 2020-10-03 LAB — HEMOGLOBIN A1C: Hgb A1c MFr Bld: 7 % — ABNORMAL HIGH (ref 4.6–6.5)

## 2020-10-10 ENCOUNTER — Other Ambulatory Visit: Payer: Self-pay

## 2020-10-10 ENCOUNTER — Encounter: Payer: Self-pay | Admitting: Family Medicine

## 2020-10-10 ENCOUNTER — Ambulatory Visit (INDEPENDENT_AMBULATORY_CARE_PROVIDER_SITE_OTHER): Payer: Medicare Other | Admitting: Family Medicine

## 2020-10-10 VITALS — BP 122/60 | HR 75 | Temp 97.8°F | Ht 66.25 in | Wt 159.0 lb

## 2020-10-10 DIAGNOSIS — M5481 Occipital neuralgia: Secondary | ICD-10-CM

## 2020-10-10 DIAGNOSIS — E785 Hyperlipidemia, unspecified: Secondary | ICD-10-CM

## 2020-10-10 DIAGNOSIS — K219 Gastro-esophageal reflux disease without esophagitis: Secondary | ICD-10-CM

## 2020-10-10 DIAGNOSIS — Z7189 Other specified counseling: Secondary | ICD-10-CM

## 2020-10-10 DIAGNOSIS — R454 Irritability and anger: Secondary | ICD-10-CM

## 2020-10-10 DIAGNOSIS — Z0000 Encounter for general adult medical examination without abnormal findings: Secondary | ICD-10-CM | POA: Diagnosis not present

## 2020-10-10 DIAGNOSIS — E119 Type 2 diabetes mellitus without complications: Secondary | ICD-10-CM | POA: Diagnosis not present

## 2020-10-10 MED ORDER — SIMVASTATIN 10 MG PO TABS: 5.0000 mg | ORAL_TABLET | Freq: Every day | ORAL | Status: DC

## 2020-10-10 MED ORDER — ROSUVASTATIN CALCIUM 10 MG PO TABS: 10.0000 mg | ORAL_TABLET | Freq: Every day | ORAL | 3 refills | Status: DC

## 2020-10-10 NOTE — Progress Notes (Signed)
This visit occurred during the SARS-CoV-2 public health emergency.  Safety protocols were in place, including screening questions prior to the visit, additional usage of staff PPE, and extensive cleaning of exam room while observing appropriate contact time as indicated for disinfecting solutions.  I have personally reviewed the Medicare Annual Wellness questionnaire and have noted 1. The patient's medical and social history 2. Their use of alcohol, tobacco or illicit drugs 3. Their current medications and supplements 4. The patient's functional ability including ADL's, fall risks, home safety risks and hearing or visual             impairment. 5. Diet and physical activities 6. Evidence for depression or mood disorders  The patients weight, height, BMI have been recorded in the chart and visual acuity is per eye clinic.  I have made referrals, counseling and provided education to the patient based review of the above and I have provided the pt with a written personalized care plan for preventive services.  Provider list updated- see scanned forms.  Routine anticipatory guidance given to patient.  See health maintenance. The possibility exists that previously documented standard health maintenance information may have been brought forward from a previous encounter into this note.  If needed, that same information has been updated to reflect the current situation based on today's encounter.    Flu up-to-date Shingles up-to-date PNA up-to-date Tetanus 2021 COVID vaccine up-to-date. Colon cancer screening 2018 Prostate cancer screening deferred.  Discussed with patient.  He agrees. Advance directive-daughter Angelique Blonder designated if patient were incapacitated.  Cognitive function addressed- see scanned forms- and if abnormal then additional documentation follows.   He had minimal sx from covid and recovered.  Discussed.  He went skiing at [place] and is going back to [place] soon.  D/w pt.    Diabetes:  Using medications without difficulties: yes Hypoglycemic episodes:no Hyperglycemic episodes:no Feet problems: normal sensation.   Blood Sugars averaging: prev 120-130, now 130-150s.   eye exam within last year: yes He is trying to limit carbs.   Elevated Cholesterol: Using medications without problems: Muscle aches: minimal and tolerable cramping with 5mg  simvastatin.   Diet compliance: yes Exercise: yes We talked about crestor trial.    Inc in GERD but controlled with PPI.  We talked about diet, he didn't know of trigger foods.  He'll go for up to a few months between PPI courses.  No exertional sx.  He'll update me as needed.    Mood d/w pt. mood is good.  No adverse effect on medication.  Compliant with sertraline.  He has noted occasional itching in the right axilla without a rash.  No lump or mass or skin breakdown otherwise.  Normal exam below.  I asked him to update me as needed.  He has noted occasional buzzing on the posterior scalp upon waking.  Last 3 to 4 minutes and is bilateral.  Going on for a few months.  No symptoms on the face.  It did not happen when he changed pillows on vacation.  He does not have any focal neurologic weakness, lip droop, double vision, etc.  It only happens upon waking and then quickly resolves.  PMH and SH reviewed  Meds, vitals, and allergies reviewed.   ROS: Per HPI.  Unless specifically indicated otherwise in HPI, the patient denies:  General: fever. Eyes: acute vision changes ENT: sore throat Cardiovascular: chest pain Respiratory: SOB GI: vomiting GU: dysuria Musculoskeletal: acute back pain Derm: acute rash Neuro: acute motor dysfunction  Psych: worsening mood Endocrine: polydipsia Heme: bleeding Allergy: hayfever  GEN: nad, alert and oriented HEENT: ncat NECK: supple w/o LA CV: rrr. PULM: ctab, no inc wob ABD: soft, +bs EXT: no edema SKIN: no acute rash, axilla normal.  Diabetic foot exam: Normal  inspection No skin breakdown but interdigital irritation.   No calluses  Normal DP pulses Normal sensation to light touch and monofilament Nails normal except for mild fungal changes.  L 2nd toe absent.

## 2020-10-10 NOTE — Patient Instructions (Addendum)
When you get crestor, stop simvastatin.  If you have more aches, then let me know.  Take care.  Glad to see you. Plan on recheck lipids and A1c in about 6 months.

## 2020-10-12 ENCOUNTER — Encounter: Payer: Self-pay | Admitting: Family Medicine

## 2020-10-12 ENCOUNTER — Other Ambulatory Visit: Payer: Self-pay | Admitting: Family Medicine

## 2020-10-12 DIAGNOSIS — M5481 Occipital neuralgia: Secondary | ICD-10-CM | POA: Insufficient documentation

## 2020-10-12 NOTE — Assessment & Plan Note (Signed)
Mood d/w pt. mood is good.  No adverse effect on medication.  Compliant with sertraline.

## 2020-10-12 NOTE — Assessment & Plan Note (Signed)
  Flu up-to-date Shingles up-to-date PNA up-to-date Tetanus 2021 COVID vaccine up-to-date. Colon cancer screening 2018 Prostate cancer screening deferred.  Discussed with patient.  He agrees. Advance directive-daughter Angelique Blonder designated if patient were incapacitated.  Cognitive function addressed- see scanned forms- and if abnormal then additional documentation follows.

## 2020-10-12 NOTE — Assessment & Plan Note (Signed)
Advance directive-daughter Julie Verchick designated if patient were incapacitated.  ?

## 2020-10-12 NOTE — Assessment & Plan Note (Signed)
Discuss stopping simvastatin and changing to Crestor.  Routine cautions given to patient.  He will update me as needed.

## 2020-10-12 NOTE — Assessment & Plan Note (Signed)
A1c reasonable.  Labs discussed with patient.  Continue glimepiride Metformin and recheck periodically.  He agrees.

## 2020-10-12 NOTE — Assessment & Plan Note (Signed)
He can usually go a few months between PPI doses.  He will use the medication as needed and update me if he has more symptoms.  He agrees.

## 2020-10-12 NOTE — Assessment & Plan Note (Signed)
It sounds like he has mechanical symptoms induced by his pillow that he has been using.  He did not have symptoms when he changed pillows on vacation.  I asked him to check on this/switch pillows and update me as needed.

## 2020-11-04 ENCOUNTER — Encounter: Payer: Self-pay | Admitting: Family Medicine

## 2020-12-12 ENCOUNTER — Other Ambulatory Visit: Payer: Self-pay | Admitting: Family Medicine

## 2021-01-19 ENCOUNTER — Encounter: Payer: Self-pay | Admitting: Family Medicine

## 2021-01-21 ENCOUNTER — Encounter: Payer: Self-pay | Admitting: Family Medicine

## 2021-03-21 ENCOUNTER — Other Ambulatory Visit: Payer: Self-pay | Admitting: Family Medicine

## 2021-03-22 ENCOUNTER — Telehealth: Payer: Self-pay | Admitting: Family Medicine

## 2021-03-22 NOTE — Telephone Encounter (Signed)
Please call patient and get him scheduled for an OV.  We can do labs at the visit- I already ordered A1c and lipid.  Thanks.

## 2021-03-26 ENCOUNTER — Encounter: Payer: Self-pay | Admitting: Family Medicine

## 2021-03-26 ENCOUNTER — Other Ambulatory Visit: Payer: Self-pay

## 2021-03-26 ENCOUNTER — Ambulatory Visit (INDEPENDENT_AMBULATORY_CARE_PROVIDER_SITE_OTHER): Payer: Medicare Other | Admitting: Family Medicine

## 2021-03-26 VITALS — BP 126/64 | HR 73 | Temp 97.3°F | Ht 66.25 in | Wt 154.4 lb

## 2021-03-26 DIAGNOSIS — E119 Type 2 diabetes mellitus without complications: Secondary | ICD-10-CM | POA: Diagnosis not present

## 2021-03-26 LAB — POCT GLYCOSYLATED HEMOGLOBIN (HGB A1C): Hemoglobin A1C: 6.6 % — AB (ref 4.0–5.6)

## 2021-03-26 NOTE — Patient Instructions (Addendum)
Go to the lab on the way out.   If you have mychart we'll likely use that to update you.    Take care.  Glad to see you. 

## 2021-03-26 NOTE — Progress Notes (Signed)
This visit occurred during the SARS-CoV-2 public health emergency.  Safety protocols were in place, including screening questions prior to the visit, additional usage of staff PPE, and extensive cleaning of exam room while observing appropriate contact time as indicated for disinfecting solutions.  Diabetes:  Using medications without difficulties: glimepiride and metformin.   Hypoglycemic episodes: no Hyperglycemic episodes: no Feet problems: no tingling but some occ cramping at night.   Blood Sugars averaging: 140-170 usually, in the AM.  Has been 90-100 after meals later in the day.   eye exam within last year: yes He has noted pulse up to 90s when working in the heat.  No sx, no skipped beats.    Meds, vitals, and allergies reviewed.  ROS: Per HPI unless specifically indicated in ROS section   GEN: nad, alert and oriented HEENT: ncat NECK: supple w/o LA CV: rrr. PULM: ctab, no inc wob ABD: soft, +bs EXT: no edema SKIN: well perfused.

## 2021-03-27 LAB — CBC WITH DIFFERENTIAL/PLATELET
Basophils Absolute: 0.1 10*3/uL (ref 0.0–0.1)
Basophils Relative: 0.7 % (ref 0.0–3.0)
Eosinophils Absolute: 0.1 10*3/uL (ref 0.0–0.7)
Eosinophils Relative: 1.3 % (ref 0.0–5.0)
HCT: 40.8 % (ref 39.0–52.0)
Hemoglobin: 14.1 g/dL (ref 13.0–17.0)
Lymphocytes Relative: 19.6 % (ref 12.0–46.0)
Lymphs Abs: 1.5 10*3/uL (ref 0.7–4.0)
MCHC: 34.6 g/dL (ref 30.0–36.0)
MCV: 92.7 fl (ref 78.0–100.0)
Monocytes Absolute: 0.7 10*3/uL (ref 0.1–1.0)
Monocytes Relative: 9.3 % (ref 3.0–12.0)
Neutro Abs: 5.4 10*3/uL (ref 1.4–7.7)
Neutrophils Relative %: 69.1 % (ref 43.0–77.0)
Platelets: 175 10*3/uL (ref 150.0–400.0)
RBC: 4.39 Mil/uL (ref 4.22–5.81)
RDW: 12.6 % (ref 11.5–15.5)
WBC: 7.9 10*3/uL (ref 4.0–10.5)

## 2021-03-27 LAB — COMPREHENSIVE METABOLIC PANEL
ALT: 20 U/L (ref 0–53)
AST: 16 U/L (ref 0–37)
Albumin: 4.3 g/dL (ref 3.5–5.2)
Alkaline Phosphatase: 58 U/L (ref 39–117)
BUN: 21 mg/dL (ref 6–23)
CO2: 31 mEq/L (ref 19–32)
Calcium: 9.3 mg/dL (ref 8.4–10.5)
Chloride: 102 mEq/L (ref 96–112)
Creatinine, Ser: 1 mg/dL (ref 0.40–1.50)
GFR: 72.13 mL/min (ref >60.00)
Glucose, Bld: 209 mg/dL — ABNORMAL HIGH (ref 70–99)
Potassium: 4.3 mEq/L (ref 3.5–5.1)
Sodium: 139 mEq/L (ref 135–145)
Total Bilirubin: 0.5 mg/dL (ref 0.2–1.2)
Total Protein: 6.4 g/dL (ref 6.0–8.3)

## 2021-03-29 NOTE — Assessment & Plan Note (Signed)
Continue metformin and glimepiride.  Caution regarding hypoglycemia.  He was expecting his A1c to be higher.  It was lower than previous on check today.  Check hemoglobin and c-Met given the unexpected A1c, to make sure he is not anemic, etc.  See notes on labs.  Recheck periodically.  Continue work on diet and exercise.  He will update me as needed.

## 2021-04-08 ENCOUNTER — Encounter: Payer: Self-pay | Admitting: Family Medicine

## 2021-04-09 ENCOUNTER — Other Ambulatory Visit: Payer: Self-pay | Admitting: Family Medicine

## 2021-04-09 MED ORDER — METFORMIN HCL ER 500 MG PO TB24: ORAL_TABLET | ORAL | 3 refills | Status: DC

## 2021-04-23 ENCOUNTER — Encounter: Payer: Self-pay | Admitting: Family Medicine

## 2021-04-24 NOTE — Telephone Encounter (Signed)
Am I ok to send in new script for meter and supplies for what his insurance covers?

## 2021-04-24 NOTE — Telephone Encounter (Signed)
I think it makes sense to send the meter/supplies that are covered by his insurance.  Please send.  Thanks.

## 2021-04-27 ENCOUNTER — Other Ambulatory Visit: Payer: Self-pay

## 2021-04-27 MED ORDER — BLOOD GLUCOSE METER KIT: PACK | 0 refills | Status: DC

## 2021-04-30 MED ORDER — ONETOUCH VERIO W/DEVICE KIT: PACK | 0 refills | Status: AC

## 2021-05-11 ENCOUNTER — Other Ambulatory Visit: Payer: Self-pay

## 2021-05-11 DIAGNOSIS — E119 Type 2 diabetes mellitus without complications: Secondary | ICD-10-CM

## 2021-05-11 MED ORDER — ONETOUCH VERIO VI STRP: ORAL_STRIP | 12 refills | Status: DC

## 2021-05-20 ENCOUNTER — Encounter: Payer: Self-pay | Admitting: Family Medicine

## 2021-06-14 ENCOUNTER — Other Ambulatory Visit: Payer: Self-pay | Admitting: Family Medicine

## 2021-08-20 ENCOUNTER — Other Ambulatory Visit: Payer: Self-pay | Admitting: Family Medicine

## 2021-08-21 DIAGNOSIS — H2513 Age-related nuclear cataract, bilateral: Secondary | ICD-10-CM | POA: Diagnosis not present

## 2021-08-21 DIAGNOSIS — E113291 Type 2 diabetes mellitus with mild nonproliferative diabetic retinopathy without macular edema, right eye: Secondary | ICD-10-CM | POA: Diagnosis not present

## 2021-08-21 DIAGNOSIS — H5213 Myopia, bilateral: Secondary | ICD-10-CM | POA: Diagnosis not present

## 2021-08-21 DIAGNOSIS — H524 Presbyopia: Secondary | ICD-10-CM | POA: Diagnosis not present

## 2021-08-21 LAB — HM DIABETES EYE EXAM

## 2021-09-02 ENCOUNTER — Other Ambulatory Visit: Payer: Self-pay | Admitting: Family Medicine

## 2021-10-01 ENCOUNTER — Telehealth: Payer: Self-pay | Admitting: Family Medicine

## 2021-10-01 NOTE — Telephone Encounter (Signed)
Patient states he is out of town and has lost his metformin  He wanted to know if we could text the prescription to his phone. I asked if he could give [place] the pharmacy he would like it submitted to. He states that he didn't want to loose time while traveling by going to a pharmacy and waiting  Patient would like a return call to see if this can be done.

## 2021-10-02 NOTE — Telephone Encounter (Signed)
Mr. Berisha called in stated not to worry about it due to he doesn't want to wait and he is traveling.

## 2021-10-02 NOTE — Telephone Encounter (Signed)
LMTCB; we can not text a prescription to a patient but we can send the prescription to the pharmacy. I need to know where the rx needs to be sent to.

## 2021-11-11 ENCOUNTER — Other Ambulatory Visit: Payer: Self-pay | Admitting: Family Medicine

## 2021-11-16 ENCOUNTER — Other Ambulatory Visit: Payer: Self-pay | Admitting: Family Medicine

## 2021-12-08 ENCOUNTER — Telehealth: Payer: Self-pay | Admitting: Family Medicine

## 2021-12-08 NOTE — Telephone Encounter (Signed)
Left message for patient to call back to schedule Medicare Annual Wellness Visit  ? ?Last AWV  10/10/20 ? ? ?Any questions, please call me at [phone]  ?

## 2021-12-09 ENCOUNTER — Ambulatory Visit (INDEPENDENT_AMBULATORY_CARE_PROVIDER_SITE_OTHER): Payer: Medicare Other | Admitting: *Deleted

## 2021-12-09 DIAGNOSIS — Z0000 Encounter for general adult medical examination without abnormal findings: Secondary | ICD-10-CM | POA: Diagnosis not present

## 2021-12-09 NOTE — Patient Instructions (Signed)
Mr. Arroyave , ?Thank you for taking time to come for your Medicare Wellness Visit. I appreciate your ongoing commitment to your health goals. Please review the following plan we discussed and let me know if I can assist you in the future.  ? ?Screening recommendations/referrals: ?Colonoscopy: no longer indicated ?Recommended yearly ophthalmology/optometry visit for glaucoma screening and checkup ?Recommended yearly dental visit for hygiene and checkup ? ?Vaccinations: ?Influenza vaccine: up to date ?Pneumococcal vaccine:  ?Tdap vaccine: up to date ?Shingles vaccine: up to date   ? ?Advanced directives: on file ? ?Conditions/risks identified:  ? ?Next appointment: 12-21-2021 @ 2:30 Damita Dunnings ? ?Preventive Care 7 Years and Older, Male ?Preventive care refers to lifestyle choices and visits with your health care provider that can promote health and wellness. ?What does preventive care include? ?A yearly physical exam. This is also called an annual well check. ?Dental exams once or twice a year. ?Routine eye exams. Ask your health care provider how often you should have your eyes checked. ?Personal lifestyle choices, including: ?Daily care of your teeth and gums. ?Regular physical activity. ?Eating a healthy diet. ?Avoiding tobacco and drug use. ?Limiting alcohol use. ?Practicing safe sex. ?Taking low doses of aspirin every day. ?Taking vitamin and mineral supplements as recommended by your health care provider. ?What happens during an annual well check? ?The services and screenings done by your health care provider during your annual well check will depend on your age, overall health, lifestyle risk factors, and family history of disease. ?Counseling  ?Your health care provider may ask you questions about your: ?Alcohol use. ?Tobacco use. ?Drug use. ?Emotional well-being. ?Home and relationship well-being. ?Sexual activity. ?Eating habits. ?History of falls. ?Memory and ability to understand (cognition). ?Work and work  Statistician. ?Screening  ?You may have the following tests or measurements: ?Height, weight, and BMI. ?Blood pressure. ?Lipid and cholesterol levels. These may be checked every 5 years, or more frequently if you are over 35 years old. ?Skin check. ?Lung cancer screening. You may have this screening every year starting at age 72 if you have a 30-pack-year history of smoking and currently smoke or have quit within the past 15 years. ?Fecal occult blood test (FOBT) of the stool. You may have this test every year starting at age 9. ?Flexible sigmoidoscopy or colonoscopy. You may have a sigmoidoscopy every 5 years or a colonoscopy every 10 years starting at age 4. ?Prostate cancer screening. Recommendations will vary depending on your family history and other risks. ?Hepatitis C blood test. ?Hepatitis B blood test. ?Sexually transmitted disease (STD) testing. ?Diabetes screening. This is done by checking your blood sugar (glucose) after you have not eaten for a while (fasting). You may have this done every 1-3 years. ?Abdominal aortic aneurysm (AAA) screening. You may need this if you are a current or former smoker. ?Osteoporosis. You may be screened starting at age 21 if you are at high risk. ?Talk with your health care provider about your test results, treatment options, and if necessary, the need for more tests. ?Vaccines  ?Your health care provider may recommend certain vaccines, such as: ?Influenza vaccine. This is recommended every year. ?Tetanus, diphtheria, and acellular pertussis (Tdap, Td) vaccine. You may need a Td booster every 10 years. ?Zoster vaccine. You may need this after age 64. ?Pneumococcal 13-valent conjugate (PCV13) vaccine. One dose is recommended after age 24. ?Pneumococcal polysaccharide (PPSV23) vaccine. One dose is recommended after age 65. ?Talk to your health care provider about which screenings and vaccines you  need and how often you need them. ?This information is not intended to replace  advice given to you by your health care provider. Make sure you discuss any questions you have with your health care provider. ?Document Released: 08/29/2015 Document Revised: 04/21/2016 Document Reviewed: 06/03/2015 ?Elsevier Interactive Patient Education ? 2017 [place]. ? ?Fall Prevention in the Home ?Falls can cause injuries. They can happen to people of all ages. There are many things you can do to make your home safe and to help prevent falls. ?What can I do on the outside of my home? ?Regularly fix the edges of walkways and driveways and fix any cracks. ?Remove anything that might make you trip as you walk through a door, such as a raised step or threshold. ?Trim any bushes or trees on the path to your home. ?Use bright outdoor lighting. ?Clear any walking paths of anything that might make someone trip, such as rocks or tools. ?Regularly check to see if handrails are loose or broken. Make sure that both sides of any steps have handrails. ?Any raised decks and porches should have guardrails on the edges. ?Have any leaves, snow, or ice cleared regularly. ?Use sand or salt on walking paths during winter. ?Clean up any spills in your garage right away. This includes oil or grease spills. ?What can I do in the bathroom? ?Use night lights. ?Install grab bars by the toilet and in the tub and shower. Do not use towel bars as grab bars. ?Use non-skid mats or decals in the tub or shower. ?If you need to sit down in the shower, use a plastic, non-slip stool. ?Keep the floor dry. Clean up any water that spills on the floor as soon as it happens. ?Remove soap buildup in the tub or shower regularly. ?Attach bath mats securely with double-sided non-slip rug tape. ?Do not have throw rugs and other things on the floor that can make you trip. ?What can I do in the bedroom? ?Use night lights. ?Make sure that you have a light by your bed that is easy to reach. ?Do not use any sheets or blankets that are too big for your bed.  They should not hang down onto the floor. ?Have a firm chair that has side arms. You can use this for support while you get dressed. ?Do not have throw rugs and other things on the floor that can make you trip. ?What can I do in the kitchen? ?Clean up any spills right away. ?Avoid walking on wet floors. ?Keep items that you use a lot in easy-to-reach places. ?If you need to reach something above you, use a strong step stool that has a grab bar. ?Keep electrical cords out of the way. ?Do not use floor polish or wax that makes floors slippery. If you must use wax, use non-skid floor wax. ?Do not have throw rugs and other things on the floor that can make you trip. ?What can I do with my stairs? ?Do not leave any items on the stairs. ?Make sure that there are handrails on both sides of the stairs and use them. Fix handrails that are broken or loose. Make sure that handrails are as long as the stairways. ?Check any carpeting to make sure that it is firmly attached to the stairs. Fix any carpet that is loose or worn. ?Avoid having throw rugs at the top or bottom of the stairs. If you do have throw rugs, attach them to the floor with carpet tape. ?Make sure that  you have a light switch at the top of the stairs and the bottom of the stairs. If you do not have them, ask someone to add them for you. ?What else can I do to help prevent falls? ?Wear shoes that: ?Do not have high heels. ?Have rubber bottoms. ?Are comfortable and fit you well. ?Are closed at the toe. Do not wear sandals. ?If you use a stepladder: ?Make sure that it is fully opened. Do not climb a closed stepladder. ?Make sure that both sides of the stepladder are locked into place. ?Ask someone to hold it for you, if possible. ?Clearly mark and make sure that you can see: ?Any grab bars or handrails. ?First and last steps. ?Where the edge of each step is. ?Use tools that help you move around (mobility aids) if they are needed. These  include: ?Canes. ?Walkers. ?Scooters. ?Crutches. ?Turn on the lights when you go into a dark area. Replace any light bulbs as soon as they burn out. ?Set up your furniture so you have a clear path. Avoid moving your furniture around.

## 2021-12-09 NOTE — Progress Notes (Signed)
? ?Subjective:  ? Greg Rogers. is a 81 y.o. male who presents for Medicare Annual/Subsequent preventive examination. ? ?I connected with  Chauncy Lean. on 12/09/21 by a telephone enabled telemedicine application and verified that I am speaking with the correct person using two identifiers. ?  ?I discussed the limitations of evaluation and management by telemedicine. The patient expressed understanding and agreed to proceed. ? ?Patient location: home ? ?Provider location: tele-health not in office ? ? ? ?Review of Systems    ? ?Cardiac Risk Factors include: advanced age (>80mn, >>75women);family history of premature cardiovascular disease;male gender;diabetes mellitus ? ?   ?Objective:  ?  ?Today's Vitals  ? ?There is no height or weight on file to calculate BMI. ? ? ?  12/09/2021  ?  8:37 AM 09/23/2019  ?  9:59 PM 02/28/2018  ? 11:51 AM 02/21/2017  ?  2:05 PM 12/17/2015  ?  9:03 AM  ?Advanced Directives  ?Does Patient Have a Medical Advance Directive? Yes No Yes Yes Yes  ?Type of AParamedicof ABlue RidgeLiving will H[place]Living will [place]raw[place]Living will  ?Does patient want to make changes to medical advance directive?     No - Patient declined  ?Copy of HGibbsin Chart? Yes - validated most recent copy scanned in chart (See row information)  Yes Yes No - copy requested  ?Would patient like information on creating a medical advance directive?  No - Patient declined     ? ? ?Current Medications (verified) ?Outpatient Encounter Medications as of 12/09/2021  ?Medication Sig  ? Accu-Chek FastClix Lancets MISC Use as needed to check sugar daily.  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  ? aspirin 81 MG EC tablet Take 81 mg by mouth daily.   ? Blood Glucose Monitoring Suppl (ONETOUCH VERIO) w/Device KIT Use to check blood sugar up to 3 times a day. Dx: E11.9  ? EPINEPHrine 0.3 mg/0.3 mL IJ SOAJ  injection Inject 0.3 mLs (0.3 mg total) into the muscle once. (Patient taking differently: Inject 0.3 mg into the muscle as needed.)  ? glimepiride (AMARYL) 2 MG tablet TAKE 1 TABLET BY MOUTH  DAILY WITH BREAKFAST  ? glucose blood (ONETOUCH VERIO) test strip Use as instructed  ? metFORMIN (GLUCOPHAGE XR) 500 MG 24 hr tablet Start with 1 tablet daily and gradually increase to a total of 4 tablets/day if tolerated.  ? nitroGLYCERIN (NITROSTAT) 0.4 MG SL tablet Place 1 tablet (0.4 mg total) under the tongue every 5 (five) minutes as needed for chest pain (max 3 doses/15 min.call MD/to ER if used).  ? rosuvastatin (CRESTOR) 10 MG tablet TAKE 1 TABLET BY MOUTH  DAILY  ? sertraline (ZOLOFT) 50 MG tablet TAKE 1 TABLET BY MOUTH  DAILY  ? omeprazole (PRILOSEC) 20 MG capsule Take 20 mg by mouth daily as needed. As needed (Patient not taking: Reported on 12/09/2021)  ? ?No facility-administered encounter medications on file as of 12/09/2021.  ? ? ?Allergies (verified) ?Azo [phenazopyridine], Bee venom, Erythromycin, Fire ant, Pneumococcal vaccine polyvalent, and Wasp venom  ? ?History: ?Past Medical History:  ?Diagnosis Date  ? Abnormal GGT test   ? With otherwise normal work up.  ? Arthritis   ? [place]   ? Foot pain   ? Bilateral  ? Hyperglycemia   ? Hyperlipidemia   ? Nephrolithiasis   ? 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 Persistent disorder of initiating or maintaining sleep   ? Pes planus   ? Rosacea   ? Snoring   ? ?Past Surgical History:  ?Procedure Laterality Date  ? APPENDECTOMY  1949  ? at age 49  ? CERVICAL FUSION  03/2016  ? C 3&4  ? Cyst removed from breast  1960  ? FOOT SURGERY  01/2009  ? Left  ? HERNIA REPAIR  1956  ? at age 3 (single)  ? HERNIA REPAIR  1960  ? (double)  ? Left 2nd toe removed  03/2017  ? TONSILLECTOMY AND ADENOIDECTOMY  ~ 1950  ? VASECTOMY  ~ 1984  ? ?Family History  ?Problem Relation Age of Onset  ? Heart disease Father 87  ?     First MI   ? Hyperlipidemia Father   ? Heart attack Father   ? Diabetes Brother   ? Hyperlipidemia Brother   ? Hypertension Brother   ? Heart disease Brother   ?     CAD s/p stent  ? CAD Brother   ? Heart disease Brother   ? Heart disease Brother   ? Diabetes Sister   ?     DM2  ? Hyperlipidemia Sister   ? Arthritis Other   ? Diabetes Other   ? Prostate cancer Neg Hx   ? Colon cancer Neg Hx   ? ?Social History  ? ?Socioeconomic History  ? Marital status: Married  ?  Spouse name: Not on file  ? Number of children: 4  ? Years of education: Not on file  ? Highest education level: Not on file  ?Occupational History  ? Occupation: Retired Nov. 2007 27-1/2 years of service  ?  Employer: RETIRED  ?  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 Occupation: Works part time at [employer] job.  ?Tobacco Use  ? Smoking status: Never  ? Smokeless tobacco: Former  ?  Types: Chew  ?Vaping Use  ? Vaping Use: Never used  ?Substance and Sexual Activity  ? Alcohol use: Yes  ?  Alcohol/week: 13.0 - 20.0 standard drinks  ?  Types: 7 - 14 Glasses of wine, 6 Standard drinks or equivalent per week  ?  Comment: 1-2 glasses of wine at night  ? Drug use: No  ? Sexual activity: Never  ?Other Topics Concern  ? Not on file  ?Social History Narrative  ? Education:  Masters  ? 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 4 kids, 2 are MD's  ? Gardens and stays active, enjoys skiing, very active, walks at least 1/2 mile daily  ? ?Social Determinants of Health  ? ?Financial Resource Strain: Low Risk   ? Difficulty of Paying Living Expenses: Not hard at all  ?Food Insecurity: No Food Insecurity  ? Worried About Charity fundraiser in the Last Year: Never true  ? Ran Out of Food in the Last Year: Never true  ?Transportation Needs: No Transportation Needs  ? Lack of Transportation (Medical): No  ? Lack of Transportation (Non-Medical): No  ?Physical Activity: Insufficiently Active  ? Days of Exercise per Week: 2 days  ? Minutes of  Exercise per Session: 40 min  ?Stress: No Stress Concern Present  ? Feeling of Stress : Not at all  ?Social Connections: Moderately Integrated  ? Frequency of Communication with Friends and Family: More than three times a week  ? Frequency of Social  Gatherings with Friends and Family: Never  ? Attends Religious Services: Never  ? Active Member of Clubs or Organizations: Yes  ? Attends Archivist Meetings: More than 4 times per year  ? Marital Status: Living with partner  ? ? ?Tobacco Counseling ?Counseling given: Not Answered ? ? ?Clinical Intake: ? ?Pre-visit preparation completed: Yes ? ?Pain : No/denies pain ? ?  ? ?Nutritional Risks: None ?Diabetes: Yes ?CBG done?: No ?Did pt. bring in CBG monitor from home?: No ? ?How often do you need to have someone help you when you read instructions, pamphlets, or other written materials from your doctor or pharmacy?: 1 - Never ? ?Diabetic?  Yes ? ?Nutrition Risk Assessment: ? ?Has the patient had any N/V/D within the last 2 months?  No  ?Does the patient have any non-healing wounds?  No  ?Has the patient had any unintentional weight loss or weight gain?  No  ? ?Diabetes: ? ?Is the patient diabetic?  Yes  ?If diabetic, was a CBG obtained today?  No  ?Did the patient bring in their glucometer from home?  No  ?How often do you monitor your CBG's? 1 every couple weeks.  ? ?Financial Strains and Diabetes Management: ? ?Are you having any financial strains with the device, your supplies or your medication? No .  ?Does the patient want to be seen by Chronic Care Management for management of their diabetes?  No  ?Would the patient like to be referred to a Nutritionist or for Diabetic Management?  No  ? ?Diabetic Exams: ? ?Diabetic Eye Exam.  Pt has been advised about the importance in completing this exam.  ? ?Diabetic Foot Exam: . Pt has been advised about the importance in completing this exam.  ? ?Interpreter Needed?: No ? ?Information entered by :: Leroy Kennedy  LPN ? ? ?Activities of Daily Living ? ?  12/09/2021  ?  8:43 AM 03/26/2021  ?  3:25 PM  ?In your present state of health, do you have any difficulty performing the following activities:  ?Hearing? 0 0  ?Vision? 0 0  ?Dif

## 2021-12-10 ENCOUNTER — Encounter: Payer: Self-pay | Admitting: Family Medicine

## 2021-12-10 ENCOUNTER — Ambulatory Visit (INDEPENDENT_AMBULATORY_CARE_PROVIDER_SITE_OTHER): Payer: Medicare Other | Admitting: Family Medicine

## 2021-12-10 VITALS — BP 122/64 | HR 64 | Temp 97.7°F | Ht 66.25 in | Wt 157.0 lb

## 2021-12-10 DIAGNOSIS — E119 Type 2 diabetes mellitus without complications: Secondary | ICD-10-CM

## 2021-12-10 DIAGNOSIS — E785 Hyperlipidemia, unspecified: Secondary | ICD-10-CM

## 2021-12-10 DIAGNOSIS — Z7189 Other specified counseling: Secondary | ICD-10-CM

## 2021-12-10 DIAGNOSIS — Z0000 Encounter for general adult medical examination without abnormal findings: Secondary | ICD-10-CM

## 2021-12-10 DIAGNOSIS — R454 Irritability and anger: Secondary | ICD-10-CM | POA: Diagnosis not present

## 2021-12-10 DIAGNOSIS — M21619 Bunion of unspecified foot: Secondary | ICD-10-CM

## 2021-12-10 LAB — LDL CHOLESTEROL, DIRECT: Direct LDL: 54 mg/dL

## 2021-12-10 LAB — MICROALBUMIN / CREATININE URINE RATIO
Creatinine,U: 48.7 mg/dL
Microalb Creat Ratio: 1.4 mg/g (ref 0.0–30.0)
Microalb, Ur: <0.7 mg/dL (ref 0.0–1.9)

## 2021-12-10 LAB — LIPID PANEL
Cholesterol: 131 mg/dL (ref 0–200)
HDL: 47.6 mg/dL (ref >39.00)
NonHDL: 83.11
Total CHOL/HDL Ratio: 3
Triglycerides: 297 mg/dL — ABNORMAL HIGH (ref 0.0–149.0)
VLDL: 59.4 mg/dL — ABNORMAL HIGH (ref 0.0–40.0)

## 2021-12-10 LAB — HEMOGLOBIN A1C: Hgb A1c MFr Bld: 7.9 % — ABNORMAL HIGH (ref 4.6–6.5)

## 2021-12-10 NOTE — Patient Instructions (Signed)
We'll call about podiatry.  ?Go to the lab on the way out.   If you have mychart we'll likely use that to update you.    ?Take care.  Glad to see you. ?

## 2021-12-10 NOTE — Progress Notes (Signed)
He is still skiing, went out [place] this winter.  He was able to tolerate that, at elevation.   He has plans to ski at [place] when he turns 82.   ? ?Diabetes:  ?Using medications without difficulties: yes ?Hypoglycemic episodes: no ?Hyperglycemic episodes: no ?Feet problems: see exam.  Bunion on L foot.  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  Callous L foot, plantar side.  D/w pt about podiatry f/u.  ?Blood Sugars averaging: usually 170s.  Checked occ.   ?eye exam within last year: yes ?Taking 4 metformin a day.  Tolerating that.  He noted inc flatulence but it is tolerable.   ? ?Elevated Cholesterol: ?Using medications without problems:yes ?Muscle aches: likely not from statin.   ?Diet compliance: yes ?Exercise: yes ?Some occ cramping but only after sig exertion.  Not in the midst of exertion.   ? ?Mood d/w pt.  Still on sertraline.  No ADE on med.  Discussed continuing as is.  He did not tolerate taper previously. ? ?Tdap 2021 ?PNA prev done.   ?Flu 2022 ?Covid prev done  ?Shingles prev done.   ?Colonoscopy 2018.   ?Prostate cancer screening and PSA options (with potential risks and benefits of testing vs not testing) were discussed along with recent recs/guidelines.  He declined testing PSA at this point. ?Advance directive-daughter Angelique Blonder designated if patient were incapacitated.  ?HCV screening prev done at [facility] in the early 2000s.   ? ?PMH and SH reviewed ? ?Meds, vitals, and allergies reviewed.  ? ?ROS: Per HPI unless specifically indicated in ROS section  ? ?GEN: nad, alert and oriented ?HEENT: ncat ?NECK: supple w/o LA ?CV: rrr. ?PULM: ctab, no inc wob ?ABD: soft, +bs ?EXT: no edema ?SKIN: no acute rash ? 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 ?No skin breakdown ?Plantar callus noted on the left foot.  No ulceration. ?Normal DP pulses ?Normal sensation to light touch but dec to  monofilament ?Nails normal ?

## 2021-12-13 NOTE — Assessment & Plan Note (Signed)
Continue Crestor.  If he has increasing aches he can let me know. ?

## 2021-12-13 NOTE — Assessment & Plan Note (Signed)
Tdap 2021 ?PNA prev done.   ?Flu 2022 ?Covid prev done  ?Shingles prev done.   ?Colonoscopy 2018.   ?Prostate cancer screening and PSA options (with potential risks and benefits of testing vs not testing) were discussed along with recent recs/guidelines.  He declined testing PSA at this point. ?Advance directive-daughter Angelique Blonder designated if patient were incapacitated.  ?HCV screening prev done at [facility] in the early 2000s.   ?

## 2021-12-13 NOTE — Assessment & Plan Note (Signed)
Advance directive-daughter Angelique Blonder designated if patient were incapacitated.  ?

## 2021-12-13 NOTE — Assessment & Plan Note (Signed)
Taking 4 metformin a day.  Tolerating that.  He noted inc flatulence but it is tolerable.  Continue metformin and glimepiride.  Recheck periodically.  Discussed about podiatry follow-up.  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  I appreciate podiatry input. ?

## 2021-12-13 NOTE — Assessment & Plan Note (Signed)
Continue sertraline.  He will update me as needed. ?

## 2021-12-15 ENCOUNTER — Encounter: Payer: Self-pay | Admitting: Family Medicine

## 2021-12-17 ENCOUNTER — Ambulatory Visit: Payer: Medicare Other

## 2021-12-17 ENCOUNTER — Ambulatory Visit (INDEPENDENT_AMBULATORY_CARE_PROVIDER_SITE_OTHER): Payer: Medicare Other | Admitting: Podiatry

## 2021-12-17 DIAGNOSIS — Z8639 Personal history of other endocrine, nutritional and metabolic disease: Secondary | ICD-10-CM | POA: Diagnosis not present

## 2021-12-17 DIAGNOSIS — M21619 Bunion of unspecified foot: Secondary | ICD-10-CM

## 2021-12-17 DIAGNOSIS — L84 Corns and callosities: Secondary | ICD-10-CM

## 2021-12-17 DIAGNOSIS — M216X2 Other acquired deformities of left foot: Secondary | ICD-10-CM

## 2021-12-17 DIAGNOSIS — E119 Type 2 diabetes mellitus without complications: Secondary | ICD-10-CM

## 2021-12-17 NOTE — Patient Instructions (Signed)
Diabetes Mellitus and Foot Care Foot care is an important part of your health, especially when you have diabetes. Diabetes may cause you to have problems because of poor blood flow (circulation) to your feet and legs, which can cause your skin to: Become thinner and drier. Break more easily. Heal more slowly. Peel and crack. You may also have nerve damage (neuropathy) in your legs and feet, causing decreased feeling in them. This means that you may not notice minor injuries to your feet that could lead to more serious problems. Noticing and addressing any potential problems early is the best way to prevent future foot problems. How to care for your feet Foot hygiene  Wash your feet daily with warm water and mild soap. Do not use hot water. Then, pat your feet and the areas between your toes until they are completely dry. Do not soak your feet as this can dry your skin. Trim your toenails straight across. Do not dig under them or around the cuticle. File the edges of your nails with an emery board or nail file. Apply a moisturizing lotion or petroleum jelly to the skin on your feet and to dry, brittle toenails. Use lotion that does not contain alcohol and is unscented. Do not apply lotion between your toes. Shoes and socks Wear clean socks or stockings every day. Make sure they are not too tight. Do not wear knee-high stockings since they may decrease blood flow to your legs. Wear shoes that fit properly and have enough cushioning. Always look in your shoes before you put them on to be sure there are no objects inside. To break in new shoes, wear them for just a few hours a day. This prevents injuries on your feet. Wounds, scrapes, corns, and calluses  Check your feet daily for blisters, cuts, bruises, sores, and redness. If you cannot see the bottom of your feet, use a mirror or ask someone for help. Do not cut corns or calluses or try to remove them with medicine. If you find a minor scrape,  cut, or break in the skin on your feet, keep it and the skin around it clean and dry. You may clean these areas with mild soap and water. Do not clean the area with peroxide, alcohol, or iodine. If you have a wound, scrape, corn, or callus on your foot, look at it several times a day to make sure it is healing and not infected. Check for: Redness, swelling, or pain. Fluid or blood. Warmth. Pus or a bad smell. General tips Do not cross your legs. This may decrease blood flow to your feet. Do not use heating pads or hot water bottles on your feet. They may burn your skin. If you have lost feeling in your feet or legs, you may not know this is happening until it is too late. Protect your feet from hot and cold by wearing shoes, such as at the beach or on hot pavement. Schedule a complete foot exam at least once a year (annually) or more often if you have foot problems. Report any cuts, sores, or bruises to your health care provider immediately. Where to find more information American Diabetes Association: www.diabetes.org Association of Diabetes Care & Education Specialists: www.diabeteseducator.org Contact a health care provider if: You have a medical condition that increases your risk of infection and you have any cuts, sores, or bruises on your feet. You have an injury that is not healing. You have redness on your legs or feet. You   feel burning or tingling in your legs or feet. You have pain or cramps in your legs and feet. Your legs or feet are numb. Your feet always feel cold. You have pain around any toenails. Get help right away if: You have a wound, scrape, corn, or callus on your foot and: You have pain, swelling, or redness that gets worse. You have fluid or blood coming from the wound, scrape, corn, or callus. Your wound, scrape, corn, or callus feels warm to the touch. You have pus or a bad smell coming from the wound, scrape, corn, or callus. You have a fever. You have a red  line going up your leg. Summary Check your feet every day for blisters, cuts, bruises, sores, and redness. Apply a moisturizing lotion or petroleum jelly to the skin on your feet and to dry, brittle toenails. Wear shoes that fit properly and have enough cushioning. If you have foot problems, report any cuts, sores, or bruises to your health care provider immediately. Schedule a complete foot exam at least once a year (annually) or more often if you have foot problems. This information is not intended to replace advice given to you by your health care provider. Make sure you discuss any questions you have with your health care provider. Document Revised: 02/21/2020 Document Reviewed: 02/21/2020 Elsevier Patient Education  2023 Elsevier Inc.  

## 2021-12-17 NOTE — Progress Notes (Signed)
SITUATION ?Reason for Consult: Evaluation for Prefabricated Diabetic Shoes and Custom Diabetic Inserts. ?Patient / Caregiver Report: Patient would like well fitting shoes ? ?OBJECTIVE DATA: ?Patient History / Diagnosis:  ?  ICD-10-CM   ?1. Diabetes mellitus without complication (HCC)  R71.1   ?  ?2. Bunion  M21.619   ?  ?3. Callus  L84   ?  ? ? ?Physician Treating Diabetes:  Tonia Ghent ? ?Current or Previous Devices:   None and no history ? ?In-Person Foot Examination: ?Ulcers & Callousing:   Callusing on met heads ?Deformities:    Hallux valgus ?Sensation:    Intact  ?Shoe Size:     10W ? ?ORTHOTIC RECOMMENDATION ?Recommended Devices: ?- 1x pair prefabricated PDAC approved diabetic shoes; Patient Selected Orthofeet pacific pallisades black Size 10W ?- 3x pair custom-to-patient PDAC approved vacuum formed diabetic insoles. ? ?GOALS OF SHOES AND INSOLES ?- Reduce shear and pressure ?- Reduce / Prevent callus formation ?- Reduce / Prevent ulceration ?- Protect the fragile healing compromised diabetic foot. ? ?Patient would benefit from diabetic shoes and inserts as patient has diabetes mellitus and the patient has one or more of the following conditions: ?- History of partial or complete amputation of the foot ?- History of previous foot ulceration. ?- History of pre-ulcerative callus ?- Peripheral neuropathy with evidence of callus formation ?- Foot deformity ?- Poor circulation ? ?ACTIONS PERFORMED ?Potential out of pocket cost was communicated to patient. Patient understood and consented to measurement and casting. Patient was casted for insoles via crush box and measured for shoes via brannock device. Procedure was explained and patient tolerated procedure well. All questions were answered and concerns addressed. Casts were shipped to central fabrication for HOLD until Certificate of Medical Necessity or otherwise necessary authorization from insurance is obtained. ? ?PLAN ?Shoes are to be ordered and casts  released from hold once all appropriate paperwork is complete. Patient is to be contacted and scheduled for fitting once shoes and insoles have been fabricated and received. ? ?

## 2021-12-17 NOTE — Progress Notes (Signed)
Subjective:  ? ?Patient ID: Chauncy Lean., male   DOB: 81 y.o.   MRN: [id number]  ? ?HPI ?81 year old male presents the office today for diabetic foot exam as well as for painful callus on his left foot.  This started about 3 years ago and has not had any recent treatment.  He gets pain after being on his feet.  He does put a topical cream on top of his foot which actually helps the pain on the bottom.  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  He has numbness or tingling.  No claudication symptoms.  No other concerns. ? ?A1c 7.9 on 12/10/2021 ? ?Review of Systems  ?All other systems reviewed and are negative. ? ?Past Medical History:  ?Diagnosis Date  ? Abnormal GGT test   ? With otherwise normal work up.  ? Arthritis   ? Diabetes mellitus without complication ([place])   ? Foot pain   ? Bilateral  ? Hyperglycemia   ? Hyperlipidemia   ? Nephrolithiasis   ? 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 Persistent disorder of initiating or maintaining sleep   ? Pes planus   ? Rosacea   ? Snoring   ? ? ?Past Surgical History:  ?Procedure Laterality Date  ? APPENDECTOMY  1949  ? at age 77  ? CERVICAL FUSION  03/2016  ? C 3&4  ? Cyst removed from breast  1960  ? FOOT SURGERY  01/2009  ? Left  ? HERNIA REPAIR  1956  ? at age 33 (single)  ? HERNIA REPAIR  1960  ? (double)  ? Left 2nd toe removed  03/2017  ? TONSILLECTOMY AND ADENOIDECTOMY  ~ 1950  ? VASECTOMY  ~ 1984  ? ? ? ?Current Outpatient Medications:  ?  Accu-Chek FastClix Lancets MISC, Use as needed to check sugar daily.  Diagnosis:  E11.9  Non insulin dependent., Disp: 100 each, Rfl: 3 ?  aspirin 81 MG EC tablet, Take 81 mg by mouth daily. , Disp: , Rfl:  ?  Blood Glucose Monitoring Suppl (ONETOUCH VERIO) w/Device KIT, Use to check blood sugar up to 3 times a day. Dx: E11.9, Disp: 1 kit, Rfl: 0 ?  EPINEPHrine 0.3 mg/0.3 mL IJ SOAJ injection, Inject 0.3 mLs (0.3 mg total) into the  muscle once. (Patient taking differently: Inject 0.3 mg into the muscle as needed.), Disp: 2 Device, Rfl: 1 ?  glimepiride (AMARYL) 2 MG tablet, TAKE 1 TABLET BY MOUTH  DAILY WITH BREAKFAST, Disp: 90 tablet, Rfl: 3 ?  glucose blood (ONETOUCH VERIO) test strip, Use as instructed, Disp: 200 each, Rfl: 12 ?  metFORMIN (GLUCOPHAGE XR) 500 MG 24 hr tablet, Start with 1 tablet daily and gradually increase to a total of 4 tablets/day if tolerated., Disp: 360 tablet, Rfl: 3 ?  nitroGLYCERIN (NITROSTAT) 0.4 MG SL tablet, Place 1 tablet (0.4 mg total) under the tongue every 5 (five) minutes as needed for chest pain (max 3 doses/15 min.call MD/to ER if used)., Disp: 25 tablet, Rfl: 3 ?  omeprazole (PRILOSEC) 20 MG capsule, Take 20 mg by mouth daily as needed. As needed, Disp: , Rfl:  ?  rosuvastatin (CRESTOR) 10 MG tablet, TAKE 1 TABLET BY MOUTH  DAILY, Disp: 90 tablet, Rfl: 3 ?  sertraline (ZOLOFT) 50 MG tablet, TAKE 1 TABLET BY MOUTH  DAILY, Disp: 90 tablet, Rfl: 3 ? ?Allergies  ?Allergen Reactions  ?  Azo [Phenazopyridine] Other (See Comments)  ?  diarrhea  ? Bee Venom   ? Erythromycin   ?  Oral ulcers (but tolerates zithromax) ?No problem with erythromycin but does not tolerate the "old mycin drugs"  ? Fire [employer]   ?  Local reaction, irritation.   ? Pneumococcal Vaccine Polyvalent   ?  REACTION: rash  ? Wasp Venom   ?  Exaggerated local reaction  ? ? ? ? ?   ?Objective:  ?Physical Exam  ?General: AAO x3, NAD ? ?Dermatological: Thick hyperkeratotic lesion left submetatarsal 2 without any underlying ulceration drainage or signs of infection but there is no open lesions noted today. ? ?Vascular: Dorsalis Pedis artery and Posterior Tibial artery pedal pulses are 2/4 bilateral with immedate capillary fill time.  There is no pain with calf compression, swelling, warmth, erythema.  ? ?Neruologic: Grossly intact via light touch bilateral.  ? ?Musculoskeletal: Bunions present without significant pain.  There is prominent metatarsal  head plantarly with atrophy the fat pad resulting in hyperkeratotic lesion.  Muscular strength 5/5 in all groups tested bilateral. ? ?Gait: Unassisted, Nonantalgic.  ? ? ?   ?Assessment:  ? ?Hyperkeratotic lesion, prominent metatarsal head left foot ? ?   ?Plan:  ?-Treatment options discussed including all alternatives, risks, and complications ?-Etiology of symptoms were discussed ?-Sharply debrided the hyperkeratotic lesion without any complications or bleeding.  Discussed moisturizer and offloading.  Discussed inserts to help offload.  I Georgina Peer have him follow-up with Aaron Edelman, orthotist for inserts. ?-Daily foot inspection, glucose control ? ?Trula Slade DPM ? ?   ? ?

## 2021-12-21 ENCOUNTER — Encounter: Payer: Medicare Other | Admitting: Family Medicine

## 2021-12-22 ENCOUNTER — Ambulatory Visit: Payer: Medicare Other | Admitting: Podiatry

## 2022-01-01 ENCOUNTER — Telehealth: Payer: Self-pay

## 2022-01-01 NOTE — Telephone Encounter (Signed)
CMN Received - Casts released from hold and shoes ordered  [place] black 10W

## 2022-01-22 ENCOUNTER — Other Ambulatory Visit: Payer: BLUE CROSS/BLUE SHIELD

## 2022-01-25 ENCOUNTER — Ambulatory Visit (INDEPENDENT_AMBULATORY_CARE_PROVIDER_SITE_OTHER): Payer: Medicare Other

## 2022-01-25 DIAGNOSIS — M21612 Bunion of left foot: Secondary | ICD-10-CM | POA: Diagnosis not present

## 2022-01-25 DIAGNOSIS — M2012 Hallux valgus (acquired), left foot: Secondary | ICD-10-CM | POA: Diagnosis not present

## 2022-01-25 DIAGNOSIS — M21619 Bunion of unspecified foot: Secondary | ICD-10-CM

## 2022-01-25 DIAGNOSIS — M2011 Hallux valgus (acquired), right foot: Secondary | ICD-10-CM | POA: Diagnosis not present

## 2022-01-25 DIAGNOSIS — E119 Type 2 diabetes mellitus without complications: Secondary | ICD-10-CM

## 2022-01-25 DIAGNOSIS — M21611 Bunion of right foot: Secondary | ICD-10-CM

## 2022-01-25 NOTE — Progress Notes (Signed)
SITUATION Reason for Visit: Fitting of Diabetic Shoes & Insoles Patient / Caregiver Report:  Patient is satisfied with fit and function of shoes and insoles.  OBJECTIVE DATA: Patient History / Diagnosis:     ICD-10-CM   1. Diabetes mellitus without complication (HCC)  Z61.0     2. Bunion  M21.619       Change in Status:   None  ACTIONS PERFORMED: In-Person Delivery, patient was fit with: - 1x pair A5500 PDAC approved prefabricated Diabetic Shoes: [employer] [place] 63M - 3x pair [phone] PDAC approved vacuum formed custom diabetic insoles; [employer]: [id number]  Shoes and insoles were verified for structural integrity and safety. Patient wore shoes and insoles in office. Skin was inspected and free of areas of concern after wearing shoes and inserts. Shoes and inserts fit properly. Patient / Caregiver provided with ferbal instruction and demonstration regarding donning, doffing, wear, care, proper fit, function, purpose, cleaning, and use of shoes and insoles ' and in all related precautions and risks and benefits regarding shoes and insoles. Patient / Caregiver was instructed to wear properly fitting socks with shoes at all times. Patient was also provided with verbal instruction regarding how to report any failures or malfunctions of shoes or inserts, and necessary follow up care. Patient / Caregiver was also instructed to contact physician regarding change in status that may affect function of shoes and inserts.   Patient / Caregiver verbalized undersatnding of instruction provided. Patient / Caregiver demonstrated independence with proper donning and doffing of shoes and inserts.  PLAN Patient to follow with treating physician as recommended. Plan of care was discussed with and agreed upon by patient and/or caregiver. All questions were answered and concerns addressed.

## 2022-02-04 DIAGNOSIS — W908XXS Exposure to other nonionizing radiation, sequela: Secondary | ICD-10-CM | POA: Diagnosis not present

## 2022-02-04 DIAGNOSIS — L821 Other seborrheic keratosis: Secondary | ICD-10-CM | POA: Diagnosis not present

## 2022-02-04 DIAGNOSIS — X32XXXS Exposure to sunlight, sequela: Secondary | ICD-10-CM | POA: Diagnosis not present

## 2022-02-04 DIAGNOSIS — D1801 Hemangioma of skin and subcutaneous tissue: Secondary | ICD-10-CM | POA: Diagnosis not present

## 2022-02-04 DIAGNOSIS — L578 Other skin changes due to chronic exposure to nonionizing radiation: Secondary | ICD-10-CM | POA: Diagnosis not present

## 2022-02-04 DIAGNOSIS — L570 Actinic keratosis: Secondary | ICD-10-CM | POA: Diagnosis not present

## 2022-02-04 DIAGNOSIS — L814 Other melanin hyperpigmentation: Secondary | ICD-10-CM | POA: Diagnosis not present

## 2022-02-19 ENCOUNTER — Encounter: Payer: Self-pay | Admitting: Family Medicine

## 2022-02-19 NOTE — Telephone Encounter (Signed)
I looked in chart but did not see when patient needs f/u

## 2022-05-11 ENCOUNTER — Encounter: Payer: Self-pay | Admitting: Family Medicine

## 2022-05-13 ENCOUNTER — Other Ambulatory Visit: Payer: Self-pay | Admitting: Family Medicine

## 2022-05-17 ENCOUNTER — Encounter: Payer: Self-pay | Admitting: Family Medicine

## 2022-07-24 ENCOUNTER — Other Ambulatory Visit: Payer: Self-pay | Admitting: Family Medicine

## 2022-08-06 ENCOUNTER — Other Ambulatory Visit: Payer: Self-pay | Admitting: Family Medicine

## 2022-08-25 ENCOUNTER — Telehealth: Payer: Medicare Other | Admitting: Physician Assistant

## 2022-08-25 DIAGNOSIS — J069 Acute upper respiratory infection, unspecified: Secondary | ICD-10-CM | POA: Diagnosis not present

## 2022-08-25 MED ORDER — BENZONATATE 100 MG PO CAPS: 100.0000 mg | ORAL_CAPSULE | Freq: Three times a day (TID) | ORAL | 0 refills | Status: DC | PRN

## 2022-08-25 MED ORDER — FLUTICASONE PROPIONATE 50 MCG/ACT NA SUSP
2.0000 | Freq: Every day | NASAL | 0 refills | Status: DC
Start: 2022-08-25 — End: 2022-11-30

## 2022-08-25 NOTE — Progress Notes (Signed)
E-Visit for Upper Respiratory Infection   We are sorry you are not feeling well.  Here is how we plan to help!  Based on what you have shared with me, it looks like you may have a viral upper respiratory infection.  Upper respiratory infections are caused by a large number of viruses; however, rhinovirus is the most common cause.   Symptoms vary from person to person, with common symptoms including sore throat, cough, fatigue or lack of energy and feeling of general discomfort.  A low-grade fever of up to 100.4 may present, but is often uncommon.  Symptoms vary however, and are closely related to a person's age or underlying illnesses.  The most common symptoms associated with an upper respiratory infection are nasal discharge or congestion, cough, sneezing, headache and pressure in the ears and face.  These symptoms usually persist for about 3 to 10 days, but can last up to 2 weeks.  It is important to know that upper respiratory infections do not cause serious illness or complications in most cases.    Upper respiratory infections can be transmitted from person to person, with the most common method of transmission being a person's hands.  The virus is able to live on the skin and can infect other persons for up to 2 hours after direct contact.  Also, these can be transmitted when someone coughs or sneezes; thus, it is important to cover the mouth to reduce this risk.  To keep the spread of the illness at bay, good hand hygiene is very important.  This is an infection that is most likely caused by a virus. There are no specific treatments other than to help you with the symptoms until the infection runs its course.  We are sorry you are not feeling well.  Here is how we plan to help!   For nasal congestion, you may use an oral decongestants such as Mucinex D or if you have glaucoma or high blood pressure use plain Mucinex.  Saline nasal spray or nasal drops can help and can safely be used as often as  needed for congestion.  For your congestion, I have prescribed Fluticasone nasal spray one spray in each nostril twice a day  If you do not have a history of heart disease, hypertension, diabetes or thyroid disease, prostate/bladder issues or glaucoma, you may also use Sudafed to treat nasal congestion.  It is highly recommended that you consult with a pharmacist or your primary care physician to ensure this medication is safe for you to take.     If you have a cough, you may use cough suppressants such as Delsym and Robitussin.  If you have glaucoma or high blood pressure, you can also use Coricidin HBP.   For cough I have prescribed for you A prescription cough medication called Tessalon Perles 100 mg. You may take 1-2 capsules every 8 hours as needed for cough  If you have a sore or scratchy throat, use a saltwater gargle-  to  teaspoon of salt dissolved in a 4-ounce to 8-ounce glass of warm water.  Gargle the solution for approximately 15-30 seconds and then spit.  It is important not to swallow the solution.  You can also use throat lozenges/cough drops and Chloraseptic spray to help with throat pain or discomfort.  Warm or cold liquids can also be helpful in relieving throat pain.  For headache, pain or general discomfort, you can use Ibuprofen or Tylenol as directed.   Some authorities believe   that zinc sprays or the use of Echinacea may shorten the course of your symptoms.   HOME CARE Only take medications as instructed by your medical team. Be sure to drink plenty of fluids. Water is fine as well as fruit juices, sodas and electrolyte beverages. You may want to stay away from caffeine or alcohol. If you are nauseated, try taking small sips of liquids. How do you know if you are getting enough fluid? Your urine should be a pale yellow or almost colorless. Get rest. Taking a steamy shower or using a humidifier may help nasal congestion and ease sore throat pain. You can place a towel over  your head and breathe in the steam from hot water coming from a faucet. Using a saline nasal spray works much the same way. Cough drops, hard candies and sore throat lozenges may ease your cough. Avoid close contacts especially the very young and the elderly Cover your mouth if you cough or sneeze Always remember to wash your hands.   GET HELP RIGHT AWAY IF: You develop worsening fever. If your symptoms do not improve within 10 days You develop yellow or green discharge from your nose over 3 days. You have coughing fits You develop a severe head ache or visual changes. You develop shortness of breath, difficulty breathing or start having chest pain Your symptoms persist after you have completed your treatment plan  MAKE SURE YOU  Understand these instructions. Will watch your condition. Will get help right away if you are not doing well or get worse.  Thank you for choosing an e-visit.  Your e-visit answers were reviewed by a board certified advanced clinical practitioner to complete your personal care plan. Depending upon the condition, your plan could have included both over the counter or prescription medications.  Please review your pharmacy choice. Make sure the pharmacy is open so you can pick up prescription now. If there is a problem, you may contact your provider through MyChart messaging and have the prescription routed to another pharmacy.  Your safety is important to us. If you have drug allergies check your prescription carefully.   For the next 24 hours you can use MyChart to ask questions about today's visit, request a non-urgent call back, or ask for a work or school excuse. You will get an email in the next two days asking about your experience. I hope that your e-visit has been valuable and will speed your recovery.  I have spent 5 minutes in review of e-visit questionnaire, review and updating patient chart, medical decision making and response to patient.    Arla Boutwell M Deone Omahoney, PA-C  

## 2022-09-10 DIAGNOSIS — H524 Presbyopia: Secondary | ICD-10-CM | POA: Diagnosis not present

## 2022-09-10 DIAGNOSIS — H2513 Age-related nuclear cataract, bilateral: Secondary | ICD-10-CM | POA: Diagnosis not present

## 2022-09-10 DIAGNOSIS — E119 Type 2 diabetes mellitus without complications: Secondary | ICD-10-CM | POA: Diagnosis not present

## 2022-09-10 LAB — HM DIABETES EYE EXAM

## 2022-10-08 ENCOUNTER — Other Ambulatory Visit: Payer: Self-pay | Admitting: Family Medicine

## 2022-10-11 NOTE — Telephone Encounter (Signed)
Please call and schedule AWV for april

## 2022-10-13 NOTE — Telephone Encounter (Signed)
Cpe scheduled for 11/30/22

## 2022-10-18 DIAGNOSIS — H43811 Vitreous degeneration, right eye: Secondary | ICD-10-CM | POA: Diagnosis not present

## 2022-10-20 ENCOUNTER — Encounter: Payer: Self-pay | Admitting: Family Medicine

## 2022-10-21 ENCOUNTER — Telehealth: Payer: Self-pay | Admitting: Family Medicine

## 2022-10-21 NOTE — Telephone Encounter (Signed)
Contacted Chauncy Lean. to schedule their annual wellness visit. Appointment made for 12/13/2022.  [place] Direct Dial: [phone]

## 2022-10-29 ENCOUNTER — Other Ambulatory Visit: Payer: Self-pay

## 2022-10-29 MED ORDER — METFORMIN HCL ER 500 MG PO TB24: ORAL_TABLET | ORAL | 0 refills | Status: DC

## 2022-11-14 ENCOUNTER — Other Ambulatory Visit: Payer: Self-pay | Admitting: Family Medicine

## 2022-11-14 DIAGNOSIS — E119 Type 2 diabetes mellitus without complications: Secondary | ICD-10-CM

## 2022-11-18 DIAGNOSIS — H43811 Vitreous degeneration, right eye: Secondary | ICD-10-CM | POA: Diagnosis not present

## 2022-11-23 ENCOUNTER — Other Ambulatory Visit (INDEPENDENT_AMBULATORY_CARE_PROVIDER_SITE_OTHER): Payer: Medicare Other

## 2022-11-23 DIAGNOSIS — E119 Type 2 diabetes mellitus without complications: Secondary | ICD-10-CM

## 2022-11-23 LAB — CBC WITH DIFFERENTIAL/PLATELET
Basophils Absolute: 0 10*3/uL (ref 0.0–0.1)
Basophils Relative: 0.4 % (ref 0.0–3.0)
Eosinophils Absolute: 0.2 10*3/uL (ref 0.0–0.7)
Eosinophils Relative: 2.1 % (ref 0.0–5.0)
HCT: 41.8 % (ref 39.0–52.0)
Hemoglobin: 14.4 g/dL (ref 13.0–17.0)
Lymphocytes Relative: 20.7 % (ref 12.0–46.0)
Lymphs Abs: 1.6 10*3/uL (ref 0.7–4.0)
MCHC: 34.4 g/dL (ref 30.0–36.0)
MCV: 91.8 fl (ref 78.0–100.0)
Monocytes Absolute: 0.8 10*3/uL (ref 0.1–1.0)
Monocytes Relative: 10 % (ref 3.0–12.0)
Neutro Abs: 5 10*3/uL (ref 1.4–7.7)
Neutrophils Relative %: 66.8 % (ref 43.0–77.0)
Platelets: 221 10*3/uL (ref 150.0–400.0)
RBC: 4.55 Mil/uL (ref 4.22–5.81)
RDW: 12.9 % (ref 11.5–15.5)
WBC: 7.5 10*3/uL (ref 4.0–10.5)

## 2022-11-23 LAB — COMPREHENSIVE METABOLIC PANEL
ALT: 18 U/L (ref 0–53)
AST: 17 U/L (ref 0–37)
Albumin: 4.3 g/dL (ref 3.5–5.2)
Alkaline Phosphatase: 55 U/L (ref 39–117)
BUN: 14 mg/dL (ref 6–23)
CO2: 26 mEq/L (ref 19–32)
Calcium: 9.2 mg/dL (ref 8.4–10.5)
Chloride: 104 mEq/L (ref 96–112)
Creatinine, Ser: 1.07 mg/dL (ref 0.40–1.50)
GFR: 65.74 mL/min (ref >60.00)
Glucose, Bld: 189 mg/dL — ABNORMAL HIGH (ref 70–99)
Potassium: 4.4 mEq/L (ref 3.5–5.1)
Sodium: 141 mEq/L (ref 135–145)
Total Bilirubin: 0.5 mg/dL (ref 0.2–1.2)
Total Protein: 6.3 g/dL (ref 6.0–8.3)

## 2022-11-23 LAB — LIPID PANEL
Cholesterol: 123 mg/dL (ref 0–200)
HDL: 48.5 mg/dL (ref >39.00)
LDL Cholesterol: 55 mg/dL (ref 0–99)
NonHDL: 74.63
Total CHOL/HDL Ratio: 3
Triglycerides: 96 mg/dL (ref 0.0–149.0)
VLDL: 19.2 mg/dL (ref 0.0–40.0)

## 2022-11-23 LAB — HEMOGLOBIN A1C: Hgb A1c MFr Bld: 8.6 % — ABNORMAL HIGH (ref 4.6–6.5)

## 2022-11-23 LAB — MICROALBUMIN / CREATININE URINE RATIO
Creatinine,U: 112 mg/dL
Microalb Creat Ratio: 0.6 mg/g (ref 0.0–30.0)
Microalb, Ur: <0.7 mg/dL (ref 0.0–1.9)

## 2022-11-25 ENCOUNTER — Telehealth: Payer: Self-pay | Admitting: Family Medicine

## 2022-11-25 MED ORDER — METFORMIN HCL ER (OSM) 1000 MG PO TB24: ORAL_TABLET | ORAL | 3 refills | Status: DC

## 2022-11-25 NOTE — Telephone Encounter (Signed)
Patient called in and relayed message below.  

## 2022-11-25 NOTE — Telephone Encounter (Signed)
Sent 1000mg  tabs.  Take 2 per day.  Please update patient. Thanks.

## 2022-11-25 NOTE — Telephone Encounter (Signed)
Patient called in and stated that his pharmacy should be sending over a refill request for his  metFORMIN (GLUCOPHAGE XR) 500 MG 24 hr tablet. He stated that he would like 1000 MG tablets to take twice daily instead of 500 MG tablets to be sent over to [facility]) - [place], [place] - [address]. He would like a call if this can be done or if this can't be done. Please advise. Thank you!

## 2022-11-25 NOTE — Addendum Note (Signed)
Addended by: Joaquim Nam on: 11/25/2022 02:05 PM   Modules accepted: Orders

## 2022-11-25 NOTE — Telephone Encounter (Signed)
Tried to call patient but no answer and could not leave a message 

## 2022-11-26 ENCOUNTER — Encounter: Payer: Self-pay | Admitting: Family Medicine

## 2022-11-26 ENCOUNTER — Other Ambulatory Visit: Payer: Self-pay | Admitting: Family Medicine

## 2022-11-26 MED ORDER — METFORMIN HCL 1000 MG PO TABS: 1000.0000 mg | ORAL_TABLET | Freq: Two times a day (BID) | ORAL | 3 refills | Status: DC

## 2022-11-26 NOTE — Telephone Encounter (Signed)
Called and talked to patient.  Rx sent for metformin 1000mg  BID. Regular release.  I apologized to the patient for the confusion. He thanked me for the call.

## 2022-11-30 ENCOUNTER — Ambulatory Visit (INDEPENDENT_AMBULATORY_CARE_PROVIDER_SITE_OTHER): Payer: Medicare Other | Admitting: Family Medicine

## 2022-11-30 ENCOUNTER — Encounter: Payer: Self-pay | Admitting: Family Medicine

## 2022-11-30 VITALS — BP 120/60 | HR 75 | Temp 97.7°F | Ht 66.25 in | Wt 156.0 lb

## 2022-11-30 DIAGNOSIS — R454 Irritability and anger: Secondary | ICD-10-CM

## 2022-11-30 DIAGNOSIS — Z7189 Other specified counseling: Secondary | ICD-10-CM

## 2022-11-30 DIAGNOSIS — E119 Type 2 diabetes mellitus without complications: Secondary | ICD-10-CM | POA: Diagnosis not present

## 2022-11-30 DIAGNOSIS — E785 Hyperlipidemia, unspecified: Secondary | ICD-10-CM | POA: Diagnosis not present

## 2022-11-30 DIAGNOSIS — Z0000 Encounter for general adult medical examination without abnormal findings: Secondary | ICD-10-CM

## 2022-11-30 MED ORDER — GLIMEPIRIDE 2 MG PO TABS: 2.0000 mg | ORAL_TABLET | Freq: Every day | ORAL | 3 refills | Status: DC

## 2022-11-30 MED ORDER — ROSUVASTATIN CALCIUM 10 MG PO TABS: 10.0000 mg | ORAL_TABLET | Freq: Every day | ORAL | 3 refills | Status: DC

## 2022-11-30 MED ORDER — SERTRALINE HCL 50 MG PO TABS: 50.0000 mg | ORAL_TABLET | Freq: Every day | ORAL | 3 refills | Status: DC

## 2022-11-30 NOTE — Patient Instructions (Addendum)
Recheck A1c in about 3 months at a lab visit.   Ask for a repeat A1c 3 months after that, at the lab visit.  Take care.  Glad to see you.  [place] [facility] [address] [place], [place] [address] [phone]

## 2022-11-30 NOTE — Progress Notes (Unsigned)
Diabetes:  Using medications without difficulties: yes Hypoglycemic episodes: not checked often Hyperglycemic episodes: not checked often.   Feet problems: rare tingling in the evening.   Blood Sugars averaging: not checked often.    eye exam within last year: yes Labs d/w pt.    Elevated Cholesterol: Using medications without problems: yes Muscle aches: no claudication.  Some brief foot pain at rest.   Diet compliance: yes Exercise: yes  Mood d/w pt. Doing well. Mood is usually good.  He gets frustrated when he can't do his usual activity from decades past.  He went skiing at [place] for his 82th birthday.  He had a great time.  He was skiing at [place] elevation.  Tdap 2021 PNA prev done.   Flu 2023 Covid prev done  Shingles prev done.   Colonoscopy 2018.   Prostate cancer screening and PSA options (with potential risks and benefits of testing vs not testing) were discussed along with recent recs/guidelines.  He declined testing PSA at this point. Advance directive-daughter Jacobo Forest designated if patient were incapacitated.  HCV screening prev done at [facility] in the early 2000s.    PMH and SH reviewed.   Vital signs, Meds and allergies reviewed.  ROS: Per HPI unless specifically indicated in ROS section   GEN: nad, alert and oriented HEENT: ncat NECK: supple w/o LA CV: rrr.  PULM: ctab, no inc wob ABD: soft, +bs EXT: no edema L leg- fell loading mulch and hit R shin with subsequent swelling that is improving.   SKIN: no acute rash  Diabetic foot exam: Normal inspection except for L 2nd toe amputation.    No skin breakdown No calluses  Normal DP pulses Normal sensation to light tough and monofilament Nails normal

## 2022-12-01 NOTE — Assessment & Plan Note (Signed)
Advance directive daughter Julie Verchick designated if patient were incapacitated. 

## 2022-12-01 NOTE — Assessment & Plan Note (Signed)
Labs discussed with patient.  Continue Crestor.  Continue work on diet and exercise. °

## 2022-12-01 NOTE — Assessment & Plan Note (Signed)
Discussed not changing his medication at this point.  Continue Crestor metformin and glimepiride.  We can recheck his A1c in 3 months and then go from there.  He agrees with plan.

## 2022-12-01 NOTE — Assessment & Plan Note (Signed)
Mood d/w pt. Doing well. Mood is usually good.  He gets frustrated when he can't do his usual activity from decades past.  He went skiing at [place] for his 82th birthday.  He had a great time.  He was skiing at [place] elevation.  Would continue sertraline as is.

## 2022-12-01 NOTE — Assessment & Plan Note (Signed)
Tdap 2021 PNA prev done.   Flu 2023 Covid prev done  Shingles prev done.   Colonoscopy 2018.   Prostate cancer screening and PSA options (with potential risks and benefits of testing vs not testing) were discussed along with recent recs/guidelines.  He declined testing PSA at this point. Advance directive-daughter Jacobo Forest designated if patient were incapacitated.  HCV screening prev done at [facility] in the early 2000s.

## 2022-12-13 ENCOUNTER — Ambulatory Visit (INDEPENDENT_AMBULATORY_CARE_PROVIDER_SITE_OTHER): Payer: Medicare Other

## 2022-12-13 VITALS — Ht 67.0 in | Wt 154.0 lb

## 2022-12-13 DIAGNOSIS — Z0000 Encounter for general adult medical examination without abnormal findings: Secondary | ICD-10-CM

## 2022-12-13 NOTE — Progress Notes (Signed)
I connected with  Dorian Pod. on 12/13/22 by a audio enabled telemedicine application and verified that I am speaking with the correct person using two identifiers.  Patient Location: Home  Provider Location: Home Office  I discussed the limitations of evaluation and management by telemedicine. The patient expressed understanding and agreed to proceed.  Subjective:   Greg Rogers. is a 82 y.o. male who presents for Medicare Annual/Subsequent preventive examination.  Review of Systems      Cardiac Risk Factors include: advanced age (>46men, >69 women);male gender;sedentary lifestyle;diabetes mellitus     Objective:    Today's Vitals   12/13/22 0854  Weight: 154 lb (69.9 kg)  Height: 5\' 7"  (1.702 m)   Body mass index is 24.12 kg/m.     12/13/2022    9:06 AM 12/09/2021    8:37 AM 09/23/2019    9:59 PM 02/28/2018   11:51 AM 02/21/2017    2:05 PM 12/17/2015    9:03 AM  Advanced Directives  Does Patient Have a Medical Advance Directive? Yes Yes No Yes Yes Yes  Type of Estate agent of [place];Living will Healthcare Power of [employer] of [place];Living will Healthcare Power of [place];Living will Healthcare Power of [place];Living will  Does patient want to make changes to medical advance directive? No - Patient declined     No - Patient declined  Copy of Healthcare Power of Attorney in Chart? Yes - validated most recent copy scanned in chart (See row information) Yes - validated most recent copy scanned in chart (See row information)  Yes Yes No - copy requested  Would patient like information on creating a medical advance directive?   No - Patient declined       Current Medications (verified) Outpatient Encounter Medications as of 12/13/2022  Medication Sig   Accu-Chek FastClix Lancets MISC Use as needed to check sugar daily.  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   aspirin 81 MG EC tablet Take 81 mg by mouth daily.    B  Complex-C-Folic Acid (SUPER B COMPLEX/FA/VIT C) TABS    Blood Glucose Monitoring Suppl (ONETOUCH VERIO) w/Device KIT Use to check blood sugar up to 3 times a day. Dx: E11.9   EPINEPHrine 0.3 mg/0.3 mL IJ SOAJ injection Inject 0.3 mLs (0.3 mg total) into the muscle once. (Patient taking differently: Inject 0.3 mg into the muscle as needed.)   glimepiride (AMARYL) 2 MG tablet Take 1 tablet (2 mg total) by mouth daily with breakfast.   glucose blood (ONETOUCH VERIO) test strip Use as instructed   metFORMIN (GLUCOPHAGE) 1000 MG tablet Take 1 tablet (1,000 mg total) by mouth 2 (two) times daily with a meal.   nitroGLYCERIN (NITROSTAT) 0.4 MG SL tablet Place 1 tablet (0.4 mg total) under the tongue every 5 (five) minutes as needed for chest pain (max 3 doses/15 min.call MD/to ER if used).   omeprazole (PRILOSEC) 20 MG capsule Take 20 mg by mouth daily as needed. As needed   rosuvastatin (CRESTOR) 10 MG tablet Take 1 tablet (10 mg total) by mouth daily.   sertraline (ZOLOFT) 50 MG tablet Take 1 tablet (50 mg total) by mouth daily.   No facility-administered encounter medications on file as of 12/13/2022.    Allergies (verified) Azo [phenazopyridine], Bee venom, Erythromycin, Fire ant, Pneumococcal vaccine polyvalent, and Wasp venom   History: Past Medical History:  Diagnosis Date   Abnormal GGT test    With otherwise normal work  up.   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)    Persistent disorder of initiating or maintaining sleep    Pes planus    Rosacea    Snoring    Past Surgical History:  Procedure Laterality Date   APPENDECTOMY  1949   at age 43   CERVICAL FUSION  03/2016   C 3&4   Cyst removed from breast  1960   FOOT SURGERY  01/2009   Left   HERNIA REPAIR  1956   at age 68 (single)    HERNIA REPAIR  1960   (double)   Left 2nd toe removed  03/2017   SPINE SURGERY     TONSILLECTOMY AND ADENOIDECTOMY  ~ 1950   VASECTOMY  ~ 1984   Family History  Problem Relation Age of Onset   Heart disease Father 73       First MI   Hyperlipidemia Father    Heart attack Father    Diabetes Brother    Hyperlipidemia Brother    Hypertension Brother    Heart disease Brother        CAD s/p stent   CAD Brother    Heart disease Brother    Heart disease Brother    Diabetes Sister        DM2   Hyperlipidemia Sister    Heart disease Sister    Arthritis Other    Diabetes Other    ADD / ADHD Son    Depression Sister    Prostate cancer Neg Hx    Colon cancer Neg Hx    Social History   Socioeconomic History   Marital status: Married    Spouse name: Not on file   Number of children: 4   Years of education: Not on file   Highest education level: Not on file  Occupational History   Occupation: Retired Nov. 2007 27-1/2 years of service    Employer: RETIRED    Comment: Paediatric nurse   Occupation: Works part time at a desk job.  Tobacco Use   Smoking status: Never   Smokeless tobacco: Former    Types: Associate Professor Use: Never used  Substance and Sexual Activity   Alcohol use: Not Currently    Comment: 1 glass wine daily   Drug use: No   Sexual activity: Never  Other Topics Concern   Not on file  Social History Narrative   Education:  Masters   Charity fundraiser at 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   4 kids, 2 are MD's   7 grandkids   [employer] and stays active, enjoys skiing, very active, walks at least 1/2 mile daily   Social Determinants of Corporate investment banker Strain: Low Risk  (12/13/2022)   Overall Financial Resource Strain (CARDIA)    Difficulty of Paying Living Expenses: Not hard at all  Food Insecurity: No Food Insecurity (12/13/2022)   Hunger Vital Sign    Worried About Running Out of Food in the Last  Year: Never true    Ran Out of Food in the Last Year: Never true  Transportation Needs: No Transportation Needs (12/13/2022)   PRAPARE - Administrator, Civil Service (Medical): No  Lack of Transportation (Non-Medical): No  Physical Activity: Inactive (12/13/2022)   Exercise Vital Sign    Days of Exercise per Week: 0 days    Minutes of Exercise per Session: 0 min  Stress: No Stress Concern Present (12/13/2022)   [employer] of Occupational Health - Occupational Stress Questionnaire    Feeling of Stress : Not at all  Social Connections: Moderately Integrated (12/13/2022)   Social Connection and Isolation Panel [NHANES]    Frequency of Communication with Friends and Family: More than three times a week    Frequency of Social Gatherings with Friends and Family: More than three times a week    Attends Religious Services: Never    Database administrator or Organizations: Yes    Attends Engineer, structural: More than 4 times per year    Marital Status: Married  Recent Concern: Social Connections - Moderately Isolated (12/13/2022)   Social Connection and Isolation Panel [NHANES]    Frequency of Communication with Friends and Family: More than three times a week    Frequency of Social Gatherings with Friends and Family: More than three times a week    Attends Religious Services: Never    Database administrator or Organizations: No    Attends Engineer, structural: Never    Marital Status: Married    Tobacco Counseling Counseling given: Not Answered   Clinical Intake:  Pre-visit preparation completed: Yes  Pain : No/denies pain     Nutritional Risks: None Diabetes: Yes CBG done?: No Did pt. bring in CBG monitor from home?: No  How often do you need to have someone help you when you read instructions, pamphlets, or other written materials from your doctor or pharmacy?: 1 - Never  Diabetic?Nutrition Risk Assessment:  Has the patient had any  N/V/D within the last 2 months?  No  Does the patient have any non-healing wounds?  No  Has the patient had any unintentional weight loss or weight gain?  No   Diabetes:  Is the patient diabetic?  Yes  If diabetic, was a CBG obtained today?  No  Did the patient bring in their glucometer from home?  No  How often do you monitor your CBG's? Weekly.   Financial Strains and Diabetes Management:  Are you having any financial strains with the device, your supplies or your medication? No .  Does the patient want to be seen by Chronic Care Management for management of their diabetes?  No  Would the patient like to be referred to a Nutritionist or for Diabetic Management?  No   Diabetic Exams:  Diabetic Eye Exam: Completed 09/10/2022 [facility] Diabetic Foot Exam: Completed 11/30/2022    Interpreter Needed?: No  Information entered by :: C.Sammy Douthitt LPN   Activities of Daily Living    12/13/2022    9:06 AM  In your present state of health, do you have any difficulty performing the following activities:  Hearing? 0  Vision? 0  Difficulty concentrating or making decisions? 1  Comment occasionally forgets  Walking or climbing stairs? 0  Dressing or bathing? 0  Doing errands, shopping? 0  Preparing Food and eating ? N  Using the Toilet? N  In the past six months, have you accidently leaked urine? N  Do you have problems with loss of bowel control? N  Managing your Medications? N  Managing your Finances? N  Housekeeping or managing your Housekeeping? N    Patient Care Team: Joaquim Nam,  MD as PCP - Veto Kemps, MD as Consulting Physician (Ophthalmology) Vivien Rota as Consulting Physician (Dentistry) Sinda Du, MD as Consulting Physician (Ophthalmology)  Indicate any recent Medical Services you may have received from other than [facility] providers in the past year (date may be approximate).     Assessment:   This is a routine wellness  examination for Adel.  Hearing/Vision screen Hearing Screening - Comments:: No aids Vision Screening - Comments:: Glasses - [facility]  Dietary issues and exercise activities discussed: Current Exercise Habits: The patient does not participate in regular exercise at present, Exercise limited by: None identified   Goals Addressed             This Visit's Progress    Patient Stated       Inprove A1C       Depression Screen    12/13/2022    9:05 AM 11/30/2022    2:11 PM 12/09/2021    8:42 AM 03/26/2021    3:25 PM 02/28/2018   11:49 AM 02/21/2017    2:04 PM 12/17/2015    8:47 AM  PHQ 2/9 Scores  PHQ - 2 Score 0 0 0 0 0 0 0  PHQ- 9 Score 0 0   0      Fall Risk    12/13/2022    9:01 AM 11/30/2022    2:11 PM 12/09/2021    8:37 AM 10/10/2020    3:03 PM 10/10/2020    3:02 PM  Fall Risk   Falls in the past year? 0 1 0 1 1  Number falls in past yr: 0 0 0 0 0  Injury with Fall? 0 0 0 0 0  Risk for fall due to : No Fall Risks No Fall Risks     Follow up Falls prevention discussed;Falls evaluation completed Falls evaluation completed Falls evaluation completed;Education provided;Falls prevention discussed Falls evaluation completed Falls evaluation completed    FALL RISK PREVENTION PERTAINING TO THE HOME:  Any stairs in or around the home? No  If so, are there any without handrails? No  Home free of loose throw rugs in walkways, pet beds, electrical cords, etc? Yes  Adequate lighting in your home to reduce risk of falls? Yes   ASSISTIVE DEVICES UTILIZED TO PREVENT FALLS:  Life alert? No  Use of a cane, walker or w/c? No  Grab bars in the bathroom? No  Shower chair or bench in shower? No  Elevated toilet seat or a handicapped toilet? No    Cognitive Function:    02/28/2018   11:50 AM 02/21/2017    2:05 PM 12/17/2015    8:49 AM  MMSE - Mini Mental State Exam  Orientation to time 5 5 5   Orientation to Place 5 5 5   Registration 3 3 3   Attention/ Calculation 0 0  0  Recall 3 3 3   Language- name 2 objects 0 0 0  Language- repeat 1 1 1   Language- follow 3 step command 3 3 3   Language- read & follow direction 0 0 0  Write a sentence 0 0 0  Copy design 0 0 0  Total score 20 20 20         12/13/2022    9:07 AM 12/09/2021    8:38 AM  6CIT Screen  What Year? 0 points 0 points  What month? 0 points 0 points  What time? 0 points 0 points  Count back from 20 0 points 0 points  Months in reverse 0  points 0 points  Repeat phrase 0 points 0 points  Total Score 0 points 0 points    Immunizations Immunization History  Administered Date(s) Administered   Fluad Quad(high Dose 65+) 04/27/2019   Influenza Whole 05/21/2013   Influenza, High Dose Seasonal PF 05/25/2018, 05/19/2021, 05/17/2022   Influenza,inj,Quad PF,6+ Mos 05/06/2017, 05/25/2018   Influenza-Unspecified 05/29/2014, 05/21/2015, 04/12/2016, 05/25/2018, 05/27/2020   Moderna Sars-Covid-2 Vaccination 09/04/2019, 10/03/2019, 06/13/2020, 01/16/2021   Pneumococcal Polysaccharide-23 08/17/2003   Td 08/16/2004, 09/26/2012   Tdap 09/24/2019   Zoster Recombinat (Shingrix) 03/23/2019, 06/05/2019    TDAP status: Up to date  Flu Vaccine status: Up to date  Pneumococcal vaccine status: Due, Education has been provided regarding the importance of this vaccine. Advised may receive this vaccine at local pharmacy or Health Dept. Aware to provide a copy of the vaccination record if obtained from local pharmacy or Health Dept. Verbalized acceptance and understanding.  Covid-19 vaccine status: Completed vaccines  Qualifies for Shingles Vaccine? Yes   Zostavax completed Yes   Shingrix Completed?: Yes  Screening Tests Health Maintenance  Topic Date Due   Pneumonia Vaccine 58+ Years old (2 of 2 - PCV) 10/24/2007   COVID-19 Vaccine (5 - 2023-24 season) 04/16/2022   INFLUENZA VACCINE  03/17/2023   HEMOGLOBIN A1C  05/25/2023   OPHTHALMOLOGY EXAM  09/11/2023   Diabetic kidney evaluation - eGFR  measurement  11/23/2023   Diabetic kidney evaluation - Urine ACR  11/23/2023   FOOT EXAM  11/30/2023   Medicare Annual Wellness (AWV)  12/13/2023   DTaP/Tdap/Td (4 - Td or Tdap) 09/23/2029   Zoster Vaccines- Shingrix  Completed   HPV VACCINES  Aged Out    Health Maintenance  Health Maintenance Due  Topic Date Due   Pneumonia Vaccine 61+ Years old (2 of 2 - PCV) 10/24/2007   COVID-19 Vaccine (5 - 2023-24 season) 04/16/2022    Colorectal cancer screening: No longer required.   Lung Cancer Screening: (Low Dose CT Chest recommended if Age 22-80 years, 30 pack-year currently smoking OR have quit w/in 15years.) does not qualify.   Lung Cancer Screening Referral: no  Additional Screening:  Hepatitis C Screening: does not qualify; Completed no  Vision Screening: Recommended annual ophthalmology exams for early detection of glaucoma and other disorders of the eye. Is the patient up to date with their annual eye exam?  Yes  Who is the provider or what is the name of the office in which the patient attends annual eye exams? [facility] If pt is not established with a provider, would they like to be referred to a provider to establish care? No .   Dental Screening: Recommended annual dental exams for proper oral hygiene  Community Resource Referral / Chronic Care Management: CRR required this visit?  No   CCM required this visit?  No      Plan:     I have personally reviewed and noted the following in the patient's chart:   Medical and social history Use of alcohol, tobacco or illicit drugs  Current medications and supplements including opioid prescriptions. Patient is not currently taking opioid prescriptions. Functional ability and status Nutritional status Physical activity Advanced directives List of other physicians Hospitalizations, surgeries, and ER visits in previous 12 months Vitals Screenings to include cognitive, depression, and falls Referrals and  appointments  In addition, I have reviewed and discussed with patient certain preventive protocols, quality metrics, and best practice recommendations. A written personalized care plan for preventive services as well as general  preventive health recommendations were provided to patient.     Maryan Puls, LPN   [id number]/[id number]   Nurse Notes: none

## 2022-12-13 NOTE — Patient Instructions (Signed)
Greg Rogers , Thank you for taking time to come for your Medicare Wellness Visit. I appreciate your ongoing commitment to your health goals. Please review the following plan we discussed and let me know if I can assist you in the future.   These are the goals we discussed:  Goals      Patient Stated     Starting 07/01/2018, I will continue to take medications as prescribed.      Patient Stated     Would like to ski more in [place]     Patient Stated     Inprove A1C        This is a list of the screening recommended for you and due dates:  Health Maintenance  Topic Date Due   Pneumonia Vaccine (2 of 2 - PCV) 10/24/2007   COVID-19 Vaccine (5 - 2023-24 season) 04/16/2022   Flu Shot  03/17/2023   Hemoglobin A1C  05/25/2023   Eye exam for diabetics  09/11/2023   Yearly kidney function blood test for diabetes  11/23/2023   Yearly kidney health urinalysis for diabetes  11/23/2023   Complete foot exam   11/30/2023   Medicare Annual Wellness Visit  12/13/2023   DTaP/Tdap/Td vaccine (4 - Td or Tdap) 09/23/2029   Zoster (Shingles) Vaccine  Completed   HPV Vaccine  Aged Out    Advanced directives: copy is in the chart.  Conditions/risks identified: Aim for 30 minutes of exercise or brisk walking, 6-8 glasses of water, and 5 servings of fruits and vegetables each day.   Next appointment: Follow up in one year for your annual wellness visit. 12/14/2023 @ 8:45 televisit  Preventive Care 65 Years and Older, Male  Preventive care refers to lifestyle choices and visits with your health care provider that can promote health and wellness. What does preventive care include? A yearly physical exam. This is also called an annual well check. Dental exams once or twice a year. Routine eye exams. Ask your health care provider how often you should have your eyes checked. Personal lifestyle choices, including: Daily care of your teeth and gums. Regular physical activity. Eating a healthy  diet. Avoiding tobacco and drug use. Limiting alcohol use. Practicing safe sex. Taking low doses of aspirin every day. Taking vitamin and mineral supplements as recommended by your health care provider. What happens during an annual well check? The services and screenings done by your health care provider during your annual well check will depend on your age, overall health, lifestyle risk factors, and family history of disease. Counseling  Your health care provider may ask you questions about your: Alcohol use. Tobacco use. Drug use. Emotional well-being. Home and relationship well-being. Sexual activity. Eating habits. History of falls. Memory and ability to understand (cognition). Work and work Astronomer. Screening  You may have the following tests or measurements: Height, weight, and BMI. Blood pressure. Lipid and cholesterol levels. These may be checked every 5 years, or more frequently if you are over 47 years old. Skin check. Lung cancer screening. You may have this screening every year starting at age 46 if you have a 30-pack-year history of smoking and currently smoke or have quit within the past 15 years. Fecal occult blood test (FOBT) of the stool. You may have this test every year starting at age 16. Flexible sigmoidoscopy or colonoscopy. You may have a sigmoidoscopy every 5 years or a colonoscopy every 10 years starting at age 24. Prostate cancer screening. Recommendations will vary depending on  your family history and other risks. Hepatitis C blood test. Hepatitis B blood test. Sexually transmitted disease (STD) testing. Diabetes screening. This is done by checking your blood sugar (glucose) after you have not eaten for a while (fasting). You may have this done every 1-3 years. Abdominal aortic aneurysm (AAA) screening. You may need this if you are a current or former smoker. Osteoporosis. You may be screened starting at age 29 if you are at high risk. Talk with  your health care provider about your test results, treatment options, and if necessary, the need for more tests. Vaccines  Your health care provider may recommend certain vaccines, such as: Influenza vaccine. This is recommended every year. Tetanus, diphtheria, and acellular pertussis (Tdap, Td) vaccine. You may need a Td booster every 10 years. Zoster vaccine. You may need this after age 98. Pneumococcal 13-valent conjugate (PCV13) vaccine. One dose is recommended after age 16. Pneumococcal polysaccharide (PPSV23) vaccine. One dose is recommended after age 68. Talk to your health care provider about which screenings and vaccines you need and how often you need them. This information is not intended to replace advice given to you by your health care provider. Make sure you discuss any questions you have with your health care provider. Document Released: 08/29/2015 Document Revised: 04/21/2016 Document Reviewed: 06/03/2015 Elsevier Interactive Patient Education  2017 [place] Prevention in the Home Falls can cause injuries. They can happen to people of all ages. There are many things you can do to make your home safe and to help prevent falls. What can I do on the outside of my home? Regularly fix the edges of walkways and driveways and fix any cracks. Remove anything that might make you trip as you walk through a door, such as a raised step or threshold. Trim any bushes or trees on the path to your home. Use bright outdoor lighting. Clear any walking paths of anything that might make someone trip, such as rocks or tools. Regularly check to see if handrails are loose or broken. Make sure that both sides of any steps have handrails. Any raised decks and porches should have guardrails on the edges. Have any leaves, snow, or ice cleared regularly. Use sand or salt on walking paths during winter. Clean up any spills in your garage right away. This includes oil or grease spills. What  can I do in the bathroom? Use night lights. Install grab bars by the toilet and in the tub and shower. Do not use towel bars as grab bars. Use non-skid mats or decals in the tub or shower. If you need to sit down in the shower, use a plastic, non-slip stool. Keep the floor dry. Clean up any water that spills on the floor as soon as it happens. Remove soap buildup in the tub or shower regularly. Attach bath mats securely with double-sided non-slip rug tape. Do not have throw rugs and other things on the floor that can make you trip. What can I do in the bedroom? Use night lights. Make sure that you have a light by your bed that is easy to reach. Do not use any sheets or blankets that are too big for your bed. They should not hang down onto the floor. Have a firm chair that has side arms. You can use this for support while you get dressed. Do not have throw rugs and other things on the floor that can make you trip. What can I do in the kitchen? Clean  up any spills right away. Avoid walking on wet floors. Keep items that you use a lot in easy-to-reach places. If you need to reach something above you, use a strong step stool that has a grab bar. Keep electrical cords out of the way. Do not use floor polish or wax that makes floors slippery. If you must use wax, use non-skid floor wax. Do not have throw rugs and other things on the floor that can make you trip. What can I do with my stairs? Do not leave any items on the stairs. Make sure that there are handrails on both sides of the stairs and use them. Fix handrails that are broken or loose. Make sure that handrails are as long as the stairways. Check any carpeting to make sure that it is firmly attached to the stairs. Fix any carpet that is loose or worn. Avoid having throw rugs at the top or bottom of the stairs. If you do have throw rugs, attach them to the floor with carpet tape. Make sure that you have a light switch at the top of the  stairs and the bottom of the stairs. If you do not have them, ask someone to add them for you. What else can I do to help prevent falls? Wear shoes that: Do not have high heels. Have rubber bottoms. Are comfortable and fit you well. Are closed at the toe. Do not wear sandals. If you use a stepladder: Make sure that it is fully opened. Do not climb a closed stepladder. Make sure that both sides of the stepladder are locked into place. Ask someone to hold it for you, if possible. Clearly mark and make sure that you can see: Any grab bars or handrails. First and last steps. Where the edge of each step is. Use tools that help you move around (mobility aids) if they are needed. These include: Canes. Walkers. Scooters. Crutches. Turn on the lights when you go into a dark area. Replace any light bulbs as soon as they burn out. Set up your furniture so you have a clear path. Avoid moving your furniture around. If any of your floors are uneven, fix them. If there are any pets around you, be aware of where they are. Review your medicines with your doctor. Some medicines can make you feel dizzy. This can increase your chance of falling. Ask your doctor what other things that you can do to help prevent falls. This information is not intended to replace advice given to you by your health care provider. Make sure you discuss any questions you have with your health care provider. Document Released: 05/29/2009 Document Revised: 01/08/2016 Document Reviewed: 09/06/2014 Elsevier Interactive Patient Education  2017 [employer].

## 2022-12-31 ENCOUNTER — Ambulatory Visit (INDEPENDENT_AMBULATORY_CARE_PROVIDER_SITE_OTHER): Payer: Medicare Other | Admitting: Podiatry

## 2022-12-31 DIAGNOSIS — E119 Type 2 diabetes mellitus without complications: Secondary | ICD-10-CM

## 2022-12-31 DIAGNOSIS — L84 Corns and callosities: Secondary | ICD-10-CM | POA: Diagnosis not present

## 2022-12-31 DIAGNOSIS — M216X2 Other acquired deformities of left foot: Secondary | ICD-10-CM

## 2022-12-31 DIAGNOSIS — M21619 Bunion of unspecified foot: Secondary | ICD-10-CM

## 2022-12-31 NOTE — Progress Notes (Unsigned)
Watns same shoe

## 2023-01-12 ENCOUNTER — Encounter: Payer: Self-pay | Admitting: Family Medicine

## 2023-01-12 MED ORDER — ACCU-CHEK FASTCLIX LANCETS MISC: 3 refills | Status: DC

## 2023-01-24 ENCOUNTER — Other Ambulatory Visit: Payer: Self-pay | Admitting: Family Medicine

## 2023-01-24 DIAGNOSIS — E119 Type 2 diabetes mellitus without complications: Secondary | ICD-10-CM

## 2023-01-27 ENCOUNTER — Other Ambulatory Visit: Payer: Self-pay | Admitting: Family Medicine

## 2023-01-27 DIAGNOSIS — E119 Type 2 diabetes mellitus without complications: Secondary | ICD-10-CM

## 2023-01-27 NOTE — Telephone Encounter (Signed)
Patient called in and was following up on his test strips. He stated that he hasn't received them yet. Thank you!

## 2023-02-09 DIAGNOSIS — L821 Other seborrheic keratosis: Secondary | ICD-10-CM | POA: Diagnosis not present

## 2023-02-09 DIAGNOSIS — L820 Inflamed seborrheic keratosis: Secondary | ICD-10-CM | POA: Diagnosis not present

## 2023-02-09 DIAGNOSIS — L570 Actinic keratosis: Secondary | ICD-10-CM | POA: Diagnosis not present

## 2023-02-18 ENCOUNTER — Ambulatory Visit (INDEPENDENT_AMBULATORY_CARE_PROVIDER_SITE_OTHER): Payer: Medicare Other | Admitting: Podiatry

## 2023-02-18 DIAGNOSIS — L84 Corns and callosities: Secondary | ICD-10-CM

## 2023-02-18 DIAGNOSIS — E119 Type 2 diabetes mellitus without complications: Secondary | ICD-10-CM

## 2023-02-18 DIAGNOSIS — M216X2 Other acquired deformities of left foot: Secondary | ICD-10-CM

## 2023-02-18 DIAGNOSIS — M21619 Bunion of unspecified foot: Secondary | ICD-10-CM

## 2023-02-18 NOTE — Progress Notes (Signed)
Patient presents today to be measured for  diabetic shoes and insoles.  Patient was measured for  1 pair of diabetic shoes and 3 pairs of foam casted diabetic insoles.   Ht 5'7 Wt  158  Shoe size 10w Shoe type brooks black lace up   Treating physician Dr Crawford Givens  Re-appointment for regularly scheduled diabetic foot care visits or if they should experience any trouble with the shoes or insoles.

## 2023-03-01 ENCOUNTER — Other Ambulatory Visit: Payer: Medicare Other

## 2023-03-01 DIAGNOSIS — E119 Type 2 diabetes mellitus without complications: Secondary | ICD-10-CM | POA: Diagnosis not present

## 2023-03-01 LAB — HEMOGLOBIN A1C: Hgb A1c MFr Bld: 8.4 % — ABNORMAL HIGH (ref 4.6–6.5)

## 2023-03-02 ENCOUNTER — Other Ambulatory Visit: Payer: Medicare Other

## 2023-03-07 ENCOUNTER — Encounter: Payer: Self-pay | Admitting: Family Medicine

## 2023-03-10 ENCOUNTER — Other Ambulatory Visit: Payer: Self-pay | Admitting: Family Medicine

## 2023-03-10 MED ORDER — METFORMIN HCL ER 500 MG PO TB24: 1000.0000 mg | ORAL_TABLET | Freq: Every evening | ORAL | 1 refills | Status: DC

## 2023-03-15 ENCOUNTER — Ambulatory Visit (INDEPENDENT_AMBULATORY_CARE_PROVIDER_SITE_OTHER): Payer: Medicare Other

## 2023-03-15 DIAGNOSIS — M21619 Bunion of unspecified foot: Secondary | ICD-10-CM

## 2023-03-15 DIAGNOSIS — M216X2 Other acquired deformities of left foot: Secondary | ICD-10-CM | POA: Diagnosis not present

## 2023-03-15 DIAGNOSIS — L84 Corns and callosities: Secondary | ICD-10-CM

## 2023-03-15 DIAGNOSIS — Z8639 Personal history of other endocrine, nutritional and metabolic disease: Secondary | ICD-10-CM

## 2023-03-15 DIAGNOSIS — M21611 Bunion of right foot: Secondary | ICD-10-CM | POA: Diagnosis not present

## 2023-03-15 DIAGNOSIS — E119 Type 2 diabetes mellitus without complications: Secondary | ICD-10-CM | POA: Diagnosis not present

## 2023-03-15 NOTE — Progress Notes (Signed)

## 2023-04-21 DIAGNOSIS — Z23 Encounter for immunization: Secondary | ICD-10-CM | POA: Diagnosis not present

## 2023-06-02 ENCOUNTER — Ambulatory Visit: Payer: Medicare Other | Admitting: Family Medicine

## 2023-06-02 ENCOUNTER — Encounter: Payer: Self-pay | Admitting: Family Medicine

## 2023-06-02 VITALS — BP 118/60 | HR 69 | Temp 98.0°F | Ht 67.0 in | Wt 153.6 lb

## 2023-06-02 DIAGNOSIS — J3489 Other specified disorders of nose and nasal sinuses: Secondary | ICD-10-CM | POA: Insufficient documentation

## 2023-06-02 DIAGNOSIS — E119 Type 2 diabetes mellitus without complications: Secondary | ICD-10-CM | POA: Diagnosis not present

## 2023-06-02 LAB — POCT GLYCOSYLATED HEMOGLOBIN (HGB A1C): Hemoglobin A1C: 7.8 % — AB (ref 4.0–5.6)

## 2023-06-02 MED ORDER — MUPIROCIN 2 % EX OINT: 1.0000 | TOPICAL_OINTMENT | Freq: Two times a day (BID) | CUTANEOUS | Status: DC

## 2023-06-02 MED ORDER — GLIMEPIRIDE 2 MG PO TABS: 2.0000 mg | ORAL_TABLET | Freq: Every day | ORAL | Status: DC

## 2023-06-02 MED ORDER — NITROGLYCERIN 0.4 MG SL SUBL: 0.4000 mg | SUBLINGUAL_TABLET | SUBLINGUAL | 3 refills | Status: AC | PRN

## 2023-06-02 MED ORDER — METFORMIN HCL 1000 MG PO TABS: 1000.0000 mg | ORAL_TABLET | Freq: Every day | ORAL | Status: DC

## 2023-06-02 NOTE — Progress Notes (Signed)
Diabetes:  Using medications without difficulties: yes Hypoglycemic episodes: no Hyperglycemic episodes: no Feet problems:no Blood Sugars averaging: recently 150-180, prev higher prior to med change.   eye exam within last year: yes A1c d/w pt at OV.  7.8, improved.   Taking 1000mg  metformin in the AM and 1000mg  XR in the evening.  Taking 2mg  glimepiride.    No recent NTG use but rx is old, refill sent.   RSV vaccine d/w pt.    He used antibiotic ointment in the nostrils for an irritated lesion.  Now with scabbed lesion in L nostril.  Variable lesions, had been in the R nostril prev.  Improved with ointment recently.    PMH and SH reviewed  Meds, vitals, and allergies reviewed.   ROS: Per HPI unless specifically indicated in ROS section   GEN: nad, alert and oriented HEENT: ncat, small scabbed lesion in the L nostril, on the lateral side.  No spreading erythema or ulceration. NECK: supple w/o LA CV: rrr. PULM: ctab, no inc wob ABD: soft, +bs EXT: no edema SKIN: Well-perfused

## 2023-06-02 NOTE — Assessment & Plan Note (Addendum)
Would continue using antibiotic ointment concurrently in both nostrils for 5 days and see if he can get these lesions to completely resolve.  Suspect he has a benign but recurrent superficial mild infection

## 2023-06-02 NOTE — Patient Instructions (Addendum)
RSV vaccine when possible.  Take care.  Glad to see you.  Plan on recheck at yearly visit in 11/2023.  Labs ahead of time. Update me sooner if needed.   You could try taking 1.5 tabs of glimepiride as long as you don't have low sugars.  If you have any low sugars then cut back to 1 pill a day.    Use antibiotic ointment twice a day in both nostrils for 5 days.  Update me as needed.

## 2023-06-02 NOTE — Assessment & Plan Note (Signed)
Goal to avoid low sugars.  Plan on recheck at yearly visit in 11/2023.  Labs ahead of time. Update me sooner if needed.   He could try taking 1.5 tabs of glimepiride as long as he doesn't have low sugars.  If you have any low sugars then cut back to 1 pill a day.  Continue 1000 mg metformin in the morning and 1000 mg XR in the evening.

## 2023-06-03 NOTE — Telephone Encounter (Signed)
I have added this to the med list.

## 2023-06-07 ENCOUNTER — Encounter: Payer: Self-pay | Admitting: Family Medicine

## 2023-06-07 NOTE — Telephone Encounter (Signed)
Request for records sent to pharmacy

## 2023-06-08 NOTE — Telephone Encounter (Signed)
Noted. Thanks.

## 2023-08-01 DIAGNOSIS — H2000 Unspecified acute and subacute iridocyclitis: Secondary | ICD-10-CM | POA: Diagnosis not present

## 2023-08-04 DIAGNOSIS — H2000 Unspecified acute and subacute iridocyclitis: Secondary | ICD-10-CM | POA: Diagnosis not present

## 2023-08-05 ENCOUNTER — Encounter: Payer: Self-pay | Admitting: Family Medicine

## 2023-09-02 ENCOUNTER — Other Ambulatory Visit: Payer: Medicare Other

## 2023-09-04 ENCOUNTER — Other Ambulatory Visit: Payer: Self-pay | Admitting: Family Medicine

## 2023-09-04 DIAGNOSIS — E119 Type 2 diabetes mellitus without complications: Secondary | ICD-10-CM

## 2023-09-05 ENCOUNTER — Other Ambulatory Visit (INDEPENDENT_AMBULATORY_CARE_PROVIDER_SITE_OTHER): Payer: Medicare Other

## 2023-09-05 DIAGNOSIS — E119 Type 2 diabetes mellitus without complications: Secondary | ICD-10-CM

## 2023-09-05 DIAGNOSIS — Z7984 Long term (current) use of oral hypoglycemic drugs: Secondary | ICD-10-CM

## 2023-09-05 LAB — CBC WITH DIFFERENTIAL/PLATELET
Basophils Absolute: 0 10*3/uL (ref 0.0–0.1)
Basophils Relative: 0.3 % (ref 0.0–3.0)
Eosinophils Absolute: 0.1 10*3/uL (ref 0.0–0.7)
Eosinophils Relative: 1.2 % (ref 0.0–5.0)
HCT: 43.8 % (ref 39.0–52.0)
Hemoglobin: 14.8 g/dL (ref 13.0–17.0)
Lymphocytes Relative: 11.6 % — ABNORMAL LOW (ref 12.0–46.0)
Lymphs Abs: 0.9 10*3/uL (ref 0.7–4.0)
MCHC: 33.9 g/dL (ref 30.0–36.0)
MCV: 93.8 fL (ref 78.0–100.0)
Monocytes Absolute: 0.8 10*3/uL (ref 0.1–1.0)
Monocytes Relative: 11 % (ref 3.0–12.0)
Neutro Abs: 5.8 10*3/uL (ref 1.4–7.7)
Neutrophils Relative %: 75.9 % (ref 43.0–77.0)
Platelets: 159 10*3/uL (ref 150.0–400.0)
RBC: 4.66 Mil/uL (ref 4.22–5.81)
RDW: 12.9 % (ref 11.5–15.5)
WBC: 7.6 10*3/uL (ref 4.0–10.5)

## 2023-09-05 LAB — COMPREHENSIVE METABOLIC PANEL
ALT: 21 U/L (ref 0–53)
AST: 19 U/L (ref 0–37)
Albumin: 4.3 g/dL (ref 3.5–5.2)
Alkaline Phosphatase: 64 U/L (ref 39–117)
BUN: 16 mg/dL (ref 6–23)
CO2: 29 meq/L (ref 19–32)
Calcium: 9.3 mg/dL (ref 8.4–10.5)
Chloride: 99 meq/L (ref 96–112)
Creatinine, Ser: 1.05 mg/dL (ref 0.40–1.50)
GFR: 66.87 mL/min (ref >60.00)
Glucose, Bld: 275 mg/dL — ABNORMAL HIGH (ref 70–99)
Potassium: 4.5 meq/L (ref 3.5–5.1)
Sodium: 137 meq/L (ref 135–145)
Total Bilirubin: 0.8 mg/dL (ref 0.2–1.2)
Total Protein: 6.3 g/dL (ref 6.0–8.3)

## 2023-09-05 LAB — LIPID PANEL
Cholesterol: 129 mg/dL (ref 0–200)
HDL: 52.4 mg/dL (ref >39.00)
LDL Cholesterol: 43 mg/dL (ref 0–99)
NonHDL: 76.43
Total CHOL/HDL Ratio: 2
Triglycerides: 169 mg/dL — ABNORMAL HIGH (ref 0.0–149.0)
VLDL: 33.8 mg/dL (ref 0.0–40.0)

## 2023-09-05 LAB — MICROALBUMIN / CREATININE URINE RATIO
Creatinine,U: 110.7 mg/dL
Microalb Creat Ratio: 0.6 mg/g (ref 0.0–30.0)
Microalb, Ur: <0.7 mg/dL (ref 0.0–1.9)

## 2023-09-05 LAB — HEMOGLOBIN A1C: Hgb A1c MFr Bld: 8.8 % — ABNORMAL HIGH (ref 4.6–6.5)

## 2023-09-08 DIAGNOSIS — H2513 Age-related nuclear cataract, bilateral: Secondary | ICD-10-CM | POA: Diagnosis not present

## 2023-09-08 DIAGNOSIS — H2000 Unspecified acute and subacute iridocyclitis: Secondary | ICD-10-CM | POA: Diagnosis not present

## 2023-09-13 DIAGNOSIS — H2513 Age-related nuclear cataract, bilateral: Secondary | ICD-10-CM | POA: Diagnosis not present

## 2023-09-13 DIAGNOSIS — H524 Presbyopia: Secondary | ICD-10-CM | POA: Diagnosis not present

## 2023-09-13 LAB — HM DIABETES EYE EXAM

## 2023-09-14 ENCOUNTER — Other Ambulatory Visit: Payer: Self-pay | Admitting: Family Medicine

## 2023-09-15 ENCOUNTER — Ambulatory Visit (INDEPENDENT_AMBULATORY_CARE_PROVIDER_SITE_OTHER): Payer: Medicare Other | Admitting: Family Medicine

## 2023-09-15 ENCOUNTER — Encounter: Payer: Self-pay | Admitting: Family Medicine

## 2023-09-15 VITALS — BP 138/62 | HR 64 | Temp 97.9°F | Ht 67.0 in | Wt 149.8 lb

## 2023-09-15 DIAGNOSIS — E119 Type 2 diabetes mellitus without complications: Secondary | ICD-10-CM

## 2023-09-15 DIAGNOSIS — Z7984 Long term (current) use of oral hypoglycemic drugs: Secondary | ICD-10-CM

## 2023-09-15 MED ORDER — GLIMEPIRIDE 2 MG PO TABS: 3.0000 mg | ORAL_TABLET | Freq: Every day | ORAL | Status: DC

## 2023-09-15 MED ORDER — SITAGLIPTIN PHOSPHATE 50 MG PO TABS: 50.0000 mg | ORAL_TABLET | Freq: Every day | ORAL | 1 refills | Status: DC

## 2023-09-15 NOTE — Progress Notes (Signed)
Diabetes:  Using medications without difficulties: yes Hypoglycemic episodes:no Hyperglycemic episodes:no Feet problems:no Blood Sugars averaging: see below.   eye exam within last year: yes He was running 140-160 prior to Christmas.  Since then his sugar has consistently been higher.    He skied for mult days in [place].  He did well with that.    PMH and SH reviewed  Meds, vitals, and allergies reviewed.   ROS: Per HPI unless specifically indicated in ROS section   GEN: nad, alert and oriented HEENT: mucous membranes moist NECK: supple w/o LA CV: rrr. PULM: ctab, no inc wob ABD: soft, +bs EXT: no edema SKIN: well perfused.

## 2023-09-15 NOTE — Patient Instructions (Addendum)
I would try januvia 50mg .  If it is profoundly expensive, then let me know.    Update me about your sugars in about 2 weeks.  If any low sugars, then stop glimepiride and let me know.    You may need to transition off glimepiride in the future and you may end up needing a relatively low dose of insulin.   Plan on recheck A1c at a visit in about 3 months.  You don't need to fast.   Take care.  Glad to see you.

## 2023-09-18 NOTE — Assessment & Plan Note (Signed)
Discussed options, reasonable to escalate medical tx. Discussed options.  I would try januvia 50mg .  If it is profoundly expensive, then he can let me know.    He can update me about his sugars in about 2 weeks.  If any low sugars, then stop glimepiride and let me know.    He may need to transition off glimepiride in the future and he may end up needing a relatively low dose of insulin.  D/w pt.   Plan on recheck A1c at a visit in about 3 months.

## 2023-09-30 ENCOUNTER — Encounter: Payer: Self-pay | Admitting: Family Medicine

## 2023-10-13 ENCOUNTER — Other Ambulatory Visit: Payer: Self-pay | Admitting: Family Medicine

## 2023-10-31 ENCOUNTER — Ambulatory Visit (INDEPENDENT_AMBULATORY_CARE_PROVIDER_SITE_OTHER): Admitting: Family Medicine

## 2023-10-31 VITALS — BP 124/78 | HR 79 | Temp 98.0°F | Ht 67.0 in | Wt 150.0 lb

## 2023-10-31 DIAGNOSIS — R195 Other fecal abnormalities: Secondary | ICD-10-CM | POA: Diagnosis not present

## 2023-10-31 NOTE — Patient Instructions (Signed)
 Restart regular diet and take a stool softener if needed.  You can add in extra fiber if needed.  Take care.  Glad to see you.

## 2023-10-31 NOTE — Progress Notes (Unsigned)
 He fell prev, back on 10/02/23.  Tripped.  Wasn't lightheaded.  Was able to get up.    Then had a fall last week skiing.  Was getting on a lift and went down.  L shoulder ROM improved in the meantime.  Still with some L chest wall pain with a cough.    He had a recent cold with clear rhinorrhea.  No fevers.    He had recent constipation and had 3 accidents with BMs, but not in the last week.  No numbness in the legs. No bladder leakage, can control his bladder well.  One formed stool recently, some looser stools. Diet has been atypical recently.  Had taken ibuprofen prn, a few doses.  No other pain meds.  No blood in stool.    Tailbone pain is better overall.  He planted a lot of broccoli plants yesterday and isn't having pain now.    He had BM this AM, with effort.   Meds, vitals, and allergies reviewed.   ROS: Per HPI unless specifically indicated in ROS section

## 2023-11-02 DIAGNOSIS — R195 Other fecal abnormalities: Secondary | ICD-10-CM | POA: Insufficient documentation

## 2023-11-02 NOTE — Assessment & Plan Note (Signed)
 Without bloody or black stools.  He had significant diet changes recently with travel.  I do not expect him to have a significant back injury that is contributing to his bowel control.  He could have had blow-by diarrhea that was related to constipation.  [place] has been [place] over the last [place].  He is not having urinary incontinence.  He does not have weakness in the lower extremities on gross exam and he does not have saddle anesthesia on exam.  Benign abdominal exam today.  I expect his chest wall pain to improve.  Discussed that he could restart his regular diet and take a stool softener if needed.  He can add in extra fiber if needed.  He can update me as needed.

## 2023-11-10 ENCOUNTER — Other Ambulatory Visit: Payer: Self-pay | Admitting: Family Medicine

## 2023-11-14 ENCOUNTER — Other Ambulatory Visit (HOSPITAL_COMMUNITY): Payer: Self-pay

## 2023-11-15 ENCOUNTER — Other Ambulatory Visit: Payer: Self-pay

## 2023-11-15 MED ORDER — METFORMIN HCL ER 500 MG PO TB24: 1000.0000 mg | ORAL_TABLET | Freq: Every evening | ORAL | 1 refills | Status: DC

## 2023-12-06 ENCOUNTER — Ambulatory Visit (INDEPENDENT_AMBULATORY_CARE_PROVIDER_SITE_OTHER): Payer: Medicare Other | Admitting: Family Medicine

## 2023-12-06 ENCOUNTER — Encounter: Payer: Self-pay | Admitting: Family Medicine

## 2023-12-06 VITALS — BP 138/76 | HR 68 | Temp 97.9°F | Ht 67.0 in | Wt 151.0 lb

## 2023-12-06 DIAGNOSIS — Z7984 Long term (current) use of oral hypoglycemic drugs: Secondary | ICD-10-CM | POA: Diagnosis not present

## 2023-12-06 DIAGNOSIS — E119 Type 2 diabetes mellitus without complications: Secondary | ICD-10-CM

## 2023-12-06 DIAGNOSIS — E785 Hyperlipidemia, unspecified: Secondary | ICD-10-CM

## 2023-12-06 DIAGNOSIS — R454 Irritability and anger: Secondary | ICD-10-CM

## 2023-12-06 DIAGNOSIS — M25519 Pain in unspecified shoulder: Secondary | ICD-10-CM

## 2023-12-06 DIAGNOSIS — Z7189 Other specified counseling: Secondary | ICD-10-CM

## 2023-12-06 DIAGNOSIS — Z0000 Encounter for general adult medical examination without abnormal findings: Secondary | ICD-10-CM

## 2023-12-06 LAB — POCT GLYCOSYLATED HEMOGLOBIN (HGB A1C): Hemoglobin A1C: 8.2 % — AB (ref 4.0–5.6)

## 2023-12-06 MED ORDER — GLIMEPIRIDE 2 MG PO TABS: ORAL_TABLET | ORAL | Status: DC

## 2023-12-06 MED ORDER — FLUOROURACIL 5 % EX CREA: TOPICAL_CREAM | Freq: Two times a day (BID) | CUTANEOUS | Status: AC

## 2023-12-06 MED ORDER — METFORMIN HCL 1000 MG PO TABS: 1000.0000 mg | ORAL_TABLET | Freq: Every day | ORAL | Status: DC

## 2023-12-06 MED ORDER — SITAGLIPTIN PHOSPHATE 100 MG PO TABS: 100.0000 mg | ORAL_TABLET | Freq: Every day | ORAL | 3 refills | Status: AC

## 2023-12-06 MED ORDER — SERTRALINE HCL 50 MG PO TABS: 50.0000 mg | ORAL_TABLET | Freq: Every day | ORAL | 3 refills | Status: AC

## 2023-12-06 MED ORDER — ROSUVASTATIN CALCIUM 10 MG PO TABS: 10.0000 mg | ORAL_TABLET | Freq: Every day | ORAL | 3 refills | Status: AC

## 2023-12-06 NOTE — Patient Instructions (Signed)
 Keep taking metformin  as is.  Increase januvia  to 100mg  per day. Stop glimepiride  for now.   If your sugar is persistently above 150 in the AM with the higher dose of januvia , then you could restart 1mg  glimepiride .   I would like to recheck your A1c at a visit in about 3 months.  You don't need to fast.  We can talk about options at the visit.    Update me as needed in the meantime.   Take care.  Glad to see you.

## 2023-12-06 NOTE — Progress Notes (Signed)
 Diabetes:  Using medications without difficulties: yes Hypoglycemic episodes: no Hyperglycemic episodes: up to 210 Feet problems: using orthotic shoes at baseline.  See exam.   Blood Sugars averaging: 145 in the AM to 210 PM.   eye exam within last year: yes A1c 8.2 improved from prior.    Elevated Cholesterol: Using medications without problems: yes Muscle aches: occ, d/w pt.  Diet compliance: yes Exercise: yes Prev labs d/w pt.    Mood d/w pt.  Compliant with sertraline .  No ADE on med.  Still motivated, ie skiing.  He noted less tolerance for multitasking compared to years ago.  He failed taper from SSRI prev.    Tdap 2021 PNA prev done.   Flu 2024 Covid prev done  Shingles prev done. RSV prev done.    Colonoscopy 2018.   Prostate cancer screening and PSA options (with potential risks and benefits of testing vs not testing) were discussed along with recent recs/guidelines.  He declined testing PSA at this point. Advance directive-daughter Augie Bliss designated if patient were incapacitated.  He wanted his body donated to [facility] if possible.   HCV screening prev done at [facility] in the early 2000s.    Normal stools and no tailbone pain now.    Presumed L triceps injury with fall on the ski lift.  Then fell in the garden last week, now with L shoulder pain with elevation, pulling, ROM, etc. He tripped, no syncope.    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  He was asking about treatment with liq N2 on the L arm lesions. I would defer given local irritation with recent 5FU use.    PMH and SH reviewed.   Vital signs, Meds and allergies reviewed.  ROS: Per HPI unless specifically indicated in ROS section   GEN: nad, alert and oriented HEENT: mucous membranes moist NECK: supple w/o LA CV: rrr.  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   No skin breakdown Callus on R 1st toe and L 2nd ray.  No ulceration.   Normal DP pulses Normal sensation to light tough and monofilament Nails normal

## 2023-12-07 DIAGNOSIS — M25519 Pain in unspecified shoulder: Secondary | ICD-10-CM | POA: Insufficient documentation

## 2023-12-07 NOTE — Assessment & Plan Note (Signed)
Continue sertraline as is.

## 2023-12-07 NOTE — Assessment & Plan Note (Signed)
 Tdap 2021 PNA prev done.   Flu 2024 Covid prev done  Shingles prev done. RSV prev done.    Colonoscopy 2018.   Prostate cancer screening and PSA options (with potential risks and benefits of testing vs not testing) were discussed along with recent recs/guidelines.  He declined testing PSA at this point. Advance directive-daughter Augie Bliss designated if patient were incapacitated.  He wanted his body donated to [facility] if possible.   HCV screening prev done at [facility] in the early 2000s.

## 2023-12-07 NOTE — Therapy (Unsigned)
 OUTPATIENT PHYSICAL THERAPY SHOULDER EVALUATION   Patient Name: Greg Rogers. MRN: [id number] DOB:01-25-1941, 83 y.o., male Today's Date: 12/09/2023  END OF SESSION:  PT End of Session - 12/08/23 1743     Visit Number 1    Number of Visits 12    Date for PT Re-Evaluation 02/07/24    Authorization Type MCR    PT Start Time 1700    PT Stop Time 1740    PT Time Calculation (min) 40 min    Activity Tolerance Patient tolerated treatment well    Behavior During Therapy WFL for tasks assessed/performed             Past Medical History:  Diagnosis Date   Abnormal GGT test    With otherwise normal work up.   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)    Persistent disorder of initiating or maintaining sleep    Pes planus    Rosacea    Snoring    Past Surgical History:  Procedure Laterality Date   APPENDECTOMY  1949   at age 84   CERVICAL FUSION  03/2016   C 3&4   Cyst removed from breast  1960   FOOT SURGERY  01/2009   Left   HERNIA REPAIR  1956   at age 65 (single)   HERNIA REPAIR  1960   (double)   Left 2nd toe removed  03/2017   SPINE SURGERY     TONSILLECTOMY AND ADENOIDECTOMY  ~ 1950   VASECTOMY  ~ 1984   Patient Active Problem List   Diagnosis Date Noted   Shoulder pain 12/07/2023   Change in stool 11/02/2023   Nasal lesion 06/02/2023   Occipital neuralgia 10/12/2020   Wound of right leg, initial encounter 07/03/2019   Healthcare maintenance 03/04/2017   Foot pain 07/22/2016   Cervical stenosis of spine 01/06/2016   thyroid  nodule-benign path on bx 01/2016 01/06/2016   Radicular pain in right arm 12/22/2015   Radicular leg pain 12/22/2015   Back pain 07/06/2015   Constipation 07/06/2015   FH: CAD (coronary artery disease) 05/23/2015   Family history of  ischemic heart disease and other diseases of the circulatory system 05/23/2015   Advance care planning 11/26/2014   Pain in joint, shoulder region 11/26/2014   Medicare annual wellness visit, subsequent 09/27/2012   GERD (gastroesophageal reflux disease) 07/05/2012   Irritability 03/29/2011   Advance directive on file 01/09/2011   Pain in joint, other site 01/04/2011   Arthropathy 09/02/2010   NEPHROLITHIASIS, HX OF 09/02/2010   Diabetes mellitus without complication (HCC) 09/01/2010   HLD (hyperlipidemia) 10/24/2009   SNORING 10/24/2009   FOOT PAIN, BILATERAL 10/06/2009   Pes planus 10/06/2009   Acquired flat foot 10/06/2009    PCP: Donnie Galea, MD  REFERRING PROVIDER: Donnie Galea, MD  REFERRING DIAG: M25.519 (ICD-10-CM) - Shoulder pain, unspecified chronicity, unspecified laterality  THERAPY DIAG:  Acute pain of left shoulder  Rationale for Evaluation and Treatment: Rehabilitation  ONSET DATE: 2 weeks  SUBJECTIVE:  SUBJECTIVE STATEMENT: Injured L arm from 2 consecutive falls onto L arm/shoulder.  Symptoms can wake him  Hand dominance: Right  PERTINENT HISTORY: Presumed L triceps injury with fall on the ski lift. Then fell in the garden last week, now with L shoulder pain with elevation, pulling, ROM, etc. He tripped, no syncope.  PAIN:  Are you having pain? Yes: NPRS scale: 4/10 Pain location: L arm/shoulder Pain description: ache, sharp, throb   Aggravating factors: rotational arm movement which stress biceps Relieving factors: OTC meds  PRECAUTIONS: None  RED FLAGS: None   WEIGHT BEARING RESTRICTIONS: No  FALLS:  Has patient fallen in last 6 months? Yes. Number of falls 2  OCCUPATION: retired  PLOF: Independent  PATIENT GOALS:To manage my shoulder symptoms  NEXT  MD VISIT:   OBJECTIVE:  Note: Objective measures were completed at Evaluation unless otherwise noted.  DIAGNOSTIC FINDINGS:  none  PATIENT SURVEYS:  Quick Dash 24/55    POSTURE: Forward L shoulder  UPPER EXTREMITY ROM: WNL  Active ROM Right eval Left eval  Shoulder flexion    Shoulder extension    Shoulder abduction    Shoulder adduction    Shoulder internal rotation    Shoulder external rotation    Elbow flexion    Elbow extension    Wrist flexion    Wrist extension    Wrist ulnar deviation    Wrist radial deviation    Wrist pronation    Wrist supination    (Blank rows = not tested)  UPPER EXTREMITY MMT:  MMT Right eval Left eval  Shoulder flexion  4+  Shoulder extension    Shoulder abduction  4+  Shoulder adduction    Shoulder internal rotation    Shoulder external rotation  4P!  Middle trapezius    Lower trapezius    Elbow flexion    Elbow extension    Wrist flexion    Wrist extension    Wrist ulnar deviation    Wrist radial deviation    Wrist pronation    Wrist supination    Grip strength (lbs)    (Blank rows = not tested)  SHOULDER SPECIAL TESTS: Impingement tests: Neer impingement test: positive  and Hawkins/Kennedy impingement test: positive  Rotator cuff assessment: Drop arm test: negative, Empty can test: positive , Belly press test: negative, and Hornblower's sign: negative Biceps assessment: Speed's test: positive   PALPATION:  TTP biceps tendon and                                                                                                                              TREATMENT DATE:  [facility] Adult PT Treatment:                                                DATE: 12/08/23 Eval and HEP  Self Care: Additional minutes spent for educating on updated Therapeutic Home Exercise Program as well as comparing current status to condition at start of care. This included exercises focusing on stretching, strengthening, with focus on eccentric  aspects. Long term goals include an improvement in range of motion, strength, endurance as well as avoiding reinjury. Patient's frequency would include in 1-2 times a day, 3-5 times a week for a duration of 6-12 weeks. Proper technique shown and discussed handout in great detail. All questions were discussed and addressed.      PATIENT EDUCATION: Education details: Discussed eval findings, rehab rationale and POC and patient is in agreement  Person educated: Patient Education method: Explanation Education comprehension: verbalized understanding and needs further education  HOME EXERCISE PROGRAM: Access Code: [email or web address] URL: [email or web address] Date: 12/08/2023 Prepared by: Caralee Morea  Exercises - Standing Shoulder Scaption  - 2-3 x daily - 5 x weekly - 1 sets - 15 reps - Standing Shoulder Horizontal Abduction with Resistance  - 2-3 x daily - 5 x weekly - 1 sets - 10 reps - Shoulder External Rotation and Scapular Retraction with Resistance  - 2-3 x daily - 5 x weekly - 1 sets - 10 reps  Patient Education - Shoulder Impingement  ASSESSMENT:  CLINICAL IMPRESSION: Patient is a 83 y.o. male who was seen today for physical therapy evaluation and treatment for L shoulder impingement.  Patient demonstrates AROM WNL, positive findings of impingement, biceps tendinitis and supraspinatus tendinitis.  L RC appear intact.  Weakness noted in scaption an ER due to pain.  No AC joint tenderness appreciated.  Patient considering cortisone injection to relieve symptoms.  OBJECTIVE IMPAIRMENTS: decreased activity tolerance, decreased knowledge of condition, decreased strength, impaired UE functional use, postural dysfunction, and pain.   ACTIVITY LIMITATIONS: carrying, lifting, sleeping, and bed mobility  PARTICIPATION LIMITATIONS: yard work and sleep  PERSONAL FACTORS: Age, Fitness, Past/current experiences, and 1 comorbidity: DM2  are also affecting patient's functional outcome.    REHAB POTENTIAL: Good  CLINICAL DECISION MAKING: Stable/uncomplicated  EVALUATION COMPLEXITY: Moderate   GOALS: Goals reviewed with patient? No    SHORT TERM GOALS=LONG TERM GOALS: Target date: 01/19/2024    Patient will score at least 16/55 on DASH to signify clinically meaningful improvement in functional abilities.   Baseline:  Goal status: INITIAL  2.  Patient will acknowledge 2/10 pain at least once during episode of care   Baseline: 4/10 Goal status: INITIAL  3.  Patient to demonstrate independence in HEP  Baseline:  [id number] Goal status: INITIAL  4.  Increase L shoulder strength to 5/5 Baseline:  MMT Right eval Left eval  Shoulder flexion  4+  Shoulder extension    Shoulder abduction  4+  Shoulder adduction    Shoulder internal rotation    Shoulder external rotation  4P!   Goal status: INITIAL    PLAN:  PT FREQUENCY: 1x/week  PT DURATION: 6 weeks  PLANNED INTERVENTIONS: 97164- PT Re-evaluation, 97110-Therapeutic exercises, 97530- Therapeutic activity, 97112- Neuromuscular re-education, 97535- Self Care, 14782- Manual therapy, Patient/Family education, Dry Needling, Joint mobilization, and DME instructions  PLAN FOR NEXT SESSION: HEP review and update, manual techniques as appropriate, aerobic tasks, ROM and flexibility activities, strengthening and PREs, TPDN, gait and balance training as needed     Eldon Greenland, PT 12/09/2023, 7:41 AM

## 2023-12-07 NOTE — Assessment & Plan Note (Signed)
 Refer for physical therapy.  The concern is for triceps versus rotator cuff irritation without an obvious full tear.  Discussed with patient.  Okay for outpatient follow-up.  Discussed pendulum swings in the meantime to maintain range of motion.

## 2023-12-07 NOTE — Assessment & Plan Note (Signed)
 Continue Crestor .  Previous labs discussed with patient.

## 2023-12-07 NOTE — Assessment & Plan Note (Signed)
 A1c 8.2 improved from prior.   D/w pt at OV.  Keep taking metformin  as is.  Increase januvia  to 100mg  per day. Stop glimepiride  for now.   If sugar is persistently above 150 in the AM with the higher dose of januvia , then you could restart 1mg  glimepiride .   I would like to recheck A1c at a visit in about 3 months.

## 2023-12-07 NOTE — Assessment & Plan Note (Signed)
  Advance directive-daughter Augie Bliss designated if patient were incapacitated.  He wanted his body donated to [facility] if possible.

## 2023-12-08 ENCOUNTER — Ambulatory Visit: Attending: Family Medicine

## 2023-12-08 ENCOUNTER — Encounter: Payer: Self-pay | Admitting: Family Medicine

## 2023-12-08 DIAGNOSIS — M25512 Pain in left shoulder: Secondary | ICD-10-CM | POA: Insufficient documentation

## 2023-12-08 DIAGNOSIS — M6281 Muscle weakness (generalized): Secondary | ICD-10-CM | POA: Diagnosis not present

## 2023-12-08 DIAGNOSIS — M25519 Pain in unspecified shoulder: Secondary | ICD-10-CM | POA: Insufficient documentation

## 2023-12-08 DIAGNOSIS — M7542 Impingement syndrome of left shoulder: Secondary | ICD-10-CM | POA: Diagnosis not present

## 2023-12-09 NOTE — Progress Notes (Signed)
 Agree.  Thanks.  Gwynda Leriche Vallarie Gauze, MD 12/09/23  8:07 AM

## 2023-12-12 NOTE — Therapy (Unsigned)
 OUTPATIENT PHYSICAL THERAPY TREATMENT NOTE   Patient Name: Greg Rogers. MRN: [id number] DOB:11-23-1940, 83 y.o., male Today's Date: 12/13/2023  END OF SESSION:  PT End of Session - 12/13/23 1614     Visit Number 2    Number of Visits 12    Date for PT Re-Evaluation 02/07/24    Authorization Type MCR    PT Start Time 1615    PT Stop Time 1655    PT Time Calculation (min) 40 min    Activity Tolerance Patient tolerated treatment well    Behavior During Therapy WFL for tasks assessed/performed              Past Medical History:  Diagnosis Date   Abnormal GGT test    With otherwise normal work up.   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)    Persistent disorder of initiating or maintaining sleep    Pes planus    Rosacea    Snoring    Past Surgical History:  Procedure Laterality Date   APPENDECTOMY  1949   at age 25   CERVICAL FUSION  03/2016   C 3&4   Cyst removed from breast  1960   FOOT SURGERY  01/2009   Left   HERNIA REPAIR  1956   at age 3 (single)   HERNIA REPAIR  1960   (double)   Left 2nd toe removed  03/2017   SPINE SURGERY     TONSILLECTOMY AND ADENOIDECTOMY  ~ 1950   VASECTOMY  ~ 1984   Patient Active Problem List   Diagnosis Date Noted   Shoulder pain 12/07/2023   Change in stool 11/02/2023   Nasal lesion 06/02/2023   Occipital neuralgia 10/12/2020   Wound of right leg, initial encounter 07/03/2019   Healthcare maintenance 03/04/2017   Foot pain 07/22/2016   Cervical stenosis of spine 01/06/2016   thyroid  nodule-benign path on bx 01/2016 01/06/2016   Radicular pain in right arm 12/22/2015   Radicular leg pain 12/22/2015   Back pain 07/06/2015   Constipation 07/06/2015   FH: CAD (coronary artery disease) 05/23/2015   Family history of ischemic  heart disease and other diseases of the circulatory system 05/23/2015   Advance care planning 11/26/2014   Pain in joint, shoulder region 11/26/2014   Medicare annual wellness visit, subsequent 09/27/2012   GERD (gastroesophageal reflux disease) 07/05/2012   Irritability 03/29/2011   Advance directive on file 01/09/2011   Pain in joint, other site 01/04/2011   Arthropathy 09/02/2010   NEPHROLITHIASIS, HX OF 09/02/2010   Diabetes mellitus without complication (HCC) 09/01/2010   HLD (hyperlipidemia) 10/24/2009   SNORING 10/24/2009   FOOT PAIN, BILATERAL 10/06/2009   Pes planus 10/06/2009   Acquired flat foot 10/06/2009    PCP: Donnie Galea, MD  REFERRING PROVIDER: Donnie Galea, MD  REFERRING DIAG: M25.519 (ICD-10-CM) - Shoulder pain, unspecified chronicity, unspecified laterality  THERAPY DIAG:  Acute pain of left shoulder  Impingement syndrome of left shoulder  Muscle weakness (generalized)  Rationale for Evaluation and Treatment: Rehabilitation  ONSET DATE: 2 weeks  SUBJECTIVE:  SUBJECTIVE STATEMENT: Cleared by PCP for CSI to L shoulder but symptoms much improved since PT session.  Wishes to continue with PT before undertaking CSI  Hand dominance: Right  PERTINENT HISTORY: Presumed L triceps injury with fall on the ski lift. Then fell in the garden last week, now with L shoulder pain with elevation, pulling, ROM, etc. He tripped, no syncope.  PAIN:  Are you having pain? Yes: NPRS scale: 4/10 Pain location: L arm/shoulder Pain description: ache, sharp, throb   Aggravating factors: rotational arm movement which stress biceps Relieving factors: OTC meds  PRECAUTIONS: None  RED FLAGS: None   WEIGHT BEARING RESTRICTIONS: No  FALLS:  Has patient fallen in last 6 months? Yes.  Number of falls 2  OCCUPATION: retired  PLOF: Independent  PATIENT GOALS: To manage my shoulder symptoms  NEXT MD VISIT:   OBJECTIVE:  Note: Objective measures were completed at Evaluation unless otherwise noted.  DIAGNOSTIC FINDINGS:  none  PATIENT SURVEYS:  Quick Dash 24/55    POSTURE: Forward L shoulder  UPPER EXTREMITY ROM: WNL  Active ROM Right eval Left eval  Shoulder flexion    Shoulder extension    Shoulder abduction    Shoulder adduction    Shoulder internal rotation    Shoulder external rotation    Elbow flexion    Elbow extension    Wrist flexion    Wrist extension    Wrist ulnar deviation    Wrist radial deviation    Wrist pronation    Wrist supination    (Blank rows = not tested)  UPPER EXTREMITY MMT:  MMT Right eval Left eval  Shoulder flexion  4+  Shoulder extension    Shoulder abduction  4+  Shoulder adduction    Shoulder internal rotation    Shoulder external rotation  4P!  Middle trapezius    Lower trapezius    Elbow flexion    Elbow extension    Wrist flexion    Wrist extension    Wrist ulnar deviation    Wrist radial deviation    Wrist pronation    Wrist supination    Grip strength (lbs)    (Blank rows = not tested)  SHOULDER SPECIAL TESTS: Impingement tests: Neer impingement test: positive  and Hawkins/Kennedy impingement test: positive  Rotator cuff assessment: Drop arm test: negative, Empty can test: positive , Belly press test: negative, and Hornblower's sign: negative Biceps assessment: Speed's test: positive   PALPATION:  TTP biceps tendon and                                                                                                                              TREATMENT DATE:  [facility] Adult PT Treatment:  DATE: 12/13/23 Therapeutic Exercise: Nustep L4 8 min Neuromuscular re-ed: Prone extension 500g ball 15x Prone flexion 500g ball 15x Prone hor abd 500g ball  15x Prone row 500g ball 15x Prone scaption 500g ball 15x Therapeutic Activity: Supine hor abd RTB 15x B, 15/15 unilaterally Supine press 500g ball 15x Supine flexion 500g ball 15x(unable due to pain) Seated scaption 500g ball 15x  [facility] Adult PT Treatment:                                                DATE: 12/08/23 Eval and HEP Self Care: Additional minutes spent for educating on updated Therapeutic Home Exercise Program as well as comparing current status to condition at start of care. This included exercises focusing on stretching, strengthening, with focus on eccentric aspects. Long term goals include an improvement in range of motion, strength, endurance as well as avoiding reinjury. Patient's frequency would include in 1-2 times a day, 3-5 times a week for a duration of 6-12 weeks. Proper technique shown and discussed handout in great detail. All questions were discussed and addressed.      PATIENT EDUCATION: Education details: Discussed eval findings, rehab rationale and POC and patient is in agreement  Person educated: Patient Education method: Explanation Education comprehension: verbalized understanding and needs further education  HOME EXERCISE PROGRAM: Access Code: [email or web address] URL: [email or web address] Date: 12/08/2023 Prepared by: Sanav Remer  Exercises - Standing Shoulder Scaption  - 2-3 x daily - 5 x weekly - 1 sets - 15 reps - Standing Shoulder Horizontal Abduction with Resistance  - 2-3 x daily - 5 x weekly - 1 sets - 10 reps - Shoulder External Rotation and Scapular Retraction with Resistance  - 2-3 x daily - 5 x weekly - 1 sets - 10 reps  Patient Education - Shoulder Impingement  ASSESSMENT:  CLINICAL IMPRESSION: First f/u session.  Symptoms much improved.  Progressed to strengthening with focus on posterior shoulder with exercises in prone.  Unable to tolerate supine flexion due to pain from soft tissue irritation, especially eccentric  component.  Patient is a 83 y.o. male who was seen today for physical therapy evaluation and treatment for L shoulder impingement.  Patient demonstrates AROM WNL, positive findings of impingement, biceps tendinitis and supraspinatus tendinitis.  L RC appear intact.  Weakness noted in scaption an ER due to pain.  No AC joint tenderness appreciated.  Patient considering cortisone injection to relieve symptoms.  OBJECTIVE IMPAIRMENTS: decreased activity tolerance, decreased knowledge of condition, decreased strength, impaired UE functional use, postural dysfunction, and pain.   ACTIVITY LIMITATIONS: carrying, lifting, sleeping, and bed mobility  PARTICIPATION LIMITATIONS: yard work and sleep  PERSONAL FACTORS: Age, Fitness, Past/current experiences, and 1 comorbidity: DM2  are also affecting patient's functional outcome.   REHAB POTENTIAL: Good  CLINICAL DECISION MAKING: Stable/uncomplicated  EVALUATION COMPLEXITY: Moderate   GOALS: Goals reviewed with patient? No    SHORT TERM GOALS=LONG TERM GOALS: Target date: 01/19/2024    Patient will score at least 16/55 on DASH to signify clinically meaningful improvement in functional abilities.   Baseline:  Goal status: INITIAL  2.  Patient will acknowledge 2/10 pain at least once during episode of care   Baseline: 4/10 Goal status: INITIAL  3.  Patient to demonstrate independence in HEP  Baseline:  [id number] Goal status: INITIAL  4.  Increase L shoulder strength to  5/5 Baseline:  MMT Right eval Left eval  Shoulder flexion  4+  Shoulder extension    Shoulder abduction  4+  Shoulder adduction    Shoulder internal rotation    Shoulder external rotation  4P!   Goal status: INITIAL    PLAN:  PT FREQUENCY: 1x/week  PT DURATION: 6 weeks  PLANNED INTERVENTIONS: 97164- PT Re-evaluation, 97110-Therapeutic exercises, 97530- Therapeutic activity, 97112- Neuromuscular re-education, 97535- Self Care, 60454- Manual therapy,  Patient/Family education, Dry Needling, Joint mobilization, and DME instructions  PLAN FOR NEXT SESSION: HEP review and update, manual techniques as appropriate, aerobic tasks, ROM and flexibility activities, strengthening and PREs, TPDN, gait and balance training as needed     Eldon Greenland, PT 12/13/2023, 4:58 PM

## 2023-12-13 ENCOUNTER — Ambulatory Visit

## 2023-12-13 DIAGNOSIS — M25512 Pain in left shoulder: Secondary | ICD-10-CM

## 2023-12-13 DIAGNOSIS — M7542 Impingement syndrome of left shoulder: Secondary | ICD-10-CM | POA: Diagnosis not present

## 2023-12-13 DIAGNOSIS — M25519 Pain in unspecified shoulder: Secondary | ICD-10-CM | POA: Diagnosis not present

## 2023-12-13 DIAGNOSIS — M6281 Muscle weakness (generalized): Secondary | ICD-10-CM | POA: Diagnosis not present

## 2023-12-14 ENCOUNTER — Ambulatory Visit: Payer: Medicare Other

## 2023-12-14 VITALS — Ht 67.0 in | Wt 151.0 lb

## 2023-12-14 DIAGNOSIS — Z0000 Encounter for general adult medical examination without abnormal findings: Secondary | ICD-10-CM

## 2023-12-14 NOTE — Patient Instructions (Signed)
 Greg Rogers , Thank you for taking time to come for your Medicare Wellness Visit. I appreciate your ongoing commitment to your health goals. Please review the following plan we discussed and let me know if I can assist you in the future.   Referrals/Orders/Follow-Ups/Clinician Recommendations: none  This is a list of the screening recommended for you and due dates:  Health Maintenance  Topic Date Due   COVID-19 Vaccine (6 - 2024-25 season) 12/22/2023*   Pneumonia Vaccine (2 of 2 - PCV) 12/06/2043*   Flu Shot  03/16/2024   Hemoglobin A1C  06/06/2024   Yearly kidney function blood test for diabetes  09/04/2024   Yearly kidney health urinalysis for diabetes  09/04/2024   Eye exam for diabetics  09/12/2024   Complete foot exam   12/05/2024   Medicare Annual Wellness Visit  12/13/2024   DTaP/Tdap/Td vaccine (4 - Td or Tdap) 09/23/2029   Zoster (Shingles) Vaccine  Completed   HPV Vaccine  Aged Out   Meningitis B Vaccine  Aged Out  *Topic was postponed. The date shown is not the original due date.    Advanced directives: (In Chart) A copy of your advanced directives are scanned into your chart should your provider ever need it.  Next Medicare Annual Wellness Visit scheduled for next year: Yes 12/14/24 @ 8:50am

## 2023-12-14 NOTE — Progress Notes (Signed)
 Please attest and cosign this visit due to patients primary care provider not being in the office at the time the visit was completed.    Subjective:   Greg Chadick. is a 83 y.o. who presents for a Medicare Wellness preventive visit.  Visit Complete: Virtual I connected with  Greg Rogers. on 12/14/23 by a audio enabled telemedicine application and verified that I am speaking with the correct person using two identifiers.  Patient Location: Home  Provider Location: Home Office  I discussed the limitations of evaluation and management by telemedicine. The patient expressed understanding and agreed to proceed.  Vital Signs: Because this visit was a virtual/telehealth visit, some criteria may be missing or patient reported. Any vitals not documented were not able to be obtained and vitals that have been documented are patient reported.  VideoDeclined- This patient declined Librarian, academic. Therefore the visit was completed with audio only.  Persons Participating in Visit: Patient.  AWV Questionnaire: Yes: Patient Medicare AWV questionnaire was completed by the patient on 12/12/23; I have confirmed that all information answered by patient is correct and no changes since this date.  Cardiac Risk Factors include: advanced age (>42men, >66 women);diabetes mellitus;dyslipidemia;male gender     Objective:    Today's Vitals   12/14/23 0857  Weight: 151 lb (68.5 kg)  Height: 5\' 7"  (1.702 m)   Body mass index is 23.65 kg/m.     12/14/2023    9:14 AM 12/13/2022    9:06 AM 12/09/2021    8:37 AM 09/23/2019    9:59 PM 02/28/2018   11:51 AM 02/21/2017    2:05 PM 12/17/2015    9:03 AM  Advanced Directives  Does Patient Have a Medical Advance Directive? Yes Yes Yes No Yes Yes Yes  Type of Estate agent of [place];Living will Healthcare Power of [place];Living will Healthcare Power of [employer] of [place];Living will  Healthcare Power of [place];Living will Healthcare Power of [place];Living will  Does patient want to make changes to medical advance directive?  No - Patient declined     No - Patient declined  Copy of Healthcare Power of Attorney in Chart? Yes - validated most recent copy scanned in chart (See row information) Yes - validated most recent copy scanned in chart (See row information) Yes - validated most recent copy scanned in chart (See row information)  Yes Yes No - copy requested  Would patient like information on creating a medical advance directive?    No - Patient declined       Current Medications (verified) Outpatient Encounter Medications as of 12/14/2023  Medication Sig   Accu-Chek FastClix Lancets MISC Use as needed to check sugar daily.  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   aspirin 81 MG EC tablet Take 81 mg by mouth daily.    B Complex-C-Folic Acid (SUPER B COMPLEX/FA/VIT C) TABS    Blood Glucose Monitoring Suppl (ONETOUCH VERIO) w/Device KIT Use to check blood sugar up to 3 times a day. Dx: E11.9   EPINEPHrine  0.3 mg/0.3 mL IJ SOAJ injection Inject 0.3 mLs (0.3 mg total) into the muscle once. (Patient taking differently: Inject 0.3 mg into the muscle as needed.)   fluorouracil  (EFUDEX ) 5 % cream Apply topically 2 (two) times daily.   glimepiride  (AMARYL ) 2 MG tablet 1 a day if sugar is persistently above 150.   glucose blood (ONETOUCH VERIO) test strip USE TO CHECK BLOOD SUGAR UP  TO 3 TIMES DAILY. Dx E11.9   metFORMIN  (GLUCOPHAGE ) 1000 MG tablet Take 1 tablet (1,000 mg total) by mouth daily with breakfast.   metFORMIN  (GLUCOPHAGE -XR) 500 MG 24 hr tablet Take 2 tablets (1,000 mg total) by mouth at bedtime.   mupirocin  ointment (BACTROBAN ) 2 % Place 1 Application into the nose 2 (two) times daily.   nitroGLYCERIN  (NITROSTAT ) 0.4 MG SL tablet Place 1 tablet (0.4 mg total) under the tongue every 5 (five) minutes as needed for chest pain (max 3 doses/15 min.call MD/to ER if used).    omeprazole (PRILOSEC) 20 MG capsule Take 20 mg by mouth daily as needed. As needed   rosuvastatin  (CRESTOR ) 10 MG tablet Take 1 tablet (10 mg total) by mouth daily.   sertraline  (ZOLOFT ) 50 MG tablet Take 1 tablet (50 mg total) by mouth daily.   sitaGLIPtin  (JANUVIA ) 100 MG tablet Take 1 tablet (100 mg total) by mouth daily.   No facility-administered encounter medications on file as of 12/14/2023.    Allergies (verified) Azo [phenazopyridine], Bee venom, Erythromycin, Fire ant (solenopsis [place]), Pneumococcal vaccine polyvalent, and Wasp venom   History: Past Medical History:  Diagnosis Date   Abnormal GGT test    With otherwise normal work up.   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)    Persistent disorder of initiating or maintaining sleep    Pes planus    Rosacea    Snoring    Past Surgical History:  Procedure Laterality Date   APPENDECTOMY  1949   at age 39   CERVICAL FUSION  03/2016   C 3&4   Cyst removed from breast  1960   FOOT SURGERY  01/2009   Left   HERNIA REPAIR  1956   at age 78 (single)   HERNIA REPAIR  1960   (double)   Left 2nd toe removed  03/2017   SPINE SURGERY     TONSILLECTOMY AND ADENOIDECTOMY  ~ 1950   VASECTOMY  ~ 1984   Family History  Problem Relation Age of Onset   Heart disease Father 71       First MI   Hyperlipidemia Father    Heart attack Father    Diabetes Brother    Hyperlipidemia Brother    Hypertension Brother    Heart disease Brother        CAD s/p stent   CAD Brother    Heart disease Brother    Heart disease Brother    Diabetes Sister        DM2   Hyperlipidemia Sister    Heart disease Sister    Arthritis Other    Diabetes Other    ADD / ADHD Son    Depression Sister    Prostate cancer Neg Hx    Colon cancer Neg Hx     Social History   Socioeconomic History   Marital status: Married    Spouse name: Not on file   Number of children: 4   Years of education: Not on file   Highest education level: Master's degree (e.g., MA, MS, MEng, MEd, MSW, MBA)  Occupational History   Occupation: Retired Nov. 2007 27-1/2 years of service    Employer: RETIRED    Comment: Paediatric nurse  Occupation: Works part time at [employer] job.  Tobacco Use   Smoking status: Never   Smokeless tobacco: Former    Types: Engineer, drilling   Vaping status: Never Used  Substance and Sexual Activity   Alcohol use: Not Currently    Comment: 1 glass wine daily   Drug use: No   Sexual activity: Never  Other Topics Concern   Not on file  Social History Narrative   Education:  Masters   Charity fundraiser at 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   4 kids, 2 are MD's   7 grandkids   [employer] and stays active, enjoys skiing, very active, walks at least 1/2 mile daily   Social Drivers of Corporate investment banker Strain: Low Risk  (12/14/2023)   Overall Financial Resource Strain (CARDIA)    Difficulty of Paying Living Expenses: Not hard at all  Food Insecurity: No Food Insecurity (12/14/2023)   Hunger Vital Sign    Worried About Running Out of Food in the Last Year: Never true    Ran Out of Food in the Last Year: Never true  Transportation Needs: No Transportation Needs (12/14/2023)   PRAPARE - Administrator, Civil Service (Medical): No    Lack of Transportation (Non-Medical): No  Physical Activity: Sufficiently Active (12/14/2023)   Exercise Vital Sign    Days of Exercise per Week: 7 days    Minutes of Exercise per Session: 40 min  Stress: No Stress Concern Present (12/14/2023)   [employer] of Occupational Health - Occupational Stress Questionnaire    Feeling of Stress : Not at all  Social Connections: Moderately Integrated (12/14/2023)   Social Connection and Isolation Panel  [NHANES]    Frequency of Communication with Friends and Family: Twice a week    Frequency of Social Gatherings with Friends and Family: Once a week    Attends Religious Services: Never    Database administrator or Organizations: Yes    Attends Engineer, structural: 1 to 4 times per year    Marital Status: Married    Tobacco Counseling Counseling given: Not Answered    Clinical Intake:  Pre-visit preparation completed: Yes  Pain : No/denies pain     BMI - recorded: 23.65 Nutritional Status: BMI of 19-24  Normal Nutritional Risks: None Diabetes: Yes CBG done?: Yes (BS 194, 206,196 all within at home (fingerstick)) CBG resulted in Enter/ Edit results?: No Did pt. bring in CBG monitor from home?: No  Lab Results  Component Value Date   HGBA1C 8.2 (A) 12/06/2023   HGBA1C 8.8 (H) 09/05/2023   HGBA1C 7.8 (A) 06/02/2023     How often do you need to have someone help you when you read instructions, pamphlets, or other written materials from your doctor or pharmacy?: 1 - Never  Interpreter Needed?: No  Comments: lives with wife Information entered by :: B.Adarius Tigges,LPN   Activities of Daily Living     12/12/2023   10:55 PM  In your present state of health, do you have any difficulty performing the following activities:  Hearing? 0  Vision? 0  Difficulty concentrating or making decisions? 1  Walking or climbing stairs? 0  Dressing or bathing? 0  Doing errands, shopping? 0  Preparing Food and eating ? N  Using the Toilet? N  In the past six months, have you accidently leaked urine? Y  Do you have problems with loss of bowel control? N  Managing your Medications? N  Managing your Finances? N  Housekeeping or managing your Housekeeping? N    Patient Care Team: Donnie Galea, MD as PCP - Stevens Eland, MD as Consulting Physician (Ophthalmology) Ivey Marlin as Consulting Physician (Dentistry) Cindra Cree, MD as Consulting  Physician (Ophthalmology)  Indicate any recent Medical Services you may have received from other than [facility] providers in the past year (date may be approximate).     Assessment:   This is a routine wellness examination for Greg Rogers.  Hearing/Vision screen Hearing Screening - Comments:: Pt says hearing is good Vision Screening - Comments:: Pt says he sees well with glasses Eye exam in Jan every yr-[facility]-Dr Bowen   Goals Addressed             This Visit's Progress    Patient Stated       12/14/23- I will continue to take medications as prescribed.      COMPLETED: Patient Stated   On track    Would like to ski more in [place]      Patient Stated   On track    12/14/23- Improve A1C     Patient Stated       Stay healthy and finish my cattle fence. I would like to ski again       Depression Screen     12/14/2023    9:11 AM 10/31/2023    3:59 PM 09/15/2023   12:28 PM 12/13/2022    9:05 AM 11/30/2022    2:11 PM 12/09/2021    8:42 AM 03/26/2021    3:25 PM  PHQ 2/9 Scores  PHQ - 2 Score 0 0 0 0 0 0 0  PHQ- 9 Score  0 0 0 0      Fall Risk     12/12/2023   10:55 PM 12/06/2023    1:53 PM 10/31/2023    3:58 PM 09/15/2023   12:28 PM 12/13/2022    9:01 AM  Fall Risk   Falls in the past year? 1 1 1  0 0  Number falls in past yr: 1 0 0 0 0  Injury with Fall? 1 1 1  0 0  Risk for fall due to : No Fall Risks History of fall(s) History of fall(s) No Fall Risks No Fall Risks  Follow up Education provided;Falls prevention discussed Falls evaluation completed Falls evaluation completed  Falls prevention discussed;Falls evaluation completed    MEDICARE RISK AT HOME:  Medicare Risk at Home Any stairs in or around the home?: (Patient-Rptd) Yes If so, are there any without handrails?: (Patient-Rptd) No Home free of loose throw rugs in walkways, pet beds, electrical cords, etc?: (Patient-Rptd) No Adequate lighting in your home to reduce risk of falls?: (Patient-Rptd) Yes Life alert?:  (Patient-Rptd) No Use of a cane, walker or w/c?: (Patient-Rptd) No Grab bars in the bathroom?: (Patient-Rptd) Yes Shower chair or bench in shower?: (Patient-Rptd) No Elevated toilet seat or a handicapped toilet?: (Patient-Rptd) No  TIMED UP AND GO:  Was the test performed?  No  Cognitive Function: 6CIT completed    02/28/2018   11:50 AM 02/21/2017    2:05 PM 12/17/2015    8:49 AM  MMSE - Mini Mental State Exam  Orientation to time 5 5 5   Orientation to Place 5 5 5   Registration 3 3 3   Attention/ Calculation 0 0 0  Recall 3 3 3   Language- name 2 objects 0 0 0  Language- repeat 1 1 1   Language-  follow 3 step command 3 3 3   Language- read & follow direction 0 0 0  Write a sentence 0 0 0  Copy design 0 0 0  Total score 20 20 20         12/14/2023    9:16 AM 12/13/2022    9:07 AM 12/09/2021    8:38 AM  6CIT Screen  What Year? 0 points 0 points 0 points  What month? 0 points 0 points 0 points  What time? 0 points 0 points 0 points  Count back from 20 0 points 0 points 0 points  Months in reverse 0 points 0 points 0 points  Repeat phrase 0 points 0 points 0 points  Total Score 0 points 0 points 0 points    Immunizations Immunization History  Administered Date(s) Administered   Fluad Quad(high Dose 65+) 04/27/2019   Fluad Trivalent(High Dose 65+) 04/21/2023   Influenza Whole 05/21/2013   Influenza, High Dose Seasonal PF 05/25/2018, 05/19/2021, 05/17/2022, 04/21/2023   Influenza,inj,Quad PF,6+ Mos 05/06/2017, 05/25/2018   Influenza-Unspecified 05/29/2014, 05/21/2015, 04/12/2016, 05/25/2018, 05/27/2020   Moderna Covid-19 Fall Seasonal Vaccine 30yrs & older 04/21/2023   Moderna Sars-Covid-2 Vaccination 09/04/2019, 10/03/2019, 06/13/2020, 01/16/2021, 04/21/2023   Pneumococcal Polysaccharide-23 08/17/2003   Rsv, Bivalent, Protein Subunit Rsvpref,pf Pattricia Bores) 06/06/2023   Td 08/16/2004, 09/26/2012   Tdap 09/24/2019   Zoster Recombinant(Shingrix) 03/23/2019, 06/05/2019     Screening Tests Health Maintenance  Topic Date Due   COVID-19 Vaccine (6 - 2024-25 season) 12/22/2023 (Originally 06/16/2023)   Pneumonia Vaccine 50+ Years old (2 of 2 - PCV) 12/06/2043 (Originally 08/16/2004)   INFLUENZA VACCINE  03/16/2024   HEMOGLOBIN A1C  06/06/2024   Diabetic kidney evaluation - eGFR measurement  09/04/2024   Diabetic kidney evaluation - Urine ACR  09/04/2024   OPHTHALMOLOGY EXAM  09/12/2024   FOOT EXAM  12/05/2024   Medicare Annual Wellness (AWV)  12/13/2024   DTaP/Tdap/Td (4 - Td or Tdap) 09/23/2029   Zoster Vaccines- Shingrix  Completed   HPV VACCINES  Aged Out   Meningococcal B Vaccine  Aged Out    Health Maintenance  There are no preventive care reminders to display for this patient.  Health Maintenance Items Addressed: None needed at this time  Additional Screening:  Vision Screening: Recommended annual ophthalmology exams for early detection of glaucoma and other disorders of the eye.  Dental Screening: Recommended annual dental exams for proper oral hygiene  Community Resource Referral / Chronic Care Management: CRR required this visit?  No   CCM required this visit?  No     Plan:     I have personally reviewed and noted the following in the patient's chart:   Medical and social history Use of alcohol, tobacco or illicit drugs  Current medications and supplements including opioid prescriptions. Patient is not currently taking opioid prescriptions. Functional ability and status Nutritional status Physical activity Advanced directives List of other physicians Hospitalizations, surgeries, and ER visits in previous 12 months Vitals Screenings to include cognitive, depression, and falls Referrals and appointments  In addition, I have reviewed and discussed with patient certain preventive protocols, quality metrics, and best practice recommendations. A written personalized care plan for preventive services as well as general  preventive health recommendations were provided to patient.     Nerissa Bannister, LPN   [id number]/61/[id number]   After Visit Summary: (MyChart) Due to this being a telephonic visit, the after visit summary with patients personalized plan was offered to patient via MyChart   Notes: Nothing  significant to report at this time.

## 2023-12-20 NOTE — Therapy (Unsigned)
 OUTPATIENT PHYSICAL THERAPY TREATMENT NOTE   Patient Name: Greg Rogers. MRN: [id number] DOB:[birth year], 83 y.o., male Today's Date: 12/21/2023  END OF SESSION:  PT End of Session - 12/21/23 1404     Visit Number 3    Number of Visits 12    Date for PT Re-Evaluation 02/07/24    Authorization Type MCR    PT Start Time 1400    PT Stop Time 1440    PT Time Calculation (min) 40 min    Activity Tolerance Patient tolerated treatment well    Behavior During Therapy WFL for tasks assessed/performed               Past Medical History:  Diagnosis Date   Abnormal GGT test    With otherwise normal work up.   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)    Persistent disorder of initiating or maintaining sleep    Pes planus    Rosacea    Snoring    Past Surgical History:  Procedure Laterality Date   APPENDECTOMY  1949   at age 46   CERVICAL FUSION  03/2016   C 3&4   Cyst removed from breast  1960   FOOT SURGERY  01/2009   Left   HERNIA REPAIR  1956   at age 65 (single)   HERNIA REPAIR  1960   (double)   Left 2nd toe removed  03/2017   SPINE SURGERY     TONSILLECTOMY AND ADENOIDECTOMY  ~ 1950   VASECTOMY  ~ 1984   Patient Active Problem List   Diagnosis Date Noted   Shoulder pain 12/07/2023   Change in stool 11/02/2023   Nasal lesion 06/02/2023   Occipital neuralgia 10/12/2020   Wound of right leg, initial encounter 07/03/2019   Healthcare maintenance 03/04/2017   Foot pain 07/22/2016   Cervical stenosis of spine 01/06/2016   thyroid  nodule-benign path on bx 01/2016 01/06/2016   Radicular pain in right arm 12/22/2015   Radicular leg pain 12/22/2015   Back pain 07/06/2015   Constipation 07/06/2015   FH: CAD (coronary artery disease) 05/23/2015   Family history of ischemic  heart disease and other diseases of the circulatory system 05/23/2015   Advance care planning 11/26/2014   Pain in joint, shoulder region 11/26/2014   Medicare annual wellness visit, subsequent 09/27/2012   GERD (gastroesophageal reflux disease) 07/05/2012   Irritability 03/29/2011   Advance directive on file 01/09/2011   Pain in joint, other site 01/04/2011   Arthropathy 09/02/2010   NEPHROLITHIASIS, HX OF 09/02/2010   Diabetes mellitus without complication (HCC) 09/01/2010   HLD (hyperlipidemia) 10/24/2009   SNORING 10/24/2009   FOOT PAIN, BILATERAL 10/06/2009   Pes planus 10/06/2009   Acquired flat foot 10/06/2009    PCP: Donnie Galea, MD  REFERRING PROVIDER: Donnie Galea, MD  REFERRING DIAG: M25.519 (ICD-10-CM) - Shoulder pain, unspecified chronicity, unspecified laterality  THERAPY DIAG:  Acute pain of left shoulder  Impingement syndrome of left shoulder  Muscle weakness (generalized)  Rationale for Evaluation and Treatment: Rehabilitation  ONSET DATE: 2 weeks  SUBJECTIVE:  SUBJECTIVE STATEMENT: Marvell Slider a few days ago and exacerbated shoulder pain.  No further loss of function noted just elevated pain levels.  More agreeable to CSI and will obtain one before next PT session. Hand dominance: Right  PERTINENT HISTORY: Presumed L triceps injury with fall on the ski lift. Then fell in the garden last week, now with L shoulder pain with elevation, pulling, ROM, etc. He tripped, no syncope.  PAIN:  Are you having pain? Yes: NPRS scale: 4/10 Pain location: L arm/shoulder Pain description: ache, sharp, throb   Aggravating factors: rotational arm movement which stress biceps Relieving factors: OTC meds  PRECAUTIONS: None  RED FLAGS: None   WEIGHT BEARING RESTRICTIONS: No  FALLS:   Has patient fallen in last 6 months? Yes. Number of falls 2  OCCUPATION: retired  PLOF: Independent  PATIENT GOALS: To manage my shoulder symptoms  NEXT MD VISIT:   OBJECTIVE:  Note: Objective measures were completed at Evaluation unless otherwise noted.  DIAGNOSTIC FINDINGS:  none  PATIENT SURVEYS:  Quick Dash 24/55    POSTURE: Forward L shoulder  UPPER EXTREMITY ROM: WNL  Active ROM Right eval Left eval  Shoulder flexion    Shoulder extension    Shoulder abduction    Shoulder adduction    Shoulder internal rotation    Shoulder external rotation    Elbow flexion    Elbow extension    Wrist flexion    Wrist extension    Wrist ulnar deviation    Wrist radial deviation    Wrist pronation    Wrist supination    (Blank rows = not tested)  UPPER EXTREMITY MMT:  MMT Right eval Left eval  Shoulder flexion  4+  Shoulder extension    Shoulder abduction  4+  Shoulder adduction    Shoulder internal rotation    Shoulder external rotation  4P!  Middle trapezius    Lower trapezius    Elbow flexion    Elbow extension    Wrist flexion    Wrist extension    Wrist ulnar deviation    Wrist radial deviation    Wrist pronation    Wrist supination    Grip strength (lbs)    (Blank rows = not tested)  SHOULDER SPECIAL TESTS: Impingement tests: Neer impingement test: positive  and Hawkins/Kennedy impingement test: positive  Rotator cuff assessment: Drop arm test: negative, Empty can test: positive , Belly press test: negative, and Hornblower's sign: negative Biceps assessment: Speed's test: positive   PALPATION:  TTP biceps tendon and                                                                                                                              TREATMENT DATE:  [facility] Adult PT Treatment:  DATE: 12/21/23 Therapeutic Exercise: Nustep L2 8 min Neuromuscular re-ed: Prone extension 500g ball 15x Prone  flexion 500g ball 15x Prone hor abd 500g ball 15x Prone row 500g ball 15x Prone scaption 500g ball 15x Therapeutic Activity: Supine hor abd YTB 15x B, 15/15 unilaterally Supine press 500g ball 15x Supine flexion 500g ball 15x (modified with elbow flexed) Seated scaption 500g ball 15x Scapular wall slides YTB 15x  [facility] Adult PT Treatment:                                                DATE: 12/13/23 Therapeutic Exercise: Nustep L4 8 min Neuromuscular re-ed: Prone extension 500g ball 15x Prone flexion 500g ball 15x Prone hor abd 500g ball 15x Prone row 500g ball 15x Prone scaption 500g ball 15x Therapeutic Activity: Supine hor abd RTB 15x B, 15/15 unilaterally Supine press 500g ball 15x Supine flexion 500g ball 15x(unable due to pain) Seated scaption 500g ball 15x  [facility] Adult PT Treatment:                                                DATE: 12/08/23 Eval and HEP Self Care: Additional minutes spent for educating on updated Therapeutic Home Exercise Program as well as comparing current status to condition at start of care. This included exercises focusing on stretching, strengthening, with focus on eccentric aspects. Long term goals include an improvement in range of motion, strength, endurance as well as avoiding reinjury. Patient's frequency would include in 1-2 times a day, 3-5 times a week for a duration of 6-12 weeks. Proper technique shown and discussed handout in great detail. All questions were discussed and addressed.      PATIENT EDUCATION: Education details: Discussed eval findings, rehab rationale and POC and patient is in agreement  Person educated: Patient Education method: Explanation Education comprehension: verbalized understanding and needs further education  HOME EXERCISE PROGRAM: Access Code: [email or web address] URL: [email or web address] Date: 12/08/2023 Prepared by: Ava Deguire  Exercises - Standing Shoulder Scaption  - 2-3 x daily - 5 x weekly - 1  sets - 15 reps - Standing Shoulder Horizontal Abduction with Resistance  - 2-3 x daily - 5 x weekly - 1 sets - 10 reps - Shoulder External Rotation and Scapular Retraction with Resistance  - 2-3 x daily - 5 x weekly - 1 sets - 10 reps  Patient Education - Shoulder Impingement  ASSESSMENT:  CLINICAL IMPRESSION: Arrives with increased shoulder pain following recent fall.  Able to complete all tasks with position and pacing modifications.  Patient has decided to undergo CSI ASAP and will hold therapy until then.  Patient is a 83 y.o. male who was seen today for physical therapy evaluation and treatment for L shoulder impingement.  Patient demonstrates AROM WNL, positive findings of impingement, biceps tendinitis and supraspinatus tendinitis.  L RC appear intact.  Weakness noted in scaption an ER due to pain.  No AC joint tenderness appreciated.  Patient considering cortisone injection to relieve symptoms.  OBJECTIVE IMPAIRMENTS: decreased activity tolerance, decreased knowledge of condition, decreased strength, impaired UE functional use, postural dysfunction, and pain.   ACTIVITY LIMITATIONS: carrying, lifting, sleeping, and bed mobility  PARTICIPATION LIMITATIONS: yard work and sleep  PERSONAL FACTORS: Age,  Fitness, Past/current experiences, and 1 comorbidity: DM2  are also affecting patient's functional outcome.   REHAB POTENTIAL: Good  CLINICAL DECISION MAKING: Stable/uncomplicated  EVALUATION COMPLEXITY: Moderate   GOALS: Goals reviewed with patient? No    SHORT TERM GOALS=LONG TERM GOALS: Target date: 01/19/2024    Patient will score at least 16/55 on DASH to signify clinically meaningful improvement in functional abilities.   Baseline: 24/55 Goal status: INITIAL  2.  Patient will acknowledge 2/10 pain at least once during episode of care   Baseline: 4/10 Goal status: INITIAL  3.  Patient to demonstrate independence in HEP  Baseline:  [id number] Goal status: INITIAL  4.   Increase L shoulder strength to 5/5 Baseline:  MMT Right eval Left eval  Shoulder flexion  4+  Shoulder extension    Shoulder abduction  4+  Shoulder adduction    Shoulder internal rotation    Shoulder external rotation  4P!   Goal status: INITIAL    PLAN:  PT FREQUENCY: 1x/week  PT DURATION: 6 weeks  PLANNED INTERVENTIONS: 97164- PT Re-evaluation, 97110-Therapeutic exercises, 97530- Therapeutic activity, 97112- Neuromuscular re-education, 97535- Self Care, 13086- Manual therapy, Patient/Family education, Dry Needling, Joint mobilization, and DME instructions  PLAN FOR NEXT SESSION: HEP review and update, manual techniques as appropriate, aerobic tasks, ROM and flexibility activities, strengthening and PREs, TPDN, gait and balance training as needed     Eldon Greenland, PT 12/21/2023, 3:06 PM

## 2023-12-21 ENCOUNTER — Ambulatory Visit: Attending: Family Medicine

## 2023-12-21 DIAGNOSIS — M7542 Impingement syndrome of left shoulder: Secondary | ICD-10-CM | POA: Diagnosis not present

## 2023-12-21 DIAGNOSIS — M6281 Muscle weakness (generalized): Secondary | ICD-10-CM | POA: Insufficient documentation

## 2023-12-21 DIAGNOSIS — M25512 Pain in left shoulder: Secondary | ICD-10-CM | POA: Diagnosis not present

## 2023-12-26 ENCOUNTER — Ambulatory Visit

## 2023-12-27 ENCOUNTER — Ambulatory Visit

## 2023-12-27 ENCOUNTER — Ambulatory Visit: Admitting: Family Medicine

## 2023-12-27 ENCOUNTER — Encounter: Payer: Self-pay | Admitting: Family Medicine

## 2023-12-27 ENCOUNTER — Other Ambulatory Visit: Payer: Self-pay

## 2023-12-27 VITALS — BP 148/80 | HR 76 | Ht 67.0 in | Wt 148.0 lb

## 2023-12-27 DIAGNOSIS — Z77018 Contact with and (suspected) exposure to other hazardous metals: Secondary | ICD-10-CM

## 2023-12-27 DIAGNOSIS — M25512 Pain in left shoulder: Secondary | ICD-10-CM | POA: Diagnosis not present

## 2023-12-27 DIAGNOSIS — G8929 Other chronic pain: Secondary | ICD-10-CM

## 2023-12-27 DIAGNOSIS — Z01818 Encounter for other preprocedural examination: Secondary | ICD-10-CM

## 2023-12-27 DIAGNOSIS — T1590XA Foreign body on external eye, part unspecified, unspecified eye, initial encounter: Secondary | ICD-10-CM

## 2023-12-27 NOTE — Therapy (Unsigned)
 OUTPATIENT PHYSICAL THERAPY TREATMENT NOTE   Patient Name: Greg Rogers. MRN: [id number] DOB:Oct 13, 1940, 83 y.o., male Today's Date: 12/29/2023  END OF SESSION:  PT End of Session - 12/29/23 1659     Visit Number 4    Number of Visits 12    Date for PT Re-Evaluation 02/07/24    Authorization Type MCR    PT Start Time 1700    PT Stop Time 1740    PT Time Calculation (min) 40 min    Activity Tolerance Patient tolerated treatment well    Behavior During Therapy WFL for tasks assessed/performed                Past Medical History:  Diagnosis Date   Abnormal GGT test    With otherwise normal work up.   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)    Persistent disorder of initiating or maintaining sleep    Pes planus    Rosacea    Snoring    Past Surgical History:  Procedure Laterality Date   APPENDECTOMY  1949   at age 79   CERVICAL FUSION  03/2016   C 3&4   Cyst removed from breast  1960   FOOT SURGERY  01/2009   Left   HERNIA REPAIR  1956   at age 82 (single)   HERNIA REPAIR  1960   (double)   Left 2nd toe removed  03/2017   SPINE SURGERY     TONSILLECTOMY AND ADENOIDECTOMY  ~ 1950   VASECTOMY  ~ 1984   Patient Active Problem List   Diagnosis Date Noted   Shoulder pain 12/07/2023   Change in stool 11/02/2023   Nasal lesion 06/02/2023   Occipital neuralgia 10/12/2020   Wound of right leg, initial encounter 07/03/2019   Healthcare maintenance 03/04/2017   Foot pain 07/22/2016   Cervical stenosis of spine 01/06/2016   thyroid  nodule-benign path on bx 01/2016 01/06/2016   Radicular pain in right arm 12/22/2015   Radicular leg pain 12/22/2015   Back pain 07/06/2015   Constipation 07/06/2015   FH: CAD (coronary artery disease) 05/23/2015   Family history of  ischemic heart disease and other diseases of the circulatory system 05/23/2015   Advance care planning 11/26/2014   Pain in joint, shoulder region 11/26/2014   Medicare annual wellness visit, subsequent 09/27/2012   GERD (gastroesophageal reflux disease) 07/05/2012   Irritability 03/29/2011   Advance directive on file 01/09/2011   Pain in joint, other site 01/04/2011   Arthropathy 09/02/2010   NEPHROLITHIASIS, HX OF 09/02/2010   Diabetes mellitus without complication (HCC) 09/01/2010   HLD (hyperlipidemia) 10/24/2009   SNORING 10/24/2009   FOOT PAIN, BILATERAL 10/06/2009   Pes planus 10/06/2009   Acquired flat foot 10/06/2009    PCP: Donnie Galea, MD  REFERRING PROVIDER: Donnie Galea, MD  REFERRING DIAG: M25.519 (ICD-10-CM) - Shoulder pain, unspecified chronicity, unspecified laterality  THERAPY DIAG:  Acute pain of left shoulder  Muscle weakness (generalized)  Impingement syndrome of left shoulder  Rationale for Evaluation and Treatment: Rehabilitation  ONSET DATE: 2 weeks  SUBJECTIVE:  SUBJECTIVE STATEMENT: Saw MD, no cortisone injection, xrays and MRI performed but no reports available.  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 Hand dominance: Right  PERTINENT HISTORY: Presumed L triceps injury with fall on the ski lift. Then fell in the garden last week, now with L shoulder pain with elevation, pulling, ROM, etc. He tripped, no syncope.  PAIN:  Are you having pain? Yes: NPRS scale: 4/10 Pain location: L arm/shoulder Pain description: ache, sharp, throb   Aggravating factors: rotational arm movement which stress biceps Relieving factors: OTC meds  PRECAUTIONS: None  RED FLAGS: None   WEIGHT BEARING RESTRICTIONS: No  FALLS:  Has patient fallen in last 6  months? Yes. Number of falls 2  OCCUPATION: retired  PLOF: Independent  PATIENT GOALS: To manage my shoulder symptoms  NEXT MD VISIT:   OBJECTIVE:  Note: Objective measures were completed at Evaluation unless otherwise noted.  DIAGNOSTIC FINDINGS:  none  PATIENT SURVEYS:  Quick Dash 24/55    POSTURE: Forward L shoulder  UPPER EXTREMITY ROM: WNL  Active ROM Right eval Left eval  Shoulder flexion    Shoulder extension    Shoulder abduction    Shoulder adduction    Shoulder internal rotation    Shoulder external rotation    Elbow flexion    Elbow extension    Wrist flexion    Wrist extension    Wrist ulnar deviation    Wrist radial deviation    Wrist pronation    Wrist supination    (Blank rows = not tested)  UPPER EXTREMITY MMT:  MMT Right eval Left eval  Shoulder flexion  4+  Shoulder extension    Shoulder abduction  4+  Shoulder adduction    Shoulder internal rotation    Shoulder external rotation  4P!  Middle trapezius    Lower trapezius    Elbow flexion    Elbow extension    Wrist flexion    Wrist extension    Wrist ulnar deviation    Wrist radial deviation    Wrist pronation    Wrist supination    Grip strength (lbs)    (Blank rows = not tested)  SHOULDER SPECIAL TESTS: Impingement tests: Neer impingement test: positive  and Hawkins/Kennedy impingement test: positive  Rotator cuff assessment: Drop arm test: negative, Empty can test: positive , Belly press test: negative, and Hornblower's sign: negative Biceps assessment: Speed's test: positive   PALPATION:  TTP biceps tendon and                                                                                                                              TREATMENT DATE:  [facility] Adult PT Treatment:  DATE: 12/29/23 Therapeutic Exercise: Nustep L4 8 min Therapeutic Activity: Scaption 15x Prone flexion/extension/horizontal abduction/Ws/Ys 15x  each  [facility] Adult PT Treatment:                                                DATE: 12/21/23 Therapeutic Exercise: Nustep L2 8 min Neuromuscular re-ed: Prone extension 500g ball 15x Prone flexion 500g ball 15x Prone hor abd 500g ball 15x Prone row 500g ball 15x Prone scaption 500g ball 15x Therapeutic Activity: Supine hor abd YTB 15x B, 15/15 unilaterally Supine press 500g ball 15x Supine flexion 500g ball 15x (modified with elbow flexed) Seated scaption 500g ball 15x Scapular wall slides YTB 15x  [facility] Adult PT Treatment:                                                DATE: 12/13/23 Therapeutic Exercise: Nustep L4 8 min Neuromuscular re-ed: Prone extension 500g ball 15x Prone flexion 500g ball 15x Prone hor abd 500g ball 15x Prone row 500g ball 15x Prone scaption 500g ball 15x Therapeutic Activity: Supine hor abd RTB 15x B, 15/15 unilaterally Supine press 500g ball 15x Supine flexion 500g ball 15x(unable due to pain) Seated scaption 500g ball 15x  [facility] Adult PT Treatment:                                                DATE: 12/08/23 Eval and HEP Self Care: Additional minutes spent for educating on updated Therapeutic Home Exercise Program as well as comparing current status to condition at start of care. This included exercises focusing on stretching, strengthening, with focus on eccentric aspects. Long term goals include an improvement in range of motion, strength, endurance as well as avoiding reinjury. Patient's frequency would include in 1-2 times a day, 3-5 times a week for a duration of 6-12 weeks. Proper technique shown and discussed handout in great detail. All questions were discussed and addressed.      PATIENT EDUCATION: Education details: Discussed eval findings, rehab rationale and POC and patient is in agreement  Person educated: Patient Education method: Explanation Education comprehension: verbalized understanding and needs further education  HOME EXERCISE  PROGRAM: Access Code: [email or web address] URL: [email or web address] Date: 12/08/2023 Prepared by: Dereke Neumann  Exercises - Standing Shoulder Scaption  - 2-3 x daily - 5 x weekly - 1 sets - 15 reps - Standing Shoulder Horizontal Abduction with Resistance  - 2-3 x daily - 5 x weekly - 1 sets - 10 reps - Shoulder External Rotation and Scapular Retraction with Resistance  - 2-3 x daily - 5 x weekly - 1 sets - 10 reps  Patient Education - Shoulder Impingement  ASSESSMENT:  CLINICAL IMPRESSION: Patient awaiting MRI and XR results.  Feels confident to pause OPPT until results noted.    Patient is a 83 y.o. male who was seen today for physical therapy evaluation and treatment for L shoulder impingement.  Patient demonstrates AROM WNL, positive findings of impingement, biceps tendinitis and supraspinatus tendinitis.  L RC appear intact.  Weakness noted in scaption an ER due to pain.  No  AC joint tenderness appreciated.  Patient considering cortisone injection to relieve symptoms.  OBJECTIVE IMPAIRMENTS: decreased activity tolerance, decreased knowledge of condition, decreased strength, impaired UE functional use, postural dysfunction, and pain.   ACTIVITY LIMITATIONS: carrying, lifting, sleeping, and bed mobility  PARTICIPATION LIMITATIONS: yard work and sleep  PERSONAL FACTORS: Age, Fitness, Past/current experiences, and 1 comorbidity: DM2 are also affecting patient's functional outcome.   REHAB POTENTIAL: Good  CLINICAL DECISION MAKING: Stable/uncomplicated  EVALUATION COMPLEXITY: Moderate   GOALS: Goals reviewed with patient? No    SHORT TERM GOALS=LONG TERM GOALS: Target date: 01/19/2024    Patient will score at least 16/55 on DASH to signify clinically meaningful improvement in functional abilities.   Baseline: 24/55 Goal status: INITIAL  2.  Patient will acknowledge 2/10 pain at least once during episode of care   Baseline: 4/10 Goal status: INITIAL  3.  Patient  to demonstrate independence in HEP  Baseline:  [id number] Goal status: INITIAL  4.  Increase L shoulder strength to 5/5 Baseline:  MMT Right eval Left eval  Shoulder flexion  4+  Shoulder extension    Shoulder abduction  4+  Shoulder adduction    Shoulder internal rotation    Shoulder external rotation  4P!   Goal status: INITIAL    PLAN:  PT FREQUENCY: 1x/week  PT DURATION: 6 weeks  PLANNED INTERVENTIONS: 97164- PT Re-evaluation, 97110-Therapeutic exercises, 97530- Therapeutic activity, 97112- Neuromuscular re-education, 97535- Self Care, 29528- Manual therapy, Patient/Family education, Dry Needling, Joint mobilization, and DME instructions  PLAN FOR NEXT SESSION: HEP review and update, manual techniques as appropriate, aerobic tasks, ROM and flexibility activities, strengthening and PREs, TPDN, gait and balance training as needed     Eldon Greenland, PT 12/29/2023, 5:45 PM

## 2023-12-27 NOTE — Progress Notes (Addendum)
 Greg Muck, PhD, LAT, ATC acting as a scribe for Greg Juniper, MD.  Greg Rogers. is a 83 y.o. male who presents to [facility] today for L shoulder pain ongoing since the end of March. He was getting on the ski lift, and suffered a fall, landing on his L side. He fell again about 2 weeks later, and then again a week after that. Location of pain varies.   Radiates: yes Aggravates: overhead motions, IR, removing shirt, eXt Treatments tried: PT, HEP  Pertinent review of systems: No fevers or chills  Relevant historical information: GERD.  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 He would consider surgery.  Exam:  BP (!) 148/80   Pulse 76   Ht 5\' 7"  (1.702 m)   Wt 148 lb (67.1 kg)   SpO2 98%   BMI 23.18 kg/m  General: Well Developed, well nourished, and in no acute distress.   MSK: Left shoulder normal-appearing Normal motion pain with abduction. Strength 4/5 abduction. Positive Hawkins and Neer's test. Negative Yergason's and speeds test.    Lab and Radiology Results  [facility] Ultrasound of: Left shoulder Concern retracted rotator cuff tear supraspinatus tendon.  Other rotator cuff tendons are intact.. Impression: Concern retracted supraspinatus rotator cuff tear   X-ray images left shoulder and foreign body eye obtained today personally and independently interpreted.  Left shoulder: Mild DJD.  No acute fractures.  Eye foreign body: No mobile metallic foreign bodies visible within the eye.  Await formal radiology review   Assessment and Plan: 83 y.o. male with left shoulder pain.  Pain occurred after falling on his shoulder about 2 months ago.  He has been working on home exercise program previously taught with physical therapy over the last 6 weeks without much benefit.  He still has pain and weakness and ultrasound findings  concerning for rotator cuff tear.  Plan on MRI for surgical planning and discussion.  He is 83 years old but is in great health and would consider surgery if needed.  Of note patient thinks in the past he may have had foreign metallic bodies in his eye from grinders or welders.  He cannot remember which eye it was in his eye no longer hurts.  After discussion we will obtain x-ray of eyes foreign body to rule out metallic objects prior to MRI.  PDMP not reviewed this encounter. Orders Placed This Encounter  Procedures   US  LIMITED JOINT SPACE STRUCTURES UP LEFT(NO LINKED CHARGES)    Reason for Exam (SYMPTOM  OR DIAGNOSIS REQUIRED):   left shoulder pain    Preferred imaging location?:   [facility] Shoulder Left    Standing Status:   Future    Number of Occurrences:   1    Expiration Date:   01/27/2024    Reason for Exam (SYMPTOM  OR DIAGNOSIS REQUIRED):   LEFT SHOULDER PAIN    Preferred imaging location?:   [facility] Foreign Body    Standing Status:   Future    Number of Occurrences:   1    Expiration Date:   12/26/2024    Reason for Exam (SYMPTOM  OR DIAGNOSIS REQUIRED):   suspected metal foreign body    Preferred imaging location?:   [facility]   No orders of the defined types were placed  in this encounter.    Discussed warning signs or symptoms. Please see discharge instructions. Patient expresses understanding.   The above documentation has been reviewed and is accurate and complete Greg Rogers, M.D.

## 2023-12-27 NOTE — Patient Instructions (Addendum)
 Thank you for coming in today.   Please get an Xray today before you leave   Probably moving to a MRI. Let's see what the xrays shows

## 2023-12-28 ENCOUNTER — Ambulatory Visit
Admission: RE | Admit: 2023-12-28 | Discharge: 2023-12-28 | Disposition: A | Discharge status: Home / Self Care | Source: Ambulatory Visit | Attending: Family Medicine | Admitting: Family Medicine

## 2023-12-28 ENCOUNTER — Encounter: Payer: Self-pay | Admitting: Family Medicine

## 2023-12-28 DIAGNOSIS — G8929 Other chronic pain: Secondary | ICD-10-CM

## 2023-12-29 ENCOUNTER — Encounter: Payer: Self-pay | Admitting: Family Medicine

## 2023-12-29 ENCOUNTER — Ambulatory Visit

## 2023-12-29 DIAGNOSIS — M6281 Muscle weakness (generalized): Secondary | ICD-10-CM

## 2023-12-29 DIAGNOSIS — M25512 Pain in left shoulder: Secondary | ICD-10-CM

## 2023-12-29 DIAGNOSIS — M7542 Impingement syndrome of left shoulder: Secondary | ICD-10-CM

## 2023-12-31 ENCOUNTER — Other Ambulatory Visit

## 2024-01-01 ENCOUNTER — Ambulatory Visit: Payer: Self-pay | Admitting: Family Medicine

## 2024-01-01 NOTE — Progress Notes (Signed)
 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

## 2024-01-01 NOTE — Progress Notes (Signed)
 Left shoulder MRI shows a large full-thickness tear of the rotator cuff tendon.  This looks like it has been going on for a while as you have some atrophy of the muscle of the tendon originates from.  There are other partial-thickness tears of the rotator cuff tendon.  There is medium arthritis in the main shoulder joint.  Would you like me to refer you to an orthopedic surgeon to talk about your surgical options?

## 2024-01-01 NOTE — Progress Notes (Signed)
 No evidence of metallic foreign bodies within the eyes.

## 2024-01-03 ENCOUNTER — Ambulatory Visit (INDEPENDENT_AMBULATORY_CARE_PROVIDER_SITE_OTHER): Admitting: Family Medicine

## 2024-01-03 VITALS — BP 132/68 | HR 68 | Ht 67.0 in | Wt 148.0 lb

## 2024-01-03 DIAGNOSIS — G8929 Other chronic pain: Secondary | ICD-10-CM

## 2024-01-03 DIAGNOSIS — M25512 Pain in left shoulder: Secondary | ICD-10-CM

## 2024-01-03 NOTE — Progress Notes (Signed)
 Greg Muck, PhD, LAT, ATC acting as a scribe for Greg Juniper, MD.  Greg Rogers. is a 83 y.o. male who presents to [facility] at [facility] today for f/u chronic left shoulder pain w/ MRI review. Pt was last seen by Dr. Alease Hunter on 12/27/23 and a MRI was ordered.   Today, pt reports L shoulder is feeling about the same. Still painful AROM. He is wondering what is his next step.  Dx imaging: 12/28/23 L shoulder MRI  12/27/23 L shoulder XR  Pertinent review of systems: No fevers or chills  Relevant historical information: Diabetes  Patient is a widower; his wife's been deceased for about a year and a half now.  Exam:  BP 132/68   Pulse 68   Ht 5\' 7"  (1.702 m)   Wt 148 lb (67.1 kg)   SpO2 99%   BMI 23.18 kg/m  General: Well Developed, well nourished, and in no acute distress.   MSK: Left shoulder: Range of motion abduction nearly intact.  Functional internal rotation lumbar spine external rotation full.  Strength reduced abduction.    Lab and Radiology Results  EXAM: MRI OF THE LEFT SHOULDER WITHOUT CONTRAST   TECHNIQUE: Multiplanar, multisequence MR imaging of the shoulder was performed. No intravenous contrast was administered.   COMPARISON:  Left shoulder radiographs 12/27/2023   FINDINGS: Despite efforts by the technologist and patient, mild-to-moderate motion artifact is present on today's exam and could not be eliminated. This reduces exam sensitivity and specificity.   Rotator cuff: There is a large full-thickness tear of the mid and anterior supraspinatus tendon footprint in a region measuring up to approximately 2.3 cm in AP dimension with up to 2.6 cm tendon retraction. Moderate anterior infraspinatus intermediate T2 signal tendinosis. Moderate anterior superior subscapularis partial-thickness insertional tear (axial series 106 images 13 through 15). The teres minor is intact.   Muscles: Moderate anterior supraspinatus and mild  superior subscapularis atrophy.   Biceps long head: The superior subscapularis partial-thickness tear allows the long head of the biceps tendon to be perched on the anterior superior aspect of the lesser tuberosity as the biceps tendon enters the bicipital groove (axial image 14). There is a small linear midsubstance tear within the deep aspect of the biceps tendon within the proximal bicipital groove (axial image 16).   Acromioclavicular Joint: There are mild-to-moderate degenerative changes of the acromioclavicular joint including joint space narrowing, subchondral marrow edema, and peripheral osteophytosis. Type III acromion, with mild downsloping of the anterolateral acromion.   Glenohumeral Joint: Moderate posterosuperior glenoid cartilage thinning and subchondral degenerative cystic change. Mild inferior medial humeral head cartilage thinning.   Labrum: Lack of intra-articular fluid and motion artifact limits evaluation of the glenoid labrum. There appears to be degenerative irregularity of the posterosuperior glenoid labrum.   Bones:  No acute fracture.   Other: None.   IMPRESSION: 1. Large full-thickness tear of the mid and anterior supraspinatus tendon footprint with up to 2.6 cm tendon retraction. Moderate anterior supraspinatus muscle atrophy. 2. Moderate anterior superior subscapularis partial-thickness insertional tear. Mild superior subscapularis muscle atrophy. 3. The superior subscapularis partial-thickness tear allows the long head of the biceps tendon to be perched on the anterior superior aspect of the lesser tuberosity as the biceps tendon enters the bicipital groove. There is a small linear midsubstance tear within the deep aspect of the biceps tendon within the proximal bicipital groove. 4. Mild-to-moderate degenerative changes of the acromioclavicular joint. Type III acromion, with mild downsloping  of the anterolateral acromion. 5. Moderate  posterosuperior glenoid cartilage thinning and subchondral degenerative cystic change.     Electronically Signed   By: Bertina Broccoli M.D.   On: 12/31/2023 18:53   I, Greg Rogers, personally (independently) visualized and performed the interpretation of the images attached in this note.      Assessment and Plan: 83 y.o. male with chronic left shoulder pain.  Pain started after a fall with skiing 2 to 3 months ago.  His pain and range of motion are both improving with physical therapy.  He still has residual pain and weakness especially with overhead motion.  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  He has partial tears of the subscapularis and biceps tendons which are contributory as well.  He is in a bit of a crossroads where surgery may be helpful.  If he chooses to have a rotator cuff repair it needs to be done ASAP as he is already getting atrophy of the retracted supraspinatus muscle.  He has enough arthritis that a reverse total shoulder replacement may be a better option.  However he is not hurting enough that I think either 1 of these surgeries makes a ton of sense.  Plan to refer to orthopedic surgery to have a informed discussion about his options.  Injection is probably reasonable beleaguered happy to do that myself or happy to let orthopedic surgery do it.   PDMP not reviewed this encounter. Orders Placed This Encounter  Procedures   Ambulatory referral to Orthopedic Surgery    Referral Priority:   Routine    Referral Type:   Surgical    Referral Reason:   Specialty Services Required    Referred to Provider:   Wilhelmenia Harada, MD    Requested Specialty:   Orthopedic Surgery    Number of Visits Requested:   1   No orders of the defined types were placed in this encounter.    Discussed warning signs or symptoms. Please see discharge instructions. Patient expresses  understanding.   The above documentation has been reviewed and is accurate and complete Greg Rogers, M.D.

## 2024-01-03 NOTE — Patient Instructions (Addendum)
 Thank you for coming in today.   I've referred you to Orthopedic Surgery.  Let us  know if you don't hear from them in one week.   Check back as needed

## 2024-01-06 ENCOUNTER — Ambulatory Visit (INDEPENDENT_AMBULATORY_CARE_PROVIDER_SITE_OTHER): Payer: Medicare Other | Admitting: Podiatry

## 2024-01-06 DIAGNOSIS — M21619 Bunion of unspecified foot: Secondary | ICD-10-CM

## 2024-01-06 DIAGNOSIS — E119 Type 2 diabetes mellitus without complications: Secondary | ICD-10-CM

## 2024-01-06 DIAGNOSIS — Z89422 Acquired absence of other left toe(s): Secondary | ICD-10-CM | POA: Diagnosis not present

## 2024-01-06 DIAGNOSIS — L84 Corns and callosities: Secondary | ICD-10-CM | POA: Diagnosis not present

## 2024-01-06 DIAGNOSIS — M216X2 Other acquired deformities of left foot: Secondary | ICD-10-CM

## 2024-01-06 NOTE — Progress Notes (Unsigned)
 Subjective:   Patient ID: Greg Rogers., male   DOB: 83 y.o.   MRN: [id number]   HPI Chief Complaint  Patient presents with   Nail Problem    Patient is here for diabetic foot care. Pt has callus on the bottom left foot.    83 year old male presents today for yearly diabetic foot exam.  States he is doing well and the callus has been causing pain on the bottom of his foot but no open lesions that he reports.  He is asked about new diabetic shoes, inserts.    Previous amputation left second toe.  Last A1c was 8.2 on December 06, 2023     Objective:  Physical Exam  General: AAO x3, NAD  Dermatological: Hyperkeratotic lesion left submetatarsal 2 without any underlying ulceration drainage or signs of infection but there is no open lesions noted today.  Vascular: Dorsalis Pedis artery and Posterior Tibial artery pedal pulses are 2/4 bilateral with immedate capillary fill time.  There is no pain with calf compression, swelling, warmth, erythema.   Neruologic: Grossly intact via light touch bilateral.   Musculoskeletal: Bunions present without significant pain.  There is prominent metatarsal head plantarly with atrophy the fat pad resulting in hyperkeratotic lesion.  Previous left second toe amputation noted.  Muscular strength 5/5 in all groups tested bilateral.  Gait: Unassisted, Nonantalgic.       Assessment:   Hyperkeratotic lesion, prominent metatarsal head left foot     Plan:  -Treatment options discussed including all alternatives, risks, and complications -Etiology of symptoms were discussed -Sharply debrided the hyperkeratotic lesion without any complications or bleeding as a courtesy.  Discussed moisturizer and offloading.  Discussed inserts to help offload.   -I do think he benefit from further diabetic shoes to help decrease pressure to help prevent ulcerations, further amputation.  Order sent to [facility]. -Daily foot inspection, glucose control  Charity Conch  DPM

## 2024-01-06 NOTE — Patient Instructions (Signed)

## 2024-01-12 ENCOUNTER — Other Ambulatory Visit: Payer: Self-pay | Admitting: Family Medicine

## 2024-01-16 ENCOUNTER — Telehealth (HOSPITAL_BASED_OUTPATIENT_CLINIC_OR_DEPARTMENT_OTHER): Payer: Self-pay | Admitting: Orthopaedic Surgery

## 2024-01-16 ENCOUNTER — Encounter: Payer: Self-pay | Admitting: Family Medicine

## 2024-01-16 ENCOUNTER — Other Ambulatory Visit (HOSPITAL_BASED_OUTPATIENT_CLINIC_OR_DEPARTMENT_OTHER): Payer: Self-pay

## 2024-01-16 ENCOUNTER — Ambulatory Visit (INDEPENDENT_AMBULATORY_CARE_PROVIDER_SITE_OTHER): Admitting: Orthopaedic Surgery

## 2024-01-16 DIAGNOSIS — S46011A Strain of muscle(s) and tendon(s) of the rotator cuff of right shoulder, initial encounter: Secondary | ICD-10-CM

## 2024-01-16 MED ORDER — OXYCODONE HCL 5 MG PO TABS
5.0000 mg | ORAL_TABLET | ORAL | 0 refills | Status: DC | PRN
  Filled 2024-01-16: qty 30, 5d supply, fill #0

## 2024-01-16 MED ORDER — ASPIRIN 325 MG PO TBEC
325.0000 mg | DELAYED_RELEASE_TABLET | Freq: Every day | ORAL | 0 refills | Status: DC
  Filled 2024-01-16: qty 14, 14d supply, fill #0

## 2024-01-16 MED ORDER — ACETAMINOPHEN 500 MG PO TABS
500.0000 mg | ORAL_TABLET | Freq: Three times a day (TID) | ORAL | 0 refills | Status: AC
  Filled 2024-01-16: qty 30, 10d supply, fill #0

## 2024-01-16 NOTE — Progress Notes (Signed)
 Chief Complaint: Shoulder pain     History of Present Illness:    Greg Rogers. is a 83 y.o. male right dominant male presents with ongoing left shoulder pain for the last several years.  He did have a fall in March while he was skiing in [place] .  He fell directly onto the shoulder.  He has been doing physical therapy since that time although he has had persistent positive drop arm in the left shoulder.  He is very active and enjoys gardening.  He does have nearly an acre in his house that he gardens on.  This has been persistently painful despite physical therapy and strengthening.  He has been seeing Dr. Alease Hunter and is referred here for further discussion    PMH/PSH/Family History/Social History/Meds/Allergies:    Past Medical History:  Diagnosis Date   Abnormal GGT test    With otherwise normal work up.   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)    Persistent disorder of initiating or maintaining sleep    Pes planus    Rosacea    Snoring    Past Surgical History:  Procedure Laterality Date   APPENDECTOMY  1949   at age 39   CERVICAL FUSION  03/2016   C 3&4   Cyst removed from breast  1960   FOOT SURGERY  01/2009   Left   HERNIA REPAIR  1956   at age 23 (single)   HERNIA REPAIR  1960   (double)   Left 2nd toe removed  03/2017   SPINE SURGERY     TONSILLECTOMY AND ADENOIDECTOMY  ~ 1950   VASECTOMY  ~ 1984   Social History   Socioeconomic History   Marital status: Married    Spouse name: Not on file   Number of children: 4   Years of education: Not on file   Highest education level: Master's degree (e.g., MA, MS, MEng, MEd, MSW, MBA)  Occupational History   Occupation: Retired Nov. 2007 27-1/2 years of service    Employer: RETIRED    Comment: Air traffic controller   Occupation: Works part time at a desk job.  Tobacco Use   Smoking status: Never   Smokeless tobacco: Former    Types: Engineer, drilling   Vaping status: Never Used  Substance and Sexual Activity   Alcohol use: Not Currently    Comment: 1 glass wine daily   Drug use: No   Sexual activity: Never  Other Topics Concern   Not on file  Social History Narrative   Education:  Masters   Charity fundraiser at 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   4 kids, 2 are MD's   7 grandkids   [employer] and stays active, enjoys skiing, very active, walks at least 1/2 mile daily   Social Drivers of Corporate investment banker Strain: Low Risk  (12/14/2023)   Overall Financial Resource Strain (CARDIA)    Difficulty of Paying Living Expenses: Not hard at all  Food Insecurity: No Food Insecurity (12/14/2023)   Hunger Vital Sign    Worried About Running Out of Food in the Last Year: Never true    Ran Out of Food in the Last Year: Never  true  Transportation Needs: No Transportation Needs (12/14/2023)   PRAPARE - Administrator, Civil Service (Medical): No    Lack of Transportation (Non-Medical): No  Physical Activity: Sufficiently Active (12/14/2023)   Exercise Vital Sign    Days of Exercise per Week: 7 days    Minutes of Exercise per Session: 40 min  Stress: No Stress Concern Present (12/14/2023)   [employer] of Occupational Health - Occupational Stress Questionnaire    Feeling of Stress : Not at all  Social Connections: Moderately Integrated (12/14/2023)   Social Connection and Isolation Panel [NHANES]    Frequency of Communication with Friends and Family: Twice a week    Frequency of Social Gatherings with Friends and Family: Once a week    Attends Religious Services: Never    Database administrator or Organizations: Yes    Attends Engineer, structural: 1 to 4 times per year    Marital Status: Married   Family History  Problem Relation Age of  Onset   Heart disease Father 69       First MI   Hyperlipidemia Father    Heart attack Father    Diabetes Brother    Hyperlipidemia Brother    Hypertension Brother    Heart disease Brother        CAD s/p stent   CAD Brother    Heart disease Brother    Heart disease Brother    Diabetes Sister        DM2   Hyperlipidemia Sister    Heart disease Sister    Arthritis Other    Diabetes Other    ADD / ADHD Son    Depression Sister    Prostate cancer Neg Hx    Colon cancer Neg Hx    Allergies  Allergen Reactions   Azo [Phenazopyridine] Other (See Comments)    diarrhea   Bee Venom    Erythromycin     Oral ulcers (but tolerates zithromax) No problem with erythromycin but does not tolerate the "old mycin drugs"   Animator (Solenopsis [place]) Swelling   Pneumococcal Vaccine Polyvalent     REACTION: rash   Wasp Venom     Exaggerated local reaction   Current Outpatient Medications  Medication Sig Dispense Refill   acetaminophen (TYLENOL) 500 MG tablet Take 1 tablet (500 mg total) by mouth every 8 (eight) hours for 10 days. 30 tablet 0   aspirin EC 325 MG tablet Take 1 tablet (325 mg total) by mouth daily. 14 tablet 0   oxyCODONE (ROXICODONE) 5 MG immediate release tablet Take 1 tablet (5 mg total) by mouth every 4 (four) hours as needed for severe pain (pain score 7-10) or breakthrough pain. 30 tablet 0   Accu-Chek FastClix Lancets MISC Use as needed to check sugar daily.  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 100 each 3   aspirin 81 MG EC tablet Take 81 mg by mouth daily.      B Complex-C-Folic Acid (SUPER B COMPLEX/FA/VIT C) TABS      Blood Glucose Monitoring Suppl (ONETOUCH VERIO) w/Device KIT Use to check blood sugar up to 3 times a day. Dx: E11.9 1 kit 0   EPINEPHrine  0.3 mg/0.3 mL IJ SOAJ injection Inject 0.3 mLs (0.3 mg total) into the muscle once. (Patient taking differently: Inject 0.3 mg into the muscle as needed.) 2 Device 1   fluorouracil  (EFUDEX ) 5 % cream Apply  topically 2 (two) times daily.  glimepiride  (AMARYL ) 2 MG tablet TAKE 1 TABLET BY MOUTH DAILY  WITH BREAKFAST 90 tablet 3   glucose blood (ONETOUCH VERIO) test strip USE TO CHECK BLOOD SUGAR UP TO 3 TIMES DAILY. Dx E11.9 100 strip 25   metFORMIN  (GLUCOPHAGE ) 1000 MG tablet Take 1 tablet (1,000 mg total) by mouth daily with breakfast.     metFORMIN  (GLUCOPHAGE -XR) 500 MG 24 hr tablet Take 2 tablets (1,000 mg total) by mouth at bedtime. 180 tablet 1   mupirocin  ointment (BACTROBAN ) 2 % Place 1 Application into the nose 2 (two) times daily.     nitroGLYCERIN  (NITROSTAT ) 0.4 MG SL tablet Place 1 tablet (0.4 mg total) under the tongue every 5 (five) minutes as needed for chest pain (max 3 doses/15 min.call MD/to ER if used). 25 tablet 3   omeprazole (PRILOSEC) 20 MG capsule Take 20 mg by mouth daily as needed. As needed     rosuvastatin  (CRESTOR ) 10 MG tablet Take 1 tablet (10 mg total) by mouth daily. 90 tablet 3   sertraline  (ZOLOFT ) 50 MG tablet Take 1 tablet (50 mg total) by mouth daily. 90 tablet 3   sitaGLIPtin  (JANUVIA ) 100 MG tablet Take 1 tablet (100 mg total) by mouth daily. 90 tablet 3   No current facility-administered medications for this visit.   No results found.  Review of Systems:   A ROS was performed including pertinent positives and negatives as documented in the HPI.  Physical Exam :   Constitutional: NAD and appears stated age Neurological: Alert and oriented Psych: Appropriate affect and cooperative There were no vitals taken for this visit.   Comprehensive Musculoskeletal Exam:    Left shoulder with positive drop arm in approximately 90 degrees of flexion.  Passively is able to forward elevate to 150 degrees with active to approximately 100.  External rotation at side is to 45 degrees.  Internal rotation is to L5 comfortably.  Negative drop arm, 4 out of 5 strength of supraspinatus testing   Imaging:   Xray (3 views left shoulder): There is some cephalad  migration of the humeral head  MRI (left shoulder): Full-thickness tear of the rotator cuff with retraction to the level of the glenoid and significant atrophy on T1 sagittal view   I personally reviewed and interpreted the radiographs.   Assessment and Plan:   83 y.o. male with left shoulder pain and muscular atrophy on T1 sagittal view consistent with rotator cuff arthropathy.  I did discuss treatment options.  Overall I do believe he would do quite well with her reverse shoulder arthroplasty due to the advanced nature of his rotator cuff injury.  That being said I did discuss risk limitations associated with this.  I did discuss the associated recovery timeframe.  We did specifically discuss that limited internal rotation would be 1 possibility although this would be exchanged for forward flexion strength.  After discussion of all this he is elected to proceed  -Plan for left shoulder reverse shoulder arthroplasty   After a lengthy discussion of treatment options, including risks, benefits, alternatives, complications of surgical and nonsurgical conservative options, the patient elected surgical repair.   The patient  is aware of the material risks  and complications including, but not limited to injury to adjacent structures, neurovascular injury, infection, numbness, bleeding, implant failure, thermal burns, stiffness, persistent pain, failure to heal, disease transmission from allograft, need for further surgery, dislocation, anesthetic risks, blood clots, risks of death,and others. The probabilities of surgical success and failure discussed  with patient given their particular co-morbidities.The time and nature of expected rehabilitation and recovery was discussed.The patient's questions were all answered preoperatively.  No barriers to understanding were noted. I explained the natural history of the disease process and Rx rationale.  I explained to the patient what I considered to be  reasonable expectations given their personal situation.  The final treatment plan was arrived at through a shared patient decision making process model.     I personally saw and evaluated the patient, and participated in the management and treatment plan.  Wilhelmenia Harada, MD Attending Physician, Orthopedic Surgery  This document was dictated using Dragon voice recognition software. A reasonable attempt at proof reading has been made to minimize errors.

## 2024-01-16 NOTE — Telephone Encounter (Signed)
 Patient wants to if he has the surgery is there a restriction on him skiing after his recovery

## 2024-01-17 ENCOUNTER — Other Ambulatory Visit: Payer: Self-pay | Admitting: Orthopaedic Surgery

## 2024-01-17 ENCOUNTER — Encounter: Payer: Self-pay | Admitting: Family Medicine

## 2024-01-17 MED ORDER — HYDROCODONE-ACETAMINOPHEN 5-325 MG PO TABS: 1.0000 | ORAL_TABLET | Freq: Four times a day (QID) | ORAL | 0 refills | Status: DC | PRN

## 2024-01-18 ENCOUNTER — Other Ambulatory Visit: Payer: Self-pay | Admitting: Family Medicine

## 2024-01-18 ENCOUNTER — Ambulatory Visit
Admission: RE | Admit: 2024-01-18 | Discharge: 2024-01-18 | Discharge status: Home / Self Care | Source: Ambulatory Visit | Attending: Orthopaedic Surgery

## 2024-01-18 DIAGNOSIS — S46011A Strain of muscle(s) and tendon(s) of the rotator cuff of right shoulder, initial encounter: Secondary | ICD-10-CM

## 2024-01-18 DIAGNOSIS — Z01818 Encounter for other preprocedural examination: Secondary | ICD-10-CM | POA: Diagnosis not present

## 2024-01-18 DIAGNOSIS — S43082A Other subluxation of left shoulder joint, initial encounter: Secondary | ICD-10-CM | POA: Diagnosis not present

## 2024-01-18 DIAGNOSIS — E119 Type 2 diabetes mellitus without complications: Secondary | ICD-10-CM

## 2024-01-18 DIAGNOSIS — M19012 Primary osteoarthritis, left shoulder: Secondary | ICD-10-CM | POA: Diagnosis not present

## 2024-01-18 NOTE — Telephone Encounter (Signed)
 Please be on the look out for incoming medical/preop forms.  Thanks.

## 2024-01-19 ENCOUNTER — Encounter (HOSPITAL_BASED_OUTPATIENT_CLINIC_OR_DEPARTMENT_OTHER): Payer: Self-pay | Admitting: Orthopaedic Surgery

## 2024-01-19 ENCOUNTER — Other Ambulatory Visit

## 2024-01-19 ENCOUNTER — Other Ambulatory Visit: Payer: Self-pay | Admitting: Family Medicine

## 2024-01-19 DIAGNOSIS — E119 Type 2 diabetes mellitus without complications: Secondary | ICD-10-CM

## 2024-01-19 LAB — HEMOGLOBIN A1C: Hgb A1c MFr Bld: 8.5 % — ABNORMAL HIGH (ref 4.6–6.5)

## 2024-01-20 ENCOUNTER — Telehealth: Payer: Self-pay | Admitting: Family Medicine

## 2024-01-20 ENCOUNTER — Other Ambulatory Visit: Payer: Self-pay

## 2024-01-20 MED ORDER — METFORMIN HCL 1000 MG PO TABS: 1000.0000 mg | ORAL_TABLET | Freq: Every day | ORAL | 3 refills | Status: AC

## 2024-01-20 NOTE — Telephone Encounter (Signed)
Patient notified that rx has been sent. 

## 2024-01-20 NOTE — Telephone Encounter (Signed)
 As of today paperwork not received

## 2024-01-20 NOTE — Telephone Encounter (Signed)
 Copied from CRM [id number]. Topic: Clinical - Prescription Issue >> Jan 20, 2024  2:02 PM Greg Rogers wrote: Reason for CRM: patient called stating the pharmacy told him that his metformin  was denied by the doctor and he want to know why. Patient only has four day left of this medication CB [phone]

## 2024-01-22 ENCOUNTER — Ambulatory Visit: Payer: Self-pay | Admitting: Family Medicine

## 2024-01-24 ENCOUNTER — Telehealth: Payer: Self-pay | Admitting: *Deleted

## 2024-01-24 NOTE — Telephone Encounter (Signed)
 I did reach out to patient and explained the reasoning behind the "hardstop" of <7.8 for his A1C coordinated by [facility]/Medicare and all Ortho MDs in the area. I explained what being a Shoulder bundle means and how we have to abide by certain guidelines/hardstops to give excellent care. He understood, but did keep asking what if he was more active and tightened up his diet. He has an appointment this week with his PCP and they will discuss. I explained also he is not even scheduled for surgery yet, so we may have some time for him to start trending downward with his A1C instead of trending upward as he has been. We discussed risk of infection/delayed healing, complications in general with an elevated A1C. He verbalized understanding and will discuss with Dr. Vallarie Gauze this week.

## 2024-01-26 ENCOUNTER — Encounter: Payer: Self-pay | Admitting: Family Medicine

## 2024-01-26 ENCOUNTER — Ambulatory Visit (INDEPENDENT_AMBULATORY_CARE_PROVIDER_SITE_OTHER): Admitting: Family Medicine

## 2024-01-26 VITALS — BP 128/62 | HR 73 | Temp 98.1°F | Ht 67.0 in | Wt 147.2 lb

## 2024-01-26 DIAGNOSIS — E119 Type 2 diabetes mellitus without complications: Secondary | ICD-10-CM | POA: Diagnosis not present

## 2024-01-26 MED ORDER — LANTUS SOLOSTAR 100 UNIT/ML ~~LOC~~ SOPN: 5.0000 [IU] | PEN_INJECTOR | Freq: Every day | SUBCUTANEOUS | 99 refills | Status: DC

## 2024-01-26 MED ORDER — INSULIN PEN NEEDLE 30G X 5 MM MISC: 3 refills | Status: AC

## 2024-01-26 NOTE — Patient Instructions (Addendum)
 Let me know if you can't get insulin pen filled.  I would start with 5 units per day.  Take it at night.  If your AM sugar is still above 150, then add 1 unit every 2-3 days.  If your AM sugar is 100-150, then no change in dose.  If below 100, then cut back 1 unit.   Update me as needed.  Recheck fructosamine in about 1 month.   Take care.  Glad to see you.

## 2024-01-26 NOTE — Progress Notes (Signed)
 D/w pt about sugar goals with A1c 7.6-7.8 range for surgery.  He is working on diet and exercise.  He is still on his baseline meds.  Sugar has been ~160-200 in the AMs.    He is dealing with shoulder pain in the meantime.  His goal is to have shoulder surgery as soon as feasibly possible.  Meds, vitals, and allergies reviewed.   ROS: Per HPI unless specifically indicated in ROS section   Nad Ncat Neck supple Rrr Ctab Abd soft, not ttp Skin well-perfused

## 2024-01-28 NOTE — Assessment & Plan Note (Signed)
 Discussed options.  Routine instructions given regarding aseptic injection with insulin  pen. Start with 5 units. If AM sugar is still above 150, then add 1 unit every 2-3 days.  If AM sugar is 100-150, then no change in dose.  If below 100, then cut back 1 unit.   Update me as needed.  Recheck fructosamine in about 1 month.   He agrees with plan. Routed to orthopedics as FYI.

## 2024-01-30 ENCOUNTER — Other Ambulatory Visit: Payer: Self-pay | Admitting: Family Medicine

## 2024-02-04 ENCOUNTER — Encounter (HOSPITAL_BASED_OUTPATIENT_CLINIC_OR_DEPARTMENT_OTHER): Payer: Self-pay | Admitting: Orthopaedic Surgery

## 2024-02-06 ENCOUNTER — Encounter: Payer: Self-pay | Admitting: Family Medicine

## 2024-02-09 NOTE — Telephone Encounter (Signed)
 You saw the patient on 6/12 are we awaiting the fructosamine results before we send the message to Dr. Genelle.

## 2024-02-09 NOTE — Telephone Encounter (Signed)
 Please send Dr. Genelle a message that assuming his fructosamine/A1c is acceptable, it would be reasonable to proceed with your surgery.

## 2024-02-14 ENCOUNTER — Ambulatory Visit: Admitting: Family Medicine

## 2024-02-14 ENCOUNTER — Other Ambulatory Visit (INDEPENDENT_AMBULATORY_CARE_PROVIDER_SITE_OTHER)

## 2024-02-14 DIAGNOSIS — E119 Type 2 diabetes mellitus without complications: Secondary | ICD-10-CM

## 2024-02-15 DIAGNOSIS — D692 Other nonthrombocytopenic purpura: Secondary | ICD-10-CM | POA: Diagnosis not present

## 2024-02-15 DIAGNOSIS — D1801 Hemangioma of skin and subcutaneous tissue: Secondary | ICD-10-CM | POA: Diagnosis not present

## 2024-02-15 DIAGNOSIS — W908XXS Exposure to other nonionizing radiation, sequela: Secondary | ICD-10-CM | POA: Diagnosis not present

## 2024-02-15 DIAGNOSIS — L570 Actinic keratosis: Secondary | ICD-10-CM | POA: Diagnosis not present

## 2024-02-15 DIAGNOSIS — L821 Other seborrheic keratosis: Secondary | ICD-10-CM | POA: Diagnosis not present

## 2024-02-15 DIAGNOSIS — L578 Other skin changes due to chronic exposure to nonionizing radiation: Secondary | ICD-10-CM | POA: Diagnosis not present

## 2024-02-16 ENCOUNTER — Telehealth: Payer: Self-pay | Admitting: Podiatry

## 2024-02-16 NOTE — Telephone Encounter (Signed)
 Patient calling to see if an order was sent for diabetic shoes.

## 2024-02-19 ENCOUNTER — Ambulatory Visit: Payer: Self-pay | Admitting: Family Medicine

## 2024-02-19 LAB — FRUCTOSAMINE: Fructosamine: 336 umol/L — ABNORMAL HIGH (ref 205–285)

## 2024-02-20 NOTE — Telephone Encounter (Signed)
 Sent!

## 2024-02-23 ENCOUNTER — Telehealth (HOSPITAL_BASED_OUTPATIENT_CLINIC_OR_DEPARTMENT_OTHER): Payer: Self-pay | Admitting: Orthopaedic Surgery

## 2024-02-23 ENCOUNTER — Encounter: Payer: Self-pay | Admitting: Family Medicine

## 2024-02-23 NOTE — Telephone Encounter (Signed)
 Patient wants to know when will his surgery be sch? I transferred him to April vm

## 2024-02-23 NOTE — Telephone Encounter (Signed)
 Once  I receive I will place in tray

## 2024-02-23 NOTE — Telephone Encounter (Signed)
 Patient scheduled for surgery on 03/27/24.

## 2024-02-24 ENCOUNTER — Telehealth: Payer: Self-pay

## 2024-02-24 NOTE — Telephone Encounter (Signed)
 Placed in your tray. It was just received today. I did notify patient that the earliest I will probably be able to fax back is Tuesday.

## 2024-02-24 NOTE — Telephone Encounter (Signed)
 I spoke with the patient and informed him we still do not have a surgery clearance from his PCP. Patient wil get in touch with PCP office to let them know we have to have this. In the meantime patient has been scheduled for surgery on 03/27/24.

## 2024-02-24 NOTE — Telephone Encounter (Signed)
I'll work on it.  Thanks.  

## 2024-02-24 NOTE — Telephone Encounter (Signed)
 Copied from CRM [id number]. Topic: General - Other >> Feb 24, 2024  4:25 PM Fonda T wrote: Reason for CRM: Patient calling to check status of surgery clearance form previously faxed to PCP for completion.   Per patient states surgery is scheduled for 03/27/24, and surgeon does not have completed form.   Called and spoke to office, per chart message, when receive form will place in provider box to be completed.  Per patient states, per surgeon nurse, form was faxed form again on yesterday, 02/23/24 and still no response.   Patient informed of above, verbalized understanding. Patient requesting a follow up call at [phone].

## 2024-02-26 NOTE — Telephone Encounter (Signed)
 I will work on the form when it comes in.  Please let me know when it comes in and please let him know when it is completed.  Thanks.

## 2024-02-27 NOTE — Telephone Encounter (Signed)
 Patient notified by phone that clearance paperwork has been faxed. Patient appreciative for the call

## 2024-03-01 ENCOUNTER — Telehealth (HOSPITAL_BASED_OUTPATIENT_CLINIC_OR_DEPARTMENT_OTHER): Payer: Self-pay | Admitting: Orthopaedic Surgery

## 2024-03-01 NOTE — Telephone Encounter (Signed)
 Patient would like to get a copy of post instructions before his surgery sent to his mychart best contact number [phone]

## 2024-03-05 ENCOUNTER — Other Ambulatory Visit (HOSPITAL_BASED_OUTPATIENT_CLINIC_OR_DEPARTMENT_OTHER): Payer: Self-pay | Admitting: Orthopaedic Surgery

## 2024-03-05 ENCOUNTER — Ambulatory Visit (HOSPITAL_BASED_OUTPATIENT_CLINIC_OR_DEPARTMENT_OTHER): Payer: Self-pay | Admitting: Orthopaedic Surgery

## 2024-03-05 DIAGNOSIS — S46011A Strain of muscle(s) and tendon(s) of the rotator cuff of right shoulder, initial encounter: Secondary | ICD-10-CM

## 2024-03-12 ENCOUNTER — Encounter (HOSPITAL_BASED_OUTPATIENT_CLINIC_OR_DEPARTMENT_OTHER): Payer: Self-pay | Admitting: Orthopaedic Surgery

## 2024-03-19 ENCOUNTER — Ambulatory Visit: Payer: Self-pay

## 2024-03-19 ENCOUNTER — Telehealth: Admitting: Physician Assistant

## 2024-03-19 DIAGNOSIS — W19XXXA Unspecified fall, initial encounter: Secondary | ICD-10-CM

## 2024-03-19 NOTE — Telephone Encounter (Signed)
 FYI Only or Action Required?: FYI only for provider.  Patient was last seen in primary care on 01/26/2024 by Greg Arlyss RAMAN, MD.  Called Nurse Triage reporting Pain.  Symptoms began a week ago.  Interventions attempted: OTC medications: tylenol .  Symptoms are: unchanged.  Triage Disposition: See PCP When Office is Open (Within 3 Days)  Patient/caregiver understands and will follow disposition?: Yes    Copied from CRM [id number]. Topic: Clinical - Red Word Triage >> Mar 19, 2024  8:23 AM Greg Rogers wrote: Red Word that prompted transfer to Nurse Triage: Pt is experiencing severe lower back pain Reason for Disposition  [1] MODERATE back pain (e.g., interferes with normal activities) AND [2] present > 3 days  Answer Assessment - Initial Assessment Questions 1. ONSET: When did the pain begin? (e.g., minutes, hours, days)     Greg Rogers 25th of July - 1 week later back started hurting 2. LOCATION: Where does it hurt? (upper, mid or lower back)     Low back, buttocks, and left leg 3. SEVERITY: How bad is the pain?  (e.g., Scale 1-10; mild, moderate, or severe)     7/10 4. PATTERN: Is the pain constant? (e.g., yes, no; constant, intermittent)      Comes and goes 5. RADIATION: Does the pain shoot into your legs or somewhere else?     Buttocks and left leg 6. CAUSE:  What do you think is causing the back pain?      Possible from fall 7. BACK OVERUSE:  Any recent lifting of heavy objects, strenuous work or exercise?     na 8. MEDICINES: What have you taken so far for the pain? (e.g., nothing, acetaminophen , NSAIDS)     OTC tylenol  9. NEUROLOGIC SYMPTOMS: Do you have any weakness, numbness, or problems with bowel/bladder control?     no 10. OTHER SYMPTOMS: Do you have any other symptoms? (e.g., fever, abdomen pain, burning with urination, blood in urine)       na 11. PREGNANCY: Is there any chance you are pregnant? When was your last menstrual period?        na  Protocols used: Back Pain-A-AH

## 2024-03-19 NOTE — Progress Notes (Signed)
  Because of your back pain and fall, I feel your condition warrants further evaluation and I recommend that you be seen in a face-to-face visit.   NOTE: There will be NO CHARGE for this E-Visit   If you are having a true medical emergency, please call 911.     For an urgent face to face visit, [place] has multiple urgent care centers for your convenience.  Click the link below for the full list of locations and hours, walk-in wait times, appointment scheduling options and driving directions:  Urgent Care - [place], [place], [place], [place], [place], [place]  [place]     Your MyChart E-visit questionnaire answers were reviewed by a board certified advanced clinical practitioner to complete your personal care plan based on your specific symptoms.    Thank you for using e-Visits.

## 2024-03-19 NOTE — Telephone Encounter (Signed)
 Noted, will see tomorrow at OV.

## 2024-03-20 ENCOUNTER — Ambulatory Visit: Admitting: Family Medicine

## 2024-03-20 ENCOUNTER — Encounter: Payer: Self-pay | Admitting: Family Medicine

## 2024-03-20 VITALS — BP 108/58 | HR 68 | Temp 98.3°F | Ht 67.0 in | Wt 148.4 lb

## 2024-03-20 DIAGNOSIS — M79605 Pain in left leg: Secondary | ICD-10-CM | POA: Diagnosis not present

## 2024-03-20 MED ORDER — LANTUS SOLOSTAR 100 UNIT/ML ~~LOC~~ SOPN: 7.0000 [IU] | PEN_INJECTOR | Freq: Every day | SUBCUTANEOUS | Status: AC

## 2024-03-20 NOTE — Progress Notes (Unsigned)
 He has surgery pending for next week.  Off aspirin  in the meantime.    Taking 7 units insulin  per day and sugar ~115.  Previous fructosamine discussed with patient.  He was cutting a poplar tree, was up on the ladder.  Using a chainsaw, about 7 ft up.  The trunk went down, then he fell onto the bole on the ground.  He fell on his back/L side.  No LOC.  Back was sore but that is better.    L buttock and L hamstring pain.  He felt better with leg wrap and taking tylenol .  He is improving in the meantime.  Meds, vitals, and allergies reviewed.   ROS: Per HPI unless specifically indicated in ROS section   Nad Ncat Neck supple, no LA Rrr Ctab Abd soft, not ttp Back not tender to palpation midline but left buttock is sore.  He does not have any bruising on the left hamstring or quad.  No sign of muscle/tendon rupture.  Range of motion still intact, able to bear weight.  Area was rewrapped with an Ace bandage which had been more comfortable for the patient.

## 2024-03-20 NOTE — Patient Instructions (Signed)
 Keep using the wrap is needed on your leg and ice as needed.  Update me as needed.  Take care.  Glad to see you.

## 2024-03-21 ENCOUNTER — Encounter (HOSPITAL_BASED_OUTPATIENT_CLINIC_OR_DEPARTMENT_OTHER)
Admission: RE | Admit: 2024-03-21 | Discharge: 2024-03-21 | Disposition: A | Discharge status: Home / Self Care | Source: Ambulatory Visit | Attending: Orthopaedic Surgery | Admitting: Orthopaedic Surgery

## 2024-03-21 ENCOUNTER — Other Ambulatory Visit: Payer: Self-pay

## 2024-03-21 ENCOUNTER — Encounter (HOSPITAL_BASED_OUTPATIENT_CLINIC_OR_DEPARTMENT_OTHER): Payer: Self-pay | Admitting: Orthopaedic Surgery

## 2024-03-21 DIAGNOSIS — Z01818 Encounter for other preprocedural examination: Secondary | ICD-10-CM | POA: Diagnosis not present

## 2024-03-21 DIAGNOSIS — M79606 Pain in leg, unspecified: Secondary | ICD-10-CM | POA: Insufficient documentation

## 2024-03-21 DIAGNOSIS — E119 Type 2 diabetes mellitus without complications: Secondary | ICD-10-CM | POA: Diagnosis not present

## 2024-03-21 LAB — BASIC METABOLIC PANEL WITH GFR
Anion gap: 10 (ref 5–15)
BUN: 16 mg/dL (ref 8–23)
CO2: 27 mmol/L (ref 22–32)
Calcium: 9.2 mg/dL (ref 8.9–10.3)
Chloride: 99 mmol/L (ref 98–111)
Creatinine, Ser: 0.83 mg/dL (ref 0.61–1.24)
GFR, Estimated: >60 mL/min (ref >60)
Glucose, Bld: 149 mg/dL — ABNORMAL HIGH (ref 70–99)
Potassium: 4.1 mmol/L (ref 3.5–5.1)
Sodium: 136 mmol/L (ref 135–145)

## 2024-03-21 LAB — SURGICAL PCR SCREEN
MRSA, PCR: NEGATIVE
Staphylococcus aureus: NEGATIVE

## 2024-03-21 NOTE — Assessment & Plan Note (Signed)
 Likely L hamstring strain (with discomfort along the distal portion of the body and the origin) but it doesn't appear that this would prevent him from having surgery.  Fall cautions discussed with patient.  Continue icing as needed.  Use Ace wrap as needed.  He agrees with plan.

## 2024-03-27 ENCOUNTER — Other Ambulatory Visit: Payer: Self-pay

## 2024-03-27 ENCOUNTER — Ambulatory Visit (HOSPITAL_BASED_OUTPATIENT_CLINIC_OR_DEPARTMENT_OTHER): Admitting: Anesthesiology

## 2024-03-27 ENCOUNTER — Encounter (HOSPITAL_BASED_OUTPATIENT_CLINIC_OR_DEPARTMENT_OTHER): Payer: Self-pay | Admitting: Orthopaedic Surgery

## 2024-03-27 ENCOUNTER — Ambulatory Visit (HOSPITAL_COMMUNITY)

## 2024-03-27 ENCOUNTER — Encounter (HOSPITAL_BASED_OUTPATIENT_CLINIC_OR_DEPARTMENT_OTHER)
Admission: RE | Disposition: A | Discharge status: Home / Self Care | Payer: Self-pay | Source: Home / Self Care | Attending: Orthopaedic Surgery

## 2024-03-27 ENCOUNTER — Ambulatory Visit (HOSPITAL_BASED_OUTPATIENT_CLINIC_OR_DEPARTMENT_OTHER)
Admission: RE | Admit: 2024-03-27 | Discharge: 2024-03-27 | Disposition: A | Discharge status: Home / Self Care | Attending: Orthopaedic Surgery | Admitting: Orthopaedic Surgery

## 2024-03-27 DIAGNOSIS — M1990 Unspecified osteoarthritis, unspecified site: Secondary | ICD-10-CM | POA: Diagnosis not present

## 2024-03-27 DIAGNOSIS — Z833 Family history of diabetes mellitus: Secondary | ICD-10-CM | POA: Insufficient documentation

## 2024-03-27 DIAGNOSIS — Z7984 Long term (current) use of oral hypoglycemic drugs: Secondary | ICD-10-CM | POA: Diagnosis not present

## 2024-03-27 DIAGNOSIS — S46012A Strain of muscle(s) and tendon(s) of the rotator cuff of left shoulder, initial encounter: Secondary | ICD-10-CM | POA: Diagnosis not present

## 2024-03-27 DIAGNOSIS — Z471 Aftercare following joint replacement surgery: Secondary | ICD-10-CM | POA: Diagnosis not present

## 2024-03-27 DIAGNOSIS — M75102 Unspecified rotator cuff tear or rupture of left shoulder, not specified as traumatic: Secondary | ICD-10-CM

## 2024-03-27 DIAGNOSIS — M12812 Other specific arthropathies, not elsewhere classified, left shoulder: Secondary | ICD-10-CM

## 2024-03-27 DIAGNOSIS — E119 Type 2 diabetes mellitus without complications: Secondary | ICD-10-CM | POA: Diagnosis not present

## 2024-03-27 DIAGNOSIS — F418 Other specified anxiety disorders: Secondary | ICD-10-CM | POA: Insufficient documentation

## 2024-03-27 DIAGNOSIS — S46011A Strain of muscle(s) and tendon(s) of the rotator cuff of right shoulder, initial encounter: Secondary | ICD-10-CM

## 2024-03-27 DIAGNOSIS — Z96612 Presence of left artificial shoulder joint: Secondary | ICD-10-CM | POA: Diagnosis not present

## 2024-03-27 DIAGNOSIS — Z8261 Family history of arthritis: Secondary | ICD-10-CM | POA: Insufficient documentation

## 2024-03-27 DIAGNOSIS — M75122 Complete rotator cuff tear or rupture of left shoulder, not specified as traumatic: Secondary | ICD-10-CM | POA: Diagnosis not present

## 2024-03-27 DIAGNOSIS — K219 Gastro-esophageal reflux disease without esophagitis: Secondary | ICD-10-CM | POA: Insufficient documentation

## 2024-03-27 DIAGNOSIS — Z01818 Encounter for other preprocedural examination: Secondary | ICD-10-CM

## 2024-03-27 DIAGNOSIS — G709 Myoneural disorder, unspecified: Secondary | ICD-10-CM | POA: Diagnosis not present

## 2024-03-27 DIAGNOSIS — G8918 Other acute postprocedural pain: Secondary | ICD-10-CM | POA: Diagnosis not present

## 2024-03-27 HISTORY — DX: Depression, unspecified: F32.A

## 2024-03-27 HISTORY — PX: REVERSE SHOULDER ARTHROPLASTY: SHX5054

## 2024-03-27 HISTORY — DX: Personal history of urinary calculi: Z87.442

## 2024-03-27 HISTORY — DX: Anxiety disorder, unspecified: F41.9

## 2024-03-27 LAB — GLUCOSE, CAPILLARY
Glucose-Capillary: 124 mg/dL — ABNORMAL HIGH (ref 70–99)
Glucose-Capillary: 123 mg/dL — ABNORMAL HIGH (ref 70–99)

## 2024-03-27 SURGERY — ARTHROPLASTY, SHOULDER, TOTAL, REVERSE
Anesthesia: General | Site: Shoulder | Laterality: Left

## 2024-03-27 MED ORDER — FENTANYL CITRATE (PF) 100 MCG/2ML IJ SOLN
INTRAMUSCULAR | Status: AC
Start: 2024-03-27 — End: 2024-03-27
  Filled 2024-03-27: qty 2

## 2024-03-27 MED ORDER — CEFAZOLIN SODIUM-DEXTROSE 2-4 GM/100ML-% IV SOLN
2.0000 g | INTRAVENOUS | Status: AC
  Administered 2024-03-27 (×2): 2 g via INTRAVENOUS

## 2024-03-27 MED ORDER — POVIDONE-IODINE 10 % EX SOLN
CUTANEOUS | Status: DC | PRN
  Administered 2024-03-27 (×2): 1 via TOPICAL

## 2024-03-27 MED ORDER — ONDANSETRON HCL 4 MG/2ML IJ SOLN: 4.0000 mg | Freq: Once | INTRAMUSCULAR | Status: DC | PRN

## 2024-03-27 MED ORDER — ONDANSETRON HCL 4 MG/2ML IJ SOLN
INTRAMUSCULAR | Status: AC
  Filled 2024-03-27: qty 2

## 2024-03-27 MED ORDER — LIDOCAINE 2% (20 MG/ML) 5 ML SYRINGE
INTRAMUSCULAR | Status: AC
  Filled 2024-03-27: qty 5

## 2024-03-27 MED ORDER — BUPIVACAINE HCL (PF) 0.5 % IJ SOLN
INTRAMUSCULAR | Status: DC | PRN
Start: 2024-03-27 — End: 2024-03-27
  Administered 2024-03-27 (×2): 15 mL via PERINEURAL

## 2024-03-27 MED ORDER — PROPOFOL 10 MG/ML IV BOLUS
INTRAVENOUS | Status: DC | PRN
Start: 2024-03-27 — End: 2024-03-27
  Administered 2024-03-27: 50 mg via INTRAVENOUS
  Administered 2024-03-27: 150 mg via INTRAVENOUS
  Administered 2024-03-27: 50 mg via INTRAVENOUS
  Administered 2024-03-27: 150 mg via INTRAVENOUS

## 2024-03-27 MED ORDER — CEFAZOLIN SODIUM-DEXTROSE 2-4 GM/100ML-% IV SOLN
INTRAVENOUS | Status: AC
  Filled 2024-03-27: qty 100

## 2024-03-27 MED ORDER — VANCOMYCIN HCL 1000 MG IV SOLR
INTRAVENOUS | Status: DC | PRN
  Administered 2024-03-27 (×2): 1000 mg via TOPICAL

## 2024-03-27 MED ORDER — ROCURONIUM BROMIDE 100 MG/10ML IV SOLN
INTRAVENOUS | Status: DC | PRN
  Administered 2024-03-27 (×2): 60 mg via INTRAVENOUS

## 2024-03-27 MED ORDER — ONDANSETRON HCL 4 MG/2ML IJ SOLN
INTRAMUSCULAR | Status: DC | PRN
  Administered 2024-03-27 (×2): 4 mg via INTRAVENOUS

## 2024-03-27 MED ORDER — MIDAZOLAM HCL 2 MG/2ML IJ SOLN
INTRAMUSCULAR | Status: AC
  Filled 2024-03-27: qty 2

## 2024-03-27 MED ORDER — FENTANYL CITRATE (PF) 100 MCG/2ML IJ SOLN
INTRAMUSCULAR | Status: DC | PRN
  Administered 2024-03-27 (×2): 50 ug via INTRAVENOUS

## 2024-03-27 MED ORDER — FENTANYL CITRATE (PF) 100 MCG/2ML IJ SOLN: 25.0000 ug | INTRAMUSCULAR | Status: DC | PRN

## 2024-03-27 MED ORDER — SUGAMMADEX SODIUM 200 MG/2ML IV SOLN
INTRAVENOUS | Status: DC | PRN
  Administered 2024-03-27 (×2): 200 mg via INTRAVENOUS

## 2024-03-27 MED ORDER — LACTATED RINGERS IV SOLN: INTRAVENOUS | Status: DC

## 2024-03-27 MED ORDER — PROPOFOL 500 MG/50ML IV EMUL
INTRAVENOUS | Status: DC | PRN
  Administered 2024-03-27 (×2): 100 ug/kg/min via INTRAVENOUS

## 2024-03-27 MED ORDER — GLYCOPYRROLATE 0.2 MG/ML IJ SOLN
INTRAMUSCULAR | Status: DC | PRN
  Administered 2024-03-27 (×2): .1 mg via INTRAVENOUS

## 2024-03-27 MED ORDER — ARTIFICIAL TEARS OPHTHALMIC OINT
TOPICAL_OINTMENT | OPHTHALMIC | Status: DC | PRN
  Administered 2024-03-27 (×2): 1 via OPHTHALMIC

## 2024-03-27 MED ORDER — GABAPENTIN 300 MG PO CAPS
300.0000 mg | ORAL_CAPSULE | Freq: Once | ORAL | Status: AC
  Administered 2024-03-27 (×2): 300 mg via ORAL

## 2024-03-27 MED ORDER — TRANEXAMIC ACID-NACL 1000-0.7 MG/100ML-% IV SOLN
1000.0000 mg | INTRAVENOUS | Status: AC
  Administered 2024-03-27 (×2): 1000 mg via INTRAVENOUS

## 2024-03-27 MED ORDER — EPHEDRINE SULFATE (PRESSORS) 50 MG/ML IJ SOLN
INTRAMUSCULAR | Status: DC | PRN
  Administered 2024-03-27 (×2): 10 mg via INTRAVENOUS

## 2024-03-27 MED ORDER — BUPIVACAINE LIPOSOME 1.3 % IJ SUSP
INTRAMUSCULAR | Status: DC | PRN
  Administered 2024-03-27 (×2): 10 mL via PERINEURAL

## 2024-03-27 MED ORDER — FENTANYL CITRATE (PF) 100 MCG/2ML IJ SOLN
50.0000 ug | Freq: Once | INTRAMUSCULAR | Status: AC
  Administered 2024-03-27 (×2): 50 ug via INTRAVENOUS

## 2024-03-27 MED ORDER — GABAPENTIN 300 MG PO CAPS
ORAL_CAPSULE | ORAL | Status: AC
  Filled 2024-03-27: qty 1

## 2024-03-27 MED ORDER — TRANEXAMIC ACID-NACL 1000-0.7 MG/100ML-% IV SOLN
INTRAVENOUS | Status: AC
  Filled 2024-03-27: qty 100

## 2024-03-27 MED ORDER — SODIUM CHLORIDE 0.9 % IV SOLN
INTRAVENOUS | Status: AC | PRN
  Administered 2024-03-27 (×2): 1000 mL

## 2024-03-27 MED ORDER — DEXAMETHASONE SODIUM PHOSPHATE 10 MG/ML IJ SOLN
INTRAMUSCULAR | Status: AC
  Filled 2024-03-27: qty 1

## 2024-03-27 MED ORDER — ACETAMINOPHEN 500 MG PO TABS
1000.0000 mg | ORAL_TABLET | Freq: Once | ORAL | Status: AC
  Administered 2024-03-27 (×2): 1000 mg via ORAL

## 2024-03-27 MED ORDER — DEXAMETHASONE SODIUM PHOSPHATE 10 MG/ML IJ SOLN
INTRAMUSCULAR | Status: DC | PRN
  Administered 2024-03-27 (×2): 5 mg via INTRAVENOUS

## 2024-03-27 MED ORDER — LIDOCAINE HCL (CARDIAC) PF 100 MG/5ML IV SOSY
PREFILLED_SYRINGE | INTRAVENOUS | Status: DC | PRN
Start: 2024-03-27 — End: 2024-03-27
  Administered 2024-03-27 (×2): 60 mg via INTRATRACHEAL

## 2024-03-27 MED ORDER — ACETAMINOPHEN 500 MG PO TABS
ORAL_TABLET | ORAL | Status: AC
  Filled 2024-03-27: qty 2

## 2024-03-27 MED ORDER — PROPOFOL 10 MG/ML IV BOLUS
INTRAVENOUS | Status: AC
  Filled 2024-03-27: qty 20

## 2024-03-27 MED ORDER — PHENYLEPHRINE HCL-NACL 20-0.9 MG/250ML-% IV SOLN
INTRAVENOUS | Status: DC | PRN
  Administered 2024-03-27 (×2): 40 ug/min via INTRAVENOUS

## 2024-03-27 SURGICAL SUPPLY — 57 items
AUGMENT BASEPLATE 15DEG 25 WDG (Joint) IMPLANT
BIT DRILL 3.2 PERIPHERAL SCREW (BIT) IMPLANT
BLADE SAW SGTL 73X25 THK (BLADE) ×1 IMPLANT
BLADE SURG 10 STRL SS (BLADE) IMPLANT
BLADE SURG 15 STRL LF DISP TIS (BLADE) IMPLANT
BRUSH SCRUB EZ PLAIN DRY (MISCELLANEOUS) IMPLANT
CHLORAPREP W/TINT 26 (MISCELLANEOUS) ×1 IMPLANT
CLSR STERI-STRIP ANTIMIC 1/2X4 (GAUZE/BANDAGES/DRESSINGS) IMPLANT
COOLER ICEMAN CLASSIC (MISCELLANEOUS) ×1 IMPLANT
COVER BACK TABLE 60X90IN (DRAPES) ×1 IMPLANT
COVER MAYO STAND STRL (DRAPES) ×1 IMPLANT
DERMABOND ADVANCED .7 DNX12 (GAUZE/BANDAGES/DRESSINGS) IMPLANT
DRAPE IMP U-DRAPE 54X76 (DRAPES) IMPLANT
DRAPE INCISE IOBAN 66X45 STRL (DRAPES) ×1 IMPLANT
DRAPE POUCH INSTRU U-SHP 10X18 (DRAPES) ×1 IMPLANT
DRAPE U-SHAPE 47X51 STRL (DRAPES) ×2 IMPLANT
DRAPE U-SHAPE 76X120 STRL (DRAPES) ×2 IMPLANT
DRSG AQUACEL AG ADV 3.5X10 (GAUZE/BANDAGES/DRESSINGS) ×1 IMPLANT
ELECTRODE BLDE 4.0 EZ CLN MEGD (MISCELLANEOUS) ×1 IMPLANT
ELECTRODE REM PT RTRN 9FT ADLT (ELECTROSURGICAL) ×1 IMPLANT
GAUZE XEROFORM 1X8 LF (GAUZE/BANDAGES/DRESSINGS) IMPLANT
GLENOSPHERE REV SHOULDER 36 (Joint) IMPLANT
GLOVE BIO SURGEON STRL SZ 6 (GLOVE) ×2 IMPLANT
GLOVE BIO SURGEON STRL SZ7.5 (GLOVE) ×2 IMPLANT
GLOVE BIOGEL PI IND STRL 6.5 (GLOVE) ×1 IMPLANT
GLOVE BIOGEL PI IND STRL 8 (GLOVE) ×1 IMPLANT
GOWN STRL REUS W/ TWL LRG LVL3 (GOWN DISPOSABLE) ×2 IMPLANT
GOWN STRL REUS W/TWL XL LVL3 (GOWN DISPOSABLE) ×1 IMPLANT
GUIDEWIRE GLENOID 2.5X220 (WIRE) IMPLANT
INSERT REVERSED HUMERAL SIZE 1 (Orthopedic Implant) IMPLANT
KIT STABILIZATION SHOULDER (MISCELLANEOUS) ×1 IMPLANT
MANIFOLD NEPTUNE II (INSTRUMENTS) ×1 IMPLANT
PACK BASIN DAY SURGERY FS (CUSTOM PROCEDURE TRAY) ×1 IMPLANT
PACK SHOULDER (CUSTOM PROCEDURE TRAY) ×2 IMPLANT
PAD COLD SHLDR WRAP-ON (PAD) ×1 IMPLANT
PIN GUIDE 3X75 SHOULDER (PIN) IMPLANT
RESTRAINT HEAD UNIVERSAL NS (MISCELLANEOUS) ×1 IMPLANT
SCREW 5.5X26 (Screw) IMPLANT
SCREW BONE INTRNL SM 7 (Screw) IMPLANT
SCREW PERIPHERAL 30 (Screw) IMPLANT
SET HNDPC FAN SPRY TIP SCT (DISPOSABLE) ×1 IMPLANT
SHEET MEDIUM DRAPE 40X70 STRL (DRAPES) ×1 IMPLANT
SLEEVE SCD COMPRESS KNEE MED (STOCKING) ×1 IMPLANT
SLEEVE SURGEON STRL (DRAPES) IMPLANT
SPIKE FLUID TRANSFER (MISCELLANEOUS) IMPLANT
SPONGE T-LAP 18X18 ~~LOC~~+RFID (SPONGE) ×1 IMPLANT
STEM HUMERAL PLUS SHORT SZ2+ (Orthopedic Implant) IMPLANT
SUCTION TUBE FRAZIER 10FR DISP (SUCTIONS) ×1 IMPLANT
SUT ETHIBOND 2 V 37 (SUTURE) ×1 IMPLANT
SUT ETHILON 3 0 PS 1 (SUTURE) IMPLANT
SUT MNCRL AB 3-0 PS2 27 (SUTURE) ×1 IMPLANT
SUT VIC AB 0 CT1 27XBRD ANBCTR (SUTURE) ×2 IMPLANT
SUT VIC AB 2-0 CT1 TAPERPNT 27 (SUTURE) ×2 IMPLANT
SUT VIC AB 3-0 SH 27X BRD (SUTURE) IMPLANT
SYR 50ML LL SCALE MARK (SYRINGE) ×1 IMPLANT
TOWEL GREEN STERILE FF (TOWEL DISPOSABLE) ×3 IMPLANT
TUBE SUCTION HIGH CAP CLEAR NV (SUCTIONS) ×1 IMPLANT

## 2024-03-27 NOTE — Interval H&P Note (Signed)
 History and Physical Interval Note:  03/27/2024 12:02 PM  Greg Rogers.  has presented today for surgery, with the diagnosis of LEFT SHOULDER ROTATOR CUFF ARTHROPLASTY.  The various methods of treatment have been discussed with the patient and family. After consideration of risks, benefits and other options for treatment, the patient has consented to  Procedure(s): ARTHROPLASTY, SHOULDER, TOTAL, REVERSE (Left) as a surgical intervention.  The patient's history has been reviewed, patient examined, no change in status, stable for surgery.  I have reviewed the patient's chart and labs.  Questions were answered to the patient's satisfaction.     [name]

## 2024-03-27 NOTE — Anesthesia Postprocedure Evaluation (Signed)
 Anesthesia Post Note  Patient: Greg Rogers.  Procedure(s) Performed: ARTHROPLASTY, SHOULDER, TOTAL, REVERSE (Left: Shoulder)     Patient location during evaluation: PACU Anesthesia Type: General Level of consciousness: awake and alert Pain management: pain level controlled Vital Signs Assessment: post-procedure vital signs reviewed and stable Respiratory status: spontaneous breathing, nonlabored ventilation and respiratory function stable Cardiovascular status: stable and blood pressure returned to baseline Anesthetic complications: no   No notable events documented.  Last Vitals:  Vitals:   03/27/24 1500 03/27/24 1515  BP: 132/66 136/63  Pulse: 74 88  Resp: (!) 21 16  Temp:  (!) 36.3 C  SpO2: 94% 98%    Last Pain:  Vitals:   03/27/24 1515  TempSrc:   PainSc: 0-No pain                 Debby FORBES Like

## 2024-03-27 NOTE — Transfer of Care (Signed)
 Immediate Anesthesia Transfer of Care Note  Patient: Greg Rogers.  Procedure(s) Performed: ARTHROPLASTY, SHOULDER, TOTAL, REVERSE (Left: Shoulder)  Patient Location: PACU  Anesthesia Type:GA combined with regional for post-op pain  Level of Consciousness: awake, alert , oriented, and patient cooperative  Airway & Oxygen Therapy: Patient Spontanous Breathing and Patient connected to face mask oxygen  Post-op Assessment: Report given to RN and Post -op Vital signs reviewed and unstable, Anesthesiologist notified  Post vital signs: Reviewed and stable  Last Vitals:  Vitals Value Taken Time  BP 130/76 03/27/24 14:46  Temp 36.3 C 03/27/24 14:41  Pulse 66 03/27/24 14:48  Resp 10 03/27/24 14:48  SpO2 100 % 03/27/24 14:48  Vitals shown include unfiled device data.  Last Pain:  Vitals:   03/27/24 1137  TempSrc: Temporal  PainSc: 0-No pain         Complications: No notable events documented.

## 2024-03-27 NOTE — H&P (Signed)
 Expand All Collapse All       Chief Complaint: Shoulder pain        History of Present Illness:      Greg Texidor. is a 83 y.o. male right dominant male presents with ongoing left shoulder pain for the last several years.  He did have a fall in March while he was skiing in [place] .  He fell directly onto the shoulder.  He has been doing physical therapy since that time although he has had persistent positive drop arm in the left shoulder.  He is very active and enjoys gardening.  He does have nearly an acre in his house that he gardens on.  This has been persistently painful despite physical therapy and strengthening.  He has been seeing Dr. Joane and is referred here for further discussion       PMH/PSH/Family History/Social History/Meds/Allergies:         Past Medical History:  Diagnosis Date   Abnormal GGT test      With otherwise normal work up.   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)     Persistent disorder of initiating or maintaining sleep     Pes planus     Rosacea     Snoring               Past Surgical History:  Procedure Laterality Date   APPENDECTOMY   1949    at age 89   CERVICAL FUSION   03/2016    C 3&4   Cyst removed from breast   1960   FOOT SURGERY   01/2009    Left   HERNIA REPAIR   1956    at age 88 (single)   HERNIA REPAIR   1960    (double)   Left 2nd toe removed   03/2017   SPINE SURGERY       TONSILLECTOMY AND ADENOIDECTOMY   ~ 1950   VASECTOMY   ~ 1984        Social History         Socioeconomic History   Marital status: Married      Spouse name: Not on file   Number of children: 4   Years of education: Not on file   Highest education level: Master's degree (e.g., MA, MS, MEng, MEd, MSW, MBA)  Occupational History    Occupation: Retired Nov. 2007 27-1/2 years of service      Employer: RETIRED      Comment: Paediatric nurse   Occupation: Works part time at a desk job.  Tobacco Use   Smoking status: Never   Smokeless tobacco: Former      Types: Engineer, drilling   Vaping status: Never Used  Substance and Sexual Activity   Alcohol use: Not Currently      Comment: 1 glass wine daily   Drug use: No   Sexual activity: Never  Other Topics Concern   Not on file  Social History Narrative    Education:  Masters    Charity fundraiser at 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    4 kids, 2 are MD's    7 grandkids  Gardens and stays active, enjoys skiing, very active, walks at least 1/2 mile daily    Social Drivers of Acupuncturist Strain: Low Risk  (12/14/2023)    Overall Financial Resource Strain (CARDIA)     Difficulty of Paying Living Expenses: Not hard at all  Food Insecurity: No Food Insecurity (12/14/2023)    Hunger Vital Sign     Worried About Running Out of Food in the Last Year: Never true     Ran Out of Food in the Last Year: Never true  Transportation Needs: No Transportation Needs (12/14/2023)    PRAPARE - Therapist, art (Medical): No     Lack of Transportation (Non-Medical): No  Physical Activity: Sufficiently Active (12/14/2023)    Exercise Vital Sign     Days of Exercise per Week: 7 days     Minutes of Exercise per Session: 40 min  Stress: No Stress Concern Present (12/14/2023)    [employer] of Occupational Health - Occupational Stress Questionnaire     Feeling of Stress : Not at all  Social Connections: Moderately Integrated (12/14/2023)    Social Connection and Isolation Panel [NHANES]     Frequency of Communication with Friends and Family: Twice a week     Frequency of Social Gatherings with Friends and Family: Once a week     Attends Religious Services: Never     Database administrator or  Organizations: Yes     Attends Engineer, structural: 1 to 4 times per year     Marital Status: Married         Family History  Problem Relation Age of Onset   Heart disease Father 14        First MI   Hyperlipidemia Father     Heart attack Father     Diabetes Brother     Hyperlipidemia Brother     Hypertension Brother     Heart disease Brother          CAD s/p stent   CAD Brother     Heart disease Brother     Heart disease Brother     Diabetes Sister          DM2   Hyperlipidemia Sister     Heart disease Sister     Arthritis Other     Diabetes Other     ADD / ADHD Son     Depression Sister     Prostate cancer Neg Hx     Colon cancer Neg Hx          Allergies       Allergies  Allergen Reactions   Azo [Phenazopyridine] Other (See Comments)      diarrhea   Bee Venom     Erythromycin        Oral ulcers (but tolerates zithromax) No problem with erythromycin but does not tolerate the old mycin drugs   Animator (Solenopsis [place]) Swelling   Pneumococcal Vaccine Polyvalent        REACTION: rash   Wasp Venom        Exaggerated local reaction            Current Outpatient Medications  Medication Sig Dispense Refill   acetaminophen  (TYLENOL ) 500 MG tablet Take 1 tablet (500 mg total) by mouth every 8 (eight) hours for 10 days. 30 tablet 0   aspirin  EC 325 MG tablet Take 1  tablet (325 mg total) by mouth daily. 14 tablet 0   oxyCODONE  (ROXICODONE ) 5 MG immediate release tablet Take 1 tablet (5 mg total) by mouth every 4 (four) hours as needed for severe pain (pain score 7-10) or breakthrough pain. 30 tablet 0   Accu-Chek FastClix Lancets MISC Use as needed to check sugar daily.  Diagnosis:  E11.9  Non insulin  dependent. 100 each 3   aspirin  81 MG EC tablet Take 81 mg by mouth daily.        B Complex-C-Folic Acid (SUPER B COMPLEX/FA/VIT C) TABS         Blood Glucose Monitoring Suppl (ONETOUCH VERIO) w/Device KIT Use to check blood sugar up to 3 times a day.  Dx: E11.9 1 kit 0   EPINEPHrine  0.3 mg/0.3 mL IJ SOAJ injection Inject 0.3 mLs (0.3 mg total) into the muscle once. (Patient taking differently: Inject 0.3 mg into the muscle as needed.) 2 Device 1   fluorouracil  (EFUDEX ) 5 % cream Apply topically 2 (two) times daily.       glimepiride  (AMARYL ) 2 MG tablet TAKE 1 TABLET BY MOUTH DAILY  WITH BREAKFAST 90 tablet 3   glucose blood (ONETOUCH VERIO) test strip USE TO CHECK BLOOD SUGAR UP TO 3 TIMES DAILY. Dx E11.9 100 strip 25   metFORMIN  (GLUCOPHAGE ) 1000 MG tablet Take 1 tablet (1,000 mg total) by mouth daily with breakfast.       metFORMIN  (GLUCOPHAGE -XR) 500 MG 24 hr tablet Take 2 tablets (1,000 mg total) by mouth at bedtime. 180 tablet 1   mupirocin  ointment (BACTROBAN ) 2 % Place 1 Application into the nose 2 (two) times daily.       nitroGLYCERIN  (NITROSTAT ) 0.4 MG SL tablet Place 1 tablet (0.4 mg total) under the tongue every 5 (five) minutes as needed for chest pain (max 3 doses/15 min.call MD/to ER if used). 25 tablet 3   omeprazole (PRILOSEC) 20 MG capsule Take 20 mg by mouth daily as needed. As needed       rosuvastatin  (CRESTOR ) 10 MG tablet Take 1 tablet (10 mg total) by mouth daily. 90 tablet 3   sertraline  (ZOLOFT ) 50 MG tablet Take 1 tablet (50 mg total) by mouth daily. 90 tablet 3   sitaGLIPtin  (JANUVIA ) 100 MG tablet Take 1 tablet (100 mg total) by mouth daily. 90 tablet 3      No current facility-administered medications for this visit.      Imaging Results (Last 48 hours)  No results found.     Review of Systems:   A ROS was performed including pertinent positives and negatives as documented in the HPI.   Physical Exam :   Constitutional: NAD and appears stated age Neurological: Alert and oriented Psych: Appropriate affect and cooperative There were no vitals taken for this visit.    Comprehensive Musculoskeletal Exam:     Left shoulder with positive drop arm in approximately 90 degrees of flexion.  Passively is able  to forward elevate to 150 degrees with active to approximately 100.  External rotation at side is to 45 degrees.  Internal rotation is to L5 comfortably.  Negative drop arm, 4 out of 5 strength of supraspinatus testing     Imaging:   Xray (3 views left shoulder): There is some cephalad migration of the humeral head   MRI (left shoulder): Full-thickness tear of the rotator cuff with retraction to the level of the glenoid and significant atrophy on T1 sagittal view     I personally  reviewed and interpreted the radiographs.     Assessment and Plan:   83 y.o. male with left shoulder pain and muscular atrophy on T1 sagittal view consistent with rotator cuff arthropathy.  I did discuss treatment options.  Overall I do believe he would do quite well with her reverse shoulder arthroplasty due to the advanced nature of his rotator cuff injury.  That being said I did discuss risk limitations associated with this.  I did discuss the associated recovery timeframe.  We did specifically discuss that limited internal rotation would be 1 possibility although this would be exchanged for forward flexion strength.  After discussion of all this he is elected to proceed   -Plan for left shoulder reverse shoulder arthroplasty     After a lengthy discussion of treatment options, including risks, benefits, alternatives, complications of surgical and nonsurgical conservative options, the patient elected surgical repair.    The patient  is aware of the material risks  and complications including, but not limited to injury to adjacent structures, neurovascular injury, infection, numbness, bleeding, implant failure, thermal burns, stiffness, persistent pain, failure to heal, disease transmission from allograft, need for further surgery, dislocation, anesthetic risks, blood clots, risks of death,and others. The probabilities of surgical success and failure discussed with patient given their particular co-morbidities.The  time and nature of expected rehabilitation and recovery was discussed.The patient's questions were all answered preoperatively.  No barriers to understanding were noted. I explained the natural history of the disease process and Rx rationale.  I explained to the patient what I considered to be reasonable expectations given their personal situation.  The final treatment plan was arrived at through a shared patient decision making process model.         I personally saw and evaluated the patient, and participated in the management and treatment plan.   Elspeth Parker, MD Attending Physician, Orthopedic Surgery   This document was dictated using Dragon voice recognition software. A reasonable attempt at proof reading has been made to minimize errors.

## 2024-03-27 NOTE — Anesthesia Procedure Notes (Signed)
 Anesthesia Regional Block: Interscalene brachial plexus block   Pre-Anesthetic Checklist: , timeout performed,  Correct Patient, Correct Site, Correct Laterality,  Correct Procedure, Correct Position, site marked,  Risks and benefits discussed,  Surgical consent,  Pre-op evaluation,  At surgeon's request and post-op pain management  Laterality: Left  Prep: chloraprep       Needles:  Injection technique: Single-shot  Needle Type: Echogenic Needle     Needle Length: 5cm  Needle Gauge: 21     Additional Needles:   Narrative:  Start time: 03/27/2024 12:19 PM End time: 03/27/2024 12:22 PM Injection made incrementally with aspirations every 5 mL.  Performed by: Personally  Anesthesiologist: Lucious Debby BRAVO, MD  Additional Notes: No pain on injection. No increased resistance to injection. Injection made in 5cc increments. Good needle visualization. Patient tolerated the procedure well.

## 2024-03-27 NOTE — Brief Op Note (Signed)
   Brief Op Note  Date of Surgery: 03/27/2024  Preoperative Diagnosis: LEFT SHOULDER ROTATOR CUFF ARTHROPLASTY  Postoperative Diagnosis: same  Procedure: Procedure(s): ARTHROPLASTY, SHOULDER, TOTAL, REVERSE  Implants: Implant Name Type Inv. Item Serial No. Manufacturer Lot No. LRB No. Used Action  SCREW BONE INTRNL SM 7 - [id number] Screw SCREW BONE INTRNL SM 7 [id number] [employer]  Left 1 Implanted  AUGMENT BASEPLATE 15DEG 25 WDG - [id number] Joint AUGMENT BASEPLATE 15DEG 25 WDG [id number] [employer]  Left 1 Implanted  GLENOSPHERE REV SHOULDER 36 - [id number] Joint GLENOSPHERE REV SHOULDER 36 [id number] [employer]  Left 1 Implanted  SCREW PERIPHERAL 30 - [id number] Screw SCREW PERIPHERAL 30  [employer]  Left 3 Implanted  SCREW 5.5X26 - [id number] Screw SCREW 5.5X26  [employer]  Left 1 Implanted  STEM HUMERAL PLUS SHORT SZ2+ - [id number] Orthopedic Implant STEM HUMERAL PLUS SHORT SZ2+ [id number] [employer]  Left 1 Implanted  INSERT REVERSED HUMERAL SIZE 1 - [id number] Orthopedic Implant INSERT REVERSED HUMERAL SIZE 1 [id number] [employer]  Left 1 Implanted    Surgeons: Surgeon(s): Rogers Standing, MD  Anesthesia: General    Estimated Blood Loss: See anesthesia record  Complications: None  Condition to PACU: Stable  Greg LITTIE Genelle, MD 03/27/2024 2:09 PM

## 2024-03-27 NOTE — Anesthesia Procedure Notes (Signed)
 Procedure Name: Intubation Date/Time: 03/27/2024 1:11 PM  Performed by: Edith Elsie [name], CRNAPre-anesthesia Checklist: Patient identified, Emergency Drugs available, Suction available and Patient being monitored Patient Re-evaluated:Patient Re-evaluated prior to induction Oxygen Delivery Method: Circle system utilized Preoxygenation: Pre-oxygenation with 100% oxygen Induction Type: IV induction Ventilation: Mask ventilation without difficulty Laryngoscope Size: Mac and 3 Grade View: Grade III Tube type: Oral Tube size: 7.0 mm Number of attempts: 1 Airway Equipment and Method: Stylet Placement Confirmation: ETT inserted through vocal cords under direct vision, positive ETCO2 and breath sounds checked- equal and bilateral Secured at: 22 cm Tube secured with: Tape Dental Injury: Teeth and Oropharynx as per pre-operative assessment

## 2024-03-27 NOTE — Anesthesia Preprocedure Evaluation (Addendum)
 Anesthesia Evaluation  Patient identified by MRN, date of birth, ID band Patient awake    Reviewed: Allergy & Precautions, NPO status , Patient's Chart, lab work & pertinent test results  History of Anesthesia Complications Negative for: history of anesthetic complications  Airway Mallampati: I  TM Distance: >3 FB Neck ROM: Full    Dental  (+) Dental Advisory Given, Teeth Intact   Pulmonary neg pulmonary ROS   Pulmonary exam normal        Cardiovascular negative cardio ROS Normal cardiovascular exam     Neuro/Psych  Headaches PSYCHIATRIC DISORDERS Anxiety Depression     Neuromuscular disease    GI/Hepatic Neg liver ROS,GERD  Controlled,,  Endo/Other  diabetes, Type 2, Insulin  Dependent, Oral Hypoglycemic Agents    Renal/GU negative Renal ROS     Musculoskeletal  (+) Arthritis ,    Abdominal   Peds  Hematology negative hematology ROS (+)   Anesthesia Other Findings   Reproductive/Obstetrics                              Anesthesia Physical Anesthesia Plan  ASA: 2  Anesthesia Plan: General   Post-op Pain Management: Tylenol  PO (pre-op)* and Regional block*   Induction: Intravenous  PONV Risk Score and Plan: 2 and Treatment may vary due to age or medical condition, Ondansetron  and Propofol  infusion  Airway Management Planned: Oral ETT  Additional Equipment: None  Intra-op Plan:   Post-operative Plan: Extubation in OR  Informed Consent: I have reviewed the patients History and Physical, chart, labs and discussed the procedure including the risks, benefits and alternatives for the proposed anesthesia with the patient or authorized representative who has indicated his/her understanding and acceptance.     Dental advisory given  Plan Discussed with: CRNA and Anesthesiologist  Anesthesia Plan Comments:          Anesthesia Quick Evaluation

## 2024-03-27 NOTE — Progress Notes (Signed)
 Assisted Dr. Lucious with left, interscalene , ultrasound guided block. Side rails up, monitors on throughout procedure. See vital signs in flow sheet. Tolerated Procedure well.

## 2024-03-27 NOTE — Op Note (Signed)
 Date of Surgery: 03/27/2024  INDICATIONS: Greg Rogers is a 83 y.o.-year-old male with left shoulder massive rotator cuff tear.  The risk and benefits of the procedure were discussed in detail and documented in the pre-operative evaluation.   PREOPERATIVE DIAGNOSIS: 1. 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  PROCEDURE: 1. Left shoulder reverse shoulder arthroplasty 2. Left shoulder biceps tenodesis  SURGEON: Greg LITTIE Parker MD  ASSISTANT: Conley Dawson, ATC  ANESTHESIA:  general  IV FLUIDS AND URINE: See anesthesia record.  ANTIBIOTICS: Ancef   ESTIMATED BLOOD LOSS: 50 mL.  IMPLANTS:  Implant Name Type Inv. Item Serial No. Manufacturer Lot No. LRB No. Used Action  SCREW BONE INTRNL SM 7 - [id number] Screw SCREW BONE INTRNL SM 7 [id number] [employer]  Left 1 Implanted  AUGMENT BASEPLATE 15DEG 25 WDG - [id number] Joint AUGMENT BASEPLATE 15DEG 25 WDG [id number] [employer]  Left 1 Implanted  GLENOSPHERE REV SHOULDER 36 - [id number] Joint GLENOSPHERE REV SHOULDER 36 [id number] [employer]  Left 1 Implanted  SCREW PERIPHERAL 30 - [id number] Screw SCREW PERIPHERAL 30  [employer]  Left 3 Implanted  SCREW 5.5X26 - [id number] Screw SCREW 5.5X26  [employer]  Left 1 Implanted  STEM HUMERAL PLUS SHORT SZ2+ - [id number] Orthopedic Implant STEM HUMERAL PLUS SHORT SZ2+ [id number] [employer]  Left 1 Implanted  INSERT REVERSED HUMERAL SIZE 1 - [id number] Orthopedic Implant INSERT REVERSED HUMERAL SIZE 1 [id number] [employer]  Left 1 Implanted    DRAINS: None  CULTURES: None  COMPLICATIONS: none  DESCRIPTION OF PROCEDURE:   Patient was identified in the preoperative holding area.  Anesthesia performed an interscalene nerve block after universal timeout was performed with nursing.  Ancef  was given 1 hour prior to skin incision.    The surgical site was scrubbed with a chlorhexidine scrub brush and alcohol.  The patient was then prepped with  chlorhexidine skin prep.  The patient was subsequently taken back to the operating room.  Anesthesia was induced. The patient was transferred to the beachchair position.  All bony prominences were padded.  Final timeout was again performed.     The bony landmarks of the shoulder were marked with a marking pen. A delto-pectoral incision was made, extending up approximately 5 inches. The wound with then irrigated with dilute betadine . Cephalic vein was identified, and an protected. This was retracted medially. Subdeltoid and subpectoral lesions were released. Neurovascular structures were carefully protected. The Gelpi retractor was used to retract the deltoid and pectoralis major.    The deltoid was retracted laterally with a Brown humeral retractor.  The conjoined tendon was identified. The cleido-pectoral fascia was excised.  The axillary nerve was palpated and carefully protected throughout the procedure. The biceps tendon was found and tenodesed to the upper pec with # 2 Ethibond non-absorbable suture.  Proximally the biceps tendon was removed up to the joint.  The bicipital groove was used for a landmark to establish rotator cuff interval. The subscap was tagged with a #2 Ethibond.  At this point the subscapularis was peeled off from the lesser tuberosity with care to avoid dissection distally in order to protect the axillary nerve.  Once the joint was exposed the proximal humerus was delivered with external rotation and extension of the arm. The humerus was prepped initially by performing a humeral neck cut. This was done with the guide using 30 degrees of retroversion as a reference.  The head portion was removed.  A medullary sounding reamer was  then used.  We subsequently placed our guidewire through the center of the humeral head using the reference guide.  This was a size 2.  Metaphyseal reamer was then used.  Finally the size 2 broach was malleted into place with excellent purchase.  A tonsil clamp  was used to attempt to pull this out with very good purchase   Attention was then turned to the glenoid.  Posteriorly a large Darach retractor was used.  A 360 Degree release of the subscapularis and glenoid were done. The capsule was released from the humerus.    Glenoid retractors were placed posteriorly, superiorly behind the biceps tendon and anteriorly on the glenoid neck. A 360-degree release of the capsule was performed with cautery.  The triceps was released off the inferior tubercle of the glenoid. The axillary nerve was carefully protected with the surgeon's index finger, retracting it and using cautery.   A guidepin was placed through the glenoid guide. The guidepin was drilled until it exited the cortex. The guidepin was over drilled. Next, the glenoid was prepared with the reamer down to cortical bone.  The central peg hole was totally within the scapular neck tested with the probe.  The baseplate was then placed screwed securely with good purchase in position and then secured with 4 screws. In each case, they were drilled and measured and the appropriate length screw placed with excellent rigid fixation of the baseplate. The glenosphere was placed with size based based on pre-operative templating.   The humerus was then delivered and a neutral polyethylene trial was placed.  This was brought to just the level of the reduction but not completely reduced.  A 0 final poly was selected and impacted.    Appropriate tension was noted on the conjoined tendon and deltoid muscle.  Extension was stable, external and internal rotation as well.  The subscap was pulled over but as this was not able to reach comfortably decision was made not to repair in order to prevent limited in external rotation.  The wound was then irrigated. Vancomycin  powder was placed in the wound again for infection prevention.   The wound was then closed in layers with 0 Vicryl interrupted in the deep subcu followed by 2-0 Vicryl  in the superficial subcu and 3-0 nylon for skin.  An Aquacel dressing was applied as well as an Veterinary surgeon.  A shoulder immobilizer was applied.     Postoperative Plan: -The patient will begin the reverse shoulder rehab protocol  -Aspirin  325 mg daily will be used for 4 weeks for blood clot prevention -I will see the patient back in 2 weeks for first postoperative wound check      Greg LITTIE Parker, MD 2:09 PM

## 2024-03-27 NOTE — Discharge Instructions (Addendum)
 Discharge Instructions    Attending Surgeon: Elspeth Parker, MD Office Phone Number: [phone]   Diagnosis and Procedures:    Surgeries Performed: Left shoulder reverse shoulder arthoplasty  Discharge Plan:    Diet: Resume usual diet. Begin with light or bland foods.  Drink plenty of fluids.  Activity:  Keep sling and dressing in place until your follow up visit in Physical Therapy You are advised to go home directly from the hospital or surgical center. Restrict your activities.  GENERAL INSTRUCTIONS: 1.  Keep your surgical site elevated above your heart for at least 5-7 days or longer to prevent swelling. This will improve your comfort and your overall recovery following surgery.     2. Please call Dr. Danetta office at [phone] with questions Monday-Friday during business hours. If no one answers, please leave a message and someone should get back to the patient within 24 hours. For emergencies please call 911 or proceed to the emergency room.   3. Patient to notify surgical team if experiences any of the following: Bowel/Bladder dysfunction, uncontrolled pain, nerve/muscle weakness, incision with increased drainage or redness, nausea/vomiting and Fever greater than 101.0 F.  Be alert for signs of infection including redness, streaking, odor, fever or chills. Be alert for excessive pain or bleeding and notify your surgeon immediately.  WOUND INSTRUCTIONS:   Leave your dressing/cast/splint in place until your post operative visit.  Keep it clean and dry.  Always keep the incision clean and dry until the staples/sutures are removed. If there is no drainage from the incision you should keep it open to air. If there is drainage from the incision you must keep it covered at all times until the drainage stops  Do not soak in a bath tub, hot tub, pool, lake or other body of water until 21 days after your surgery and your incision is completely dry and healed.  If you  have removable sutures (or staples) they must be removed 10-14 days (unless otherwise instructed) from the day of your surgery.     1)  Elevate the extremity as much as possible.  2)  Keep the dressing clean and dry.  3)  Please call us  if the dressing becomes wet or dirty.  4)  If you are experiencing worsening pain or worsening swelling, please call.     MEDICATIONS: Resume all previous home medications at the previous prescribed dose and frequency unless otherwise noted Start taking the  pain medications on an as-needed basis as prescribed  Please taper down pain medication over the next week following surgery.  Ideally you should not require a refill of any narcotic pain medication.  Take pain medication with food to minimize nausea. In addition to the prescribed pain medication, you may take over-the-counter pain relievers such as Tylenol .  Do NOT take additional tylenol  if your pain medication already has tylenol  in it.  Aspirin  325mg  daily per bottle instructions. Narcotic Policy: Per [facility] clinic policy, our goal is ensure optimal postoperative pain control with a multimodal pain management strategy. For all [facility] patients, our goal is to wean post-operative narcotic medications by 6 weeks post-operatively, and many times sooner. If this is not possible due to utilization of pain medication prior to surgery, your [facility] doctor will support your acute post-operative pain control for the first 6 weeks postoperatively, with a plan to transition you back to your primary pain team following that. Greg Rogers will work to ensure a Therapist, occupational.  FOLLOWUP INSTRUCTIONS: 1. Follow up at the Physical Therapy Clinic 3-4 days following surgery. This appointment should be scheduled unless other arrangements have been made.The Physical Therapy scheduling number is [phone] if an appointment has not already been arranged.  2. Contact Dr. Danetta office during office hours at  [phone] or the practice after hours line at [phone] for non-emergencies. For medical emergencies call 911.   Discharge Location: Home  NO TYLENOL  UNTIL 5:40 p.m.  Post Anesthesia Home Care Instructions  Activity: Get plenty of rest for the remainder of the day. A responsible individual must stay with you for 24 hours following the procedure.  For the next 24 hours, DO NOT: -Drive a car -Advertising copywriter -Drink alcoholic beverages -Take any medication unless instructed by your physician -Make any legal decisions or sign important papers.  Meals: Start with liquid foods such as gelatin or soup. Progress to regular foods as tolerated. Avoid greasy, spicy, heavy foods. If nausea and/or vomiting occur, drink only clear liquids until the nausea and/or vomiting subsides. Call your physician if vomiting continues.  Special Instructions/Symptoms: Your throat may feel dry or sore from the anesthesia or the breathing tube placed in your throat during surgery. If this causes discomfort, gargle with warm salt water. The discomfort should disappear within 24 hours.  If you had a scopolamine patch placed behind your ear for the management of post- operative nausea and/or vomiting:  1. The medication in the patch is effective for 72 hours, after which it should be removed.  Wrap patch in a tissue and discard in the trash. Wash hands thoroughly with soap and water. 2. You may remove the patch earlier than 72 hours if you experience unpleasant side effects which may include dry mouth, dizziness or visual disturbances. 3. Avoid touching the patch. Wash your hands with soap and water after contact with the patch.   Regional Anesthesia Blocks  1. You may not be able to move or feel the blocked extremity after a regional anesthetic block. This may last may last from 3-48 hours after placement, but it will go away. The length of time depends on the medication injected and your individual  response to the medication. As the nerves start to wake up, you may experience tingling as the movement and feeling returns to your extremity. If the numbness and inability to move your extremity has not gone away after 48 hours, please call your surgeon.   2. The extremity that is blocked will need to be protected until the numbness is gone and the strength has returned. Because you cannot feel it, you will need to take extra care to avoid injury. Because it may be weak, you may have difficulty moving it or using it. You may not know what position it is in without looking at it while the block is in effect.  3. For blocks in the legs and feet, returning to weight bearing and walking needs to be done carefully. You will need to wait until the numbness is entirely gone and the strength has returned. You should be able to move your leg and foot normally before you try and bear weight or walk. You will need someone to be with you when you first try to ensure you do not fall and possibly risk injury.  4. Bruising and tenderness at the needle site are common side effects and will resolve in a few days.  5. Persistent numbness or new problems with movement should be communicated to the surgeon  or the [facility] [phone] [facility] [phone]).Information for Discharge Teaching: EXPAREL  (bupivacaine  liposome injectable suspension)   Pain relief is important to your recovery. The goal is to control your pain so you can move easier and return to your normal activities as soon as possible after your procedure. Your physician may use several types of medicines to manage pain, swelling, and more.  Your surgeon or anesthesiologist gave you EXPAREL (bupivacaine ) to help control your pain after surgery.  EXPAREL  is a local anesthetic designed to release slowly over an extended period of time to provide pain relief by numbing the tissue around the surgical site. EXPAREL  is  designed to release pain medication over time and can control pain for up to 72 hours. Depending on how you respond to EXPAREL , you may require less pain medication during your recovery. EXPAREL  can help reduce or eliminate the need for opioids during the first few days after surgery when pain relief is needed the most. EXPAREL  is not an opioid and is not addictive. It does not cause sleepiness or sedation.   Important! A teal colored band has been placed on your arm with the date, time and amount of EXPAREL  you have received. Please leave this armband in place for the full 96 hours following administration, and then you may remove the band. If you return to the hospital for any reason within 96 hours following the administration of EXPAREL , the armband provides important information that your health care providers to know, and alerts them that you have received this anesthetic.    Possible side effects of EXPAREL : Temporary loss of sensation or ability to move in the area where medication was injected. Nausea, vomiting, constipation Rarely, numbness and tingling in your mouth or lips, lightheadedness, or anxiety may occur. Call your doctor right away if you think you may be experiencing any of these sensations, or if you have other questions regarding possible side effects.  Follow all other discharge instructions given to you by your surgeon or nurse. Eat a healthy diet and drink plenty of water or other fluids.

## 2024-03-28 ENCOUNTER — Telehealth (HOSPITAL_BASED_OUTPATIENT_CLINIC_OR_DEPARTMENT_OTHER): Payer: Self-pay | Admitting: Orthopaedic Surgery

## 2024-03-28 ENCOUNTER — Encounter (HOSPITAL_BASED_OUTPATIENT_CLINIC_OR_DEPARTMENT_OTHER): Payer: Self-pay | Admitting: Orthopaedic Surgery

## 2024-03-28 ENCOUNTER — Encounter: Payer: Self-pay | Admitting: Family Medicine

## 2024-03-28 ENCOUNTER — Other Ambulatory Visit: Payer: Self-pay

## 2024-03-28 NOTE — Telephone Encounter (Signed)
 Patient wanted you to know that is doing great and vey please not discomfort

## 2024-03-30 ENCOUNTER — Ambulatory Visit (HOSPITAL_BASED_OUTPATIENT_CLINIC_OR_DEPARTMENT_OTHER): Attending: Orthopaedic Surgery | Admitting: Physical Therapy

## 2024-03-30 ENCOUNTER — Encounter (HOSPITAL_BASED_OUTPATIENT_CLINIC_OR_DEPARTMENT_OTHER): Payer: Self-pay | Admitting: Physical Therapy

## 2024-03-30 ENCOUNTER — Other Ambulatory Visit: Payer: Self-pay

## 2024-03-30 DIAGNOSIS — M25612 Stiffness of left shoulder, not elsewhere classified: Secondary | ICD-10-CM | POA: Insufficient documentation

## 2024-03-30 DIAGNOSIS — R29898 Other symptoms and signs involving the musculoskeletal system: Secondary | ICD-10-CM | POA: Insufficient documentation

## 2024-03-30 DIAGNOSIS — S46011A Strain of muscle(s) and tendon(s) of the rotator cuff of right shoulder, initial encounter: Secondary | ICD-10-CM | POA: Insufficient documentation

## 2024-03-30 DIAGNOSIS — M6281 Muscle weakness (generalized): Secondary | ICD-10-CM | POA: Insufficient documentation

## 2024-03-30 DIAGNOSIS — M25512 Pain in left shoulder: Secondary | ICD-10-CM | POA: Insufficient documentation

## 2024-03-30 DIAGNOSIS — X58XXXA Exposure to other specified factors, initial encounter: Secondary | ICD-10-CM | POA: Diagnosis not present

## 2024-03-30 NOTE — Therapy (Signed)
 OUTPATIENT PHYSICAL THERAPY SHOULDER EVALUATION   Patient Name: Greg Rogers. MRN: [id number] DOB:Apr 28, 1941, 83 y.o., male Today's Date: 03/30/2024  END OF SESSION:  PT End of Session - 03/30/24 0850     Visit Number 1    Number of Visits 24    Date for PT Re-Evaluation 06/22/24    Authorization Type MCR    Progress Note Due on Visit 10    PT Start Time 0850    PT Stop Time 0927    PT Time Calculation (min) 37 min    Activity Tolerance Patient tolerated treatment well    Behavior During Therapy WFL for tasks assessed/performed          Past Medical History:  Diagnosis Date   Abnormal GGT test    With otherwise normal work up.   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)    Persistent disorder of initiating or maintaining sleep    Pes planus    Rosacea    Snoring    Past Surgical History:  Procedure Laterality Date   APPENDECTOMY  1949   at age 22   CERVICAL FUSION  03/2016   C 3&4   Cyst removed from breast  1960   FOOT SURGERY  01/2009   Left   HERNIA REPAIR  1956   at age 59 (single)   HERNIA REPAIR  1960   (double)   Left 2nd toe removed  03/2017   REVERSE SHOULDER ARTHROPLASTY Left 03/27/2024   Procedure: ARTHROPLASTY, SHOULDER, TOTAL, REVERSE;  Surgeon: Genelle Standing, MD;  Location: [facility];  Service: Orthopedics;  Laterality: Left;   SPINE SURGERY     TONSILLECTOMY AND ADENOIDECTOMY  ~ 1950   VASECTOMY  ~ 1984   Patient Active Problem List   Diagnosis Date Noted   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 03/27/2024   Leg pain 03/21/2024   Shoulder pain 12/07/2023   Change in stool 11/02/2023   Nasal lesion 06/02/2023   Occipital neuralgia 10/12/2020   Wound of right leg, initial encounter  07/03/2019   Healthcare maintenance 03/04/2017   Foot pain 07/22/2016   Cervical stenosis of spine 01/06/2016   thyroid  nodule-benign path on bx 01/2016 01/06/2016   Radicular pain in right arm 12/22/2015   Radicular leg pain 12/22/2015   Back pain 07/06/2015   Constipation 07/06/2015   FH: CAD (coronary artery disease) 05/23/2015   Family history of ischemic heart disease and other diseases of the circulatory system 05/23/2015   Advance care planning 11/26/2014   Pain in joint, shoulder region 11/26/2014   Medicare annual wellness visit, subsequent 09/27/2012   GERD (gastroesophageal reflux disease) 07/05/2012   Irritability 03/29/2011   Advance directive on file 01/09/2011   Pain in joint, other site 01/04/2011   Arthropathy 09/02/2010   NEPHROLITHIASIS, HX OF 09/02/2010   Diabetes mellitus without complication (HCC) 09/01/2010   HLD (hyperlipidemia) 10/24/2009   SNORING 10/24/2009   FOOT PAIN, BILATERAL 10/06/2009   Pes planus 10/06/2009   Acquired flat foot 10/06/2009    PCP: Cleatus Arlyss RAMAN, MD  REFERRING PROVIDER: Genelle Standing, MD  REFERRING DIAG: S46.012A (ICD-10-CM) - 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, initial  encounter;  1. Left shoulder reverse shoulder arthroplasty 2. Left shoulder biceps tenodesis  THERAPY DIAG:  Acute pain of left shoulder  Muscle weakness (generalized)  Stiffness of left shoulder, not elsewhere classified  Other symptoms and signs involving the musculoskeletal system  Rationale for Evaluation and Treatment: Rehabilitation  ONSET DATE: DOS 03/27/24  SUBJECTIVE:                                                                                                                                                                                      SUBJECTIVE STATEMENT: Patient states shoulder has been doing good. Using sling. Was very active prior to injury/surgery with gardening/yard work Hand dominance: Right  PERTINENT  HISTORY: DM, HLD, chronic shoulder pain with fall in March while exiting ski lift with RC tear  PAIN:  Are you having pain? No currently  PRECAUTIONS: Other: L reverse TSA   RED FLAGS: None   WEIGHT BEARING RESTRICTIONS: NWB LUE  FALLS:  Has patient fallen in last 6 months? Yes. Number of falls 1  PLOF: Independent  PATIENT GOALS:regain use of the arm  OBJECTIVE: (objective measures from initial evaluation unless otherwise dated)  OBSERVATION: using sling, waterproof dressing intact  PATIENT SURVEYS:  UEFS  Extreme difficulty/unable (0), Quite a bit of difficulty (1), Moderate difficulty (2), Little difficulty (3), No difficulty (4) Survey date:  03/30/24  Any of your usual work, household or school activities 0  2. Your usual hobbies, recreational/sport activities 1   3. Lifting a bag of groceries to waist level 2   4. Lifting a bag of groceries above your head 0  5. Grooming your hair 4  6. Pushing up on your hands (I.e. from bathtub or chair) 0  7. Preparing food (I.e. peeling/cutting) 1  8. Driving  3  9. Vacuuming, sweeping, or raking 4  10. Dressing  3  11. Doing up buttons 2  12. Using tools/appliances 1  13. Opening doors 0  14. Cleaning  3  15. Tying or lacing shoes 2  16. Sleeping  3  17. Laundering clothes (I.e. washing, ironing, folding) 3  18. Opening a jar 3  19. Throwing a ball 1  20. Carrying a small suitcase with your affected limb.  1  Score total:  35/80     COGNITION: Overall cognitive status: Within functional limits for tasks assessed     SENSATION: WFL  POSTURE: rounded shoulders and forward head   UPPER EXTREMITY ROM: AROM RUE WFL  Passive ROM Right eval Left eval  Shoulder flexion  100 PROM 110 AAROM  Shoulder extension    Shoulder abduction    Shoulder adduction  Shoulder internal rotation    Shoulder external rotation  20  Elbow flexion    Elbow extension    Wrist flexion    Wrist extension    Wrist ulnar  deviation    Wrist radial deviation    Wrist pronation    Wrist supination    (Blank rows = not tested) *=pain/symptoms  UPPER EXTREMITY MMT: NT due to post op status  MMT Right eval Left eval  Shoulder flexion    Shoulder extension    Shoulder abduction    Shoulder adduction    Shoulder internal rotation    Shoulder external rotation    Middle trapezius    Lower trapezius    Elbow flexion    Elbow extension    Wrist flexion    Wrist extension    Wrist ulnar deviation    Wrist radial deviation    Wrist pronation    Wrist supination    Grip strength (lbs)    (Blank rows = not tested) *=pain/symptoms    PALPATION:  LUE gross edema   TODAY'S TREATMENT:                                                                                                                                         DATE:  03/30/24 Eval, dressing change, HEP, education Wrist hand AROM Elbow AROM 1 x 10 Scap retraction 1 x 10 Seated AAROM ER 1 x 10 Supine AAROM well arm assisted flexion 1 x 10    PATIENT EDUCATION:  Education details: Patient educated on exam findings, POC, scope of PT, HEP, relevant anatomy/biomechanics, protocol/precautions. Person educated: Patient Education method: Explanation, Demonstration, and Handouts Education comprehension: verbalized understanding, returned demonstration, verbal cues required, and tactile cues required  HOME EXERCISE PROGRAM: Access Code: [email or web address] URL: [email or web address] Date: 03/30/2024 Prepared by: Prentice Verle Brillhart  Exercises - Wrist AROM Flexion Extension  - 3 x daily - 7 x weekly - 2 sets - 10 reps - Seated Elbow Flexion and Extension AROM (Mirrored)  - 3 x daily - 7 x weekly - 2 sets - 10 reps - Circular Shoulder Pendulum with Table Support (Mirrored)  - 3 x daily - 7 x weekly - 2 sets - 10 reps - Seated Scapular Retraction  - 3 x daily - 7 x weekly - 2 sets - 10 reps - Seated Shoulder Flexion Towel Slide at Table Top  (Mirrored)  - 3 x daily - 7 x weekly - 10 reps - 10 second hold - Supine Shoulder Flexion AAROM with Hands Clasped  - 3 x daily - 7 x weekly - 3 sets - 10 reps - Supine Shoulder External Rotation with Dowel  - 3 x daily - 7 x weekly - 2 sets - 10 reps  ASSESSMENT:  CLINICAL IMPRESSION: Patient a 83 y.o. y.o. male who was seen today for physical therapy evaluation and treatment for L reverse TSA  DOS 03/27/24. Patient presents with pain limited deficits in L shoulder strength, ROM, endurance, activity tolerance, and functional mobility with ADL. Patient is having to modify and restrict ADL as indicated by outcome measure score as well as subjective information and objective measures which is affecting overall participation. Patient will benefit from skilled physical therapy in order to improve function and reduce impairment.  OBJECTIVE IMPAIRMENTS: decreased activity tolerance, decreased endurance, decreased mobility, decreased ROM, decreased strength, increased muscle spasms, impaired flexibility, impaired UE functional use, improper body mechanics, and pain  ACTIVITY LIMITATIONS:  carrying, lifting, bending, sleeping, bed mobility, bathing, dressing, reach over head, hygiene/grooming, and caring for others  PARTICIPATION LIMITATIONS:  meal prep, cleaning, laundry, driving, shopping, community activity, and yard work  PERSONAL FACTORS: Age, Time since onset of injury/illness/exacerbation, and 1-2 comorbidities: DM, HLD are also affecting patient's functional outcome.   REHAB POTENTIAL: Good  CLINICAL DECISION MAKING: Evolving/moderate complexity  EVALUATION COMPLEXITY: Moderate  GOALS: Goals reviewed with patient? Yes  SHORT TERM GOALS: Target date: 05/11/2024    Patient will be independent with HEP in order to improve functional outcomes. Baseline: Goal status: INITIAL  2.  Patient will report at least 25% improvement in symptoms for improved quality of life. Baseline:  Goal status:  INITIAL  3.  Patient will demonstrate Elevation to 130 deg and ER to 30 deg - passive, active assisted or active  Baseline:  Goal status: INITIAL  4.  Patient will be out of sling per surgeon approval for improved functional use of L shoulder. Baseline:  Goal status: INITIAL   LONG TERM GOALS: Target date: 06/22/2024    Patient will report at least 75% improvement in symptoms for improved quality of life. Baseline:  Goal status: INITIAL  2.  Patient will improve UEFS score by at least 25 points in order to indicate improved tolerance to activity. Baseline:  Goal status: INITIAL  3.  Patient will demonstrate at least PROM/AAROM/AROM to: Elevation - 145-160; ER - 40-50 ; functional IR to L1 in L shoulder for improved ability lift overhead. Baseline:  Goal status: INITIAL  4.  Patient will be able to return to all activities unrestricted for improved ability to perform yard work functions and participate with family.  Baseline:  Goal status: INITIAL  5.  Patient will demonstrate grade of 5/5 MMT grade in all tested musculature as evidence of improved strength to assist with lifting at home. Baseline:  Goal status: INITIAL   PLAN:  PT FREQUENCY: 1-2x/week  PT DURATION: 12 weeks  PLANNED INTERVENTIONS:[id number]- PT Re-evaluation, 97110-Therapeutic exercises, 97530- Therapeutic activity, [id number]- Neuromuscular re-education, 97535- Self Care, [id number]- Manual therapy, [phone]- Gait training, [phone]- Orthotic Fit/training, [phone]- Canalith repositioning, [id number]- Aquatic Therapy, [phone]- Splinting, [phone]- Wound care (first 20 sq cm), 97598- Wound care (each additional 20 sq cm)Patient/Family education, Balance training, Stair training, Taping, Dry Needling, Joint mobilization, Joint manipulation, Spinal manipulation, Spinal mobilization, Scar mobilization, and DME instructions.  PLAN FOR NEXT SESSION: progress with reverse TSA protocol   Prentice GORMAN Stains, PT, DPT 03/30/2024, 9:29 AM

## 2024-04-02 ENCOUNTER — Encounter (HOSPITAL_BASED_OUTPATIENT_CLINIC_OR_DEPARTMENT_OTHER): Payer: Self-pay | Admitting: Orthopaedic Surgery

## 2024-04-02 ENCOUNTER — Telehealth: Payer: Self-pay | Admitting: Orthopaedic Surgery

## 2024-04-02 NOTE — Telephone Encounter (Signed)
 Pt just wanted to notify Dr Genelle of a little drainage on pt's bandage. Pt states no swelling or redness. Pt phone number is [phone]

## 2024-04-03 NOTE — Telephone Encounter (Signed)
 Responded via [employer]

## 2024-04-06 ENCOUNTER — Ambulatory Visit (HOSPITAL_BASED_OUTPATIENT_CLINIC_OR_DEPARTMENT_OTHER): Admitting: Physical Therapy

## 2024-04-06 ENCOUNTER — Encounter (HOSPITAL_BASED_OUTPATIENT_CLINIC_OR_DEPARTMENT_OTHER): Payer: Self-pay | Admitting: Physical Therapy

## 2024-04-06 DIAGNOSIS — R29898 Other symptoms and signs involving the musculoskeletal system: Secondary | ICD-10-CM

## 2024-04-06 DIAGNOSIS — M25512 Pain in left shoulder: Secondary | ICD-10-CM

## 2024-04-06 DIAGNOSIS — M25612 Stiffness of left shoulder, not elsewhere classified: Secondary | ICD-10-CM | POA: Diagnosis not present

## 2024-04-06 DIAGNOSIS — M6281 Muscle weakness (generalized): Secondary | ICD-10-CM | POA: Diagnosis not present

## 2024-04-06 DIAGNOSIS — S46011A Strain of muscle(s) and tendon(s) of the rotator cuff of right shoulder, initial encounter: Secondary | ICD-10-CM | POA: Diagnosis not present

## 2024-04-06 NOTE — Therapy (Signed)
 OUTPATIENT PHYSICAL THERAPY SHOULDER TREATMENT    Patient Name: Greg Rogers. MRN: [id number] DOB:June 16, 1941, 83 y.o., male Today's Date: 04/06/2024  END OF SESSION:  PT End of Session - 04/06/24 0910     Visit Number 2    Number of Visits 24    Date for PT Re-Evaluation 06/22/24    Authorization Type MCR    Progress Note Due on Visit 10    PT Start Time 0849    PT Stop Time 0928    PT Time Calculation (min) 39 min    Activity Tolerance Patient tolerated treatment well    Behavior During Therapy WFL for tasks assessed/performed           Past Medical History:  Diagnosis Date   Abnormal GGT test    With otherwise normal work up.   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)    Persistent disorder of initiating or maintaining sleep    Pes planus    Rosacea    Snoring    Past Surgical History:  Procedure Laterality Date   APPENDECTOMY  1949   at age 83   CERVICAL FUSION  03/2016   C 3&4   Cyst removed from breast  1960   FOOT SURGERY  01/2009   Left   HERNIA REPAIR  1956   at age 14 (single)   HERNIA REPAIR  1960   (double)   Left 2nd toe removed  03/2017   REVERSE SHOULDER ARTHROPLASTY Left 03/27/2024   Procedure: ARTHROPLASTY, SHOULDER, TOTAL, REVERSE;  Surgeon: Genelle Standing, MD;  Location: [facility];  Service: Orthopedics;  Laterality: Left;   SPINE SURGERY     TONSILLECTOMY AND ADENOIDECTOMY  ~ 1950   VASECTOMY  ~ 1984   Patient Active Problem List   Diagnosis Date Noted   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 03/27/2024   Leg pain 03/21/2024   Shoulder pain 12/07/2023   Change in stool 11/02/2023   Nasal lesion 06/02/2023   Occipital neuralgia 10/12/2020   Wound of right leg, initial encounter  07/03/2019   Healthcare maintenance 03/04/2017   Foot pain 07/22/2016   Cervical stenosis of spine 01/06/2016   thyroid  nodule-benign path on bx 01/2016 01/06/2016   Radicular pain in right arm 12/22/2015   Radicular leg pain 12/22/2015   Back pain 07/06/2015   Constipation 07/06/2015   FH: CAD (coronary artery disease) 05/23/2015   Family history of ischemic heart disease and other diseases of the circulatory system 05/23/2015   Advance care planning 11/26/2014   Pain in joint, shoulder region 11/26/2014   Medicare annual wellness visit, subsequent 09/27/2012   GERD (gastroesophageal reflux disease) 07/05/2012   Irritability 03/29/2011   Advance directive on file 01/09/2011   Pain in joint, other site 01/04/2011   Arthropathy 09/02/2010   NEPHROLITHIASIS, HX OF 09/02/2010   Diabetes mellitus without complication (HCC) 09/01/2010   HLD (hyperlipidemia) 10/24/2009   SNORING 10/24/2009   FOOT PAIN, BILATERAL 10/06/2009   Pes planus 10/06/2009   Acquired flat foot 10/06/2009    PCP: Cleatus Arlyss RAMAN, MD  REFERRING PROVIDER: Genelle Standing, MD  REFERRING DIAG: S46.012A (ICD-10-CM) - Traumatic complete tear of left rotator  cuff, initial encounter;  1. Left shoulder reverse shoulder arthroplasty 2. Left shoulder biceps tenodesis  THERAPY DIAG:  Acute pain of left shoulder  Muscle weakness (generalized)  Stiffness of left shoulder, not elsewhere classified  Other symptoms and signs involving the musculoskeletal system  Rationale for Evaluation and Treatment: Rehabilitation  ONSET DATE: DOS 03/27/24  SUBJECTIVE:                                                                                                                                                                                      SUBJECTIVE STATEMENT:    Shoulder is going well, no pain or discomfort. HEP is coming along well, in truth I have been only been able to get two in per day, I like to do three though.     EVAL: Patient states shoulder has been doing good. Using sling. Was very active prior to injury/surgery with gardening/yard work Hand dominance: Right  PERTINENT HISTORY: DM, HLD, chronic shoulder pain with fall in March while exiting ski lift with RC tear  PAIN:  Are you having pain? No 0/10 now, don't really have pain   PRECAUTIONS: Other: L reverse TSA   RED FLAGS: None   WEIGHT BEARING RESTRICTIONS: NWB LUE  FALLS:  Has patient fallen in last 6 months? Yes. Number of falls 1  PLOF: Independent  PATIENT GOALS:regain use of the arm  OBJECTIVE: (objective measures from initial evaluation unless otherwise dated)  OBSERVATION: using sling, waterproof dressing intact  PATIENT SURVEYS:  UEFS  Extreme difficulty/unable (0), Quite a bit of difficulty (1), Moderate difficulty (2), Little difficulty (3), No difficulty (4) Survey date:  03/30/24  Any of your usual work, household or school activities 0  2. Your usual hobbies, recreational/sport activities 1   3. Lifting a bag of groceries to waist level 2   4. Lifting a bag of groceries above your head 0  5. Grooming your hair 4  6. Pushing up on your hands (I.e. from bathtub or chair) 0  7. Preparing food (I.e. peeling/cutting) 1  8. Driving  3  9. Vacuuming, sweeping, or raking 4  10. Dressing  3  11. Doing up buttons 2  12. Using tools/appliances 1  13. Opening doors 0  14. Cleaning  3  15. Tying or lacing shoes 2  16. Sleeping  3  17. Laundering clothes (I.e. washing, ironing, folding) 3  18. Opening a jar 3  19. Throwing a ball 1  20. Carrying a small suitcase with your affected limb.  1  Score total:  35/80     COGNITION: Overall cognitive status: Within functional limits for tasks assessed  SENSATION: WFL  POSTURE: rounded shoulders and forward head   UPPER EXTREMITY ROM: AROM RUE WFL  Passive ROM Left eval L 04/06/24  Shoulder flexion 100 PROM 110 AAROM 110* PROM, 120* AAROM supine    Shoulder extension    Shoulder abduction    Shoulder adduction    Shoulder internal rotation    Shoulder external rotation 20 25* next to body PROM setaed (very guarded), 30* AAROM seated   Elbow flexion    Elbow extension    Wrist flexion    Wrist extension    Wrist ulnar deviation    Wrist radial deviation    Wrist pronation    Wrist supination    (Blank rows = not tested) *=pain/symptoms  UPPER EXTREMITY MMT: NT due to post op status  MMT Right eval Left eval  Shoulder flexion    Shoulder extension    Shoulder abduction    Shoulder adduction    Shoulder internal rotation    Shoulder external rotation    Middle trapezius    Lower trapezius    Elbow flexion    Elbow extension    Wrist flexion    Wrist extension    Wrist ulnar deviation    Wrist radial deviation    Wrist pronation    Wrist supination    Grip strength (lbs)    (Blank rows = not tested) *=pain/symptoms    PALPATION:  LUE gross edema   TODAY'S TREATMENT:                                                                                                                                         DATE:    04/06/24   Education/corrections given on HEP form, reassured him that getting ROM back especially for ER after shoulder surgery does take time, reviewed reverse TSA precautions  PROM as appropriate supine  Supine AAROM flexion x12 with cane  Supine chest presses with cane x12  Supine AAROM scaption x12 with cane, MinA from PT  Supine hand closing/opening with shoulder at 90* to help reduce hand edema  Seated scap retractions 20x3 seconds  Seated backward shoulder rolls x20  Seated ER AAROM seated sheet tucked to ribs cane x12     03/30/24 Eval, dressing change, HEP, education Wrist hand AROM Elbow AROM 1 x 10 Scap retraction 1 x 10 Seated AAROM ER 1 x 10 Supine AAROM well arm assisted flexion 1 x 10    PATIENT EDUCATION:  Education details: Patient educated on exam findings, POC,  scope of PT, HEP, relevant anatomy/biomechanics, protocol/precautions. Person educated: Patient Education method: Explanation, Demonstration, and Handouts Education comprehension: verbalized understanding, returned demonstration, verbal cues required, and tactile cues required  HOME EXERCISE PROGRAM: Access Code: [email or web address] URL: [email or web address] Date: 03/30/2024 Prepared by: Prentice Zaunegger  Exercises - Wrist AROM Flexion Extension  - 3 x daily - 7 x weekly - 2 sets - 10  reps - Seated Elbow Flexion and Extension AROM (Mirrored)  - 3 x daily - 7 x weekly - 2 sets - 10 reps - Circular Shoulder Pendulum with Table Support (Mirrored)  - 3 x daily - 7 x weekly - 2 sets - 10 reps - Seated Scapular Retraction  - 3 x daily - 7 x weekly - 2 sets - 10 reps - Seated Shoulder Flexion Towel Slide at Table Top (Mirrored)  - 3 x daily - 7 x weekly - 10 reps - 10 second hold - Supine Shoulder Flexion AAROM with Hands Clasped  - 3 x daily - 7 x weekly - 3 sets - 10 reps - Supine Shoulder External Rotation with Dowel  - 3 x daily - 7 x weekly - 2 sets - 10 reps  ASSESSMENT:  CLINICAL IMPRESSION:  Arrives today doing well, we worked on ROM and gentle postural retraining as per protocol. Doing well with very little to no pain. Will continue to progress as appropriate.     EVAL: Patient a 83 y.o. y.o. male who was seen today for physical therapy evaluation and treatment for L reverse TSA DOS 03/27/24. Patient presents with pain limited deficits in L shoulder strength, ROM, endurance, activity tolerance, and functional mobility with ADL. Patient is having to modify and restrict ADL as indicated by outcome measure score as well as subjective information and objective measures which is affecting overall participation. Patient will benefit from skilled physical therapy in order to improve function and reduce impairment.  OBJECTIVE IMPAIRMENTS: decreased activity tolerance, decreased endurance,  decreased mobility, decreased ROM, decreased strength, increased muscle spasms, impaired flexibility, impaired UE functional use, improper body mechanics, and pain  ACTIVITY LIMITATIONS:  carrying, lifting, bending, sleeping, bed mobility, bathing, dressing, reach over head, hygiene/grooming, and caring for others  PARTICIPATION LIMITATIONS:  meal prep, cleaning, laundry, driving, shopping, community activity, and yard work  PERSONAL FACTORS: Age, Time since onset of injury/illness/exacerbation, and 1-2 comorbidities: DM, HLD are also affecting patient's functional outcome.   REHAB POTENTIAL: Good  CLINICAL DECISION MAKING: Evolving/moderate complexity  EVALUATION COMPLEXITY: Moderate  GOALS: Goals reviewed with patient? Yes  SHORT TERM GOALS: Target date: 05/11/2024    Patient will be independent with HEP in order to improve functional outcomes. Baseline: Goal status: IN PROGRESS 04/06/24  2.  Patient will report at least 25% improvement in symptoms for improved quality of life. Baseline:  Goal status: INITIAL  3.  Patient will demonstrate Elevation to 130 deg and ER to 30 deg - passive, active assisted or active  Baseline:  Goal status: IN PROGRESS  4.  Patient will be out of sling per surgeon approval for improved functional use of L shoulder. Baseline:  Goal status: INITIAL   LONG TERM GOALS: Target date: 06/22/2024    Patient will report at least 75% improvement in symptoms for improved quality of life. Baseline:  Goal status: INITIAL  2.  Patient will improve UEFS score by at least 25 points in order to indicate improved tolerance to activity. Baseline:  Goal status: INITIAL  3.  Patient will demonstrate at least PROM/AAROM/AROM to: Elevation - 145-160; ER - 40-50 ; functional IR to L1 in L shoulder for improved ability lift overhead. Baseline:  Goal status: INITIAL  4.  Patient will be able to return to all activities unrestricted for improved ability to  perform yard work functions and participate with family.  Baseline:  Goal status: INITIAL  5.  Patient will demonstrate grade of 5/5 MMT  grade in all tested musculature as evidence of improved strength to assist with lifting at home. Baseline:  Goal status: INITIAL   PLAN:  PT FREQUENCY: 1-2x/week  PT DURATION: 12 weeks  PLANNED INTERVENTIONS:[id number]- PT Re-evaluation, 97110-Therapeutic exercises, 97530- Therapeutic activity, [id number]- Neuromuscular re-education, 97535- Self Care, [id number]- Manual therapy, [phone]- Gait training, [phone]- Orthotic Fit/training, [phone]- Canalith repositioning, [id number]- Aquatic Therapy, [phone]- Splinting, [phone]- Wound care (first 20 sq cm), 97598- Wound care (each additional 20 sq cm)Patient/Family education, Balance training, Stair training, Taping, Dry Needling, Joint mobilization, Joint manipulation, Spinal manipulation, Spinal mobilization, Scar mobilization, and DME instructions.  PLAN FOR NEXT SESSION: progress with reverse TSA protocol, will be 2 weeks out 04/10/24   Josette Rough, PT, DPT 04/06/24 9:28 AM

## 2024-04-09 ENCOUNTER — Ambulatory Visit (HOSPITAL_BASED_OUTPATIENT_CLINIC_OR_DEPARTMENT_OTHER): Admitting: Orthopaedic Surgery

## 2024-04-09 ENCOUNTER — Ambulatory Visit (HOSPITAL_BASED_OUTPATIENT_CLINIC_OR_DEPARTMENT_OTHER)

## 2024-04-09 DIAGNOSIS — R609 Edema, unspecified: Secondary | ICD-10-CM | POA: Diagnosis not present

## 2024-04-09 DIAGNOSIS — G8929 Other chronic pain: Secondary | ICD-10-CM | POA: Diagnosis not present

## 2024-04-09 DIAGNOSIS — M25512 Pain in left shoulder: Secondary | ICD-10-CM

## 2024-04-09 DIAGNOSIS — Z96612 Presence of left artificial shoulder joint: Secondary | ICD-10-CM | POA: Diagnosis not present

## 2024-04-09 DIAGNOSIS — S46011A Strain of muscle(s) and tendon(s) of the rotator cuff of right shoulder, initial encounter: Secondary | ICD-10-CM

## 2024-04-09 NOTE — Progress Notes (Signed)
 Post Operative Evaluation    Procedure/Date of Surgery: Left shoulder reverse shoulder arthroplasty 8/12  Interval History:    Presents 2 weeks status post the above procedure.  Overall range of motion is coming along nicely.  He has been working with physical therapy.  Compliant with sling usage   PMH/PSH/Family History/Social History/Meds/Allergies:    Past Medical History:  Diagnosis Date   Abnormal GGT test    With otherwise normal work up.   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)    Persistent disorder of initiating or maintaining sleep    Pes planus    Rosacea    Snoring    Past Surgical History:  Procedure Laterality Date   APPENDECTOMY  1949   at age 69   CERVICAL FUSION  03/2016   C 3&4   Cyst removed from breast  1960   FOOT SURGERY  01/2009   Left   HERNIA REPAIR  1956   at age 9 (single)   HERNIA REPAIR  1960   (double)   Left 2nd toe removed  03/2017   REVERSE SHOULDER ARTHROPLASTY Left 03/27/2024   Procedure: ARTHROPLASTY, SHOULDER, TOTAL, REVERSE;  Surgeon: Genelle Standing, MD;  Location: [facility];  Service: Orthopedics;  Laterality: Left;   SPINE SURGERY     TONSILLECTOMY AND ADENOIDECTOMY  ~ 1950   VASECTOMY  ~ 1984   Social History   Socioeconomic History   Marital status: Married    Spouse name: Not on file   Number of children: 4   Years of education: Not on file   Highest education level: Master's degree (e.g., MA, MS, MEng, MEd, MSW, MBA)  Occupational History   Occupation: Retired Nov. 2007 27-1/2 years of service    Employer: RETIRED    Comment: Paediatric nurse   Occupation: Works part time at a desk job.  Tobacco Use   Smoking  status: Never   Smokeless tobacco: Former    Types: Engineer, drilling   Vaping status: Never Used  Substance and Sexual Activity   Alcohol use: Yes    Comment: 1-2 glass wine daily   Drug use: No   Sexual activity: Not Currently  Other Topics Concern   Not on file  Social History Narrative   Education:  Masters   Charity fundraiser at 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   4 kids, 2 are MD's   7 grandkids   [employer] and stays active, enjoys skiing, very active, walks at least 1/2 mile daily   Social Drivers of Corporate investment banker Strain: Low Risk  (03/19/2024)   Overall Financial Resource Strain (CARDIA)    Difficulty of Paying Living Expenses: Not very hard  Food Insecurity: No Food Insecurity (03/19/2024)   Hunger Vital Sign    Worried About Running Out of Food in the Last Year: Never true    Ran Out of Food in the Last Year: Never true  Transportation Needs: No Transportation Needs (03/19/2024)  PRAPARE - Administrator, Civil Service (Medical): No    Lack of Transportation (Non-Medical): No  Physical Activity: Sufficiently Active (03/19/2024)   Exercise Vital Sign    Days of Exercise per Week: 7 days    Minutes of Exercise per Session: 30 min  Stress: No Stress Concern Present (03/19/2024)   [employer] of Occupational Health - Occupational Stress Questionnaire    Feeling of Stress: Not at all  Social Connections: Moderately Isolated (03/19/2024)   Social Connection and Isolation Panel    Frequency of Communication with Friends and Family: Twice a week    Frequency of Social Gatherings with Friends and Family: Once a week    Attends Religious Services: Never    Database administrator or Organizations: No    Attends Engineer, structural: Not on file    Marital Status: Married   Family History  Problem Relation Age of Onset   Heart disease Father 37       First MI   Hyperlipidemia Father    Heart attack Father    Diabetes Brother     Hyperlipidemia Brother    Hypertension Brother    Heart disease Brother        CAD s/p stent   CAD Brother    Heart disease Brother    Heart disease Brother    Diabetes Sister        DM2   Hyperlipidemia Sister    Heart disease Sister    Arthritis Other    Diabetes Other    ADD / ADHD Son    Depression Sister    Prostate cancer Neg Hx    Colon cancer Neg Hx    Allergies  Allergen Reactions   Azo [Phenazopyridine] Other (See Comments)    diarrhea   Bee Venom    Erythromycin     Oral ulcers (but tolerates zithromax)    Animator (Solenopsis Invicta) Swelling   Oxycodone      Intolerant- but can tolerate hydrocodone .    Pneumococcal Vaccine Polyvalent     REACTION: rash   Wasp Venom     Exaggerated local reaction   Current Outpatient Medications  Medication Sig Dispense Refill   Accu-Chek FastClix Lancets MISC USE AS NEEDED TO CHECK SUGAR DAILY. 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 102 each 3   aspirin  81 MG EC tablet Take 81 mg by mouth daily.     aspirin  EC 325 MG tablet Take 1 tablet (325 mg total) by mouth daily. (Patient not taking: Reported on 03/20/2024) 14 tablet 0   B Complex-C-Folic Acid (SUPER B COMPLEX/FA/VIT C) TABS      Blood Glucose Monitoring Suppl (ONETOUCH VERIO) w/Device KIT Use to check blood sugar up to 3 times a day. Dx: E11.9 1 kit 0   EPINEPHrine  0.3 mg/0.3 mL IJ SOAJ injection Inject 0.3 mLs (0.3 mg total) into the muscle once. (Patient not taking: Reported on 03/20/2024) 2 Device 1   fluorouracil  (EFUDEX ) 5 % cream Apply topically 2 (two) times daily.     glimepiride  (AMARYL ) 2 MG tablet TAKE 1 TABLET BY MOUTH DAILY  WITH BREAKFAST 90 tablet 3   glucose blood (ONETOUCH VERIO) test strip USE TO CHECK BLOOD SUGAR UP TO 3 TIMES DAILY. Dx E11.9 100 strip 25   HYDROcodone -acetaminophen  (NORCO/VICODIN) 5-325 MG tablet Take 1 tablet by mouth every 6 (six) hours as needed for moderate pain (pain score 4-6). 20 tablet 0   insulin  glargine (LANTUS  SOLOSTAR) 100  UNIT/ML Solostar Pen Inject 7 Units into the skin daily.     Insulin  Pen Needle 30G X 5 MM MISC Use daily with insulin  pen. 100 each 3   metFORMIN  (GLUCOPHAGE ) 1000 MG tablet Take 1 tablet (1,000 mg total) by mouth daily with breakfast. 180 tablet 3   metFORMIN  (GLUCOPHAGE -XR) 500 MG 24 hr tablet Take 2 tablets (1,000 mg total) by mouth at bedtime. 180 tablet 1   nitroGLYCERIN  (NITROSTAT ) 0.4 MG SL tablet Place 1 tablet (0.4 mg total) under the tongue every 5 (five) minutes as needed for chest pain (max 3 doses/15 min.call MD/to ER if used). 25 tablet 3   rosuvastatin  (CRESTOR ) 10 MG tablet Take 1 tablet (10 mg total) by mouth daily. 90 tablet 3   sertraline  (ZOLOFT ) 50 MG tablet Take 1 tablet (50 mg total) by mouth daily. 90 tablet 3   sitaGLIPtin  (JANUVIA ) 100 MG tablet Take 1 tablet (100 mg total) by mouth daily. 90 tablet 3   No current facility-administered medications for this visit.   No results found.  Review of Systems:   A ROS was performed including pertinent positives and negatives as documented in the HPI.   Musculoskeletal Exam:    There were no vitals taken for this visit.  Left shoulder is well-appearing without erythema or drainage.  Active forward elevation is 120 degrees external rotation at side is 20 degrees internal rotation deferred today distal neurosensory exam is intact  Imaging:    3 views left shoulder: This post left reverse shoulder arthroplasty without evidence of complication  I personally reviewed and interpreted the radiographs.   Assessment:   2 weeks status post left reverse shoulder arthroplasty overall doing extremely well.  This time we will continue to work on active range of motion and strengthening.  He will wear his sling at night for 2 additional weeks  Plan :    - Turn to clinic 4 weeks for reassessment      I personally saw and evaluated the patient, and participated in the management and treatment plan.  Elspeth Parker,  MD Attending Physician, Orthopedic Surgery  This document was dictated using Dragon voice recognition software. A reasonable attempt at proof reading has been made to minimize errors.

## 2024-04-11 ENCOUNTER — Encounter (HOSPITAL_BASED_OUTPATIENT_CLINIC_OR_DEPARTMENT_OTHER): Payer: Self-pay | Admitting: Orthopaedic Surgery

## 2024-04-13 ENCOUNTER — Ambulatory Visit (HOSPITAL_BASED_OUTPATIENT_CLINIC_OR_DEPARTMENT_OTHER): Admitting: Physical Therapy

## 2024-04-13 ENCOUNTER — Encounter (HOSPITAL_BASED_OUTPATIENT_CLINIC_OR_DEPARTMENT_OTHER): Payer: Self-pay | Admitting: Physical Therapy

## 2024-04-13 DIAGNOSIS — S46011A Strain of muscle(s) and tendon(s) of the rotator cuff of right shoulder, initial encounter: Secondary | ICD-10-CM | POA: Diagnosis not present

## 2024-04-13 DIAGNOSIS — M25512 Pain in left shoulder: Secondary | ICD-10-CM | POA: Diagnosis not present

## 2024-04-13 DIAGNOSIS — R29898 Other symptoms and signs involving the musculoskeletal system: Secondary | ICD-10-CM | POA: Diagnosis not present

## 2024-04-13 DIAGNOSIS — M6281 Muscle weakness (generalized): Secondary | ICD-10-CM

## 2024-04-13 DIAGNOSIS — M25612 Stiffness of left shoulder, not elsewhere classified: Secondary | ICD-10-CM

## 2024-04-13 NOTE — Therapy (Signed)
 OUTPATIENT PHYSICAL THERAPY SHOULDER TREATMENT    Patient Name: Greg Rogers. MRN: [id number] DOB:15-Mar-1941, 83 y.o., male Today's Date: 04/13/2024  END OF SESSION:  PT End of Session - 04/13/24 0913     Visit Number 3    Number of Visits 24    Date for PT Re-Evaluation 06/22/24    Authorization Type MCR    Progress Note Due on Visit 10    PT Start Time 0847    PT Stop Time 0926    PT Time Calculation (min) 39 min    Activity Tolerance Patient tolerated treatment well    Behavior During Therapy WFL for tasks assessed/performed            Past Medical History:  Diagnosis Date   Abnormal GGT test    With otherwise normal work up.   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)    Persistent disorder of initiating or maintaining sleep    Pes planus    Rosacea    Snoring    Past Surgical History:  Procedure Laterality Date   APPENDECTOMY  1949   at age 24   CERVICAL FUSION  03/2016   C 3&4   Cyst removed from breast  1960   FOOT SURGERY  01/2009   Left   HERNIA REPAIR  1956   at age 8 (single)   HERNIA REPAIR  1960   (double)   Left 2nd toe removed  03/2017   REVERSE SHOULDER ARTHROPLASTY Left 03/27/2024   Procedure: ARTHROPLASTY, SHOULDER, TOTAL, REVERSE;  Surgeon: Genelle Standing, MD;  Location: [facility];  Service: Orthopedics;  Laterality: Left;   SPINE SURGERY     TONSILLECTOMY AND ADENOIDECTOMY  ~ 1950   VASECTOMY  ~ 1984   Patient Active Problem List   Diagnosis Date Noted   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 03/27/2024   Leg pain 03/21/2024   Shoulder pain 12/07/2023   Change in stool 11/02/2023   Nasal lesion 06/02/2023   Occipital neuralgia 10/12/2020   Wound of right leg, initial encounter  07/03/2019   Healthcare maintenance 03/04/2017   Foot pain 07/22/2016   Cervical stenosis of spine 01/06/2016   thyroid  nodule-benign path on bx 01/2016 01/06/2016   Radicular pain in right arm 12/22/2015   Radicular leg pain 12/22/2015   Back pain 07/06/2015   Constipation 07/06/2015   FH: CAD (coronary artery disease) 05/23/2015   Family history of ischemic heart disease and other diseases of the circulatory system 05/23/2015   Advance care planning 11/26/2014   Pain in joint, shoulder region 11/26/2014   Medicare annual wellness visit, subsequent 09/27/2012   GERD (gastroesophageal reflux disease) 07/05/2012   Irritability 03/29/2011   Advance directive on file 01/09/2011   Pain in joint, other site 01/04/2011   Arthropathy 09/02/2010   NEPHROLITHIASIS, HX OF 09/02/2010   Diabetes mellitus without complication (HCC) 09/01/2010   HLD (hyperlipidemia) 10/24/2009   SNORING 10/24/2009   FOOT PAIN, BILATERAL 10/06/2009   Pes planus 10/06/2009   Acquired flat foot 10/06/2009    PCP: Cleatus Arlyss RAMAN, MD  REFERRING PROVIDER: Genelle Standing, MD  REFERRING DIAG: S46.012A (ICD-10-CM) - Traumatic complete tear of left  rotator cuff, initial encounter;  1. Left shoulder reverse shoulder arthroplasty 2. Left shoulder biceps tenodesis  THERAPY DIAG:  Acute pain of left shoulder  Muscle weakness (generalized)  Stiffness of left shoulder, not elsewhere classified  Other symptoms and signs involving the musculoskeletal system  Rationale for Evaluation and Treatment: Rehabilitation  ONSET DATE: DOS 03/27/24  SUBJECTIVE:                                                                                                                                                                                      SUBJECTIVE STATEMENT:   I didn't remember all the exercises we did last time, tried to call you to clarify but you weren't here- Dr. Genelle was happy with how my shoulder was doing,  took the stitches out.    EVAL: Patient states shoulder has been doing good. Using sling. Was very active prior to injury/surgery with gardening/yard work Hand dominance: Right  PERTINENT HISTORY: DM, HLD, chronic shoulder pain with fall in March while exiting ski lift with RC tear  PAIN:  Are you having pain? No 0/10 now, has stayed at 0/10 during the past week   PRECAUTIONS: Other: L reverse TSA   RED FLAGS: None   WEIGHT BEARING RESTRICTIONS: NWB LUE  FALLS:  Has patient fallen in last 6 months? Yes. Number of falls 1  PLOF: Independent  PATIENT GOALS:regain use of the arm  OBJECTIVE: (objective measures from initial evaluation unless otherwise dated)  OBSERVATION: using sling, waterproof dressing intact  PATIENT SURVEYS:  UEFS  Extreme difficulty/unable (0), Quite a bit of difficulty (1), Moderate difficulty (2), Little difficulty (3), No difficulty (4) Survey date:  03/30/24  Any of your usual work, household or school activities 0  2. Your usual hobbies, recreational/sport activities 1   3. Lifting a bag of groceries to waist level 2   4. Lifting a bag of groceries above your head 0  5. Grooming your hair 4  6. Pushing up on your hands (I.e. from bathtub or chair) 0  7. Preparing food (I.e. peeling/cutting) 1  8. Driving  3  9. Vacuuming, sweeping, or raking 4  10. Dressing  3  11. Doing up buttons 2  12. Using tools/appliances 1  13. Opening doors 0  14. Cleaning  3  15. Tying or lacing shoes 2  16. Sleeping  3  17. Laundering clothes (I.e. washing, ironing, folding) 3  18. Opening a jar 3  19. Throwing a ball 1  20. Carrying a small suitcase with your affected limb.  1  Score total:  35/80     COGNITION: Overall cognitive status: Within functional limits for  tasks assessed     SENSATION: WFL  POSTURE: rounded shoulders and forward head   UPPER EXTREMITY ROM: AROM RUE WFL  Passive ROM Left eval L 04/06/24 L 04/13/24  Shoulder flexion 100  PROM 110 AAROM 110* PROM, 120* AAROM supine  120-130* PROM (bit guarded today)  Shoulder extension     Shoulder abduction     Shoulder adduction     Shoulder internal rotation     Shoulder external rotation 20 25* next to body PROM setaed (very guarded), 30* AAROM seated  30* PROM arm next to body   Elbow flexion     Elbow extension     Wrist flexion     Wrist extension     Wrist ulnar deviation     Wrist radial deviation     Wrist pronation     Wrist supination     (Blank rows = not tested) *=pain/symptoms  UPPER EXTREMITY MMT: NT due to post op status  MMT Right eval Left eval  Shoulder flexion    Shoulder extension    Shoulder abduction    Shoulder adduction    Shoulder internal rotation    Shoulder external rotation    Middle trapezius    Lower trapezius    Elbow flexion    Elbow extension    Wrist flexion    Wrist extension    Wrist ulnar deviation    Wrist radial deviation    Wrist pronation    Wrist supination    Grip strength (lbs)    (Blank rows = not tested) *=pain/symptoms    PALPATION:  LUE gross edema   TODAY'S TREATMENT:                                                                                                                                         DATE:    04/13/24  PROM/stretching as appropriate supine Supine AAROM x15 cane Supine AAROM scaption with cane x15 Supine chest press cane x15  Standing isometrics: flexion 12x3 seconds, ABD 12x3 seconds, extension 12x3 seconds, ER 12x3 seconds door frame   Education on post-op recovery and long term pace of regaining ROM/strength/full function, typical post-op limitations and sensations (3-4 months to functional, about a year for max recovery/function)    04/06/24   Education/corrections given on HEP form, reassured him that getting ROM back especially for ER after shoulder surgery does take time, reviewed reverse TSA precautions  PROM as appropriate supine  Supine AAROM flexion x12  with cane  Supine chest presses with cane x12  Supine AAROM scaption x12 with cane, MinA from PT  Supine hand closing/opening with shoulder at 90* to help reduce hand edema  Seated scap retractions 20x3 seconds  Seated backward shoulder rolls x20  Seated ER AAROM seated sheet tucked to ribs cane x12     03/30/24 Eval, dressing change, HEP, education Wrist hand AROM Elbow AROM  1 x 10 Scap retraction 1 x 10 Seated AAROM ER 1 x 10 Supine AAROM well arm assisted flexion 1 x 10    PATIENT EDUCATION:  Education details: Patient educated on exam findings, POC, scope of PT, HEP, relevant anatomy/biomechanics, protocol/precautions. Person educated: Patient Education method: Explanation, Demonstration, and Handouts Education comprehension: verbalized understanding, returned demonstration, verbal cues required, and tactile cues required  HOME EXERCISE PROGRAM:  Access Code: [email or web address] URL: [email or web address] Date: 04/13/2024 Prepared by: Josette Rough  Exercises - Wrist AROM Flexion Extension  - 3 x daily - 7 x weekly - 2 sets - 10 reps - Seated Elbow Flexion and Extension AROM (Mirrored)  - 3 x daily - 7 x weekly - 2 sets - 10 reps - Circular Shoulder Pendulum with Table Support (Mirrored)  - 3 x daily - 7 x weekly - 2 sets - 10 reps - Seated Scapular Retraction  - 3 x daily - 7 x weekly - 2 sets - 10 reps - Seated Shoulder Flexion Towel Slide at Table Top (Mirrored)  - 3 x daily - 7 x weekly - 10 reps - 10 second hold - Supine Shoulder Flexion AAROM with Hands Clasped  - 3 x daily - 7 x weekly - 3 sets - 10 reps - Supine Shoulder External Rotation with Dowel  - 3 x daily - 7 x weekly - 2 sets - 10 reps - Standing Isometric Shoulder Flexion with Doorway - Arm Bent  - 1 x daily - 7 x weekly - 2 sets - 10 reps - 3 seconds  hold - Standing Isometric Shoulder Abduction with Doorway - Arm Bent  - 1 x daily - 7 x weekly - 2 sets - 10 reps - 3 seconds  hold - Standing  Isometric Shoulder Extension with Doorway - Arm Bent  - 1 x daily - 7 x weekly - 2 sets - 10 reps - 3 seconds  hold - Standing Isometric Shoulder External Rotation with Doorway  - 1 x daily - 7 x weekly - 2 sets - 10 reps - 3 seconds  hold   ASSESSMENT:  CLINICAL IMPRESSION:  Arrives today doing well, we progressed interventions as appropriate given stage of healing. He saw the surgeon on Monday, and MD is happy with his progress. Provided HEP updates as appropriate. Will continue to challenge and progress as appropriate.    EVAL: Patient a 83 y.o. y.o. male who was seen today for physical therapy evaluation and treatment for L reverse TSA DOS 03/27/24. Patient presents with pain limited deficits in L shoulder strength, ROM, endurance, activity tolerance, and functional mobility with ADL. Patient is having to modify and restrict ADL as indicated by outcome measure score as well as subjective information and objective measures which is affecting overall participation. Patient will benefit from skilled physical therapy in order to improve function and reduce impairment.  OBJECTIVE IMPAIRMENTS: decreased activity tolerance, decreased endurance, decreased mobility, decreased ROM, decreased strength, increased muscle spasms, impaired flexibility, impaired UE functional use, improper body mechanics, and pain  ACTIVITY LIMITATIONS:  carrying, lifting, bending, sleeping, bed mobility, bathing, dressing, reach over head, hygiene/grooming, and caring for others  PARTICIPATION LIMITATIONS:  meal prep, cleaning, laundry, driving, shopping, community activity, and yard work  PERSONAL FACTORS: Age, Time since onset of injury/illness/exacerbation, and 1-2 comorbidities: DM, HLD are also affecting patient's functional outcome.   REHAB POTENTIAL: Good  CLINICAL DECISION MAKING: Evolving/moderate complexity  EVALUATION COMPLEXITY: Moderate  GOALS: Goals reviewed with patient? Yes  SHORT TERM  GOALS: Target  date: 05/11/2024    Patient will be independent with HEP in order to improve functional outcomes. Baseline: Goal status: IN PROGRESS 04/06/24  2.  Patient will report at least 25% improvement in symptoms for improved quality of life. Baseline:  Goal status: INITIAL  3.  Patient will demonstrate Elevation to 130 deg and ER to 30 deg - passive, active assisted or active  Baseline:  Goal status: IN PROGRESS  4.  Patient will be out of sling per surgeon approval for improved functional use of L shoulder. Baseline:  Goal status: MET 04/13/24   LONG TERM GOALS: Target date: 06/22/2024    Patient will report at least 75% improvement in symptoms for improved quality of life. Baseline:  Goal status: INITIAL  2.  Patient will improve UEFS score by at least 25 points in order to indicate improved tolerance to activity. Baseline:  Goal status: INITIAL  3.  Patient will demonstrate at least PROM/AAROM/AROM to: Elevation - 145-160; ER - 40-50 ; functional IR to L1 in L shoulder for improved ability lift overhead. Baseline:  Goal status: INITIAL  4.  Patient will be able to return to all activities unrestricted for improved ability to perform yard work functions and participate with family.  Baseline:  Goal status: INITIAL  5.  Patient will demonstrate grade of 5/5 MMT grade in all tested musculature as evidence of improved strength to assist with lifting at home. Baseline:  Goal status: INITIAL   PLAN:  PT FREQUENCY: 1-2x/week  PT DURATION: 12 weeks  PLANNED INTERVENTIONS:[id number]- PT Re-evaluation, 97110-Therapeutic exercises, 97530- Therapeutic activity, [id number]- Neuromuscular re-education, 97535- Self Care, [id number]- Manual therapy, [phone]- Gait training, [phone]- Orthotic Fit/training, [phone]- Canalith repositioning, [id number]- Aquatic Therapy, [phone]- Splinting, [phone]- Wound care (first 20 sq cm), 97598- Wound care (each additional 20 sq cm)Patient/Family education, Balance training, Stair  training, Taping, Dry Needling, Joint mobilization, Joint manipulation, Spinal manipulation, Spinal mobilization, Scar mobilization, and DME instructions.  PLAN FOR NEXT SESSION: progress with reverse TSA protocol, will be 3 weeks out 04/17/24. OK to do active ROM and strength per MD note 04/09/24   Josette Rough, PT, DPT 04/13/24 9:28 AM

## 2024-04-17 ENCOUNTER — Encounter: Payer: Self-pay | Admitting: Family Medicine

## 2024-04-20 ENCOUNTER — Encounter (HOSPITAL_BASED_OUTPATIENT_CLINIC_OR_DEPARTMENT_OTHER): Payer: Self-pay | Admitting: Physical Therapy

## 2024-04-20 ENCOUNTER — Ambulatory Visit (HOSPITAL_BASED_OUTPATIENT_CLINIC_OR_DEPARTMENT_OTHER): Attending: Orthopaedic Surgery | Admitting: Physical Therapy

## 2024-04-20 DIAGNOSIS — R29898 Other symptoms and signs involving the musculoskeletal system: Secondary | ICD-10-CM | POA: Insufficient documentation

## 2024-04-20 DIAGNOSIS — M25612 Stiffness of left shoulder, not elsewhere classified: Secondary | ICD-10-CM | POA: Diagnosis present

## 2024-04-20 DIAGNOSIS — M7542 Impingement syndrome of left shoulder: Secondary | ICD-10-CM | POA: Insufficient documentation

## 2024-04-20 DIAGNOSIS — M6281 Muscle weakness (generalized): Secondary | ICD-10-CM | POA: Insufficient documentation

## 2024-04-20 DIAGNOSIS — M25512 Pain in left shoulder: Secondary | ICD-10-CM | POA: Diagnosis present

## 2024-04-20 NOTE — Therapy (Signed)
 OUTPATIENT PHYSICAL THERAPY SHOULDER TREATMENT    Patient Name: Greg Rogers. MRN: [id number] DOB:1941-07-23, 83 y.o., male Today's Date: 04/20/2024  END OF SESSION:  PT End of Session - 04/20/24 0951     Visit Number 4    Number of Visits 24    Date for PT Re-Evaluation 06/22/24    Authorization Type MCR    Progress Note Due on Visit 10    PT Start Time 0932    PT Stop Time 1010    PT Time Calculation (min) 38 min    Activity Tolerance Patient tolerated treatment well    Behavior During Therapy WFL for tasks assessed/performed             Past Medical History:  Diagnosis Date   Abnormal GGT test    With otherwise normal work up.   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)    Persistent disorder of initiating or maintaining sleep    Pes planus    Rosacea    Snoring    Past Surgical History:  Procedure Laterality Date   APPENDECTOMY  1949   at age 30   CERVICAL FUSION  03/2016   C 3&4   Cyst removed from breast  1960   FOOT SURGERY  01/2009   Left   HERNIA REPAIR  1956   at age 70 (single)   HERNIA REPAIR  1960   (double)   Left 2nd toe removed  03/2017   REVERSE SHOULDER ARTHROPLASTY Left 03/27/2024   Procedure: ARTHROPLASTY, SHOULDER, TOTAL, REVERSE;  Surgeon: Genelle Standing, MD;  Location: [facility];  Service: Orthopedics;  Laterality: Left;   SPINE SURGERY     TONSILLECTOMY AND ADENOIDECTOMY  ~ 1950   VASECTOMY  ~ 1984   Patient Active Problem List   Diagnosis Date Noted   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 03/27/2024   Leg pain 03/21/2024   Shoulder pain 12/07/2023   Change in stool 11/02/2023   Nasal lesion 06/02/2023   Occipital neuralgia 10/12/2020   Wound of right leg, initial encounter  07/03/2019   Healthcare maintenance 03/04/2017   Foot pain 07/22/2016   Cervical stenosis of spine 01/06/2016   thyroid  nodule-benign path on bx 01/2016 01/06/2016   Radicular pain in right arm 12/22/2015   Radicular leg pain 12/22/2015   Back pain 07/06/2015   Constipation 07/06/2015   FH: CAD (coronary artery disease) 05/23/2015   Family history of ischemic heart disease and other diseases of the circulatory system 05/23/2015   Advance care planning 11/26/2014   Pain in joint, shoulder region 11/26/2014   Medicare annual wellness visit, subsequent 09/27/2012   GERD (gastroesophageal reflux disease) 07/05/2012   Irritability 03/29/2011   Advance directive on file 01/09/2011   Pain in joint, other site 01/04/2011   Arthropathy 09/02/2010   NEPHROLITHIASIS, HX OF 09/02/2010   Diabetes mellitus without complication (HCC) 09/01/2010   HLD (hyperlipidemia) 10/24/2009   SNORING 10/24/2009   FOOT PAIN, BILATERAL 10/06/2009   Pes planus 10/06/2009   Acquired flat foot 10/06/2009    PCP: Cleatus Arlyss RAMAN, MD  REFERRING PROVIDER: Genelle Standing, MD  REFERRING DIAG: S46.012A (ICD-10-CM) - Traumatic complete tear of  left rotator cuff, initial encounter;  1. Left shoulder reverse shoulder arthroplasty 2. Left shoulder biceps tenodesis  THERAPY DIAG:  Acute pain of left shoulder  Muscle weakness (generalized)  Stiffness of left shoulder, not elsewhere classified  Other symptoms and signs involving the musculoskeletal system  Rationale for Evaluation and Treatment: Rehabilitation  ONSET DATE: DOS 03/27/24  SUBJECTIVE:                                                                                                                                                                                      SUBJECTIVE STATEMENT:   Feeling good, noticing some aching with the exercises but its not too much. Shoulder is doing well. MD wanted me to stretch shoulder out in the mornings and in  the shower    EVAL: Patient states shoulder has been doing good. Using sling. Was very active prior to injury/surgery with gardening/yard work Hand dominance: Right  PERTINENT HISTORY: DM, HLD, chronic shoulder pain with fall in March while exiting ski lift with RC tear  PAIN:  Are you having pain? No 0/10  today, 0/10 over past week as well   PRECAUTIONS: Other: L reverse TSA   RED FLAGS: None   WEIGHT BEARING RESTRICTIONS: NWB LUE  FALLS:  Has patient fallen in last 6 months? Yes. Number of falls 1  PLOF: Independent  PATIENT GOALS:regain use of the arm  OBJECTIVE: (objective measures from initial evaluation unless otherwise dated)  OBSERVATION: using sling, waterproof dressing intact  PATIENT SURVEYS:  UEFS  Extreme difficulty/unable (0), Quite a bit of difficulty (1), Moderate difficulty (2), Little difficulty (3), No difficulty (4) Survey date:  03/30/24  Any of your usual work, household or school activities 0  2. Your usual hobbies, recreational/sport activities 1   3. Lifting a bag of groceries to waist level 2   4. Lifting a bag of groceries above your head 0  5. Grooming your hair 4  6. Pushing up on your hands (I.e. from bathtub or chair) 0  7. Preparing food (I.e. peeling/cutting) 1  8. Driving  3  9. Vacuuming, sweeping, or raking 4  10. Dressing  3  11. Doing up buttons 2  12. Using tools/appliances 1  13. Opening doors 0  14. Cleaning  3  15. Tying or lacing shoes 2  16. Sleeping  3  17. Laundering clothes (I.e. washing, ironing, folding) 3  18. Opening a jar 3  19. Throwing a ball 1  20. Carrying a small suitcase with your affected limb.  1  Score total:  35/80     COGNITION: Overall cognitive status: Within functional limits for tasks assessed  SENSATION: WFL  POSTURE: rounded shoulders and forward head   UPPER EXTREMITY ROM: AROM RUE WFL  Passive ROM Left eval L 04/06/24 L 04/13/24 L 04/20/24  Shoulder flexion 100 PROM 110 AAROM  110* PROM, 120* AAROM supine  120-130* PROM (bit guarded today) 130* PROM and AROM   Shoulder extension      Shoulder abduction      Shoulder adduction      Shoulder internal rotation      Shoulder external rotation 20 25* next to body PROM setaed (very guarded), 30* AAROM seated  30* PROM arm next to body  30* PROM arm next to body   Elbow flexion      Elbow extension      Wrist flexion      Wrist extension      Wrist ulnar deviation      Wrist radial deviation      Wrist pronation      Wrist supination      (Blank rows = not tested) *=pain/symptoms  UPPER EXTREMITY MMT: NT due to post op status  MMT Right eval Left eval  Shoulder flexion    Shoulder extension    Shoulder abduction    Shoulder adduction    Shoulder internal rotation    Shoulder external rotation    Middle trapezius    Lower trapezius    Elbow flexion    Elbow extension    Wrist flexion    Wrist extension    Wrist ulnar deviation    Wrist radial deviation    Wrist pronation    Wrist supination    Grip strength (lbs)    (Blank rows = not tested) *=pain/symptoms    PALPATION:  LUE gross edema   TODAY'S TREATMENT:                                                                                                                                         DATE:    04/20/24  PROM/stretching as appropriate supine; backed off on ER stretching even within current 30* limit, this was a bit more tender today Supine AROM for flexion 2x10 0# Sidelying AROM for ABD just to shoulder height 2x10 0# Supine serratus punches 2# x10  Supine serratus punches 2# CW/CCW circles x10 each way  Supine serratus punch + alphabet x1 with 2#  Pulleys for L shoulder elevation 15x3 second holds Scap retractions 20x3 seconds min tactile cues for form  Backward shoulder rolls x20 Min VC for good form       04/13/24  PROM/stretching as appropriate supine Supine AAROM x15 cane Supine AAROM scaption with cane x15 Supine  chest press cane x15  Standing isometrics: flexion 12x3 seconds, ABD 12x3 seconds, extension 12x3 seconds, ER 12x3 seconds door frame   Education on post-op recovery and long term pace of regaining ROM/strength/full function, typical post-op limitations and sensations (3-4 months to  functional, about a year for max recovery/function)    04/06/24   Education/corrections given on HEP form, reassured him that getting ROM back especially for ER after shoulder surgery does take time, reviewed reverse TSA precautions  PROM as appropriate supine  Supine AAROM flexion x12 with cane  Supine chest presses with cane x12  Supine AAROM scaption x12 with cane, MinA from PT  Supine hand closing/opening with shoulder at 90* to help reduce hand edema  Seated scap retractions 20x3 seconds  Seated backward shoulder rolls x20  Seated ER AAROM seated sheet tucked to ribs cane x12     03/30/24 Eval, dressing change, HEP, education Wrist hand AROM Elbow AROM 1 x 10 Scap retraction 1 x 10 Seated AAROM ER 1 x 10 Supine AAROM well arm assisted flexion 1 x 10    PATIENT EDUCATION:  Education details: Patient educated on exam findings, POC, scope of PT, HEP, relevant anatomy/biomechanics, protocol/precautions. Person educated: Patient Education method: Explanation, Demonstration, and Handouts Education comprehension: verbalized understanding, returned demonstration, verbal cues required, and tactile cues required  HOME EXERCISE PROGRAM:  Access Code: [email or web address] URL: [email or web address] Date: 04/13/2024 Prepared by: Josette Rough  Exercises - Wrist AROM Flexion Extension  - 3 x daily - 7 x weekly - 2 sets - 10 reps - Seated Elbow Flexion and Extension AROM (Mirrored)  - 3 x daily - 7 x weekly - 2 sets - 10 reps - Circular Shoulder Pendulum with Table Support (Mirrored)  - 3 x daily - 7 x weekly - 2 sets - 10 reps - Seated Scapular Retraction  - 3 x daily - 7 x weekly - 2 sets - 10  reps - Seated Shoulder Flexion Towel Slide at Table Top (Mirrored)  - 3 x daily - 7 x weekly - 10 reps - 10 second hold - Supine Shoulder Flexion AAROM with Hands Clasped  - 3 x daily - 7 x weekly - 3 sets - 10 reps - Supine Shoulder External Rotation with Dowel  - 3 x daily - 7 x weekly - 2 sets - 10 reps - Standing Isometric Shoulder Flexion with Doorway - Arm Bent  - 1 x daily - 7 x weekly - 2 sets - 10 reps - 3 seconds  hold - Standing Isometric Shoulder Abduction with Doorway - Arm Bent  - 1 x daily - 7 x weekly - 2 sets - 10 reps - 3 seconds  hold - Standing Isometric Shoulder Extension with Doorway - Arm Bent  - 1 x daily - 7 x weekly - 2 sets - 10 reps - 3 seconds  hold - Standing Isometric Shoulder External Rotation with Doorway  - 1 x daily - 7 x weekly - 2 sets - 10 reps - 3 seconds  hold   ASSESSMENT:  CLINICAL IMPRESSION:  Arrives today doing well, we cautiously progressed interventions today as per surgeon's protocol and most recent office note. He did have a little more guarding and soreness with ER ROM today, advised him to not push this motion too far on his own to protect tissues involved in surgery. Seems to be progressing well overall, updated STGs as appropriate today.    EVAL: Patient a 83 y.o. y.o. male who was seen today for physical therapy evaluation and treatment for L reverse TSA DOS 03/27/24. Patient presents with pain limited deficits in L shoulder strength, ROM, endurance, activity tolerance, and functional mobility with ADL. Patient is having to modify and restrict ADL as indicated by outcome  measure score as well as subjective information and objective measures which is affecting overall participation. Patient will benefit from skilled physical therapy in order to improve function and reduce impairment.  OBJECTIVE IMPAIRMENTS: decreased activity tolerance, decreased endurance, decreased mobility, decreased ROM, decreased strength, increased muscle spasms, impaired  flexibility, impaired UE functional use, improper body mechanics, and pain  ACTIVITY LIMITATIONS:  carrying, lifting, bending, sleeping, bed mobility, bathing, dressing, reach over head, hygiene/grooming, and caring for others  PARTICIPATION LIMITATIONS:  meal prep, cleaning, laundry, driving, shopping, community activity, and yard work  PERSONAL FACTORS: Age, Time since onset of injury/illness/exacerbation, and 1-2 comorbidities: DM, HLD are also affecting patient's functional outcome.   REHAB POTENTIAL: Good  CLINICAL DECISION MAKING: Evolving/moderate complexity  EVALUATION COMPLEXITY: Moderate  GOALS: Goals reviewed with patient? Yes  SHORT TERM GOALS: Target date: 05/11/2024    Patient will be independent with HEP in order to improve functional outcomes. Baseline: Goal status: IN PROGRESS 04/06/24  2.  Patient will report at least 25% improvement in symptoms for improved quality of life. Baseline:  Goal status: IN PROGRESS 04/20/24  3.  Patient will demonstrate Elevation to 130 deg and ER to 30 deg - passive, active assisted or active  Baseline:  Goal status: MET 04/20/24  4.  Patient will be out of sling per surgeon approval for improved functional use of L shoulder. Baseline:  Goal status: MET 04/13/24   LONG TERM GOALS: Target date: 06/22/2024    Patient will report at least 75% improvement in symptoms for improved quality of life. Baseline:  Goal status: INITIAL  2.  Patient will improve UEFS score by at least 25 points in order to indicate improved tolerance to activity. Baseline:  Goal status: INITIAL  3.  Patient will demonstrate at least PROM/AAROM/AROM to: Elevation - 145-160; ER - 40-50 ; functional IR to L1 in L shoulder for improved ability lift overhead. Baseline:  Goal status: INITIAL  4.  Patient will be able to return to all activities unrestricted for improved ability to perform yard work functions and participate with family.  Baseline:  Goal  status: INITIAL  5.  Patient will demonstrate grade of 5/5 MMT grade in all tested musculature as evidence of improved strength to assist with lifting at home. Baseline:  Goal status: INITIAL   PLAN:  PT FREQUENCY: 1-2x/week  PT DURATION: 12 weeks  PLANNED INTERVENTIONS:[id number]- PT Re-evaluation, 97110-Therapeutic exercises, 97530- Therapeutic activity, [id number]- Neuromuscular re-education, 97535- Self Care, [id number]- Manual therapy, [phone]- Gait training, [phone]- Orthotic Fit/training, [phone]- Canalith repositioning, [id number]- Aquatic Therapy, [phone]- Splinting, [phone]- Wound care (first 20 sq cm), 97598- Wound care (each additional 20 sq cm)Patient/Family education, Balance training, Stair training, Taping, Dry Needling, Joint mobilization, Joint manipulation, Spinal manipulation, Spinal mobilization, Scar mobilization, and DME instructions.  PLAN FOR NEXT SESSION: progress with reverse TSA protocol,  3 weeks out as of  04/17/24. OK to do active ROM and strength per MD note 04/09/24   Josette Rough, PT, DPT 04/20/24 10:11 AM

## 2024-04-22 ENCOUNTER — Other Ambulatory Visit: Payer: Self-pay | Admitting: Family Medicine

## 2024-04-22 DIAGNOSIS — E119 Type 2 diabetes mellitus without complications: Secondary | ICD-10-CM

## 2024-04-24 ENCOUNTER — Encounter (HOSPITAL_BASED_OUTPATIENT_CLINIC_OR_DEPARTMENT_OTHER): Payer: Self-pay | Admitting: Physical Therapy

## 2024-04-24 ENCOUNTER — Other Ambulatory Visit (HOSPITAL_BASED_OUTPATIENT_CLINIC_OR_DEPARTMENT_OTHER): Payer: Self-pay

## 2024-04-24 ENCOUNTER — Ambulatory Visit (HOSPITAL_BASED_OUTPATIENT_CLINIC_OR_DEPARTMENT_OTHER): Admitting: Physical Therapy

## 2024-04-24 ENCOUNTER — Encounter: Payer: Self-pay | Admitting: Family Medicine

## 2024-04-24 DIAGNOSIS — M7542 Impingement syndrome of left shoulder: Secondary | ICD-10-CM | POA: Diagnosis not present

## 2024-04-24 DIAGNOSIS — M6281 Muscle weakness (generalized): Secondary | ICD-10-CM

## 2024-04-24 DIAGNOSIS — M25512 Pain in left shoulder: Secondary | ICD-10-CM | POA: Diagnosis not present

## 2024-04-24 DIAGNOSIS — R29898 Other symptoms and signs involving the musculoskeletal system: Secondary | ICD-10-CM

## 2024-04-24 DIAGNOSIS — M25612 Stiffness of left shoulder, not elsewhere classified: Secondary | ICD-10-CM

## 2024-04-24 DIAGNOSIS — Z23 Encounter for immunization: Secondary | ICD-10-CM | POA: Diagnosis not present

## 2024-04-24 MED ORDER — FLUZONE HIGH-DOSE 0.5 ML IM SUSY
0.5000 mL | PREFILLED_SYRINGE | Freq: Once | INTRAMUSCULAR | 0 refills | Status: AC
Start: 2024-04-24 — End: 2024-04-25
  Filled 2024-04-24: qty 0.5, 1d supply, fill #0

## 2024-04-24 NOTE — Therapy (Signed)
 OUTPATIENT PHYSICAL THERAPY SHOULDER TREATMENT    Patient Name: Greg Rogers. MRN: [id number] DOB:[birth year], 83 y.o., male Today's Date: 04/24/2024  END OF SESSION:  PT End of Session - 04/24/24 0848     Visit Number 5    Number of Visits 24    Date for PT Re-Evaluation 06/22/24    Authorization Type MCR    Progress Note Due on Visit 10    PT Start Time 0848    PT Stop Time 0928    PT Time Calculation (min) 40 min    Activity Tolerance Patient tolerated treatment well    Behavior During Therapy WFL for tasks assessed/performed             Past Medical History:  Diagnosis Date   Abnormal GGT test    With otherwise normal work up.   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)    Persistent disorder of initiating or maintaining sleep    Pes planus    Rosacea    Snoring    Past Surgical History:  Procedure Laterality Date   APPENDECTOMY  1949   at age 11   CERVICAL FUSION  03/2016   C 3&4   Cyst removed from breast  1960   FOOT SURGERY  01/2009   Left   HERNIA REPAIR  1956   at age 76 (single)   HERNIA REPAIR  1960   (double)   Left 2nd toe removed  03/2017   REVERSE SHOULDER ARTHROPLASTY Left 03/27/2024   Procedure: ARTHROPLASTY, SHOULDER, TOTAL, REVERSE;  Surgeon: Genelle Standing, MD;  Location: [facility];  Service: Orthopedics;  Laterality: Left;   SPINE SURGERY     TONSILLECTOMY AND ADENOIDECTOMY  ~ 1950   VASECTOMY  ~ 1984   Patient Active Problem List   Diagnosis Date Noted   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 03/27/2024   Leg pain 03/21/2024   Shoulder pain 12/07/2023   Change in stool 11/02/2023   Nasal lesion 06/02/2023   Occipital neuralgia 10/12/2020   Wound of right leg, initial encounter  07/03/2019   Healthcare maintenance 03/04/2017   Foot pain 07/22/2016   Cervical stenosis of spine 01/06/2016   thyroid  nodule-benign path on bx 01/2016 01/06/2016   Radicular pain in right arm 12/22/2015   Radicular leg pain 12/22/2015   Back pain 07/06/2015   Constipation 07/06/2015   FH: CAD (coronary artery disease) 05/23/2015   Family history of ischemic heart disease and other diseases of the circulatory system 05/23/2015   Advance care planning 11/26/2014   Pain in joint, shoulder region 11/26/2014   Medicare annual wellness visit, subsequent 09/27/2012   GERD (gastroesophageal reflux disease) 07/05/2012   Irritability 03/29/2011   Advance directive on file 01/09/2011   Pain in joint, other site 01/04/2011   Arthropathy 09/02/2010   NEPHROLITHIASIS, HX OF 09/02/2010   Diabetes mellitus without complication (HCC) 09/01/2010   HLD (hyperlipidemia) 10/24/2009   SNORING 10/24/2009   FOOT PAIN, BILATERAL 10/06/2009   Pes planus 10/06/2009   Acquired flat foot 10/06/2009    PCP: Cleatus Arlyss RAMAN, MD  REFERRING PROVIDER: Genelle Standing, MD  REFERRING DIAG: S46.012A (ICD-10-CM) - Traumatic complete tear of  left rotator cuff, initial encounter;  1. Left shoulder reverse shoulder arthroplasty 2. Left shoulder biceps tenodesis  THERAPY DIAG:  Acute pain of left shoulder  Muscle weakness (generalized)  Stiffness of left shoulder, not elsewhere classified  Other symptoms and signs involving the musculoskeletal system  Rationale for Evaluation and Treatment: Rehabilitation  ONSET DATE: DOS 03/27/24  SUBJECTIVE:                                                                                                                                                                                      SUBJECTIVE STATEMENT: Patient states  shoulder has been feeling good.    EVAL: Patient states shoulder has been doing good. Using sling. Was very active prior to injury/surgery with  gardening/yard work Hand dominance: Right  PERTINENT HISTORY: DM, HLD, chronic shoulder pain with fall in March while exiting ski lift with RC tear  PAIN:  Are you having pain? No 0/10  today, 0/10 over past week as well   PRECAUTIONS: Other: L reverse TSA   RED FLAGS: None   WEIGHT BEARING RESTRICTIONS: NWB LUE  FALLS:  Has patient fallen in last 6 months? Yes. Number of falls 1  PLOF: Independent  PATIENT GOALS:regain use of the arm  OBJECTIVE: (objective measures from initial evaluation unless otherwise dated)  OBSERVATION: using sling, waterproof dressing intact  PATIENT SURVEYS:  UEFS  Extreme difficulty/unable (0), Quite a bit of difficulty (1), Moderate difficulty (2), Little difficulty (3), No difficulty (4) Survey date:  03/30/24  Any of your usual work, household or school activities 0  2. Your usual hobbies, recreational/sport activities 1   3. Lifting a bag of groceries to waist level 2   4. Lifting a bag of groceries above your head 0  5. Grooming your hair 4  6. Pushing up on your hands (I.e. from bathtub or chair) 0  7. Preparing food (I.e. peeling/cutting) 1  8. Driving  3  9. Vacuuming, sweeping, or raking 4  10. Dressing  3  11. Doing up buttons 2  12. Using tools/appliances 1  13. Opening doors 0  14. Cleaning  3  15. Tying or lacing shoes 2  16. Sleeping  3  17. Laundering clothes (I.e. washing, ironing, folding) 3  18. Opening a jar 3  19. Throwing a ball 1  20. Carrying a small suitcase with your affected limb.  1  Score total:  35/80     COGNITION: Overall cognitive status: Within functional limits for tasks assessed     SENSATION: WFL  POSTURE: rounded shoulders and forward head   UPPER EXTREMITY ROM: AROM RUE WFL  Passive ROM Left eval  L 04/06/24 L 04/13/24 L 04/20/24 L 04/24/24  Shoulder flexion 100 PROM 110 AAROM 110* PROM, 120* AAROM supine  120-130* PROM (bit guarded today) 130* PROM and AROM  114 AROM 140 AAROM  Shoulder  extension       Shoulder abduction       Shoulder adduction       Shoulder internal rotation       Shoulder external rotation 20 25* next to body PROM setaed (very guarded), 30* AAROM seated  30* PROM arm next to body  30* PROM arm next to body    Elbow flexion       Elbow extension       Wrist flexion       Wrist extension       Wrist ulnar deviation       Wrist radial deviation       Wrist pronation       Wrist supination       (Blank rows = not tested) *=pain/symptoms  UPPER EXTREMITY MMT: NT due to post op status  MMT Right eval Left eval  Shoulder flexion    Shoulder extension    Shoulder abduction    Shoulder adduction    Shoulder internal rotation    Shoulder external rotation    Middle trapezius    Lower trapezius    Elbow flexion    Elbow extension    Wrist flexion    Wrist extension    Wrist ulnar deviation    Wrist radial deviation    Wrist pronation    Wrist supination    Grip strength (lbs)    (Blank rows = not tested) *=pain/symptoms    PALPATION:  LUE gross edema   TODAY'S TREATMENT:                                                                                                                                         DATE:  04/24/24 Pulley flexion 4 minutes Supine AAROM flexion with 1# bar 2 x 10 Sidelying AROM for ABD just to shoulder height 2x10  Sidelying ER 2 x 10 Prone shoulder extension 3 x 10 Wall ladder flexion 1 x 10 Shoulder flexion ball rolls at table 1 x 10  04/20/24  PROM/stretching as appropriate supine; backed off on ER stretching even within current 30* limit, this was a bit more tender today Supine AROM for flexion 2x10 0# Sidelying AROM for ABD just to shoulder height 2x10 0# Supine serratus punches 2# x10  Supine serratus punches 2# CW/CCW circles x10 each way  Supine serratus punch + alphabet x1 with 2#  Pulleys for L shoulder elevation 15x3 second holds Scap retractions 20x3 seconds min tactile cues for form  Backward  shoulder rolls x20 Min VC for good form       04/13/24  PROM/stretching as appropriate supine Supine AAROM x15 cane Supine AAROM scaption with  cane x15 Supine chest press cane x15  Standing isometrics: flexion 12x3 seconds, ABD 12x3 seconds, extension 12x3 seconds, ER 12x3 seconds door frame   Education on post-op recovery and long term pace of regaining ROM/strength/full function, typical post-op limitations and sensations (3-4 months to functional, about a year for max recovery/function)    04/06/24   Education/corrections given on HEP form, reassured him that getting ROM back especially for ER after shoulder surgery does take time, reviewed reverse TSA precautions  PROM as appropriate supine  Supine AAROM flexion x12 with cane  Supine chest presses with cane x12  Supine AAROM scaption x12 with cane, MinA from PT  Supine hand closing/opening with shoulder at 90* to help reduce hand edema  Seated scap retractions 20x3 seconds  Seated backward shoulder rolls x20  Seated ER AAROM seated sheet tucked to ribs cane x12     03/30/24 Eval, dressing change, HEP, education Wrist hand AROM Elbow AROM 1 x 10 Scap retraction 1 x 10 Seated AAROM ER 1 x 10 Supine AAROM well arm assisted flexion 1 x 10    PATIENT EDUCATION:  Education details: Patient educated on exam findings, POC, scope of PT, HEP, relevant anatomy/biomechanics, protocol/precautions. Person educated: Patient Education method: Explanation, Demonstration, and Handouts Education comprehension: verbalized understanding, returned demonstration, verbal cues required, and tactile cues required  HOME EXERCISE PROGRAM:  Access Code: [email or web address] URL: [email or web address] Date: 04/13/2024 Prepared by: Josette Rough  Exercises - Wrist AROM Flexion Extension  - 3 x daily - 7 x weekly - 2 sets - 10 reps - Seated Elbow Flexion and Extension AROM (Mirrored)  - 3 x daily - 7 x weekly - 2 sets - 10 reps -  Circular Shoulder Pendulum with Table Support (Mirrored)  - 3 x daily - 7 x weekly - 2 sets - 10 reps - Seated Scapular Retraction  - 3 x daily - 7 x weekly - 2 sets - 10 reps - Seated Shoulder Flexion Towel Slide at Table Top (Mirrored)  - 3 x daily - 7 x weekly - 10 reps - 10 second hold - Supine Shoulder Flexion AAROM with Hands Clasped  - 3 x daily - 7 x weekly - 3 sets - 10 reps - Supine Shoulder External Rotation with Dowel  - 3 x daily - 7 x weekly - 2 sets - 10 reps - Standing Isometric Shoulder Flexion with Doorway - Arm Bent  - 1 x daily - 7 x weekly - 2 sets - 10 reps - 3 seconds  hold - Standing Isometric Shoulder Abduction with Doorway - Arm Bent  - 1 x daily - 7 x weekly - 2 sets - 10 reps - 3 seconds  hold - Standing Isometric Shoulder Extension with Doorway - Arm Bent  - 1 x daily - 7 x weekly - 2 sets - 10 reps - 3 seconds  hold - Standing Isometric Shoulder External Rotation with Doorway  - 1 x daily - 7 x weekly - 2 sets - 10 reps - 3 seconds  hold   ASSESSMENT:  CLINICAL IMPRESSION:  Patient demonstrating improving L shoulder ROM to 140 AAROM today. Demonstrating improving ER strength tolerating ER against gravity today with good mechanics. Shoulder hike present with AROM due to weakness.  Patient will continue to benefit from physical therapy in order to improve function and reduce impairment.    EVAL: Patient a 83 y.o. y.o. male who was seen today for physical therapy evaluation and treatment for L reverse TSA  DOS 03/27/24. Patient presents with pain limited deficits in L shoulder strength, ROM, endurance, activity tolerance, and functional mobility with ADL. Patient is having to modify and restrict ADL as indicated by outcome measure score as well as subjective information and objective measures which is affecting overall participation. Patient will benefit from skilled physical therapy in order to improve function and reduce impairment.  OBJECTIVE IMPAIRMENTS: decreased  activity tolerance, decreased endurance, decreased mobility, decreased ROM, decreased strength, increased muscle spasms, impaired flexibility, impaired UE functional use, improper body mechanics, and pain  ACTIVITY LIMITATIONS:  carrying, lifting, bending, sleeping, bed mobility, bathing, dressing, reach over head, hygiene/grooming, and caring for others  PARTICIPATION LIMITATIONS:  meal prep, cleaning, laundry, driving, shopping, community activity, and yard work  PERSONAL FACTORS: Age, Time since onset of injury/illness/exacerbation, and 1-2 comorbidities: DM, HLD are also affecting patient's functional outcome.   REHAB POTENTIAL: Good  CLINICAL DECISION MAKING: Evolving/moderate complexity  EVALUATION COMPLEXITY: Moderate  GOALS: Goals reviewed with patient? Yes  SHORT TERM GOALS: Target date: 05/11/2024    Patient will be independent with HEP in order to improve functional outcomes. Baseline: Goal status: IN PROGRESS 04/06/24  2.  Patient will report at least 25% improvement in symptoms for improved quality of life. Baseline:  Goal status: IN PROGRESS 04/20/24  3.  Patient will demonstrate Elevation to 130 deg and ER to 30 deg - passive, active assisted or active  Baseline:  Goal status: MET 04/20/24  4.  Patient will be out of sling per surgeon approval for improved functional use of L shoulder. Baseline:  Goal status: MET 04/13/24   LONG TERM GOALS: Target date: 06/22/2024    Patient will report at least 75% improvement in symptoms for improved quality of life. Baseline:  Goal status: INITIAL  2.  Patient will improve UEFS score by at least 25 points in order to indicate improved tolerance to activity. Baseline:  Goal status: INITIAL  3.  Patient will demonstrate at least PROM/AAROM/AROM to: Elevation - 145-160; ER - 40-50 ; functional IR to L1 in L shoulder for improved ability lift overhead. Baseline:  Goal status: INITIAL  4.  Patient will be able to return to all  activities unrestricted for improved ability to perform yard work functions and participate with family.  Baseline:  Goal status: INITIAL  5.  Patient will demonstrate grade of 5/5 MMT grade in all tested musculature as evidence of improved strength to assist with lifting at home. Baseline:  Goal status: INITIAL   PLAN:  PT FREQUENCY: 1-2x/week  PT DURATION: 12 weeks  PLANNED INTERVENTIONS:[id number]- PT Re-evaluation, 97110-Therapeutic exercises, 97530- Therapeutic activity, [id number]- Neuromuscular re-education, 97535- Self Care, [id number]- Manual therapy, [phone]- Gait training, [phone]- Orthotic Fit/training, [phone]- Canalith repositioning, [id number]- Aquatic Therapy, [phone]- Splinting, [phone]- Wound care (first 20 sq cm), 97598- Wound care (each additional 20 sq cm)Patient/Family education, Balance training, Stair training, Taping, Dry Needling, Joint mobilization, Joint manipulation, Spinal manipulation, Spinal mobilization, Scar mobilization, and DME instructions.  PLAN FOR NEXT SESSION: progress with reverse TSA protocol,  3 weeks out as of  04/17/24. OK to do active ROM and strength per MD note 04/09/24    Prentice RAMAN Elton Catalano, PT, DPT 04/24/2024, 9:28 AM

## 2024-04-27 ENCOUNTER — Encounter (HOSPITAL_BASED_OUTPATIENT_CLINIC_OR_DEPARTMENT_OTHER): Payer: Self-pay | Admitting: Physical Therapy

## 2024-04-27 ENCOUNTER — Ambulatory Visit (HOSPITAL_BASED_OUTPATIENT_CLINIC_OR_DEPARTMENT_OTHER): Admitting: Physical Therapy

## 2024-04-27 DIAGNOSIS — M25512 Pain in left shoulder: Secondary | ICD-10-CM | POA: Diagnosis not present

## 2024-04-27 DIAGNOSIS — R29898 Other symptoms and signs involving the musculoskeletal system: Secondary | ICD-10-CM

## 2024-04-27 DIAGNOSIS — M6281 Muscle weakness (generalized): Secondary | ICD-10-CM | POA: Diagnosis not present

## 2024-04-27 DIAGNOSIS — M25612 Stiffness of left shoulder, not elsewhere classified: Secondary | ICD-10-CM | POA: Diagnosis not present

## 2024-04-27 DIAGNOSIS — M7542 Impingement syndrome of left shoulder: Secondary | ICD-10-CM | POA: Diagnosis not present

## 2024-04-27 NOTE — Therapy (Signed)
 OUTPATIENT PHYSICAL THERAPY SHOULDER TREATMENT    Patient Name: Greg Rogers. MRN: [id number] DOB:06-22-1941, 83 y.o., male Today's Date: 04/27/2024  END OF SESSION:  PT End of Session - 04/27/24 0843     Visit Number 6    Number of Visits 24    Date for PT Re-Evaluation 06/22/24    Authorization Type MCR    Progress Note Due on Visit 10    PT Start Time 0845    PT Stop Time 0928    PT Time Calculation (min) 43 min    Activity Tolerance Patient tolerated treatment well    Behavior During Therapy WFL for tasks assessed/performed             Past Medical History:  Diagnosis Date   Abnormal GGT test    With otherwise normal work up.   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)    Persistent disorder of initiating or maintaining sleep    Pes planus    Rosacea    Snoring    Past Surgical History:  Procedure Laterality Date   APPENDECTOMY  1949   at age 28   CERVICAL FUSION  03/2016   C 3&4   Cyst removed from breast  1960   FOOT SURGERY  01/2009   Left   HERNIA REPAIR  1956   at age 86 (single)   HERNIA REPAIR  1960   (double)   Left 2nd toe removed  03/2017   REVERSE SHOULDER ARTHROPLASTY Left 03/27/2024   Procedure: ARTHROPLASTY, SHOULDER, TOTAL, REVERSE;  Surgeon: Genelle Standing, MD;  Location: [facility];  Service: Orthopedics;  Laterality: Left;   SPINE SURGERY     TONSILLECTOMY AND ADENOIDECTOMY  ~ 1950   VASECTOMY  ~ 1984   Patient Active Problem List   Diagnosis Date Noted   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 03/27/2024   Leg pain 03/21/2024   Shoulder pain 12/07/2023   Change in stool 11/02/2023   Nasal lesion 06/02/2023   Occipital neuralgia 10/12/2020   Wound of right leg, initial encounter  07/03/2019   Healthcare maintenance 03/04/2017   Foot pain 07/22/2016   Cervical stenosis of spine 01/06/2016   thyroid  nodule-benign path on bx 01/2016 01/06/2016   Radicular pain in right arm 12/22/2015   Radicular leg pain 12/22/2015   Back pain 07/06/2015   Constipation 07/06/2015   FH: CAD (coronary artery disease) 05/23/2015   Family history of ischemic heart disease and other diseases of the circulatory system 05/23/2015   Advance care planning 11/26/2014   Pain in joint, shoulder region 11/26/2014   Medicare annual wellness visit, subsequent 09/27/2012   GERD (gastroesophageal reflux disease) 07/05/2012   Irritability 03/29/2011   Advance directive on file 01/09/2011   Pain in joint, other site 01/04/2011   Arthropathy 09/02/2010   NEPHROLITHIASIS, HX OF 09/02/2010   Diabetes mellitus without complication (HCC) 09/01/2010   HLD (hyperlipidemia) 10/24/2009   SNORING 10/24/2009   FOOT PAIN, BILATERAL 10/06/2009   Pes planus 10/06/2009   Acquired flat foot 10/06/2009    PCP: Cleatus Arlyss RAMAN, MD  REFERRING PROVIDER: Genelle Standing, MD  REFERRING DIAG: S46.012A (ICD-10-CM) - Traumatic complete tear of  left rotator cuff, initial encounter;  1. Left shoulder reverse shoulder arthroplasty 2. Left shoulder biceps tenodesis  THERAPY DIAG:  Acute pain of left shoulder  Muscle weakness (generalized)  Stiffness of left shoulder, not elsewhere classified  Other symptoms and signs involving the musculoskeletal system  Rationale for Evaluation and Treatment: Rehabilitation  ONSET DATE: DOS 03/27/24  SUBJECTIVE:                                                                                                                                                                                      SUBJECTIVE STATEMENT: Patient states shoulder feeling good, no issues. Somewhat of a kink in the shoulder the last day or so.    EVAL: Patient states shoulder has been doing good.  Using sling. Was very active prior to injury/surgery with gardening/yard work Hand dominance: Right  PERTINENT HISTORY: DM, HLD, chronic shoulder pain with fall in March while exiting ski lift with RC tear  PAIN:  Are you having pain? No 0/10  today, 0/10 over past week as well   PRECAUTIONS: Other: L reverse TSA   RED FLAGS: None   WEIGHT BEARING RESTRICTIONS: NWB LUE  FALLS:  Has patient fallen in last 6 months? Yes. Number of falls 1  PLOF: Independent  PATIENT GOALS:regain use of the arm  OBJECTIVE: (objective measures from initial evaluation unless otherwise dated)  OBSERVATION: using sling, waterproof dressing intact  PATIENT SURVEYS:  UEFS  Extreme difficulty/unable (0), Quite a bit of difficulty (1), Moderate difficulty (2), Little difficulty (3), No difficulty (4) Survey date:  03/30/24  Any of your usual work, household or school activities 0  2. Your usual hobbies, recreational/sport activities 1   3. Lifting a bag of groceries to waist level 2   4. Lifting a bag of groceries above your head 0  5. Grooming your hair 4  6. Pushing up on your hands (I.e. from bathtub or chair) 0  7. Preparing food (I.e. peeling/cutting) 1  8. Driving  3  9. Vacuuming, sweeping, or raking 4  10. Dressing  3  11. Doing up buttons 2  12. Using tools/appliances 1  13. Opening doors 0  14. Cleaning  3  15. Tying or lacing shoes 2  16. Sleeping  3  17. Laundering clothes (I.e. washing, ironing, folding) 3  18. Opening a jar 3  19. Throwing a ball 1  20. Carrying a small suitcase with your affected limb.  1  Score total:  35/80     COGNITION: Overall cognitive status: Within functional limits for tasks assessed     SENSATION: WFL  POSTURE: rounded shoulders and forward head  UPPER EXTREMITY ROM: AROM RUE WFL  Passive ROM Left eval L 04/06/24 L 04/13/24 L 04/20/24 L 04/24/24 L 04/27/24  Shoulder flexion 100 PROM 110 AAROM 110* PROM, 120* AAROM supine  120-130* PROM (bit  guarded today) 130* PROM and AROM  114 AROM 140 AAROM 145 AAROM  Shoulder extension        Shoulder abduction        Shoulder adduction        Shoulder internal rotation        Shoulder external rotation 20 25* next to body PROM setaed (very guarded), 30* AAROM seated  30* PROM arm next to body  30* PROM arm next to body     Elbow flexion        Elbow extension        Wrist flexion        Wrist extension        Wrist ulnar deviation        Wrist radial deviation        Wrist pronation        Wrist supination        (Blank rows = not tested) *=pain/symptoms  UPPER EXTREMITY MMT: NT due to post op status  MMT Right eval Left eval  Shoulder flexion    Shoulder extension    Shoulder abduction    Shoulder adduction    Shoulder internal rotation    Shoulder external rotation    Middle trapezius    Lower trapezius    Elbow flexion    Elbow extension    Wrist flexion    Wrist extension    Wrist ulnar deviation    Wrist radial deviation    Wrist pronation    Wrist supination    Grip strength (lbs)    (Blank rows = not tested) *=pain/symptoms    PALPATION:  LUE gross edema   TODAY'S TREATMENT:                                                                                                                                         DATE:  04/27/24 Pulley flexion 4 minutes Manual: Grade II-III inferior glides in flexion, STM deltoid, tricep Supine AAROM flexion with 1# bar 2 x 10 flat table, 30 degree incline 1# 1 x 10 Prone shoulder extension 1# 3 x 10 Prone row 1# 3 x 10 Wall ladder flexion 1 x 10  04/24/24 Pulley flexion 4 minutes Supine AAROM flexion with 1# bar 2 x 10 Sidelying AROM for ABD just to shoulder height 2x10  Sidelying ER 2 x 10 Prone shoulder extension 3 x 10 Wall ladder flexion 1 x 10 Shoulder flexion ball rolls at table 1 x 10  04/20/24  PROM/stretching as appropriate supine; backed off on ER stretching even within current 30* limit, this was a bit  more tender today Supine AROM for flexion 2x10 0# Sidelying  AROM for ABD just to shoulder height 2x10 0# Supine serratus punches 2# x10  Supine serratus punches 2# CW/CCW circles x10 each way  Supine serratus punch + alphabet x1 with 2#  Pulleys for L shoulder elevation 15x3 second holds Scap retractions 20x3 seconds min tactile cues for form  Backward shoulder rolls x20 Min VC for good form       04/13/24  PROM/stretching as appropriate supine Supine AAROM x15 cane Supine AAROM scaption with cane x15 Supine chest press cane x15  Standing isometrics: flexion 12x3 seconds, ABD 12x3 seconds, extension 12x3 seconds, ER 12x3 seconds door frame   Education on post-op recovery and long term pace of regaining ROM/strength/full function, typical post-op limitations and sensations (3-4 months to functional, about a year for max recovery/function)    04/06/24   Education/corrections given on HEP form, reassured him that getting ROM back especially for ER after shoulder surgery does take time, reviewed reverse TSA precautions  PROM as appropriate supine  Supine AAROM flexion x12 with cane  Supine chest presses with cane x12  Supine AAROM scaption x12 with cane, MinA from PT  Supine hand closing/opening with shoulder at 90* to help reduce hand edema  Seated scap retractions 20x3 seconds  Seated backward shoulder rolls x20  Seated ER AAROM seated sheet tucked to ribs cane x12     03/30/24 Eval, dressing change, HEP, education Wrist hand AROM Elbow AROM 1 x 10 Scap retraction 1 x 10 Seated AAROM ER 1 x 10 Supine AAROM well arm assisted flexion 1 x 10    PATIENT EDUCATION:  Education details: Patient educated on exam findings, POC, scope of PT, HEP, relevant anatomy/biomechanics, protocol/precautions. Person educated: Patient Education method: Explanation, Demonstration, and Handouts Education comprehension: verbalized understanding, returned demonstration, verbal cues  required, and tactile cues required  HOME EXERCISE PROGRAM:  Access Code: [email or web address] URL: [email or web address] Date: 04/13/2024 Prepared by: Josette Rough  Exercises - Wrist AROM Flexion Extension  - 3 x daily - 7 x weekly - 2 sets - 10 reps - Seated Elbow Flexion and Extension AROM (Mirrored)  - 3 x daily - 7 x weekly - 2 sets - 10 reps - Circular Shoulder Pendulum with Table Support (Mirrored)  - 3 x daily - 7 x weekly - 2 sets - 10 reps - Seated Scapular Retraction  - 3 x daily - 7 x weekly - 2 sets - 10 reps - Seated Shoulder Flexion Towel Slide at Table Top (Mirrored)  - 3 x daily - 7 x weekly - 10 reps - 10 second hold - Supine Shoulder Flexion AAROM with Hands Clasped  - 3 x daily - 7 x weekly - 3 sets - 10 reps - Supine Shoulder External Rotation with Dowel  - 3 x daily - 7 x weekly - 2 sets - 10 reps - Standing Isometric Shoulder Flexion with Doorway - Arm Bent  - 1 x daily - 7 x weekly - 2 sets - 10 reps - 3 seconds  hold - Standing Isometric Shoulder Abduction with Doorway - Arm Bent  - 1 x daily - 7 x weekly - 2 sets - 10 reps - 3 seconds  hold - Standing Isometric Shoulder Extension with Doorway - Arm Bent  - 1 x daily - 7 x weekly - 2 sets - 10 reps - 3 seconds  hold - Standing Isometric Shoulder External Rotation with Doorway  - 1 x daily - 7 x weekly - 2 sets - 10 reps - 3  seconds  hold   ASSESSMENT:  CLINICAL IMPRESSION:  Patient with c/o symptoms at posterior/lateral deltoid and tricep, hyperactive and tenderness noted with palpation. Some decrease in tissue tensionmwith manual. AAROM to 145 folowing manual and exercise.  Patient will continue to benefit from physical therapy in order to improve function and reduce impairment.    EVAL: Patient a 83 y.o. y.o. male who was seen today for physical therapy evaluation and treatment for L reverse TSA DOS 03/27/24. Patient presents with pain limited deficits in L shoulder strength, ROM, endurance, activity  tolerance, and functional mobility with ADL. Patient is having to modify and restrict ADL as indicated by outcome measure score as well as subjective information and objective measures which is affecting overall participation. Patient will benefit from skilled physical therapy in order to improve function and reduce impairment.  OBJECTIVE IMPAIRMENTS: decreased activity tolerance, decreased endurance, decreased mobility, decreased ROM, decreased strength, increased muscle spasms, impaired flexibility, impaired UE functional use, improper body mechanics, and pain  ACTIVITY LIMITATIONS:  carrying, lifting, bending, sleeping, bed mobility, bathing, dressing, reach over head, hygiene/grooming, and caring for others  PARTICIPATION LIMITATIONS:  meal prep, cleaning, laundry, driving, shopping, community activity, and yard work  PERSONAL FACTORS: Age, Time since onset of injury/illness/exacerbation, and 1-2 comorbidities: DM, HLD are also affecting patient's functional outcome.   REHAB POTENTIAL: Good  CLINICAL DECISION MAKING: Evolving/moderate complexity  EVALUATION COMPLEXITY: Moderate  GOALS: Goals reviewed with patient? Yes  SHORT TERM GOALS: Target date: 05/11/2024    Patient will be independent with HEP in order to improve functional outcomes. Baseline: Goal status: IN PROGRESS 04/06/24  2.  Patient will report at least 25% improvement in symptoms for improved quality of life. Baseline:  Goal status: IN PROGRESS 04/20/24  3.  Patient will demonstrate Elevation to 130 deg and ER to 30 deg - passive, active assisted or active  Baseline:  Goal status: MET 04/20/24  4.  Patient will be out of sling per surgeon approval for improved functional use of L shoulder. Baseline:  Goal status: MET 04/13/24   LONG TERM GOALS: Target date: 06/22/2024    Patient will report at least 75% improvement in symptoms for improved quality of life. Baseline:  Goal status: INITIAL  2.  Patient will  improve UEFS score by at least 25 points in order to indicate improved tolerance to activity. Baseline:  Goal status: INITIAL  3.  Patient will demonstrate at least PROM/AAROM/AROM to: Elevation - 145-160; ER - 40-50 ; functional IR to L1 in L shoulder for improved ability lift overhead. Baseline:  Goal status: INITIAL  4.  Patient will be able to return to all activities unrestricted for improved ability to perform yard work functions and participate with family.  Baseline:  Goal status: INITIAL  5.  Patient will demonstrate grade of 5/5 MMT grade in all tested musculature as evidence of improved strength to assist with lifting at home. Baseline:  Goal status: INITIAL   PLAN:  PT FREQUENCY: 1-2x/week  PT DURATION: 12 weeks  PLANNED INTERVENTIONS:[id number]- PT Re-evaluation, 97110-Therapeutic exercises, 97530- Therapeutic activity, [id number]- Neuromuscular re-education, 97535- Self Care, [id number]- Manual therapy, [phone]- Gait training, [phone]- Orthotic Fit/training, [phone]- Canalith repositioning, [id number]- Aquatic Therapy, [phone]- Splinting, [phone]- Wound care (first 20 sq cm), 97598- Wound care (each additional 20 sq cm)Patient/Family education, Balance training, Stair training, Taping, Dry Needling, Joint mobilization, Joint manipulation, Spinal manipulation, Spinal mobilization, Scar mobilization, and DME instructions.  PLAN FOR NEXT SESSION: progress with reverse TSA protocol,  3 weeks  out as of  04/17/24. OK to do active ROM and strength per MD note 04/09/24    Prentice RAMAN Filippa Yarbough, PT, DPT 04/27/2024, 9:28 AM

## 2024-05-02 ENCOUNTER — Ambulatory Visit (HOSPITAL_BASED_OUTPATIENT_CLINIC_OR_DEPARTMENT_OTHER): Admitting: Physical Therapy

## 2024-05-02 ENCOUNTER — Encounter (HOSPITAL_BASED_OUTPATIENT_CLINIC_OR_DEPARTMENT_OTHER): Payer: Self-pay | Admitting: Physical Therapy

## 2024-05-02 DIAGNOSIS — M6281 Muscle weakness (generalized): Secondary | ICD-10-CM

## 2024-05-02 DIAGNOSIS — M25612 Stiffness of left shoulder, not elsewhere classified: Secondary | ICD-10-CM

## 2024-05-02 DIAGNOSIS — R29898 Other symptoms and signs involving the musculoskeletal system: Secondary | ICD-10-CM

## 2024-05-02 DIAGNOSIS — M7542 Impingement syndrome of left shoulder: Secondary | ICD-10-CM | POA: Diagnosis not present

## 2024-05-02 DIAGNOSIS — M25512 Pain in left shoulder: Secondary | ICD-10-CM | POA: Diagnosis not present

## 2024-05-02 NOTE — Therapy (Signed)
 OUTPATIENT PHYSICAL THERAPY SHOULDER TREATMENT    Patient Name: Greg Rogers. MRN: [id number] DOB:12-21-1940, 83 y.o., male Today's Date: 05/02/2024  END OF SESSION:  PT End of Session - 05/02/24 0842     Visit Number 7    Number of Visits 24    Date for PT Re-Evaluation 06/22/24    Authorization Type MCR    Progress Note Due on Visit 10    PT Start Time 0845    PT Stop Time 0925    PT Time Calculation (min) 40 min    Activity Tolerance Patient tolerated treatment well    Behavior During Therapy WFL for tasks assessed/performed             Past Medical History:  Diagnosis Date   Abnormal GGT test    With otherwise normal work up.   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)    Persistent disorder of initiating or maintaining sleep    Pes planus    Rosacea    Snoring    Past Surgical History:  Procedure Laterality Date   APPENDECTOMY  1949   at age 85   CERVICAL FUSION  03/2016   C 3&4   Cyst removed from breast  1960   FOOT SURGERY  01/2009   Left   HERNIA REPAIR  1956   at age 9 (single)   HERNIA REPAIR  1960   (double)   Left 2nd toe removed  03/2017   REVERSE SHOULDER ARTHROPLASTY Left 03/27/2024   Procedure: ARTHROPLASTY, SHOULDER, TOTAL, REVERSE;  Surgeon: Genelle Standing, MD;  Location: [facility];  Service: Orthopedics;  Laterality: Left;   SPINE SURGERY     TONSILLECTOMY AND ADENOIDECTOMY  ~ 1950   VASECTOMY  ~ 1984   Patient Active Problem List   Diagnosis Date Noted   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 03/27/2024   Leg pain 03/21/2024   Shoulder pain 12/07/2023   Change in stool 11/02/2023   Nasal lesion 06/02/2023   Occipital neuralgia 10/12/2020   Wound of right leg, initial encounter  07/03/2019   Healthcare maintenance 03/04/2017   Foot pain 07/22/2016   Cervical stenosis of spine 01/06/2016   thyroid  nodule-benign path on bx 01/2016 01/06/2016   Radicular pain in right arm 12/22/2015   Radicular leg pain 12/22/2015   Back pain 07/06/2015   Constipation 07/06/2015   FH: CAD (coronary artery disease) 05/23/2015   Family history of ischemic heart disease and other diseases of the circulatory system 05/23/2015   Advance care planning 11/26/2014   Pain in joint, shoulder region 11/26/2014   Medicare annual wellness visit, subsequent 09/27/2012   GERD (gastroesophageal reflux disease) 07/05/2012   Irritability 03/29/2011   Advance directive on file 01/09/2011   Pain in joint, other site 01/04/2011   Arthropathy 09/02/2010   NEPHROLITHIASIS, HX OF 09/02/2010   Diabetes mellitus without complication (HCC) 09/01/2010   HLD (hyperlipidemia) 10/24/2009   SNORING 10/24/2009   FOOT PAIN, BILATERAL 10/06/2009   Pes planus 10/06/2009   Acquired flat foot 10/06/2009    PCP: Cleatus Arlyss RAMAN, MD  REFERRING PROVIDER: Genelle Standing, MD  REFERRING DIAG: S46.012A (ICD-10-CM) - Traumatic complete tear of  left rotator cuff, initial encounter;  1. Left shoulder reverse shoulder arthroplasty 2. Left shoulder biceps tenodesis  THERAPY DIAG:  Acute pain of left shoulder  Muscle weakness (generalized)  Stiffness of left shoulder, not elsewhere classified  Other symptoms and signs involving the musculoskeletal system  Rationale for Evaluation and Treatment: Rehabilitation  ONSET DATE: DOS 03/27/24  SUBJECTIVE:                                                                                                                                                                                      SUBJECTIVE STATEMENT: Patient states made a pulley at home. Using a pipe for a little weight for HEP.    EVAL: Patient states shoulder has been doing good. Using sling. Was very active  prior to injury/surgery with gardening/yard work Hand dominance: Right  PERTINENT HISTORY: DM, HLD, chronic shoulder pain with fall in March while exiting ski lift with RC tear  PAIN:  Are you having pain? No 0/10  today, 0/10 over past week as well   PRECAUTIONS: Other: L reverse TSA   RED FLAGS: None   WEIGHT BEARING RESTRICTIONS: NWB LUE  FALLS:  Has patient fallen in last 6 months? Yes. Number of falls 1  PLOF: Independent  PATIENT GOALS:regain use of the arm  OBJECTIVE: (objective measures from initial evaluation unless otherwise dated)  OBSERVATION: using sling, waterproof dressing intact  PATIENT SURVEYS:  UEFS  Extreme difficulty/unable (0), Quite a bit of difficulty (1), Moderate difficulty (2), Little difficulty (3), No difficulty (4) Survey date:  03/30/24  Any of your usual work, household or school activities 0  2. Your usual hobbies, recreational/sport activities 1   3. Lifting a bag of groceries to waist level 2   4. Lifting a bag of groceries above your head 0  5. Grooming your hair 4  6. Pushing up on your hands (I.e. from bathtub or chair) 0  7. Preparing food (I.e. peeling/cutting) 1  8. Driving  3  9. Vacuuming, sweeping, or raking 4  10. Dressing  3  11. Doing up buttons 2  12. Using tools/appliances 1  13. Opening doors 0  14. Cleaning  3  15. Tying or lacing shoes 2  16. Sleeping  3  17. Laundering clothes (I.e. washing, ironing, folding) 3  18. Opening a jar 3  19. Throwing a ball 1  20. Carrying a small suitcase with your affected limb.  1  Score total:  35/80     COGNITION: Overall cognitive status: Within functional limits for tasks assessed     SENSATION: WFL  POSTURE: rounded shoulders and forward head   UPPER EXTREMITY ROM:  AROM RUE WFL  Passive ROM Left eval L 04/06/24 L 04/13/24 L 04/20/24 L 04/24/24 L 04/27/24 L 9/17  Shoulder flexion 100 PROM 110 AAROM 110* PROM, 120* AAROM supine  120-130* PROM (bit guarded today) 130*  PROM and AROM  114 AROM 140 AAROM 145 AAROM 138 AAROM Improves to 149 AAROM  Shoulder extension         Shoulder abduction         Shoulder adduction         Shoulder internal rotation         Shoulder external rotation 20 25* next to body PROM setaed (very guarded), 30* AAROM seated  30* PROM arm next to body  30* PROM arm next to body      Elbow flexion         Elbow extension         Wrist flexion         Wrist extension         Wrist ulnar deviation         Wrist radial deviation         Wrist pronation         Wrist supination         (Blank rows = not tested) *=pain/symptoms  UPPER EXTREMITY MMT: NT due to post op status  MMT Right eval Left eval  Shoulder flexion    Shoulder extension    Shoulder abduction    Shoulder adduction    Shoulder internal rotation    Shoulder external rotation    Middle trapezius    Lower trapezius    Elbow flexion    Elbow extension    Wrist flexion    Wrist extension    Wrist ulnar deviation    Wrist radial deviation    Wrist pronation    Wrist supination    Grip strength (lbs)    (Blank rows = not tested) *=pain/symptoms    PALPATION:  LUE gross edema   TODAY'S TREATMENT:                                                                                                                                         DATE:  05/02/24 Pulley flexion 4 minutes Supine AAROM flexion with 2# bar 2 x 10 flat table Manual: Grade II-III inferior glides in flexion, STM deltoid, tricep Perturbations in flexion 3 x 20 Supine SA punch 3# 2 x 15 Sidelying ER 1 x 12, 1# 1 x 12 Standing  functional IR strech with cane 10 x 10 second holds gentle Standing row GTB 2x 10 Wall ladder flexion 1 x 10  04/27/24 Pulley flexion 4 minutes Manual: Grade II-III inferior glides in flexion, STM deltoid, tricep Supine AAROM flexion with 1# bar 2 x 10 flat table, 30 degree incline 1# 1 x 10 Prone shoulder extension 1# 3 x 10 Prone row 1# 3  x 10 Wall ladder  flexion 1 x 10  04/24/24 Pulley flexion 4 minutes Supine AAROM flexion with 1# bar 2 x 10 Sidelying AROM for ABD just to shoulder height 2x10  Sidelying ER 2 x 10 Prone shoulder extension 3 x 10 Wall ladder flexion 1 x 10 Shoulder flexion ball rolls at table 1 x 10  04/20/24  PROM/stretching as appropriate supine; backed off on ER stretching even within current 30* limit, this was a bit more tender today Supine AROM for flexion 2x10 0# Sidelying AROM for ABD just to shoulder height 2x10 0# Supine serratus punches 2# x10  Supine serratus punches 2# CW/CCW circles x10 each way  Supine serratus punch + alphabet x1 with 2#  Pulleys for L shoulder elevation 15x3 second holds Scap retractions 20x3 seconds min tactile cues for form  Backward shoulder rolls x20 Min VC for good form       04/13/24  PROM/stretching as appropriate supine Supine AAROM x15 cane Supine AAROM scaption with cane x15 Supine chest press cane x15  Standing isometrics: flexion 12x3 seconds, ABD 12x3 seconds, extension 12x3 seconds, ER 12x3 seconds door frame   Education on post-op recovery and long term pace of regaining ROM/strength/full function, typical post-op limitations and sensations (3-4 months to functional, about a year for max recovery/function)    04/06/24   Education/corrections given on HEP form, reassured him that getting ROM back especially for ER after shoulder surgery does take time, reviewed reverse TSA precautions  PROM as appropriate supine  Supine AAROM flexion x12 with cane  Supine chest presses with cane x12  Supine AAROM scaption x12 with cane, MinA from PT  Supine hand closing/opening with shoulder at 90* to help reduce hand edema  Seated scap retractions 20x3 seconds  Seated backward shoulder rolls x20  Seated ER AAROM seated sheet tucked to ribs cane x12     03/30/24 Eval, dressing change, HEP, education Wrist hand AROM Elbow AROM 1 x 10 Scap retraction 1 x 10 Seated  AAROM ER 1 x 10 Supine AAROM well arm assisted flexion 1 x 10    PATIENT EDUCATION:  Education details: Patient educated on exam findings, POC, scope of PT, HEP, relevant anatomy/biomechanics, protocol/precautions. Person educated: Patient Education method: Explanation, Demonstration, and Handouts Education comprehension: verbalized understanding, returned demonstration, verbal cues required, and tactile cues required  HOME EXERCISE PROGRAM:  Access Code: [email or web address] URL: [email or web address] Date: 04/13/2024 Prepared by: Josette Rough  Exercises - Wrist AROM Flexion Extension  - 3 x daily - 7 x weekly - 2 sets - 10 reps - Seated Elbow Flexion and Extension AROM (Mirrored)  - 3 x daily - 7 x weekly - 2 sets - 10 reps - Circular Shoulder Pendulum with Table Support (Mirrored)  - 3 x daily - 7 x weekly - 2 sets - 10 reps - Seated Scapular Retraction  - 3 x daily - 7 x weekly - 2 sets - 10 reps - Seated Shoulder Flexion Towel Slide at Table Top (Mirrored)  - 3 x daily - 7 x weekly - 10 reps - 10 second hold - Supine Shoulder Flexion AAROM with Hands Clasped  - 3 x daily - 7 x weekly - 3 sets - 10 reps - Supine Shoulder External Rotation with Dowel  - 3 x daily - 7 x weekly - 2 sets - 10 reps - Standing Isometric Shoulder Flexion with Doorway - Arm Bent  - 1 x daily - 7 x weekly - 2 sets - 10  reps - 3 seconds  hold - Standing Isometric Shoulder Abduction with Doorway - Arm Bent  - 1 x daily - 7 x weekly - 2 sets - 10 reps - 3 seconds  hold - Standing Isometric Shoulder Extension with Doorway - Arm Bent  - 1 x daily - 7 x weekly - 2 sets - 10 reps - 3 seconds  hold - Standing Isometric Shoulder External Rotation with Doorway  - 1 x daily - 7 x weekly - 2 sets - 10 reps - 3 seconds  hold   ASSESSMENT:  CLINICAL IMPRESSION:  Patient with improving ROM to 149 AAROM following manual, improvement in symptoms following manual as well.  Continued with shoulder strengthening with  intermittent cueing for mechanics. Patient will continue to benefit from physical therapy in order to improve function and reduce impairment.    EVAL: Patient a 83 y.o. y.o. male who was seen today for physical therapy evaluation and treatment for L reverse TSA DOS 03/27/24. Patient presents with pain limited deficits in L shoulder strength, ROM, endurance, activity tolerance, and functional mobility with ADL. Patient is having to modify and restrict ADL as indicated by outcome measure score as well as subjective information and objective measures which is affecting overall participation. Patient will benefit from skilled physical therapy in order to improve function and reduce impairment.  OBJECTIVE IMPAIRMENTS: decreased activity tolerance, decreased endurance, decreased mobility, decreased ROM, decreased strength, increased muscle spasms, impaired flexibility, impaired UE functional use, improper body mechanics, and pain  ACTIVITY LIMITATIONS:  carrying, lifting, bending, sleeping, bed mobility, bathing, dressing, reach over head, hygiene/grooming, and caring for others  PARTICIPATION LIMITATIONS:  meal prep, cleaning, laundry, driving, shopping, community activity, and yard work  PERSONAL FACTORS: Age, Time since onset of injury/illness/exacerbation, and 1-2 comorbidities: DM, HLD are also affecting patient's functional outcome.   REHAB POTENTIAL: Good  CLINICAL DECISION MAKING: Evolving/moderate complexity  EVALUATION COMPLEXITY: Moderate  GOALS: Goals reviewed with patient? Yes  SHORT TERM GOALS: Target date: 05/11/2024    Patient will be independent with HEP in order to improve functional outcomes. Baseline: Goal status: IN PROGRESS 04/06/24  2.  Patient will report at least 25% improvement in symptoms for improved quality of life. Baseline:  Goal status: IN PROGRESS 04/20/24  3.  Patient will demonstrate Elevation to 130 deg and ER to 30 deg - passive, active assisted or active   Baseline:  Goal status: MET 04/20/24  4.  Patient will be out of sling per surgeon approval for improved functional use of L shoulder. Baseline:  Goal status: MET 04/13/24   LONG TERM GOALS: Target date: 06/22/2024    Patient will report at least 75% improvement in symptoms for improved quality of life. Baseline:  Goal status: INITIAL  2.  Patient will improve UEFS score by at least 25 points in order to indicate improved tolerance to activity. Baseline:  Goal status: INITIAL  3.  Patient will demonstrate at least PROM/AAROM/AROM to: Elevation - 145-160; ER - 40-50 ; functional IR to L1 in L shoulder for improved ability lift overhead. Baseline:  Goal status: INITIAL  4.  Patient will be able to return to all activities unrestricted for improved ability to perform yard work functions and participate with family.  Baseline:  Goal status: INITIAL  5.  Patient will demonstrate grade of 5/5 MMT grade in all tested musculature as evidence of improved strength to assist with lifting at home. Baseline:  Goal status: INITIAL   PLAN:  PT FREQUENCY:  1-2x/week  PT DURATION: 12 weeks  PLANNED INTERVENTIONS:[id number]- PT Re-evaluation, 97110-Therapeutic exercises, 97530- Therapeutic activity, [id number]- Neuromuscular re-education, 97535- Self Care, [id number]- Manual therapy, [phone]- Gait training, [phone]- Orthotic Fit/training, [phone]- Canalith repositioning, [id number]- Aquatic Therapy, [phone]- Splinting, [phone]- Wound care (first 20 sq cm), 97598- Wound care (each additional 20 sq cm)Patient/Family education, Balance training, Stair training, Taping, Dry Needling, Joint mobilization, Joint manipulation, Spinal manipulation, Spinal mobilization, Scar mobilization, and DME instructions.  PLAN FOR NEXT SESSION: progress with reverse TSA protocol,  3 weeks out as of  04/17/24. OK to do active ROM and strength per MD note 04/09/24    Prentice RAMAN Nuha Degner, PT, DPT 05/02/2024, 9:27 AM

## 2024-05-04 ENCOUNTER — Ambulatory Visit (HOSPITAL_BASED_OUTPATIENT_CLINIC_OR_DEPARTMENT_OTHER): Admitting: Physical Therapy

## 2024-05-04 ENCOUNTER — Encounter (HOSPITAL_BASED_OUTPATIENT_CLINIC_OR_DEPARTMENT_OTHER): Payer: Self-pay | Admitting: Physical Therapy

## 2024-05-04 DIAGNOSIS — M6281 Muscle weakness (generalized): Secondary | ICD-10-CM | POA: Diagnosis not present

## 2024-05-04 DIAGNOSIS — M25612 Stiffness of left shoulder, not elsewhere classified: Secondary | ICD-10-CM | POA: Diagnosis not present

## 2024-05-04 DIAGNOSIS — M7542 Impingement syndrome of left shoulder: Secondary | ICD-10-CM | POA: Diagnosis not present

## 2024-05-04 DIAGNOSIS — M25512 Pain in left shoulder: Secondary | ICD-10-CM

## 2024-05-04 DIAGNOSIS — R29898 Other symptoms and signs involving the musculoskeletal system: Secondary | ICD-10-CM | POA: Diagnosis not present

## 2024-05-04 NOTE — Therapy (Addendum)
 OUTPATIENT PHYSICAL THERAPY SHOULDER TREATMENT    Patient Name: Greg Rogers. MRN: [id number] DOB:[birth year], 83 y.o., male Today's Date: 05/04/2024  END OF SESSION:  PT End of Session - 05/04/24 0839     Visit Number 8    Number of Visits 24    Date for Recertification  06/22/24    Authorization Type MCR    Progress Note Due on Visit 10    PT Start Time 0845    PT Stop Time 0926    PT Time Calculation (min) 41 min    Activity Tolerance Patient tolerated treatment well    Behavior During Therapy WFL for tasks assessed/performed           Past Medical History:  Diagnosis Date   Abnormal GGT test    With otherwise normal work up.   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)    Persistent disorder of initiating or maintaining sleep    Pes planus    Rosacea    Snoring    Past Surgical History:  Procedure Laterality Date   APPENDECTOMY  1949   at age 72   CERVICAL FUSION  03/2016   C 3&4   Cyst removed from breast  1960   FOOT SURGERY  01/2009   Left   HERNIA REPAIR  1956   at age 35 (single)   HERNIA REPAIR  1960   (double)   Left 2nd toe removed  03/2017   REVERSE SHOULDER ARTHROPLASTY Left 03/27/2024   Procedure: ARTHROPLASTY, SHOULDER, TOTAL, REVERSE;  Surgeon: Genelle Standing, MD;  Location: [facility];  Service: Orthopedics;  Laterality: Left;   SPINE SURGERY     TONSILLECTOMY AND ADENOIDECTOMY  ~ 1950   VASECTOMY  ~ 1984   Patient Active Problem List   Diagnosis Date Noted   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 03/27/2024   Leg pain 03/21/2024   Shoulder pain 12/07/2023   Change in stool 11/02/2023   Nasal lesion 06/02/2023   Occipital neuralgia 10/12/2020   Wound of right leg, initial encounter  07/03/2019   Healthcare maintenance 03/04/2017   Foot pain 07/22/2016   Cervical stenosis of spine 01/06/2016   thyroid  nodule-benign path on bx 01/2016 01/06/2016   Radicular pain in right arm 12/22/2015   Radicular leg pain 12/22/2015   Back pain 07/06/2015   Constipation 07/06/2015   FH: CAD (coronary artery disease) 05/23/2015   Family history of ischemic heart disease and other diseases of the circulatory system 05/23/2015   Advance care planning 11/26/2014   Pain in joint, shoulder region 11/26/2014   Medicare annual wellness visit, subsequent 09/27/2012   GERD (gastroesophageal reflux disease) 07/05/2012   Irritability 03/29/2011   Advance directive on file 01/09/2011   Pain in joint, other site 01/04/2011   Arthropathy 09/02/2010   NEPHROLITHIASIS, HX OF 09/02/2010   Diabetes mellitus without complication (HCC) 09/01/2010   HLD (hyperlipidemia) 10/24/2009   SNORING 10/24/2009   FOOT PAIN, BILATERAL 10/06/2009   Pes planus 10/06/2009   Acquired flat foot 10/06/2009    PCP: Cleatus Arlyss RAMAN, MD  REFERRING PROVIDER: Genelle Standing, MD  REFERRING DIAG: S46.012A (ICD-10-CM) - Traumatic complete tear of left rotator  cuff, initial encounter;  1. Left shoulder reverse shoulder arthroplasty 2. Left shoulder biceps tenodesis  THERAPY DIAG:  Acute pain of left shoulder  Muscle weakness (generalized)  Stiffness of left shoulder, not elsewhere classified  Impingement syndrome of left shoulder  Other symptoms and signs involving the musculoskeletal system  Rationale for Evaluation and Treatment: Rehabilitation  ONSET DATE: DOS 03/27/24  SUBJECTIVE:                                                                                                                                                                                      SUBJECTIVE STATEMENT: Patient states that he is doing good today and has no pain. Mild soreness in the front of shoulder after last session.  States that the front of his shoulder is more tender today than it has been.    EVAL: Patient states shoulder has been doing good. Using sling. Was very active prior to injury/surgery with gardening/yard work Hand dominance: Right  PERTINENT HISTORY: DM, HLD, chronic shoulder pain with fall in March while exiting ski lift with RC tear  PAIN:  Are you having pain? No 0/10  today, 0/10 over past week as well   PRECAUTIONS: Other: L reverse TSA   RED FLAGS: None   WEIGHT BEARING RESTRICTIONS: NWB LUE  FALLS:  Has patient fallen in last 6 months? Yes. Number of falls 1  PLOF: Independent  PATIENT GOALS:regain use of the arm  OBJECTIVE: (objective measures from initial evaluation unless otherwise dated)  OBSERVATION: using sling, waterproof dressing intact  PATIENT SURVEYS:  UEFS  Extreme difficulty/unable (0), Quite a bit of difficulty (1), Moderate difficulty (2), Little difficulty (3), No difficulty (4) Survey date:  03/30/24  Any of your usual work, household or school activities 0  2. Your usual hobbies, recreational/sport activities 1   3. Lifting a bag of groceries to waist level 2   4. Lifting a bag of groceries above your head 0  5. Grooming your hair 4  6. Pushing up on your hands (I.e. from bathtub or chair) 0  7. Preparing food (I.e. peeling/cutting) 1  8. Driving  3  9. Vacuuming, sweeping, or raking 4  10. Dressing  3  11. Doing up buttons 2  12. Using tools/appliances 1  13. Opening doors 0  14. Cleaning  3  15. Tying or lacing shoes 2  16. Sleeping  3  17. Laundering clothes (I.e. washing, ironing, folding) 3  18. Opening a jar 3  19. Throwing a ball 1  20. Carrying a small suitcase with your affected limb.  1  Score total:  35/80     COGNITION: Overall cognitive  status: Within functional limits for tasks assessed     SENSATION: WFL  POSTURE: rounded shoulders and forward head   UPPER EXTREMITY ROM: AROM RUE WFL  Passive ROM Left eval L  04/06/24 L 04/13/24 L 04/20/24 L 04/24/24 L 04/27/24 L 9/17 L  9/19  Shoulder flexion 100 PROM 110 AAROM 110* PROM, 120* AAROM supine  120-130* PROM (bit guarded today) 130* PROM and AROM  114 AROM 140 AAROM 145 AAROM 138 AAROM Improves to 149 AAROM 139 AAROM 148 AAROM improves to 153 AAROM  Shoulder extension          Shoulder abduction          Shoulder adduction          Shoulder internal rotation          Shoulder external rotation 20 25* next to body PROM setaed (very guarded), 30* AAROM seated  30* PROM arm next to body  30* PROM arm next to body       Elbow flexion          Elbow extension          Wrist flexion          Wrist extension          Wrist ulnar deviation          Wrist radial deviation          Wrist pronation          Wrist supination          (Blank rows = not tested) *=pain/symptoms  UPPER EXTREMITY MMT: NT due to post op status  MMT Right eval Left eval  Shoulder flexion    Shoulder extension    Shoulder abduction    Shoulder adduction    Shoulder internal rotation    Shoulder external rotation    Middle trapezius    Lower trapezius    Elbow flexion    Elbow extension    Wrist flexion    Wrist extension    Wrist ulnar deviation    Wrist radial deviation    Wrist pronation    Wrist supination    Grip strength (lbs)    (Blank rows = not tested) *=pain/symptoms    PALPATION:  LUE gross edema   TODAY'S TREATMENT:                                                                                                                                         DATE:  9/19 Manual: Grade II-III inferior glides in flexion, STM deltoid, tricep Supine AAROM flexion with 3# bar 2 x 10 flat table ABCs in flexion 3 sets 1# Sidelying ER 2 x 12 1#  Sidelying abduction 3x10  Standing functional IR strech with cane 10 x 10 second holds gentle Standing row GTB 1x15, progressed to BTB 1x15  05/02/24 Pulley flexion 4 minutes Supine AAROM flexion  with 2# bar 2 x 10  flat table Manual: Grade II-III inferior glides in flexion, STM deltoid, tricep Perturbations in flexion 3 x 20 Supine SA punch 3# 2 x 15 Sidelying ER 1 x 12, 1# 1 x 12 Standing  functional IR strech with cane 10 x 10 second holds gentle Standing row GTB 2x 10 Wall ladder flexion 1 x 10  04/27/24 Pulley flexion 4 minutes Manual: Grade II-III inferior glides in flexion, STM deltoid, tricep Supine AAROM flexion with 1# bar 2 x 10 flat table, 30 degree incline 1# 1 x 10 Prone shoulder extension 1# 3 x 10 Prone row 1# 3 x 10 Wall ladder flexion 1 x 10  04/24/24 Pulley flexion 4 minutes Supine AAROM flexion with 1# bar 2 x 10 Sidelying AROM for ABD just to shoulder height 2x10  Sidelying ER 2 x 10 Prone shoulder extension 3 x 10 Wall ladder flexion 1 x 10 Shoulder flexion ball rolls at table 1 x 10  04/20/24 PROM/stretching as appropriate supine; backed off on ER stretching even within current 30* limit, this was a bit more tender today Supine AROM for flexion 2x10 0# Sidelying AROM for ABD just to shoulder height 2x10 0# Supine serratus punches 2# x10  Supine serratus punches 2# CW/CCW circles x10 each way  Supine serratus punch + alphabet x1 with 2#  Pulleys for L shoulder elevation 15x3 second holds Scap retractions 20x3 seconds min tactile cues for form  Backward shoulder rolls x20 Min VC for good form   04/13/24 PROM/stretching as appropriate supine Supine AAROM x15 cane Supine AAROM scaption with cane x15 Supine chest press cane x15  Standing isometrics: flexion 12x3 seconds, ABD 12x3 seconds, extension 12x3 seconds, ER 12x3 seconds door frame   Education on post-op recovery and long term pace of regaining ROM/strength/full function, typical post-op limitations and sensations (3-4 months to functional, about a year for max recovery/function)  04/06/24 Education/corrections given on HEP form, reassured him that getting ROM back especially for ER after shoulder surgery does  take time, reviewed reverse TSA precautions  PROM as appropriate supine  Supine AAROM flexion x12 with cane  Supine chest presses with cane x12  Supine AAROM scaption x12 with cane, MinA from PT  Supine hand closing/opening with shoulder at 90* to help reduce hand edema  Seated scap retractions 20x3 seconds  Seated backward shoulder rolls x20  Seated ER AAROM seated sheet tucked to ribs cane x12   03/30/24 Eval, dressing change, HEP, education Wrist hand AROM Elbow AROM 1 x 10 Scap retraction 1 x 10 Seated AAROM ER 1 x 10 Supine AAROM well arm assisted flexion 1 x 10    PATIENT EDUCATION:  Education details: Patient educated on exam findings, POC, scope of PT, HEP, relevant anatomy/biomechanics, protocol/precautions. Person educated: Patient Education method: Explanation, Demonstration, and Handouts Education comprehension: verbalized understanding, returned demonstration, verbal cues required, and tactile cues required  HOME EXERCISE PROGRAM:  Access Code: [email or web address] URL: [email or web address] Date: 04/13/2024 Prepared by: Josette Rough  Exercises - Wrist AROM Flexion Extension  - 3 x daily - 7 x weekly - 2 sets - 10 reps - Seated Elbow Flexion and Extension AROM (Mirrored)  - 3 x daily - 7 x weekly - 2 sets - 10 reps - Circular Shoulder Pendulum with Table Support (Mirrored)  - 3 x daily - 7 x weekly - 2 sets - 10 reps - Seated Scapular Retraction  - 3 x daily - 7 x weekly - 2 sets -  10 reps - Seated Shoulder Flexion Towel Slide at Table Top (Mirrored)  - 3 x daily - 7 x weekly - 10 reps - 10 second hold - Supine Shoulder Flexion AAROM with Hands Clasped  - 3 x daily - 7 x weekly - 3 sets - 10 reps - Supine Shoulder External Rotation with Dowel  - 3 x daily - 7 x weekly - 2 sets - 10 reps - Standing Isometric Shoulder Flexion with Doorway - Arm Bent  - 1 x daily - 7 x weekly - 2 sets - 10 reps - 3 seconds  hold - Standing Isometric Shoulder Abduction with  Doorway - Arm Bent  - 1 x daily - 7 x weekly - 2 sets - 10 reps - 3 seconds  hold - Standing Isometric Shoulder Extension with Doorway - Arm Bent  - 1 x daily - 7 x weekly - 2 sets - 10 reps - 3 seconds  hold - Standing Isometric Shoulder External Rotation with Doorway  - 1 x daily - 7 x weekly - 2 sets - 10 reps - 3 seconds  hold   ASSESSMENT:  CLINICAL IMPRESSION: Patient tolerated exercises well with no significant increase in pain. Improved to 153 AAROM following manual. Required verbal cueing for mechanics during exercises. Fatigues quickly with strengthening exercises. Verbal and tactile cueing to reduce shoulder hike throughout session.    EVAL: Patient a 83 y.o. y.o. male who was seen today for physical therapy evaluation and treatment for L reverse TSA DOS 03/27/24. Patient presents with pain limited deficits in L shoulder strength, ROM, endurance, activity tolerance, and functional mobility with ADL. Patient is having to modify and restrict ADL as indicated by outcome measure score as well as subjective information and objective measures which is affecting overall participation. Patient will benefit from skilled physical therapy in order to improve function and reduce impairment.  OBJECTIVE IMPAIRMENTS: decreased activity tolerance, decreased endurance, decreased mobility, decreased ROM, decreased strength, increased muscle spasms, impaired flexibility, impaired UE functional use, improper body mechanics, and pain  ACTIVITY LIMITATIONS:  carrying, lifting, bending, sleeping, bed mobility, bathing, dressing, reach over head, hygiene/grooming, and caring for others  PARTICIPATION LIMITATIONS:  meal prep, cleaning, laundry, driving, shopping, community activity, and yard work  PERSONAL FACTORS: Age, Time since onset of injury/illness/exacerbation, and 1-2 comorbidities: DM, HLD are also affecting patient's functional outcome.   REHAB POTENTIAL: Good  CLINICAL DECISION MAKING:  Evolving/moderate complexity  EVALUATION COMPLEXITY: Moderate  GOALS: Goals reviewed with patient? Yes  SHORT TERM GOALS: Target date: 05/11/2024    Patient will be independent with HEP in order to improve functional outcomes. Baseline: Goal status: IN PROGRESS 04/06/24  2.  Patient will report at least 25% improvement in symptoms for improved quality of life. Baseline:  Goal status: IN PROGRESS 04/20/24  3.  Patient will demonstrate Elevation to 130 deg and ER to 30 deg - passive, active assisted or active  Baseline:  Goal status: MET 04/20/24  4.  Patient will be out of sling per surgeon approval for improved functional use of L shoulder. Baseline:  Goal status: MET 04/13/24   LONG TERM GOALS: Target date: 06/22/2024    Patient will report at least 75% improvement in symptoms for improved quality of life. Baseline:  Goal status: INITIAL  2.  Patient will improve UEFS score by at least 25 points in order to indicate improved tolerance to activity. Baseline:  Goal status: INITIAL  3.  Patient will demonstrate at least PROM/AAROM/AROM to:  Elevation - 145-160; ER - 40-50 ; functional IR to L1 in L shoulder for improved ability lift overhead. Baseline:  Goal status: INITIAL  4.  Patient will be able to return to all activities unrestricted for improved ability to perform yard work functions and participate with family.  Baseline:  Goal status: INITIAL  5.  Patient will demonstrate grade of 5/5 MMT grade in all tested musculature as evidence of improved strength to assist with lifting at home. Baseline:  Goal status: INITIAL   PLAN:  PT FREQUENCY: 1-2x/week  PT DURATION: 12 weeks  PLANNED INTERVENTIONS:[id number]- PT Re-evaluation, 97110-Therapeutic exercises, 97530- Therapeutic activity, [id number]- Neuromuscular re-education, 97535- Self Care, [id number]- Manual therapy, [phone]- Gait training, [phone]- Orthotic Fit/training, [phone]- Canalith repositioning, [id number]- Aquatic Therapy, [phone]-  Splinting, [phone]- Wound care (first 20 sq cm), 97598- Wound care (each additional 20 sq cm)Patient/Family education, Balance training, Stair training, Taping, Dry Needling, Joint mobilization, Joint manipulation, Spinal manipulation, Spinal mobilization, Scar mobilization, and DME instructions.  PLAN FOR NEXT SESSION: progress with reverse TSA protocol,  3 weeks out as of  04/17/24. OK to do active ROM and strength per MD note 04/09/24   Lili Finder, Student-PT 05/04/2024, 8:41 AM   This entire session was performed under direct supervision and direction of a licensed therapist/therapist assistant . I have personally read, edited and approve of the note as written. 9:34 AM, 05/04/24 Prentice CANDIE Stains PT, DPT Physical Therapist at [facility]

## 2024-05-07 ENCOUNTER — Ambulatory Visit (INDEPENDENT_AMBULATORY_CARE_PROVIDER_SITE_OTHER): Admitting: Orthopaedic Surgery

## 2024-05-07 DIAGNOSIS — G8929 Other chronic pain: Secondary | ICD-10-CM

## 2024-05-07 DIAGNOSIS — M25512 Pain in left shoulder: Secondary | ICD-10-CM

## 2024-05-07 NOTE — Progress Notes (Signed)
 Post Operative Evaluation    Procedure/Date of Surgery: Left shoulder reverse shoulder arthroplasty 8/12  Interval History:    Presents 6 weeks status post reverse shoulder arthroplasty.  Overall he is doing quite well.  Range of motion and strength are coming along nicely.  He is occasionally getting some soreness in the shoulder   PMH/PSH/Family History/Social History/Meds/Allergies:    Past Medical History:  Diagnosis Date   Abnormal GGT test    With otherwise normal work up.   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)    Persistent disorder of initiating or maintaining sleep    Pes planus    Rosacea    Snoring    Past Surgical History:  Procedure Laterality Date   APPENDECTOMY  1949   at age 23   CERVICAL FUSION  03/2016   C 3&4   Cyst removed from breast  1960   FOOT SURGERY  01/2009   Left   HERNIA REPAIR  1956   at age 63 (single)   HERNIA REPAIR  1960   (double)   Left 2nd toe removed  03/2017   REVERSE SHOULDER ARTHROPLASTY Left 03/27/2024   Procedure: ARTHROPLASTY, SHOULDER, TOTAL, REVERSE;  Surgeon: Genelle Standing, MD;  Location: [facility];  Service: Orthopedics;  Laterality: Left;   SPINE SURGERY     TONSILLECTOMY AND ADENOIDECTOMY  ~ 1950   VASECTOMY  ~ 1984   Social History   Socioeconomic History   Marital status: Married    Spouse name: Not on file   Number of children: 4   Years of education: Not on file   Highest education level: Master's degree (e.g., MA, MS, MEng, MEd, MSW, MBA)  Occupational History   Occupation: Retired Nov. 2007 27-1/2 years of service    Employer: RETIRED    Comment: Paediatric nurse   Occupation: Works part time at a  desk job.  Tobacco Use   Smoking status: Never   Smokeless tobacco: Former    Types: Engineer, drilling   Vaping status: Never Used  Substance and Sexual Activity   Alcohol use: Yes    Comment: 1-2 glass wine daily   Drug use: No   Sexual activity: Not Currently  Other Topics Concern   Not on file  Social History Narrative   Education:  Masters   Charity fundraiser at 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   4 kids, 2 are MD's   7 grandkids   [employer] and stays active, enjoys skiing, very active, walks at least 1/2 mile daily   Social Drivers of Corporate investment banker Strain: Low Risk  (03/19/2024)   Overall Financial Resource Strain (CARDIA)    Difficulty of Paying Living Expenses: Not very hard  Food Insecurity: No Food Insecurity (03/19/2024)   Hunger Vital Sign    Worried About Running Out of Food in the Last Year: Never true    Ran Out of Food in the Last Year: Never true  Transportation  Needs: No Transportation Needs (03/19/2024)   PRAPARE - Administrator, Civil Service (Medical): No    Lack of Transportation (Non-Medical): No  Physical Activity: Sufficiently Active (03/19/2024)   Exercise Vital Sign    Days of Exercise per Week: 7 days    Minutes of Exercise per Session: 30 min  Stress: No Stress Concern Present (03/19/2024)   [employer] of Occupational Health - Occupational Stress Questionnaire    Feeling of Stress: Not at all  Social Connections: Moderately Isolated (03/19/2024)   Social Connection and Isolation Panel    Frequency of Communication with Friends and Family: Twice a week    Frequency of Social Gatherings with Friends and Family: Once a week    Attends Religious Services: Never    Database administrator or Organizations: No    Attends Engineer, structural: Not on file    Marital Status: Married   Family History  Problem Relation Age of Onset   Heart disease Father 65       First MI   Hyperlipidemia Father    Heart attack  Father    Diabetes Brother    Hyperlipidemia Brother    Hypertension Brother    Heart disease Brother        CAD s/p stent   CAD Brother    Heart disease Brother    Heart disease Brother    Diabetes Sister        DM2   Hyperlipidemia Sister    Heart disease Sister    Arthritis Other    Diabetes Other    ADD / ADHD Son    Depression Sister    Prostate cancer Neg Hx    Colon cancer Neg Hx    Allergies  Allergen Reactions   Azo [Phenazopyridine] Other (See Comments)    diarrhea   Bee Venom    Erythromycin     Oral ulcers (but tolerates zithromax)    Animator (Solenopsis Invicta) Swelling   Oxycodone      Intolerant- but can tolerate hydrocodone .    Pneumococcal Vaccine Polyvalent     REACTION: rash   Wasp Venom     Exaggerated local reaction   Current Outpatient Medications  Medication Sig Dispense Refill   Accu-Chek FastClix Lancets MISC USE AS NEEDED TO CHECK SUGAR DAILY. 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 102 each 3   aspirin  81 MG EC tablet Take 81 mg by mouth daily.     aspirin  EC 325 MG tablet Take 1 tablet (325 mg total) by mouth daily. (Patient not taking: Reported on 03/20/2024) 14 tablet 0   B Complex-C-Folic Acid (SUPER B COMPLEX/FA/VIT C) TABS      Blood Glucose Monitoring Suppl (ONETOUCH VERIO) w/Device KIT Use to check blood sugar up to 3 times a day. Dx: E11.9 1 kit 0   EPINEPHrine  0.3 mg/0.3 mL IJ SOAJ injection Inject 0.3 mLs (0.3 mg total) into the muscle once. (Patient not taking: Reported on 03/20/2024) 2 Device 1   fluorouracil  (EFUDEX ) 5 % cream Apply topically 2 (two) times daily.     glimepiride  (AMARYL ) 2 MG tablet TAKE 1 TABLET BY MOUTH DAILY  WITH BREAKFAST 90 tablet 3   HYDROcodone -acetaminophen  (NORCO/VICODIN) 5-325 MG tablet Take 1 tablet by mouth every 6 (six) hours as needed for moderate pain (pain score 4-6). 20 tablet 0   insulin  glargine (LANTUS  SOLOSTAR) 100 UNIT/ML Solostar Pen Inject 7 Units into the skin daily.     Insulin  Pen Needle  30G X 5 MM MISC Use daily with insulin  pen. 100 each 3   metFORMIN  (GLUCOPHAGE ) 1000 MG tablet Take 1 tablet (1,000 mg total) by mouth daily with breakfast. 180 tablet 3   metFORMIN  (GLUCOPHAGE -XR) 500 MG 24 hr tablet Take 2 tablets (1,000 mg total) by mouth at bedtime. 180 tablet 1   nitroGLYCERIN  (NITROSTAT ) 0.4 MG SL tablet Place 1 tablet (0.4 mg total) under the tongue every 5 (five) minutes as needed for chest pain (max 3 doses/15 min.call MD/to ER if used). 25 tablet 3   ONETOUCH VERIO test strip USE TO CHECK BLOOD SUGAR UP TO 3 TIMES DAILY. DX E11.9 100 strip 25   rosuvastatin  (CRESTOR ) 10 MG tablet Take 1 tablet (10 mg total) by mouth daily. 90 tablet 3   sertraline  (ZOLOFT ) 50 MG tablet Take 1 tablet (50 mg total) by mouth daily. 90 tablet 3   sitaGLIPtin  (JANUVIA ) 100 MG tablet Take 1 tablet (100 mg total) by mouth daily. 90 tablet 3   No current facility-administered medications for this visit.   No results found.  Review of Systems:   A ROS was performed including pertinent positives and negatives as documented in the HPI.   Musculoskeletal Exam:    There were no vitals taken for this visit.  Left shoulder is well-appearing without erythema or drainage.  Active forward elevation is 120 degrees external rotation at side is 20 degrees internal rotation deferred today distal neurosensory exam is intact  Imaging:    3 views left shoulder: This post left reverse shoulder arthroplasty without evidence of complication  I personally reviewed and interpreted the radiographs.   Assessment:   6 weeks status post left reverse shoulder arthroplasty overall doing extremely well.  At this time I have advised him to take it somewhat easy in terms of strengthening as he is experiencing some soreness and we would want to avoid any type of acromial stress reaction but that being said I would like him to continue to work on active and passive range of motion.  Plan :    - return to clinic  6 weeks for reassessment      I personally saw and evaluated the patient, and participated in the management and treatment plan.  Elspeth Parker, MD Attending Physician, Orthopedic Surgery  This document was dictated using Dragon voice recognition software. A reasonable attempt at proof reading has been made to minimize errors.

## 2024-05-08 ENCOUNTER — Ambulatory Visit (HOSPITAL_BASED_OUTPATIENT_CLINIC_OR_DEPARTMENT_OTHER): Admitting: Physical Therapy

## 2024-05-08 ENCOUNTER — Encounter (HOSPITAL_BASED_OUTPATIENT_CLINIC_OR_DEPARTMENT_OTHER): Payer: Self-pay | Admitting: Physical Therapy

## 2024-05-08 DIAGNOSIS — M7542 Impingement syndrome of left shoulder: Secondary | ICD-10-CM | POA: Diagnosis not present

## 2024-05-08 DIAGNOSIS — M25612 Stiffness of left shoulder, not elsewhere classified: Secondary | ICD-10-CM | POA: Diagnosis not present

## 2024-05-08 DIAGNOSIS — R29898 Other symptoms and signs involving the musculoskeletal system: Secondary | ICD-10-CM | POA: Diagnosis not present

## 2024-05-08 DIAGNOSIS — M25512 Pain in left shoulder: Secondary | ICD-10-CM | POA: Diagnosis not present

## 2024-05-08 DIAGNOSIS — M6281 Muscle weakness (generalized): Secondary | ICD-10-CM

## 2024-05-08 NOTE — Therapy (Signed)
 OUTPATIENT PHYSICAL THERAPY SHOULDER TREATMENT    Patient Name: Greg Rogers. MRN: [id number] DOB:March 07, 1941, 83 y.o., male Today's Date: 05/08/2024  END OF SESSION:  PT End of Session - 05/08/24 0855     Visit Number 9    Number of Visits 24    Date for Recertification  06/22/24    Authorization Type MCR    Progress Note Due on Visit 10    PT Start Time 0847    PT Stop Time 0926    PT Time Calculation (min) 39 min    Activity Tolerance Patient tolerated treatment well    Behavior During Therapy [facility] for tasks assessed/performed            Past Medical History:  Diagnosis Date   Abnormal GGT test    With otherwise normal work up.   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)    Persistent disorder of initiating or maintaining sleep    Pes planus    Rosacea    Snoring    Past Surgical History:  Procedure Laterality Date   APPENDECTOMY  1949   at age 33   CERVICAL FUSION  03/2016   C 3&4   Cyst removed from breast  1960   FOOT SURGERY  01/2009   Left   HERNIA REPAIR  1956   at age 83 (single)   HERNIA REPAIR  1960   (double)   Left 2nd toe removed  03/2017   REVERSE SHOULDER ARTHROPLASTY Left 03/27/2024   Procedure: ARTHROPLASTY, SHOULDER, TOTAL, REVERSE;  Surgeon: Genelle Standing, MD;  Location: [facility];  Service: Orthopedics;  Laterality: Left;   SPINE SURGERY     TONSILLECTOMY AND ADENOIDECTOMY  ~ 1950   VASECTOMY  ~ 1984   Patient Active Problem List   Diagnosis Date Noted   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 03/27/2024   Leg pain 03/21/2024   Shoulder pain 12/07/2023   Change in stool 11/02/2023   Nasal lesion 06/02/2023   Occipital neuralgia 10/12/2020   Wound of right leg, initial encounter  07/03/2019   Healthcare maintenance 03/04/2017   Foot pain 07/22/2016   Cervical stenosis of spine 01/06/2016   thyroid  nodule-benign path on bx 01/2016 01/06/2016   Radicular pain in right arm 12/22/2015   Radicular leg pain 12/22/2015   Back pain 07/06/2015   Constipation 07/06/2015   FH: CAD (coronary artery disease) 05/23/2015   Family history of ischemic heart disease and other diseases of the circulatory system 05/23/2015   Advance care planning 11/26/2014   Pain in joint, shoulder region 11/26/2014   Medicare annual wellness visit, subsequent 09/27/2012   GERD (gastroesophageal reflux disease) 07/05/2012   Irritability 03/29/2011   Advance directive on file 01/09/2011   Pain in joint, other site 01/04/2011   Arthropathy 09/02/2010   NEPHROLITHIASIS, HX OF 09/02/2010   Diabetes mellitus without complication (HCC) 09/01/2010   HLD (hyperlipidemia) 10/24/2009   SNORING 10/24/2009   FOOT PAIN, BILATERAL 10/06/2009   Pes planus 10/06/2009   Acquired flat foot 10/06/2009    PCP: Cleatus Arlyss RAMAN, MD  REFERRING PROVIDER: Genelle Standing, MD  REFERRING DIAG: S46.012A (ICD-10-CM) - Traumatic complete tear of left  rotator cuff, initial encounter;  1. Left shoulder reverse shoulder arthroplasty 2. Left shoulder biceps tenodesis  THERAPY DIAG:  Acute pain of left shoulder  Muscle weakness (generalized)  Stiffness of left shoulder, not elsewhere classified  Rationale for Evaluation and Treatment: Rehabilitation  ONSET DATE: DOS 03/27/24  SUBJECTIVE:                                                                                                                                                                                      SUBJECTIVE STATEMENT:  Shoulder is fine, its been feeling a little sensitive the past few days. Saw Dr. Genelle, he's happy with how the shoulder is looking overall.    EVAL: Patient states shoulder has been doing good. Using sling. Was very active  prior to injury/surgery with gardening/yard work Hand dominance: Right  PERTINENT HISTORY: DM, HLD, chronic shoulder pain with fall in March while exiting ski lift with RC tear  PAIN:  Are you having pain? No 0/10  today, 0/10 over past week as well   PRECAUTIONS: Other: L reverse TSA   RED FLAGS: None   WEIGHT BEARING RESTRICTIONS: NWB LUE  FALLS:  Has patient fallen in last 6 months? Yes. Number of falls 1  PLOF: Independent  PATIENT GOALS:regain use of the arm  OBJECTIVE: (objective measures from initial evaluation unless otherwise dated)  OBSERVATION: using sling, waterproof dressing intact  PATIENT SURVEYS:  UEFS  Extreme difficulty/unable (0), Quite a bit of difficulty (1), Moderate difficulty (2), Little difficulty (3), No difficulty (4) Survey date:  03/30/24  Any of your usual work, household or school activities 0  2. Your usual hobbies, recreational/sport activities 1   3. Lifting a bag of groceries to waist level 2   4. Lifting a bag of groceries above your head 0  5. Grooming your hair 4  6. Pushing up on your hands (I.e. from bathtub or chair) 0  7. Preparing food (I.e. peeling/cutting) 1  8. Driving  3  9. Vacuuming, sweeping, or raking 4  10. Dressing  3  11. Doing up buttons 2  12. Using tools/appliances 1  13. Opening doors 0  14. Cleaning  3  15. Tying or lacing shoes 2  16. Sleeping  3  17. Laundering clothes (I.e. washing, ironing, folding) 3  18. Opening a jar 3  19. Throwing a ball 1  20. Carrying a small suitcase with your affected limb.  1  Score total:  35/80     05/08/24- UEFS 15/80 via online version, later noted questions were different than one in epic   COGNITION: Overall cognitive status: Within functional limits for tasks assessed  SENSATION: WFL  POSTURE: rounded shoulders and forward head   UPPER EXTREMITY ROM: AROM RUE WFL  Passive ROM Left eval L 04/06/24 L 04/13/24 L 04/20/24 L 04/24/24 L 04/27/24 L 9/17 L  9/19   Shoulder flexion 100 PROM 110 AAROM 110* PROM, 120* AAROM supine  120-130* PROM (bit guarded today) 130* PROM and AROM  114 AROM 140 AAROM 145 AAROM 138 AAROM Improves to 149 AAROM 139 AAROM 148 AAROM improves to 153 AAROM  Shoulder extension          Shoulder abduction          Shoulder adduction          Shoulder internal rotation          Shoulder external rotation 20 25* next to body PROM setaed (very guarded), 30* AAROM seated  30* PROM arm next to body  30* PROM arm next to body       Elbow flexion          Elbow extension          Wrist flexion          Wrist extension          Wrist ulnar deviation          Wrist radial deviation          Wrist pronation          Wrist supination          (Blank rows = not tested) *=pain/symptoms  UPPER EXTREMITY MMT: NT due to post op status  MMT Right eval Left eval  Shoulder flexion    Shoulder extension    Shoulder abduction    Shoulder adduction    Shoulder internal rotation    Shoulder external rotation    Middle trapezius    Lower trapezius    Elbow flexion    Elbow extension    Wrist flexion    Wrist extension    Wrist ulnar deviation    Wrist radial deviation    Wrist pronation    Wrist supination    Grip strength (lbs)    (Blank rows = not tested) *=pain/symptoms    PALPATION:  LUE gross edema   TODAY'S TREATMENT:                                                                                                                                         DATE:   05/08/24  Wall ladder for flexion x15  Pulleys x4 minutes Gentle IR stretch with towel x10 L stretch at high table 10x3 seconds for shoulder flexion Supine shoulder flexion 2# 2x10 Sidelying shoulder ABD 0# 2x12 Supine serratus punches 2# x12 Supine serratus punch + CW/CCW circles x12 each 2# Supine serratus punch 2# alphabet x1    9/19 Manual: Grade II-III inferior glides in flexion, STM deltoid, tricep Supine AAROM flexion with 3# bar 2  x 10  flat table ABCs in flexion 3 sets 1# Sidelying ER 2 x 12 1#  Sidelying abduction 3x10  Standing functional IR strech with cane 10 x 10 second holds gentle Standing row GTB 1x15, progressed to BTB 1x15  05/02/24 Pulley flexion 4 minutes Supine AAROM flexion with 2# bar 2 x 10 flat table Manual: Grade II-III inferior glides in flexion, STM deltoid, tricep Perturbations in flexion 3 x 20 Supine SA punch 3# 2 x 15 Sidelying ER 1 x 12, 1# 1 x 12 Standing  functional IR strech with cane 10 x 10 second holds gentle Standing row GTB 2x 10 Wall ladder flexion 1 x 10  04/27/24 Pulley flexion 4 minutes Manual: Grade II-III inferior glides in flexion, STM deltoid, tricep Supine AAROM flexion with 1# bar 2 x 10 flat table, 30 degree incline 1# 1 x 10 Prone shoulder extension 1# 3 x 10 Prone row 1# 3 x 10 Wall ladder flexion 1 x 10       PATIENT EDUCATION:  Education details: Patient educated on exam findings, POC, scope of PT, HEP, relevant anatomy/biomechanics, protocol/precautions. Person educated: Patient Education method: Explanation, Demonstration, and Handouts Education comprehension: verbalized understanding, returned demonstration, verbal cues required, and tactile cues required  HOME EXERCISE PROGRAM:  Access Code: [email or web address] URL: [email or web address] Date: 04/13/2024 Prepared by: Josette Rough  Exercises - Wrist AROM Flexion Extension  - 3 x daily - 7 x weekly - 2 sets - 10 reps - Seated Elbow Flexion and Extension AROM (Mirrored)  - 3 x daily - 7 x weekly - 2 sets - 10 reps - Circular Shoulder Pendulum with Table Support (Mirrored)  - 3 x daily - 7 x weekly - 2 sets - 10 reps - Seated Scapular Retraction  - 3 x daily - 7 x weekly - 2 sets - 10 reps - Seated Shoulder Flexion Towel Slide at Table Top (Mirrored)  - 3 x daily - 7 x weekly - 10 reps - 10 second hold - Supine Shoulder Flexion AAROM with Hands Clasped  - 3 x daily - 7 x weekly - 3 sets - 10 reps -  Supine Shoulder External Rotation with Dowel  - 3 x daily - 7 x weekly - 2 sets - 10 reps - Standing Isometric Shoulder Flexion with Doorway - Arm Bent  - 1 x daily - 7 x weekly - 2 sets - 10 reps - 3 seconds  hold - Standing Isometric Shoulder Abduction with Doorway - Arm Bent  - 1 x daily - 7 x weekly - 2 sets - 10 reps - 3 seconds  hold - Standing Isometric Shoulder Extension with Doorway - Arm Bent  - 1 x daily - 7 x weekly - 2 sets - 10 reps - 3 seconds  hold - Standing Isometric Shoulder External Rotation with Doorway  - 1 x daily - 7 x weekly - 2 sets - 10 reps - 3 seconds  hold   ASSESSMENT:  CLINICAL IMPRESSION:   Arrives today doing well, MD is happy with his progress but per note from 05/07/24 advises caution with intensity of strength training to help avoid acromial stress reaction. Mixed ROM work with careful strengthening as tolerated today, will progress as appropriate and tolerated. Encouraged taking a rest day, or at the very least a day of 50% effort, from yard work (especially repetitive tasks) to help reduce soreness and any inflammation in the shoulder as well.     EVAL: Patient a 83 y.o.  y.o. male who was seen today for physical therapy evaluation and treatment for L reverse TSA DOS 03/27/24. Patient presents with pain limited deficits in L shoulder strength, ROM, endurance, activity tolerance, and functional mobility with ADL. Patient is having to modify and restrict ADL as indicated by outcome measure score as well as subjective information and objective measures which is affecting overall participation. Patient will benefit from skilled physical therapy in order to improve function and reduce impairment.  OBJECTIVE IMPAIRMENTS: decreased activity tolerance, decreased endurance, decreased mobility, decreased ROM, decreased strength, increased muscle spasms, impaired flexibility, impaired UE functional use, improper body mechanics, and pain  ACTIVITY LIMITATIONS:  carrying,  lifting, bending, sleeping, bed mobility, bathing, dressing, reach over head, hygiene/grooming, and caring for others  PARTICIPATION LIMITATIONS:  meal prep, cleaning, laundry, driving, shopping, community activity, and yard work  PERSONAL FACTORS: Age, Time since onset of injury/illness/exacerbation, and 1-2 comorbidities: DM, HLD are also affecting patient's functional outcome.   REHAB POTENTIAL: Good  CLINICAL DECISION MAKING: Evolving/moderate complexity  EVALUATION COMPLEXITY: Moderate  GOALS: Goals reviewed with patient? Yes  SHORT TERM GOALS: Target date: 05/11/2024    Patient will be independent with HEP in order to improve functional outcomes. Baseline: Goal status: IN PROGRESS 04/06/24  2.  Patient will report at least 25% improvement in symptoms for improved quality of life. Baseline:  Goal status: IN PROGRESS 04/20/24  3.  Patient will demonstrate Elevation to 130 deg and ER to 30 deg - passive, active assisted or active  Baseline:  Goal status: MET 04/20/24  4.  Patient will be out of sling per surgeon approval for improved functional use of L shoulder. Baseline:  Goal status: MET 04/13/24   LONG TERM GOALS: Target date: 06/22/2024    Patient will report at least 75% improvement in symptoms for improved quality of life. Baseline:  Goal status: INITIAL  2.  Patient will improve UEFS score by at least 25 points in order to indicate improved tolerance to activity. Baseline:  Goal status: INITIAL  3.  Patient will demonstrate at least PROM/AAROM/AROM to: Elevation - 145-160; ER - 40-50 ; functional IR to L1 in L shoulder for improved ability lift overhead. Baseline:  Goal status: INITIAL  4.  Patient will be able to return to all activities unrestricted for improved ability to perform yard work functions and participate with family.  Baseline:  Goal status: INITIAL  5.  Patient will demonstrate grade of 5/5 MMT grade in all tested musculature as evidence of  improved strength to assist with lifting at home. Baseline:  Goal status: INITIAL   PLAN:  PT FREQUENCY: 1-2x/week  PT DURATION: 12 weeks  PLANNED INTERVENTIONS:[id number]- PT Re-evaluation, 97110-Therapeutic exercises, 97530- Therapeutic activity, [id number]- Neuromuscular re-education, 97535- Self Care, [id number]- Manual therapy, [phone]- Gait training, [phone]- Orthotic Fit/training, [phone]- Canalith repositioning, [id number]- Aquatic Therapy, [phone]- Splinting, [phone]- Wound care (first 20 sq cm), 97598- Wound care (each additional 20 sq cm)Patient/Family education, Balance training, Stair training, Taping, Dry Needling, Joint mobilization, Joint manipulation, Spinal manipulation, Spinal mobilization, Scar mobilization, and DME instructions.  PLAN FOR NEXT SESSION: progress with reverse TSA protocol,  6 weeks out as of  05/08/24. Continue ROM work but caution with intensity of strengthening, less than 5# and keep reps low to avoid acromial stress reaction per MD. 10th visit progress note next time    Josette Rough, PT, DPT 05/08/24 9:27 AM

## 2024-05-11 ENCOUNTER — Ambulatory Visit (HOSPITAL_BASED_OUTPATIENT_CLINIC_OR_DEPARTMENT_OTHER): Admitting: Physical Therapy

## 2024-05-11 ENCOUNTER — Encounter (HOSPITAL_BASED_OUTPATIENT_CLINIC_OR_DEPARTMENT_OTHER): Payer: Self-pay | Admitting: Physical Therapy

## 2024-05-11 DIAGNOSIS — R29898 Other symptoms and signs involving the musculoskeletal system: Secondary | ICD-10-CM | POA: Diagnosis not present

## 2024-05-11 DIAGNOSIS — M25612 Stiffness of left shoulder, not elsewhere classified: Secondary | ICD-10-CM

## 2024-05-11 DIAGNOSIS — M25512 Pain in left shoulder: Secondary | ICD-10-CM

## 2024-05-11 DIAGNOSIS — M7542 Impingement syndrome of left shoulder: Secondary | ICD-10-CM | POA: Diagnosis not present

## 2024-05-11 DIAGNOSIS — M6281 Muscle weakness (generalized): Secondary | ICD-10-CM

## 2024-05-11 NOTE — Therapy (Signed)
 OUTPATIENT PHYSICAL THERAPY SHOULDER PROGRESS NOTE   Patient Name: Greg Rogers. MRN: [id number] DOB:Mar 30, 1941, 83 y.o., male Today's Date: 05/11/2024   Progress Note Reporting Period 03/30/24 to 05/11/24  See note below for Objective Data and Assessment of Progress/Goals.      END OF SESSION:  PT End of Session - 05/11/24 0915     Visit Number 10    Number of Visits 24    Date for Recertification  06/22/24    Authorization Type MCR    Progress Note Due on Visit 20    PT Start Time 0848    PT Stop Time 0926    PT Time Calculation (min) 38 min    Activity Tolerance Patient tolerated treatment well;Patient limited by pain    Behavior During Therapy [facility] for tasks assessed/performed             Past Medical History:  Diagnosis Date   Abnormal GGT test    With otherwise normal work up.   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)    Persistent disorder of initiating or maintaining sleep    Pes planus    Rosacea    Snoring    Past Surgical History:  Procedure Laterality Date   APPENDECTOMY  1949   at age 65   CERVICAL FUSION  03/2016   C 3&4   Cyst removed from breast  1960   FOOT SURGERY  01/2009   Left   HERNIA REPAIR  1956   at age 24 (single)   HERNIA REPAIR  1960   (double)   Left 2nd toe removed  03/2017   REVERSE SHOULDER ARTHROPLASTY Left 03/27/2024   Procedure: ARTHROPLASTY, SHOULDER, TOTAL, REVERSE;  Surgeon: Genelle Standing, MD;  Location: [facility];  Service: Orthopedics;  Laterality: Left;   SPINE SURGERY     TONSILLECTOMY AND ADENOIDECTOMY  ~ 1950   VASECTOMY  ~ 1984   Patient Active Problem List   Diagnosis Date Noted   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 03/27/2024   Leg pain 03/21/2024    Shoulder pain 12/07/2023   Change in stool 11/02/2023   Nasal lesion 06/02/2023   Occipital neuralgia 10/12/2020   Wound of right leg, initial encounter 07/03/2019   Healthcare maintenance 03/04/2017   Foot pain 07/22/2016   Cervical stenosis of spine 01/06/2016   thyroid  nodule-benign path on bx 01/2016 01/06/2016   Radicular pain in right arm 12/22/2015   Radicular leg pain 12/22/2015   Back pain 07/06/2015   Constipation 07/06/2015   FH: CAD (coronary artery disease) 05/23/2015   Family history of ischemic heart disease and other diseases of the circulatory system 05/23/2015   Advance care planning 11/26/2014   Pain in joint, shoulder region 11/26/2014   Medicare annual wellness visit, subsequent 09/27/2012   GERD (gastroesophageal reflux disease) 07/05/2012   Irritability 03/29/2011   Advance directive on file 01/09/2011   Pain in joint, other site 01/04/2011   Arthropathy 09/02/2010   NEPHROLITHIASIS, HX OF 09/02/2010   Diabetes mellitus without complication (HCC) 09/01/2010   HLD (hyperlipidemia) 10/24/2009   SNORING 10/24/2009   FOOT PAIN, BILATERAL 10/06/2009   Pes planus 10/06/2009   Acquired  flat foot 10/06/2009    PCP: Cleatus Arlyss RAMAN, MD  REFERRING PROVIDER: Genelle Standing, MD  REFERRING DIAG: [phone] (ICD-10-CM) - 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, initial encounter;  1. Left shoulder reverse shoulder arthroplasty 2. Left shoulder biceps tenodesis  THERAPY DIAG:  Acute pain of left shoulder  Muscle weakness (generalized)  Stiffness of left shoulder, not elsewhere classified  Rationale for Evaluation and Treatment: Rehabilitation  ONSET DATE: DOS 03/27/24  SUBJECTIVE:                                                                                                                                                                                      SUBJECTIVE STATEMENT:  Shoulder is feeling good, I ended up taking a day and a half off and  it helped. Realized I over did it in the yard before I saw you last time.    EVAL: Patient states shoulder has been doing good. Using sling. Was very active prior to injury/surgery with gardening/yard work Hand dominance: Right  PERTINENT HISTORY: DM, HLD, chronic shoulder pain with fall in March while exiting ski lift with RC tear  PAIN:  Are you having pain? No 0/10 this morning   PRECAUTIONS: Other: L reverse TSA   RED FLAGS: None   WEIGHT BEARING RESTRICTIONS: NWB LUE  FALLS:  Has patient fallen in last 6 months? Yes. Number of falls 1  PLOF: Independent  PATIENT GOALS:regain use of the arm  OBJECTIVE: (objective measures from initial evaluation unless otherwise dated)  OBSERVATION: using sling, waterproof dressing intact  PATIENT SURVEYS:  UEFS  Extreme difficulty/unable (0), Quite a bit of difficulty (1), Moderate difficulty (2), Little difficulty (3), No difficulty (4) Survey date:  03/30/24  Any of your usual work, household or school activities 0  2. Your usual hobbies, recreational/sport activities 1   3. Lifting a bag of groceries to waist level 2   4. Lifting a bag of groceries above your head 0  5. Grooming your hair 4  6. Pushing up on your hands (I.e. from bathtub or chair) 0  7. Preparing food (I.e. peeling/cutting) 1  8. Driving  3  9. Vacuuming, sweeping, or raking 4  10. Dressing  3  11. Doing up buttons 2  12. Using tools/appliances 1  13. Opening doors 0  14. Cleaning  3  15. Tying or lacing shoes 2  16. Sleeping  3  17. Laundering clothes (I.e. washing, ironing, folding) 3  18. Opening a jar 3  19. Throwing a ball 1  20. Carrying a small suitcase with your affected limb.  1  Score total:  35/80  05/08/24- UEFS 15/80 via online version, later noted questions were different than one in epic   COGNITION: Overall cognitive status: Within functional limits for tasks assessed     SENSATION: WFL  POSTURE: rounded shoulders and forward  head   UPPER EXTREMITY ROM: AROM RUE WFL  Passive ROM Left eval L 04/06/24 L 04/13/24 L 04/20/24 L 04/24/24 L 04/27/24 L 9/17 L  9/19 L 9/26  Shoulder flexion 100 PROM 110 AAROM 110* PROM, 120* AAROM supine  120-130* PROM (bit guarded today) 130* PROM and AROM  114 AROM 140 AAROM 145 AAROM 138 AAROM Improves to 149 AAROM 139 AAROM 148 AAROM improves to 153 AAROM 144* AAROM   Shoulder extension           Shoulder abduction           Shoulder adduction           Shoulder internal rotation         Mid glute. L5 at best with IR stretch   Shoulder external rotation 20 25* next to body PROM setaed (very guarded), 30* AAROM seated  30* PROM arm next to body  30* PROM arm next to body      30* PROM/AAROM supine arm next to body but limited by anterior shoulder pain   Elbow flexion           Elbow extension           Wrist flexion           Wrist extension           Wrist ulnar deviation           Wrist radial deviation           Wrist pronation           Wrist supination           (Blank rows = not tested) *=pain/symptoms  UPPER EXTREMITY MMT: NT due to post op status  MMT Right eval Left eval Left 05/11/24  Shoulder flexion   3 UT compensations   Shoulder extension     Shoulder abduction   3 UT compensations   Shoulder adduction     Shoulder internal rotation     Shoulder external rotation     Middle trapezius     Lower trapezius     Elbow flexion     Elbow extension     Wrist flexion     Wrist extension     Wrist ulnar deviation     Wrist radial deviation     Wrist pronation     Wrist supination     Grip strength (lbs)     (Blank rows = not tested) *=pain/symptoms    PALPATION:  LUE gross edema   TODAY'S TREATMENT:  DATE:   05/11/24  Pulleys x6 minutes for w/u   ROM, MMT, goals for progress note, education on healing status given  recent surgery/benefit of rest days and tapering activity, importance of reducing inflammation in the shoulder   Supine flexion AAROM with dowel x12 IR stretch with sheet to tolerance x12 L stretch at table 10x3 seconds  Supine shoulder flexion 1# 2x10- unable to tolerate 2# today  Held off on sidelying ABD and ER due to pain patterns today  Scap retractions blue TB x15  Shoulder extensions red TB x10    05/08/24  Wall ladder for flexion x15  Pulleys x4 minutes Gentle IR stretch with towel x10 L stretch at high table 10x3 seconds for shoulder flexion Supine shoulder flexion 2# 2x10 Sidelying shoulder ABD 0# 2x12 Supine serratus punches 2# x12 Supine serratus punch + CW/CCW circles x12 each 2# Supine serratus punch 2# alphabet x1    9/19 Manual: Grade II-III inferior glides in flexion, STM deltoid, tricep Supine AAROM flexion with 3# bar 2 x 10 flat table ABCs in flexion 3 sets 1# Sidelying ER 2 x 12 1#  Sidelying abduction 3x10  Standing functional IR strech with cane 10 x 10 second holds gentle Standing row GTB 1x15, progressed to BTB 1x15  05/02/24 Pulley flexion 4 minutes Supine AAROM flexion with 2# bar 2 x 10 flat table Manual: Grade II-III inferior glides in flexion, STM deltoid, tricep Perturbations in flexion 3 x 20 Supine SA punch 3# 2 x 15 Sidelying ER 1 x 12, 1# 1 x 12 Standing  functional IR strech with cane 10 x 10 second holds gentle Standing row GTB 2x 10 Wall ladder flexion 1 x 10  04/27/24 Pulley flexion 4 minutes Manual: Grade II-III inferior glides in flexion, STM deltoid, tricep Supine AAROM flexion with 1# bar 2 x 10 flat table, 30 degree incline 1# 1 x 10 Prone shoulder extension 1# 3 x 10 Prone row 1# 3 x 10 Wall ladder flexion 1 x 10       PATIENT EDUCATION:  Education details: Patient educated on exam findings, POC, scope of PT, HEP, relevant anatomy/biomechanics, protocol/precautions. Person educated: Patient Education method:  Explanation, Demonstration, and Handouts Education comprehension: verbalized understanding, returned demonstration, verbal cues required, and tactile cues required  HOME EXERCISE PROGRAM:  Access Code: [email or web address] URL: [email or web address] Date: 04/13/2024 Prepared by: Josette Rough  Exercises - Wrist AROM Flexion Extension  - 3 x daily - 7 x weekly - 2 sets - 10 reps - Seated Elbow Flexion and Extension AROM (Mirrored)  - 3 x daily - 7 x weekly - 2 sets - 10 reps - Circular Shoulder Pendulum with Table Support (Mirrored)  - 3 x daily - 7 x weekly - 2 sets - 10 reps - Seated Scapular Retraction  - 3 x daily - 7 x weekly - 2 sets - 10 reps - Seated Shoulder Flexion Towel Slide at Table Top (Mirrored)  - 3 x daily - 7 x weekly - 10 reps - 10 second hold - Supine Shoulder Flexion AAROM with Hands Clasped  - 3 x daily - 7 x weekly - 3 sets - 10 reps - Supine Shoulder External Rotation with Dowel  - 3 x daily - 7 x weekly - 2 sets - 10 reps - Standing Isometric Shoulder Flexion with Doorway - Arm Bent  - 1 x daily - 7 x weekly - 2 sets - 10 reps - 3 seconds  hold - Standing Isometric Shoulder Abduction  with Doorway - Arm Bent  - 1 x daily - 7 x weekly - 2 sets - 10 reps - 3 seconds  hold - Standing Isometric Shoulder Extension with Doorway - Arm Bent  - 1 x daily - 7 x weekly - 2 sets - 10 reps - 3 seconds  hold - Standing Isometric Shoulder External Rotation with Doorway  - 1 x daily - 7 x weekly - 2 sets - 10 reps - 3 seconds  hold   ASSESSMENT:  CLINICAL IMPRESSION:   Arrives today doing OK, still having quite a bit of soreness in the shoulder- continued to educate about healing process and that he is barely 6 weeks out from his surgery, continued to encourage rest days instead of overdoing. Updated objectives and goals, otherwise continued with functional exercise as tolerated today. Will continue to progress him as reasonable. Had to lower weights used today due to shoulder  soreness, unable to tolerate 2# today which we had been using before- will monitor this closely.     EVAL: Patient a 83 y.o. y.o. male who was seen today for physical therapy evaluation and treatment for L reverse TSA DOS 03/27/24. Patient presents with pain limited deficits in L shoulder strength, ROM, endurance, activity tolerance, and functional mobility with ADL. Patient is having to modify and restrict ADL as indicated by outcome measure score as well as subjective information and objective measures which is affecting overall participation. Patient will benefit from skilled physical therapy in order to improve function and reduce impairment.  OBJECTIVE IMPAIRMENTS: decreased activity tolerance, decreased endurance, decreased mobility, decreased ROM, decreased strength, increased muscle spasms, impaired flexibility, impaired UE functional use, improper body mechanics, and pain  ACTIVITY LIMITATIONS:  carrying, lifting, bending, sleeping, bed mobility, bathing, dressing, reach over head, hygiene/grooming, and caring for others  PARTICIPATION LIMITATIONS:  meal prep, cleaning, laundry, driving, shopping, community activity, and yard work  PERSONAL FACTORS: Age, Time since onset of injury/illness/exacerbation, and 1-2 comorbidities: DM, HLD are also affecting patient's functional outcome.   REHAB POTENTIAL: Good  CLINICAL DECISION MAKING: Evolving/moderate complexity  EVALUATION COMPLEXITY: Moderate  GOALS: Goals reviewed with patient? Yes  SHORT TERM GOALS: Target date: 05/11/2024    Patient will be independent with HEP in order to improve functional outcomes. Baseline: Goal status: IN PROGRESS 04/06/24  2.  Patient will report at least 25% improvement in symptoms for improved quality of life. Baseline:  Goal status: IN PROGRESS 04/20/24  3.  Patient will demonstrate Elevation to 130 deg and ER to 30 deg - passive, active assisted or active  Baseline:  Goal status: MET 04/20/24  4.   Patient will be out of sling per surgeon approval for improved functional use of L shoulder. Baseline:  Goal status: MET 04/13/24   LONG TERM GOALS: Target date: 06/22/2024    Patient will report at least 75% improvement in symptoms for improved quality of life. Baseline:  Goal status: IN PROGRESS 05/11/24  2.  Patient will improve UEFS score by at least 25 points in order to indicate improved tolerance to activity. Baseline:  Goal status: IN PROGRESS 05/11/24  3.  Patient will demonstrate at least PROM/AAROM/AROM to: Elevation - 145-160; ER - 40-50 ; functional IR to L1 in L shoulder for improved ability lift overhead. Baseline:  Goal status: IN PROGRESS 05/11/24  4.  Patient will be able to return to all activities unrestricted for improved ability to perform yard work functions and participate with family.  Baseline:  Goal status:  IN PROGRESS 05/11/24  5.  Patient will demonstrate grade of 5/5 MMT grade in all tested musculature as evidence of improved strength to assist with lifting at home. Baseline:  Goal status: IN PROGRESS 05/11/24   PLAN:  PT FREQUENCY: 1-2x/week  PT DURATION: 12 weeks  PLANNED INTERVENTIONS:[id number]- PT Re-evaluation, 97110-Therapeutic exercises, 97530- Therapeutic activity, 97112- Neuromuscular re-education, 97535- Self Care, [id number]- Manual therapy, [phone]- Gait training, [phone]- Orthotic Fit/training, [phone]- Canalith repositioning, [id number]- Aquatic Therapy, [phone]- Splinting, [phone]- Wound care (first 20 sq cm), 97598- Wound care (each additional 20 sq cm)Patient/Family education, Balance training, Stair training, Taping, Dry Needling, Joint mobilization, Joint manipulation, Spinal manipulation, Spinal mobilization, Scar mobilization, and DME instructions.  PLAN FOR NEXT SESSION: progress with reverse TSA protocol,  6 weeks out as of  05/08/24. Continue ROM work but caution with intensity of strengthening, less than 5# and keep reps low to avoid acromial stress  reaction per MD.    Josette Rough, PT, DPT 05/11/24 9:27 AM

## 2024-05-15 ENCOUNTER — Encounter (HOSPITAL_BASED_OUTPATIENT_CLINIC_OR_DEPARTMENT_OTHER): Payer: Self-pay | Admitting: Physical Therapy

## 2024-05-15 ENCOUNTER — Ambulatory Visit (HOSPITAL_BASED_OUTPATIENT_CLINIC_OR_DEPARTMENT_OTHER): Admitting: Physical Therapy

## 2024-05-15 DIAGNOSIS — M6281 Muscle weakness (generalized): Secondary | ICD-10-CM

## 2024-05-15 DIAGNOSIS — M7542 Impingement syndrome of left shoulder: Secondary | ICD-10-CM | POA: Diagnosis not present

## 2024-05-15 DIAGNOSIS — M25512 Pain in left shoulder: Secondary | ICD-10-CM

## 2024-05-15 DIAGNOSIS — R29898 Other symptoms and signs involving the musculoskeletal system: Secondary | ICD-10-CM | POA: Diagnosis not present

## 2024-05-15 DIAGNOSIS — M25612 Stiffness of left shoulder, not elsewhere classified: Secondary | ICD-10-CM | POA: Diagnosis not present

## 2024-05-15 NOTE — Therapy (Signed)
 OUTPATIENT PHYSICAL THERAPY SHOULDER TREATMENT NOTE   Patient Name: Greg Rogers. MRN: [id number] DOB:Sep 30, 1940, 83 y.o., male Today's Date: 05/15/2024      END OF SESSION:  PT End of Session - 05/15/24 0927     Visit Number 11    Number of Visits 24    Date for Recertification  06/22/24    Authorization Type MCR    Progress Note Due on Visit 20    PT Start Time 0848    PT Stop Time 0927    PT Time Calculation (min) 39 min    Activity Tolerance Patient tolerated treatment well    Behavior During Therapy WFL for tasks assessed/performed              Past Medical History:  Diagnosis Date   Abnormal GGT test    With otherwise normal work up.   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)    Persistent disorder of initiating or maintaining sleep    Pes planus    Rosacea    Snoring    Past Surgical History:  Procedure Laterality Date   APPENDECTOMY  1949   at age 12   CERVICAL FUSION  03/2016   C 3&4   Cyst removed from breast  1960   FOOT SURGERY  01/2009   Left   HERNIA REPAIR  1956   at age 5 (single)   HERNIA REPAIR  1960   (double)   Left 2nd toe removed  03/2017   REVERSE SHOULDER ARTHROPLASTY Left 03/27/2024   Procedure: ARTHROPLASTY, SHOULDER, TOTAL, REVERSE;  Surgeon: Genelle Standing, MD;  Location: [facility];  Service: Orthopedics;  Laterality: Left;   SPINE SURGERY     TONSILLECTOMY AND ADENOIDECTOMY  ~ 1950   VASECTOMY  ~ 1984   Patient Active Problem List   Diagnosis Date Noted   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 03/27/2024   Leg pain 03/21/2024   Shoulder pain 12/07/2023   Change in stool 11/02/2023   Nasal lesion 06/02/2023   Occipital neuralgia 10/12/2020   Wound of right leg,  initial encounter 07/03/2019   Healthcare maintenance 03/04/2017   Foot pain 07/22/2016   Cervical stenosis of spine 01/06/2016   thyroid  nodule-benign path on bx 01/2016 01/06/2016   Radicular pain in right arm 12/22/2015   Radicular leg pain 12/22/2015   Back pain 07/06/2015   Constipation 07/06/2015   FH: CAD (coronary artery disease) 05/23/2015   Family history of ischemic heart disease and other diseases of the circulatory system 05/23/2015   Advance care planning 11/26/2014   Pain in joint, shoulder region 11/26/2014   Medicare annual wellness visit, subsequent 09/27/2012   GERD (gastroesophageal reflux disease) 07/05/2012   Irritability 03/29/2011   Advance directive on file 01/09/2011   Pain in joint, other site 01/04/2011   Arthropathy 09/02/2010   NEPHROLITHIASIS, HX OF 09/02/2010   Diabetes mellitus without complication (HCC) 09/01/2010   HLD (hyperlipidemia) 10/24/2009   SNORING 10/24/2009   FOOT PAIN, BILATERAL 10/06/2009   Pes planus 10/06/2009   Acquired flat foot 10/06/2009    PCP: Cleatus Arlyss RAMAN, MD  REFERRING PROVIDER: Genelle Standing, MD  REFERRING DIAG: [id number] (ICD-10-CM) -  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, initial encounter;  1. Left shoulder reverse shoulder arthroplasty 2. Left shoulder biceps tenodesis  THERAPY DIAG:  Acute pain of left shoulder  Muscle weakness (generalized)  Stiffness of left shoulder, not elsewhere classified  Rationale for Evaluation and Treatment: Rehabilitation  ONSET DATE: DOS 03/27/24  SUBJECTIVE:                                                                                                                                                                                      SUBJECTIVE STATEMENT:  Shoulder is feeling a lot better, not nearly as sore as it had been. Have been working rest periods into the week like you suggested.    EVAL: Patient states shoulder has been doing good. Using sling. Was  very active prior to injury/surgery with gardening/yard work Hand dominance: Right  PERTINENT HISTORY: DM, HLD, chronic shoulder pain with fall in March while exiting ski lift with RC tear  PAIN:  Are you having pain? No 0/10 now    PRECAUTIONS: Other: L reverse TSA   RED FLAGS: None   WEIGHT BEARING RESTRICTIONS: NWB LUE  FALLS:  Has patient fallen in last 6 months? Yes. Number of falls 1  PLOF: Independent  PATIENT GOALS:regain use of the arm  OBJECTIVE: (objective measures from initial evaluation unless otherwise dated)  OBSERVATION: using sling, waterproof dressing intact  PATIENT SURVEYS:  UEFS  Extreme difficulty/unable (0), Quite a bit of difficulty (1), Moderate difficulty (2), Little difficulty (3), No difficulty (4) Survey date:  03/30/24  Any of your usual work, household or school activities 0  2. Your usual hobbies, recreational/sport activities 1   3. Lifting a bag of groceries to waist level 2   4. Lifting a bag of groceries above your head 0  5. Grooming your hair 4  6. Pushing up on your hands (I.e. from bathtub or chair) 0  7. Preparing food (I.e. peeling/cutting) 1  8. Driving  3  9. Vacuuming, sweeping, or raking 4  10. Dressing  3  11. Doing up buttons 2  12. Using tools/appliances 1  13. Opening doors 0  14. Cleaning  3  15. Tying or lacing shoes 2  16. Sleeping  3  17. Laundering clothes (I.e. washing, ironing, folding) 3  18. Opening a jar 3  19. Throwing a ball 1  20. Carrying a small suitcase with your affected limb.  1  Score total:  35/80     05/08/24- UEFS 15/80 via online version, later noted questions were different than one in epic   COGNITION: Overall cognitive status: Within functional limits for tasks assessed  SENSATION: WFL  POSTURE: rounded shoulders and forward head   UPPER EXTREMITY ROM: AROM RUE WFL  Passive ROM Left eval L 04/06/24 L 04/13/24 L 04/20/24 L 04/24/24 L 04/27/24 L 9/17 L  9/19 L 9/26  Shoulder  flexion 100 PROM 110 AAROM 110* PROM, 120* AAROM supine  120-130* PROM (bit guarded today) 130* PROM and AROM  114 AROM 140 AAROM 145 AAROM 138 AAROM Improves to 149 AAROM 139 AAROM 148 AAROM improves to 153 AAROM 144* AAROM   Shoulder extension           Shoulder abduction           Shoulder adduction           Shoulder internal rotation         Mid glute. L5 at best with IR stretch   Shoulder external rotation 20 25* next to body PROM setaed (very guarded), 30* AAROM seated  30* PROM arm next to body  30* PROM arm next to body      30* PROM/AAROM supine arm next to body but limited by anterior shoulder pain   Elbow flexion           Elbow extension           Wrist flexion           Wrist extension           Wrist ulnar deviation           Wrist radial deviation           Wrist pronation           Wrist supination           (Blank rows = not tested) *=pain/symptoms  UPPER EXTREMITY MMT: NT due to post op status  MMT Right eval Left eval Left 05/11/24  Shoulder flexion   3 UT compensations   Shoulder extension     Shoulder abduction   3 UT compensations   Shoulder adduction     Shoulder internal rotation     Shoulder external rotation     Middle trapezius     Lower trapezius     Elbow flexion     Elbow extension     Wrist flexion     Wrist extension     Wrist ulnar deviation     Wrist radial deviation     Wrist pronation     Wrist supination     Grip strength (lbs)     (Blank rows = not tested) *=pain/symptoms    PALPATION:  LUE gross edema   TODAY'S TREATMENT:                                                                                                                                         DATE:    05/15/24  Pulleys x6 minutes for w/u Wall ladder flexion x12 Scap retractions  blue TB x15  Shoulder extensions red TB x15 Standing shoulder flexion to 90* 0# x10 Standing shoulder ABD to 70-80* 0# x10  Serratus punch 3# x12 supine  Serratus punch + CW/CCW  circles 3# x15 each way  supine  Serratus punch + full alphabet 3# x2 rounds supine  Wall ball x10 CW/CCW Thoracic extension stretch + shoulder flexion x10  Thoracic lateral flexion stretch x10 B Thoracic rotation stretch x10 B     05/11/24  Pulleys x6 minutes for w/u   ROM, MMT, goals for progress note, education on healing status given recent surgery/benefit of rest days and tapering activity, importance of reducing inflammation in the shoulder   Supine flexion AAROM with dowel x12 IR stretch with sheet to tolerance x12 L stretch at table 10x3 seconds  Supine shoulder flexion 1# 2x10- unable to tolerate 2# today  Held off on sidelying ABD and ER due to pain patterns today  Scap retractions blue TB x15  Shoulder extensions red TB x10    05/08/24  Wall ladder for flexion x15  Pulleys x4 minutes Gentle IR stretch with towel x10 L stretch at high table 10x3 seconds for shoulder flexion Supine shoulder flexion 2# 2x10 Sidelying shoulder ABD 0# 2x12 Supine serratus punches 2# x12 Supine serratus punch + CW/CCW circles x12 each 2# Supine serratus punch 2# alphabet x1    9/19 Manual: Grade II-III inferior glides in flexion, STM deltoid, tricep Supine AAROM flexion with 3# bar 2 x 10 flat table ABCs in flexion 3 sets 1# Sidelying ER 2 x 12 1#  Sidelying abduction 3x10  Standing functional IR strech with cane 10 x 10 second holds gentle Standing row GTB 1x15, progressed to BTB 1x15  05/02/24 Pulley flexion 4 minutes Supine AAROM flexion with 2# bar 2 x 10 flat table Manual: Grade II-III inferior glides in flexion, STM deltoid, tricep Perturbations in flexion 3 x 20 Supine SA punch 3# 2 x 15 Sidelying ER 1 x 12, 1# 1 x 12 Standing  functional IR strech with cane 10 x 10 second holds gentle Standing row GTB 2x 10 Wall ladder flexion 1 x 10  04/27/24 Pulley flexion 4 minutes Manual: Grade II-III inferior glides in flexion, STM deltoid, tricep Supine AAROM flexion with  1# bar 2 x 10 flat table, 30 degree incline 1# 1 x 10 Prone shoulder extension 1# 3 x 10 Prone row 1# 3 x 10 Wall ladder flexion 1 x 10       PATIENT EDUCATION:  Education details: Patient educated on exam findings, POC, scope of PT, HEP, relevant anatomy/biomechanics, protocol/precautions. Person educated: Patient Education method: Explanation, Demonstration, and Handouts Education comprehension: verbalized understanding, returned demonstration, verbal cues required, and tactile cues required  HOME EXERCISE PROGRAM:  Access Code: [email or web address] URL: [email or web address] Date: 04/13/2024 Prepared by: Josette Rough  Exercises - Wrist AROM Flexion Extension  - 3 x daily - 7 x weekly - 2 sets - 10 reps - Seated Elbow Flexion and Extension AROM (Mirrored)  - 3 x daily - 7 x weekly - 2 sets - 10 reps - Circular Shoulder Pendulum with Table Support (Mirrored)  - 3 x daily - 7 x weekly - 2 sets - 10 reps - Seated Scapular Retraction  - 3 x daily - 7 x weekly - 2 sets - 10 reps - Seated Shoulder Flexion Towel Slide at Table Top (Mirrored)  - 3 x daily - 7 x weekly - 10 reps - 10 second hold - Supine Shoulder Flexion  AAROM with Hands Clasped  - 3 x daily - 7 x weekly - 3 sets - 10 reps - Supine Shoulder External Rotation with Dowel  - 3 x daily - 7 x weekly - 2 sets - 10 reps - Standing Isometric Shoulder Flexion with Doorway - Arm Bent  - 1 x daily - 7 x weekly - 2 sets - 10 reps - 3 seconds  hold - Standing Isometric Shoulder Abduction with Doorway - Arm Bent  - 1 x daily - 7 x weekly - 2 sets - 10 reps - 3 seconds  hold - Standing Isometric Shoulder Extension with Doorway - Arm Bent  - 1 x daily - 7 x weekly - 2 sets - 10 reps - 3 seconds  hold - Standing Isometric Shoulder External Rotation with Doorway  - 1 x daily - 7 x weekly - 2 sets - 10 reps - 3 seconds  hold   ASSESSMENT:  CLINICAL IMPRESSION:   Arrives today doing much better, has been compliant with rest at home  and shoulder soreness has improved significantly. Continued with heavier focus on ROM but tried incorporating  more strengthening as tolerated without increasing soreness this visit. Still has a pretty significant shoulder hike when working against gravity, progressed into these exercises today but in limited range to help avoid compensation patterns. Will continue to progress strength cautiously as per MD guidance.     EVAL: Patient a 83 y.o. y.o. male who was seen today for physical therapy evaluation and treatment for L reverse TSA DOS 03/27/24. Patient presents with pain limited deficits in L shoulder strength, ROM, endurance, activity tolerance, and functional mobility with ADL. Patient is having to modify and restrict ADL as indicated by outcome measure score as well as subjective information and objective measures which is affecting overall participation. Patient will benefit from skilled physical therapy in order to improve function and reduce impairment.  OBJECTIVE IMPAIRMENTS: decreased activity tolerance, decreased endurance, decreased mobility, decreased ROM, decreased strength, increased muscle spasms, impaired flexibility, impaired UE functional use, improper body mechanics, and pain  ACTIVITY LIMITATIONS:  carrying, lifting, bending, sleeping, bed mobility, bathing, dressing, reach over head, hygiene/grooming, and caring for others  PARTICIPATION LIMITATIONS:  meal prep, cleaning, laundry, driving, shopping, community activity, and yard work  PERSONAL FACTORS: Age, Time since onset of injury/illness/exacerbation, and 1-2 comorbidities: DM, HLD are also affecting patient's functional outcome.   REHAB POTENTIAL: Good  CLINICAL DECISION MAKING: Evolving/moderate complexity  EVALUATION COMPLEXITY: Moderate  GOALS: Goals reviewed with patient? Yes  SHORT TERM GOALS: Target date: 05/11/2024    Patient will be independent with HEP in order to improve functional  outcomes. Baseline: Goal status: IN PROGRESS 04/06/24  2.  Patient will report at least 25% improvement in symptoms for improved quality of life. Baseline:  Goal status: IN PROGRESS 04/20/24  3.  Patient will demonstrate Elevation to 130 deg and ER to 30 deg - passive, active assisted or active  Baseline:  Goal status: MET 04/20/24  4.  Patient will be out of sling per surgeon approval for improved functional use of L shoulder. Baseline:  Goal status: MET 04/13/24   LONG TERM GOALS: Target date: 06/22/2024    Patient will report at least 75% improvement in symptoms for improved quality of life. Baseline:  Goal status: IN PROGRESS 05/11/24  2.  Patient will improve UEFS score by at least 25 points in order to indicate improved tolerance to activity. Baseline:  Goal status: IN PROGRESS 05/11/24  3.  Patient will demonstrate at least PROM/AAROM/AROM to: Elevation - 145-160; ER - 40-50 ; functional IR to L1 in L shoulder for improved ability lift overhead. Baseline:  Goal status: IN PROGRESS 05/11/24  4.  Patient will be able to return to all activities unrestricted for improved ability to perform yard work functions and participate with family.  Baseline:  Goal status: IN PROGRESS 05/11/24  5.  Patient will demonstrate grade of 5/5 MMT grade in all tested musculature as evidence of improved strength to assist with lifting at home. Baseline:  Goal status: IN PROGRESS 05/11/24   PLAN:  PT FREQUENCY: 1-2x/week  PT DURATION: 12 weeks  PLANNED INTERVENTIONS:[id number]- PT Re-evaluation, 97110-Therapeutic exercises, 97530- Therapeutic activity, 97112- Neuromuscular re-education, 97535- Self Care, [id number]- Manual therapy, [phone]- Gait training, [phone]- Orthotic Fit/training, [phone]- Canalith repositioning, [id number]- Aquatic Therapy, [phone]- Splinting, [phone]- Wound care (first 20 sq cm), 97598- Wound care (each additional 20 sq cm)Patient/Family education, Balance training, Stair training, Taping, Dry  Needling, Joint mobilization, Joint manipulation, Spinal manipulation, Spinal mobilization, Scar mobilization, and DME instructions.  PLAN FOR NEXT SESSION: progress with reverse TSA protocol,  7 weeks out as of  05/15/24. Continue ROM work but caution with intensity of strengthening, less than 5# and keep reps low to avoid acromial stress reaction per MD.    Josette Rough, PT, DPT 05/15/24 9:27 AM

## 2024-05-18 ENCOUNTER — Encounter (HOSPITAL_BASED_OUTPATIENT_CLINIC_OR_DEPARTMENT_OTHER): Payer: Self-pay | Admitting: Physical Therapy

## 2024-05-18 ENCOUNTER — Ambulatory Visit (HOSPITAL_BASED_OUTPATIENT_CLINIC_OR_DEPARTMENT_OTHER): Attending: Orthopaedic Surgery | Admitting: Physical Therapy

## 2024-05-18 DIAGNOSIS — R29898 Other symptoms and signs involving the musculoskeletal system: Secondary | ICD-10-CM | POA: Diagnosis not present

## 2024-05-18 DIAGNOSIS — M25512 Pain in left shoulder: Secondary | ICD-10-CM | POA: Diagnosis not present

## 2024-05-18 DIAGNOSIS — M6281 Muscle weakness (generalized): Secondary | ICD-10-CM | POA: Diagnosis not present

## 2024-05-18 DIAGNOSIS — M25612 Stiffness of left shoulder, not elsewhere classified: Secondary | ICD-10-CM | POA: Insufficient documentation

## 2024-05-18 DIAGNOSIS — M7542 Impingement syndrome of left shoulder: Secondary | ICD-10-CM | POA: Insufficient documentation

## 2024-05-18 NOTE — Therapy (Addendum)
 OUTPATIENT PHYSICAL THERAPY SHOULDER TREATMENT NOTE   Patient Name: Greg Rogers. MRN: [id number] DOB:February 23, 1941, 83 y.o., male Today's Date: 05/18/2024  END OF SESSION:  PT End of Session - 05/18/24 0845     Visit Number 12    Number of Visits 24    Date for Recertification  06/22/24    Authorization Type MCR    Progress Note Due on Visit 20    PT Start Time 0847    PT Stop Time 0928    PT Time Calculation (min) 41 min    Activity Tolerance Patient tolerated treatment well    Behavior During Therapy [facility] for tasks assessed/performed              Past Medical History:  Diagnosis Date   Abnormal GGT test    With otherwise normal work up.   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)    Persistent disorder of initiating or maintaining sleep    Pes planus    Rosacea    Snoring    Past Surgical History:  Procedure Laterality Date   APPENDECTOMY  1949   at age 16   CERVICAL FUSION  03/2016   C 3&4   Cyst removed from breast  1960   FOOT SURGERY  01/2009   Left   HERNIA REPAIR  1956   at age 51 (single)   HERNIA REPAIR  1960   (double)   Left 2nd toe removed  03/2017   REVERSE SHOULDER ARTHROPLASTY Left 03/27/2024   Procedure: ARTHROPLASTY, SHOULDER, TOTAL, REVERSE;  Surgeon: Genelle Standing, MD;  Location: [facility];  Service: Orthopedics;  Laterality: Left;   SPINE SURGERY     TONSILLECTOMY AND ADENOIDECTOMY  ~ 1950   VASECTOMY  ~ 1984   Patient Active Problem List   Diagnosis Date Noted   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 03/27/2024   Leg pain 03/21/2024   Shoulder pain 12/07/2023   Change in stool 11/02/2023   Nasal lesion 06/02/2023   Occipital neuralgia 10/12/2020   Wound of right leg, initial  encounter 07/03/2019   Healthcare maintenance 03/04/2017   Foot pain 07/22/2016   Cervical stenosis of spine 01/06/2016   thyroid  nodule-benign path on bx 01/2016 01/06/2016   Radicular pain in right arm 12/22/2015   Radicular leg pain 12/22/2015   Back pain 07/06/2015   Constipation 07/06/2015   FH: CAD (coronary artery disease) 05/23/2015   Family history of ischemic heart disease and other diseases of the circulatory system 05/23/2015   Advance care planning 11/26/2014   Pain in joint, shoulder region 11/26/2014   Medicare annual wellness visit, subsequent 09/27/2012   GERD (gastroesophageal reflux disease) 07/05/2012   Irritability 03/29/2011   Advance directive on file 01/09/2011   Pain in joint, other site 01/04/2011   Arthropathy 09/02/2010   NEPHROLITHIASIS, HX OF 09/02/2010   Diabetes mellitus without complication (HCC) 09/01/2010   HLD (hyperlipidemia) 10/24/2009   SNORING 10/24/2009   FOOT PAIN, BILATERAL 10/06/2009   Pes planus 10/06/2009   Acquired flat foot 10/06/2009    PCP: Cleatus Arlyss RAMAN, MD  REFERRING PROVIDER: Genelle Standing, MD  REFERRING DIAG: [id number] (ICD-10-CM) - Traumatic complete tear  of left rotator cuff, initial encounter;  1. Left shoulder reverse shoulder arthroplasty 2. Left shoulder biceps tenodesis  THERAPY DIAG:  Acute pain of left shoulder  Muscle weakness (generalized)  Stiffness of left shoulder, not elsewhere classified  Impingement syndrome of left shoulder  Other symptoms and signs involving the musculoskeletal system  Rationale for Evaluation and Treatment: Rehabilitation  ONSET DATE: DOS 03/27/24  SUBJECTIVE:                                                                                                                                                                                      SUBJECTIVE STATEMENT: States he is feeling tender today because he bumped into a door yesterday. States he pushed it hard yesterday with  bands and has been trying to do more with right shoulder against wall with weight.   EVAL: Patient states shoulder has been doing good. Using sling. Was very active prior to injury/surgery with gardening/yard work Hand dominance: Right  PERTINENT HISTORY: DM, HLD, chronic shoulder pain with fall in March while exiting ski lift with RC tear  PAIN:  Are you having pain? No 0/10 now    PRECAUTIONS: Other: L reverse TSA   RED FLAGS: None   WEIGHT BEARING RESTRICTIONS: NWB LUE  FALLS:  Has patient fallen in last 6 months? Yes. Number of falls 1  PLOF: Independent  PATIENT GOALS:regain use of the arm  OBJECTIVE: (objective measures from initial evaluation unless otherwise dated)  OBSERVATION: using sling, waterproof dressing intact  PATIENT SURVEYS:  UEFS  Extreme difficulty/unable (0), Quite a bit of difficulty (1), Moderate difficulty (2), Little difficulty (3), No difficulty (4) Survey date:  03/30/24  Any of your usual work, household or school activities 0  2. Your usual hobbies, recreational/sport activities 1   3. Lifting a bag of groceries to waist level 2   4. Lifting a bag of groceries above your head 0  5. Grooming your hair 4  6. Pushing up on your hands (I.e. from bathtub or chair) 0  7. Preparing food (I.e. peeling/cutting) 1  8. Driving  3  9. Vacuuming, sweeping, or raking 4  10. Dressing  3  11. Doing up buttons 2  12. Using tools/appliances 1  13. Opening doors 0  14. Cleaning  3  15. Tying or lacing shoes 2  16. Sleeping  3  17. Laundering clothes (I.e. washing, ironing, folding) 3  18. Opening a jar 3  19. Throwing a ball 1  20. Carrying a small suitcase with your affected limb.  1  Score total:  35/80     05/08/24- UEFS 15/80 via online version, later noted questions  were different than one in epic   COGNITION: Overall cognitive status: Within functional limits for tasks assessed     SENSATION: WFL  POSTURE: rounded shoulders and forward  head   UPPER EXTREMITY ROM: AROM RUE WFL  Passive ROM Left eval L 04/06/24 L 04/13/24 L 04/20/24 L 04/24/24 L 04/27/24 L 9/17 L  9/19 L 9/26  Shoulder flexion 100 PROM 110 AAROM 110* PROM, 120* AAROM supine  120-130* PROM (bit guarded today) 130* PROM and AROM  114 AROM 140 AAROM 145 AAROM 138 AAROM Improves to 149 AAROM 139 AAROM 148 AAROM improves to 153 AAROM 144* AAROM   Shoulder extension           Shoulder abduction           Shoulder adduction           Shoulder internal rotation         Mid glute. L5 at best with IR stretch   Shoulder external rotation 20 25* next to body PROM setaed (very guarded), 30* AAROM seated  30* PROM arm next to body  30* PROM arm next to body      30* PROM/AAROM supine arm next to body but limited by anterior shoulder pain   Elbow flexion           Elbow extension           Wrist flexion           Wrist extension           Wrist ulnar deviation           Wrist radial deviation           Wrist pronation           Wrist supination           (Blank rows = not tested) *=pain/symptoms  UPPER EXTREMITY MMT: NT due to post op status  MMT Right eval Left eval Left 05/11/24  Shoulder flexion   3 UT compensations   Shoulder extension     Shoulder abduction   3 UT compensations   Shoulder adduction     Shoulder internal rotation     Shoulder external rotation     Middle trapezius     Lower trapezius     Elbow flexion     Elbow extension     Wrist flexion     Wrist extension     Wrist ulnar deviation     Wrist radial deviation     Wrist pronation     Wrist supination     Grip strength (lbs)     (Blank rows = not tested) *=pain/symptoms    PALPATION:  LUE gross edema   TODAY'S TREATMENT:  DATE:  05/18/24 STM and trigger point release to L upper trap, deltoid, and bicep. Grade II-III posterior and inferior  mobilizations at shoulder  Pulleys x6 min  Serratus punch + full alphabet 3# x2 rounds supine Standing shoulder abduction 2x10 within range without shrugging shoulder  Shoulder flexion with GTB around hands for RC stability 2x10   05/15/24 Pulleys x6 minutes for w/u Wall ladder flexion x12 Scap retractions blue TB x15  Shoulder extensions red TB x15 Standing shoulder flexion to 90* 0# x10 Standing shoulder ABD to 70-80* 0# x10  Serratus punch 3# x12 supine  Serratus punch + CW/CCW circles 3# x15 each way  supine  Serratus punch + full alphabet 3# x2 rounds supine  Wall ball x10 CW/CCW Thoracic extension stretch + shoulder flexion x10  Thoracic lateral flexion stretch x10 B Thoracic rotation stretch x10 B  05/11/24  Pulleys x6 minutes for w/u   ROM, MMT, goals for progress note, education on healing status given recent surgery/benefit of rest days and tapering activity, importance of reducing inflammation in the shoulder   Supine flexion AAROM with dowel x12 IR stretch with sheet to tolerance x12 L stretch at table 10x3 seconds  Supine shoulder flexion 1# 2x10- unable to tolerate 2# today  Held off on sidelying ABD and ER due to pain patterns today  Scap retractions blue TB x15  Shoulder extensions red TB x10    05/08/24 Wall ladder for flexion x15  Pulleys x4 minutes Gentle IR stretch with towel x10 L stretch at high table 10x3 seconds for shoulder flexion Supine shoulder flexion 2# 2x10 Sidelying shoulder ABD 0# 2x12 Supine serratus punches 2# x12 Supine serratus punch + CW/CCW circles x12 each 2# Supine serratus punch 2# alphabet x1  9/19 Manual: Grade II-III inferior glides in flexion, STM deltoid, tricep Supine AAROM flexion with 3# bar 2 x 10 flat table ABCs in flexion 3 sets 1# Sidelying ER 2 x 12 1#  Sidelying abduction 3x10  Standing functional IR strech with cane 10 x 10 second holds gentle Standing row GTB 1x15, progressed to BTB  1x15  05/02/24 Pulley flexion 4 minutes Supine AAROM flexion with 2# bar 2 x 10 flat table Manual: Grade II-III inferior glides in flexion, STM deltoid, tricep Perturbations in flexion 3 x 20 Supine SA punch 3# 2 x 15 Sidelying ER 1 x 12, 1# 1 x 12 Standing  functional IR strech with cane 10 x 10 second holds gentle Standing row GTB 2x 10 Wall ladder flexion 1 x 10  04/27/24 Pulley flexion 4 minutes Manual: Grade II-III inferior glides in flexion, STM deltoid, tricep Supine AAROM flexion with 1# bar 2 x 10 flat table, 30 degree incline 1# 1 x 10 Prone shoulder extension 1# 3 x 10 Prone row 1# 3 x 10 Wall ladder flexion 1 x 10  PATIENT EDUCATION:  Education details: Patient educated on exam findings, POC, scope of PT, HEP, relevant anatomy/biomechanics, protocol/precautions. Person educated: Patient Education method: Explanation, Demonstration, and Handouts Education comprehension: verbalized understanding, returned demonstration, verbal cues required, and tactile cues required  HOME EXERCISE PROGRAM:  Access Code: [email or web address] URL: [email or web address] Date: 04/13/2024 Prepared by: Josette Rough  Exercises - Wrist AROM Flexion Extension  - 3 x daily - 7 x weekly - 2 sets - 10 reps - Seated Elbow Flexion and Extension AROM (Mirrored)  - 3 x daily - 7 x weekly - 2 sets - 10 reps - Circular Shoulder Pendulum with Table Support (Mirrored)  - 3 x  daily - 7 x weekly - 2 sets - 10 reps - Seated Scapular Retraction  - 3 x daily - 7 x weekly - 2 sets - 10 reps - Seated Shoulder Flexion Towel Slide at Table Top (Mirrored)  - 3 x daily - 7 x weekly - 10 reps - 10 second hold - Supine Shoulder Flexion AAROM with Hands Clasped  - 3 x daily - 7 x weekly - 3 sets - 10 reps - Supine Shoulder External Rotation with Dowel  - 3 x daily - 7 x weekly - 2 sets - 10 reps - Standing Isometric Shoulder Flexion with Doorway - Arm Bent  - 1 x daily - 7 x weekly - 2 sets - 10 reps - 3 seconds   hold - Standing Isometric Shoulder Abduction with Doorway - Arm Bent  - 1 x daily - 7 x weekly - 2 sets - 10 reps - 3 seconds  hold - Standing Isometric Shoulder Extension with Doorway - Arm Bent  - 1 x daily - 7 x weekly - 2 sets - 10 reps - 3 seconds  hold - Standing Isometric Shoulder External Rotation with Doorway  - 1 x daily - 7 x weekly - 2 sets - 10 reps - 3 seconds  hold   ASSESSMENT:  CLINICAL IMPRESSION: Patient tolerated treatment well today but was limited due to increased stiffness to L shoulder. Manual therapy utilized yo aid in decreasing hyperactive shoulder musculature. Exercises focused on activating RC musculature and decreasing compensation with anterior deltoid. Pt remains limited in strength, ROM, and activity tolerance, and will continue to benefit from therapy to address remaining limitations.   EVAL: Patient a 83 y.o. y.o. male who was seen today for physical therapy evaluation and treatment for L reverse TSA DOS 03/27/24. Patient presents with pain limited deficits in L shoulder strength, ROM, endurance, activity tolerance, and functional mobility with ADL. Patient is having to modify and restrict ADL as indicated by outcome measure score as well as subjective information and objective measures which is affecting overall participation. Patient will benefit from skilled physical therapy in order to improve function and reduce impairment.  OBJECTIVE IMPAIRMENTS: decreased activity tolerance, decreased endurance, decreased mobility, decreased ROM, decreased strength, increased muscle spasms, impaired flexibility, impaired UE functional use, improper body mechanics, and pain  ACTIVITY LIMITATIONS:  carrying, lifting, bending, sleeping, bed mobility, bathing, dressing, reach over head, hygiene/grooming, and caring for others  PARTICIPATION LIMITATIONS:  meal prep, cleaning, laundry, driving, shopping, community activity, and yard work  PERSONAL FACTORS: Age, Time since onset  of injury/illness/exacerbation, and 1-2 comorbidities: DM, HLD are also affecting patient's functional outcome.   REHAB POTENTIAL: Good  CLINICAL DECISION MAKING: Evolving/moderate complexity  EVALUATION COMPLEXITY: Moderate  GOALS: Goals reviewed with patient? Yes  SHORT TERM GOALS: Target date: 05/11/2024    Patient will be independent with HEP in order to improve functional outcomes. Baseline: Goal status: IN PROGRESS 04/06/24  2.  Patient will report at least 25% improvement in symptoms for improved quality of life. Baseline:  Goal status: IN PROGRESS 04/20/24  3.  Patient will demonstrate Elevation to 130 deg and ER to 30 deg - passive, active assisted or active  Baseline:  Goal status: MET 04/20/24  4.  Patient will be out of sling per surgeon approval for improved functional use of L shoulder. Baseline:  Goal status: MET 04/13/24   LONG TERM GOALS: Target date: 06/22/2024    Patient will report at least 75% improvement in symptoms  for improved quality of life. Baseline:  Goal status: IN PROGRESS 05/11/24  2.  Patient will improve UEFS score by at least 25 points in order to indicate improved tolerance to activity. Baseline:  Goal status: IN PROGRESS 05/11/24  3.  Patient will demonstrate at least PROM/AAROM/AROM to: Elevation - 145-160; ER - 40-50 ; functional IR to L1 in L shoulder for improved ability lift overhead. Baseline:  Goal status: IN PROGRESS 05/11/24  4.  Patient will be able to return to all activities unrestricted for improved ability to perform yard work functions and participate with family.  Baseline:  Goal status: IN PROGRESS 05/11/24  5.  Patient will demonstrate grade of 5/5 MMT grade in all tested musculature as evidence of improved strength to assist with lifting at home. Baseline:  Goal status: IN PROGRESS 05/11/24   PLAN:  PT FREQUENCY: 1-2x/week  PT DURATION: 12 weeks  PLANNED INTERVENTIONS:[id number]- PT Re-evaluation, 97110-Therapeutic  exercises, 97530- Therapeutic activity, 97112- Neuromuscular re-education, 97535- Self Care, [id number]- Manual therapy, [phone]- Gait training, [phone]- Orthotic Fit/training, [phone]- Canalith repositioning, [id number]- Aquatic Therapy, [phone]- Splinting, [phone]- Wound care (first 20 sq cm), 97598- Wound care (each additional 20 sq cm)Patient/Family education, Balance training, Stair training, Taping, Dry Needling, Joint mobilization, Joint manipulation, Spinal manipulation, Spinal mobilization, Scar mobilization, and DME instructions.  PLAN FOR NEXT SESSION: progress with reverse TSA protocol,  Continue ROM work but caution with intensity of strengthening, less than 5# and keep reps low to avoid acromial stress reaction per MD.   Lili Finder, Student-PT 05/18/2024, 9:31 AM   This entire session was performed under direct supervision and direction of a licensed therapist/therapist assistant . I have personally read, edited and approve of the note as written. 9:47 AM, 05/18/24 Prentice CANDIE Stains PT, DPT Physical Therapist at [facility]

## 2024-05-20 ENCOUNTER — Encounter (HOSPITAL_BASED_OUTPATIENT_CLINIC_OR_DEPARTMENT_OTHER): Payer: Self-pay | Admitting: Orthopaedic Surgery

## 2024-05-21 NOTE — Telephone Encounter (Signed)
 Patient scheduled with Greg Rogers

## 2024-05-22 ENCOUNTER — Ambulatory Visit (HOSPITAL_BASED_OUTPATIENT_CLINIC_OR_DEPARTMENT_OTHER): Admitting: Physical Therapy

## 2024-05-22 ENCOUNTER — Ambulatory Visit (INDEPENDENT_AMBULATORY_CARE_PROVIDER_SITE_OTHER): Admitting: Student

## 2024-05-22 ENCOUNTER — Ambulatory Visit (INDEPENDENT_AMBULATORY_CARE_PROVIDER_SITE_OTHER)

## 2024-05-22 ENCOUNTER — Encounter (HOSPITAL_BASED_OUTPATIENT_CLINIC_OR_DEPARTMENT_OTHER): Payer: Self-pay | Admitting: Physical Therapy

## 2024-05-22 DIAGNOSIS — G8929 Other chronic pain: Secondary | ICD-10-CM

## 2024-05-22 DIAGNOSIS — M25612 Stiffness of left shoulder, not elsewhere classified: Secondary | ICD-10-CM | POA: Diagnosis not present

## 2024-05-22 DIAGNOSIS — R29898 Other symptoms and signs involving the musculoskeletal system: Secondary | ICD-10-CM | POA: Diagnosis not present

## 2024-05-22 DIAGNOSIS — M25512 Pain in left shoulder: Secondary | ICD-10-CM | POA: Diagnosis not present

## 2024-05-22 DIAGNOSIS — M7542 Impingement syndrome of left shoulder: Secondary | ICD-10-CM

## 2024-05-22 DIAGNOSIS — Z96612 Presence of left artificial shoulder joint: Secondary | ICD-10-CM | POA: Diagnosis not present

## 2024-05-22 DIAGNOSIS — M6281 Muscle weakness (generalized): Secondary | ICD-10-CM | POA: Diagnosis not present

## 2024-05-22 DIAGNOSIS — Z471 Aftercare following joint replacement surgery: Secondary | ICD-10-CM | POA: Diagnosis not present

## 2024-05-22 NOTE — Progress Notes (Signed)
 Post Operative Evaluation    Procedure/Date of Surgery: Left shoulder reverse shoulder arthroplasty 8/12  Interval History:   Patient presents today 8 weeks status post left reverse shoulder arthroplasty.  5 days ago he reports that he stumbled and ran into a door frame with his operative shoulder.  He does have some increased pain levels over the lateral shoulder that he describes as tightness and his shoulder has been more uncomfortable particularly in the first few degrees of flexion.   PMH/PSH/Family History/Social History/Meds/Allergies:    Past Medical History:  Diagnosis Date   Abnormal GGT test    With otherwise normal work up.   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)    Persistent disorder of initiating or maintaining sleep    Pes planus    Rosacea    Snoring    Past Surgical History:  Procedure Laterality Date   APPENDECTOMY  1949   at age 78   CERVICAL FUSION  03/2016   C 3&4   Cyst removed from breast  1960   FOOT SURGERY  01/2009   Left   HERNIA REPAIR  1956   at age 7 (single)   HERNIA REPAIR  1960   (double)   Left 2nd toe removed  03/2017   REVERSE SHOULDER ARTHROPLASTY Left 03/27/2024   Procedure: ARTHROPLASTY, SHOULDER, TOTAL, REVERSE;  Surgeon: Genelle Standing, MD;  Location: [facility];  Service: Orthopedics;  Laterality: Left;   SPINE SURGERY     TONSILLECTOMY AND ADENOIDECTOMY  ~ 1950   VASECTOMY  ~ 1984   Social History   Socioeconomic History   Marital status: Married    Spouse name: Not on file   Number of children: 4   Years of education: Not on file   Highest education level: Master's degree (e.g., MA, MS, MEng, MEd, MSW, MBA)  Occupational History   Occupation:  Retired Nov. 2007 27-1/2 years of service    Employer: RETIRED    Comment: Paediatric nurse   Occupation: Works part time at a desk job.  Tobacco Use   Smoking status: Never   Smokeless tobacco: Former    Types: Engineer, drilling   Vaping status: Never Used  Substance and Sexual Activity   Alcohol use: Yes    Comment: 1-2 glass wine daily   Drug use: No   Sexual activity: Not Currently  Other Topics Concern   Not on file  Social History Narrative   Education:  Masters   Charity fundraiser at 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   4 kids, 2 are MD's   7 grandkids   [employer] and stays active, enjoys skiing, very active, walks at least 1/2 mile daily   Social Drivers of Corporate investment banker Strain: Low Risk  (03/19/2024)   Overall Financial Resource Strain (CARDIA)    Difficulty of Paying Living Expenses: Not very hard  Food Insecurity: No Food Insecurity (03/19/2024)   Hunger Vital Sign    Worried  About Running Out of Food in the Last Year: Never true    Ran Out of Food in the Last Year: Never true  Transportation Needs: No Transportation Needs (03/19/2024)   PRAPARE - Administrator, Civil Service (Medical): No    Lack of Transportation (Non-Medical): No  Physical Activity: Sufficiently Active (03/19/2024)   Exercise Vital Sign    Days of Exercise per Week: 7 days    Minutes of Exercise per Session: 30 min  Stress: No Stress Concern Present (03/19/2024)   [employer] of Occupational Health - Occupational Stress Questionnaire    Feeling of Stress: Not at all  Social Connections: Moderately Isolated (03/19/2024)   Social Connection and Isolation Panel    Frequency of Communication with Friends and Family: Twice a week    Frequency of Social Gatherings with Friends and Family: Once a week    Attends Religious Services: Never    Database administrator or Organizations: No    Attends Engineer, structural: Not on file     Marital Status: Married   Family History  Problem Relation Age of Onset   Heart disease Father 15       First MI   Hyperlipidemia Father    Heart attack Father    Diabetes Brother    Hyperlipidemia Brother    Hypertension Brother    Heart disease Brother        CAD s/p stent   CAD Brother    Heart disease Brother    Heart disease Brother    Diabetes Sister        DM2   Hyperlipidemia Sister    Heart disease Sister    Arthritis Other    Diabetes Other    ADD / ADHD Son    Depression Sister    Prostate cancer Neg Hx    Colon cancer Neg Hx    Allergies  Allergen Reactions   Azo [Phenazopyridine] Other (See Comments)    diarrhea   Bee Venom    Erythromycin     Oral ulcers (but tolerates zithromax)    Animator (Solenopsis Invicta) Swelling   Oxycodone      Intolerant- but can tolerate hydrocodone .    Pneumococcal Vaccine Polyvalent     REACTION: rash   Wasp Venom     Exaggerated local reaction   Current Outpatient Medications  Medication Sig Dispense Refill   Accu-Chek FastClix Lancets MISC USE AS NEEDED TO CHECK SUGAR DAILY. 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 102 each 3   aspirin  81 MG EC tablet Take 81 mg by mouth daily.     aspirin  EC 325 MG tablet Take 1 tablet (325 mg total) by mouth daily. (Patient not taking: Reported on 03/20/2024) 14 tablet 0   B Complex-C-Folic Acid (SUPER B COMPLEX/FA/VIT C) TABS      Blood Glucose Monitoring Suppl (ONETOUCH VERIO) w/Device KIT Use to check blood sugar up to 3 times a day. Dx: E11.9 1 kit 0   EPINEPHrine  0.3 mg/0.3 mL IJ SOAJ injection Inject 0.3 mLs (0.3 mg total) into the muscle once. (Patient not taking: Reported on 03/20/2024) 2 Device 1   fluorouracil  (EFUDEX ) 5 % cream Apply topically 2 (two) times daily.     glimepiride  (AMARYL ) 2 MG tablet TAKE 1 TABLET BY MOUTH DAILY  WITH BREAKFAST 90 tablet 3   HYDROcodone -acetaminophen  (NORCO/VICODIN) 5-325 MG tablet Take 1 tablet by mouth every 6 (six) hours as needed for  moderate pain (pain score 4-6).  20 tablet 0   insulin  glargine (LANTUS  SOLOSTAR) 100 UNIT/ML Solostar Pen Inject 7 Units into the skin daily.     Insulin  Pen Needle 30G X 5 MM MISC Use daily with insulin  pen. 100 each 3   metFORMIN  (GLUCOPHAGE ) 1000 MG tablet Take 1 tablet (1,000 mg total) by mouth daily with breakfast. 180 tablet 3   metFORMIN  (GLUCOPHAGE -XR) 500 MG 24 hr tablet Take 2 tablets (1,000 mg total) by mouth at bedtime. 180 tablet 1   nitroGLYCERIN  (NITROSTAT ) 0.4 MG SL tablet Place 1 tablet (0.4 mg total) under the tongue every 5 (five) minutes as needed for chest pain (max 3 doses/15 min.call MD/to ER if used). 25 tablet 3   ONETOUCH VERIO test strip USE TO CHECK BLOOD SUGAR UP TO 3 TIMES DAILY. DX E11.9 100 strip 25   rosuvastatin  (CRESTOR ) 10 MG tablet Take 1 tablet (10 mg total) by mouth daily. 90 tablet 3   sertraline  (ZOLOFT ) 50 MG tablet Take 1 tablet (50 mg total) by mouth daily. 90 tablet 3   sitaGLIPtin  (JANUVIA ) 100 MG tablet Take 1 tablet (100 mg total) by mouth daily. 90 tablet 3   No current facility-administered medications for this visit.   No results found.  Review of Systems:   A ROS was performed including pertinent positives and negatives as documented in the HPI.   Musculoskeletal Exam:    There were no vitals taken for this visit.  Left shoulder incision is well-appearing without erythema or drainage.  He does demonstrate some mild tenderness over the lateral deltoid.  Active forward elevation is to 120 degrees with some particular discomfort noted in the first 30 degrees.  Painless shoulder abduction to 90 degrees.  Imaging:   Xray (left shoulder 3 view): Reverse shoulder arthroplasty hardware in good position without evidence of damage or acute bony injury   I personally reviewed and interpreted the radiographs.   Assessment:   8 weeks status post left reverse shoulder arthroplasty.  He did sustain an impact directly to this left shoulder about 5  days ago after losing his balance and hitting a door frame.  X-rays today are reassuring as there are no complications to the hardware.  He saw physical therapy this morning that worked more on muscle stiffness which has helped some.  I did reassure him that everything today looks good from our standpoint and he can continue with rehabilitation but I will expect him to continue to improve and benefit from therapy.  We can plan to see him back in clinic for next scheduled postop visit unless needed sooner.  Plan :    - Continue PT and return to clinic on 11/12 as scheduled      I personally saw and evaluated the patient, and participated in the management and treatment plan.   Leonce Reveal, PA-C Orthopedics  This document was dictated using Conservation officer, historic buildings. A reasonable attempt at proof reading has been made to minimize errors.

## 2024-05-22 NOTE — Therapy (Addendum)
 OUTPATIENT PHYSICAL THERAPY SHOULDER TREATMENT NOTE   Patient Name: Greg Rogers. MRN: [id number] DOB:January 31, 1941, 83 y.o., male Today's Date: 05/22/2024  END OF SESSION:  PT End of Session - 05/22/24 0845     Visit Number 13    Number of Visits 24    Date for Recertification  06/22/24    Authorization Type MCR    Progress Note Due on Visit 20    PT Start Time 0847    PT Stop Time 0927    PT Time Calculation (min) 40 min    Activity Tolerance Patient tolerated treatment well    Behavior During Therapy [facility] for tasks assessed/performed         Past Medical History:  Diagnosis Date   Abnormal GGT test    With otherwise normal work up.   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)    Persistent disorder of initiating or maintaining sleep    Pes planus    Rosacea    Snoring    Past Surgical History:  Procedure Laterality Date   APPENDECTOMY  1949   at age 12   CERVICAL FUSION  03/2016   C 3&4   Cyst removed from breast  1960   FOOT SURGERY  01/2009   Left   HERNIA REPAIR  1956   at age 37 (single)   HERNIA REPAIR  1960   (double)   Left 2nd toe removed  03/2017   REVERSE SHOULDER ARTHROPLASTY Left 03/27/2024   Procedure: ARTHROPLASTY, SHOULDER, TOTAL, REVERSE;  Surgeon: Genelle Standing, MD;  Location: [facility];  Service: Orthopedics;  Laterality: Left;   SPINE SURGERY     TONSILLECTOMY AND ADENOIDECTOMY  ~ 1950   VASECTOMY  ~ 1984   Patient Active Problem List   Diagnosis Date Noted   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 03/27/2024   Leg pain 03/21/2024   Shoulder pain 12/07/2023   Change in stool 11/02/2023   Nasal lesion 06/02/2023   Occipital neuralgia 10/12/2020   Wound of right leg, initial encounter  07/03/2019   Healthcare maintenance 03/04/2017   Foot pain 07/22/2016   Cervical stenosis of spine 01/06/2016   thyroid  nodule-benign path on bx 01/2016 01/06/2016   Radicular pain in right arm 12/22/2015   Radicular leg pain 12/22/2015   Back pain 07/06/2015   Constipation 07/06/2015   FH: CAD (coronary artery disease) 05/23/2015   Family history of ischemic heart disease and other diseases of the circulatory system 05/23/2015   Advance care planning 11/26/2014   Pain in joint, shoulder region 11/26/2014   Medicare annual wellness visit, subsequent 09/27/2012   GERD (gastroesophageal reflux disease) 07/05/2012   Irritability 03/29/2011   Advance directive on file 01/09/2011   Pain in joint, other site 01/04/2011   Arthropathy 09/02/2010   NEPHROLITHIASIS, HX OF 09/02/2010   Diabetes mellitus without complication (HCC) 09/01/2010   HLD (hyperlipidemia) 10/24/2009   SNORING 10/24/2009   FOOT PAIN, BILATERAL 10/06/2009   Pes planus 10/06/2009   Acquired flat foot 10/06/2009    PCP: Cleatus Arlyss RAMAN, MD  REFERRING PROVIDER: Genelle Standing, MD  REFERRING DIAG: S46.012A (ICD-10-CM) - 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, initial  encounter;  1. Left shoulder reverse shoulder arthroplasty 2. Left shoulder biceps tenodesis  THERAPY DIAG:  Acute pain of left shoulder  Muscle weakness (generalized)  Stiffness of left shoulder, not elsewhere classified  Other symptoms and signs involving the musculoskeletal system  Impingement syndrome of left shoulder  Rationale for Evaluation and Treatment: Rehabilitation  ONSET DATE: DOS 03/27/24  SUBJECTIVE:                                                                                                                                                                                      SUBJECTIVE STATEMENT: Still reporting increased pain in L shoulder. Seeing doctor for xray today. Unable to lift arm. Reports he was in more pain  after previous PT session.   EVAL: Patient states shoulder has been doing good. Using sling. Was very active prior to injury/surgery with gardening/yard work Hand dominance: Right  PERTINENT HISTORY: DM, HLD, chronic shoulder pain with fall in March while exiting ski lift with RC tear  PAIN:  Are you having pain? No 0/10 now    PRECAUTIONS: Other: L reverse TSA   RED FLAGS: None   WEIGHT BEARING RESTRICTIONS: NWB LUE  FALLS:  Has patient fallen in last 6 months? Yes. Number of falls 1  PLOF: Independent  PATIENT GOALS:regain use of the arm  OBJECTIVE: (objective measures from initial evaluation unless otherwise dated)  OBSERVATION: using sling, waterproof dressing intact  PATIENT SURVEYS:  UEFS  Extreme difficulty/unable (0), Quite a bit of difficulty (1), Moderate difficulty (2), Little difficulty (3), No difficulty (4) Survey date:  03/30/24  Any of your usual work, household or school activities 0  2. Your usual hobbies, recreational/sport activities 1   3. Lifting a bag of groceries to waist level 2   4. Lifting a bag of groceries above your head 0  5. Grooming your hair 4  6. Pushing up on your hands (I.e. from bathtub or chair) 0  7. Preparing food (I.e. peeling/cutting) 1  8. Driving  3  9. Vacuuming, sweeping, or raking 4  10. Dressing  3  11. Doing up buttons 2  12. Using tools/appliances 1  13. Opening doors 0  14. Cleaning  3  15. Tying or lacing shoes 2  16. Sleeping  3  17. Laundering clothes (I.e. washing, ironing, folding) 3  18. Opening a jar 3  19. Throwing a ball 1  20. Carrying a small suitcase with your affected limb.  1  Score total:  35/80     05/08/24- UEFS 15/80 via online version, later noted questions were different than one in epic   COGNITION: Overall cognitive status: Within functional  limits for tasks assessed     SENSATION: WFL  POSTURE: rounded shoulders and forward head   UPPER EXTREMITY ROM: AROM RUE WFL  Passive  ROM Left eval L 04/06/24 L 04/13/24 L 04/20/24 L 04/24/24 L 04/27/24 L 9/17 L  9/19 L 9/26  Shoulder flexion 100 PROM 110 AAROM 110* PROM, 120* AAROM supine  120-130* PROM (bit guarded today) 130* PROM and AROM  114 AROM 140 AAROM 145 AAROM 138 AAROM Improves to 149 AAROM 139 AAROM 148 AAROM improves to 153 AAROM 144* AAROM   Shoulder extension           Shoulder abduction           Shoulder adduction           Shoulder internal rotation         Mid glute. L5 at best with IR stretch   Shoulder external rotation 20 25* next to body PROM setaed (very guarded), 30* AAROM seated  30* PROM arm next to body  30* PROM arm next to body      30* PROM/AAROM supine arm next to body but limited by anterior shoulder pain   Elbow flexion           Elbow extension           Wrist flexion           Wrist extension           Wrist ulnar deviation           Wrist radial deviation           Wrist pronation           Wrist supination           (Blank rows = not tested) *=pain/symptoms  UPPER EXTREMITY MMT: NT due to post op status  MMT Right eval Left eval Left 05/11/24  Shoulder flexion   3 UT compensations   Shoulder extension     Shoulder abduction   3 UT compensations   Shoulder adduction     Shoulder internal rotation     Shoulder external rotation     Middle trapezius     Lower trapezius     Elbow flexion     Elbow extension     Wrist flexion     Wrist extension     Wrist ulnar deviation     Wrist radial deviation     Wrist pronation     Wrist supination     Grip strength (lbs)     (Blank rows = not tested) *=pain/symptoms    PALPATION:  LUE gross edema   TODAY'S TREATMENT:                                                                                                                                         DATE:  05/22/24 STM and trigger point release to  L upper trap, deltoid, and bicep  Massage gun to L upper trap, deltoid, and bicep  Grade II-III posterior and inferior  mobilizations at L shoulder  PROM to L shoulder  05/18/24 STM and trigger point release to L upper trap, deltoid, and bicep. Grade II-III posterior and inferior mobilizations at shoulder  Pulleys x6 min  Serratus punch + full alphabet 3# x2 rounds supine Standing shoulder abduction 2x10 within range without shrugging shoulder  Shoulder flexion with GTB around hands for RC stability 2x10  05/15/24 Pulleys x6 minutes for w/u Wall ladder flexion x12 Scap retractions blue TB x15  Shoulder extensions red TB x15 Standing shoulder flexion to 90* 0# x10 Standing shoulder ABD to 70-80* 0# x10  Serratus punch 3# x12 supine  Serratus punch + CW/CCW circles 3# x15 each way  supine  Serratus punch + full alphabet 3# x2 rounds supine  Wall ball x10 CW/CCW Thoracic extension stretch + shoulder flexion x10  Thoracic lateral flexion stretch x10 B Thoracic rotation stretch x10 B  05/11/24 Pulleys x6 minutes for w/u  ROM, MMT, goals for progress note, education on healing status given recent surgery/benefit of rest days and tapering activity, importance of reducing inflammation in the shoulder  Supine flexion AAROM with dowel x12 IR stretch with sheet to tolerance x12 L stretch at table 10x3 seconds  Supine shoulder flexion 1# 2x10- unable to tolerate 2# today  Held off on sidelying ABD and ER due to pain patterns today  Scap retractions blue TB x15  Shoulder extensions red TB x10   05/08/24 Wall ladder for flexion x15  Pulleys x4 minutes Gentle IR stretch with towel x10 L stretch at high table 10x3 seconds for shoulder flexion Supine shoulder flexion 2# 2x10 Sidelying shoulder ABD 0# 2x12 Supine serratus punches 2# x12 Supine serratus punch + CW/CCW circles x12 each 2# Supine serratus punch 2# alphabet x1  9/19 Manual: Grade II-III inferior glides in flexion, STM deltoid, tricep Supine AAROM flexion with 3# bar 2 x 10 flat table ABCs in flexion 3 sets 1# Sidelying ER 2 x 12 1#   Sidelying abduction 3x10  Standing functional IR strech with cane 10 x 10 second holds gentle Standing row GTB 1x15, progressed to BTB 1x15  05/02/24 Pulley flexion 4 minutes Supine AAROM flexion with 2# bar 2 x 10 flat table Manual: Grade II-III inferior glides in flexion, STM deltoid, tricep Perturbations in flexion 3 x 20 Supine SA punch 3# 2 x 15 Sidelying ER 1 x 12, 1# 1 x 12 Standing  functional IR strech with cane 10 x 10 second holds gentle Standing row GTB 2x 10 Wall ladder flexion 1 x 10  04/27/24 Pulley flexion 4 minutes Manual: Grade II-III inferior glides in flexion, STM deltoid, tricep Supine AAROM flexion with 1# bar 2 x 10 flat table, 30 degree incline 1# 1 x 10 Prone shoulder extension 1# 3 x 10 Prone row 1# 3 x 10 Wall ladder flexion 1 x 10  PATIENT EDUCATION:  Education details: Patient educated on exam findings, POC, scope of PT, HEP, relevant anatomy/biomechanics, protocol/precautions. Person educated: Patient Education method: Explanation, Demonstration, and Handouts Education comprehension: verbalized understanding, returned demonstration, verbal cues required, and tactile cues required  HOME EXERCISE PROGRAM:  Access Code: [email or web address] URL: [email or web address] Date: 04/13/2024 Prepared by: Josette Rough  Exercises - Wrist AROM Flexion Extension  - 3 x daily - 7 x weekly - 2 sets - 10 reps - Seated Elbow Flexion and Extension AROM (Mirrored)  -  3 x daily - 7 x weekly - 2 sets - 10 reps - Circular Shoulder Pendulum with Table Support (Mirrored)  - 3 x daily - 7 x weekly - 2 sets - 10 reps - Seated Scapular Retraction  - 3 x daily - 7 x weekly - 2 sets - 10 reps - Seated Shoulder Flexion Towel Slide at Table Top (Mirrored)  - 3 x daily - 7 x weekly - 10 reps - 10 second hold - Supine Shoulder Flexion AAROM with Hands Clasped  - 3 x daily - 7 x weekly - 3 sets - 10 reps - Supine Shoulder External Rotation with Dowel  - 3 x daily - 7 x weekly  - 2 sets - 10 reps - Standing Isometric Shoulder Flexion with Doorway - Arm Bent  - 1 x daily - 7 x weekly - 2 sets - 10 reps - 3 seconds  hold - Standing Isometric Shoulder Abduction with Doorway - Arm Bent  - 1 x daily - 7 x weekly - 2 sets - 10 reps - 3 seconds  hold - Standing Isometric Shoulder Extension with Doorway - Arm Bent  - 1 x daily - 7 x weekly - 2 sets - 10 reps - 3 seconds  hold - Standing Isometric Shoulder External Rotation with Doorway  - 1 x daily - 7 x weekly - 2 sets - 10 reps - 3 seconds  hold   ASSESSMENT:  CLINICAL IMPRESSION: Patient tolerated treatment well today but was limited due to increased stiffness to L shoulder. Manual therapy utilized to aid in decreasing pain stiffness in L shoulder. L shoulder ROM improved following manual therapy but remained limited due to pain. Educated on activity modification at home. Will continue from therapy to address remaining limitations and return to prior level of function.   EVAL: Patient a 83 y.o. y.o. male who was seen today for physical therapy evaluation and treatment for L reverse TSA DOS 03/27/24. Patient presents with pain limited deficits in L shoulder strength, ROM, endurance, activity tolerance, and functional mobility with ADL. Patient is having to modify and restrict ADL as indicated by outcome measure score as well as subjective information and objective measures which is affecting overall participation. Patient will benefit from skilled physical therapy in order to improve function and reduce impairment.  OBJECTIVE IMPAIRMENTS: decreased activity tolerance, decreased endurance, decreased mobility, decreased ROM, decreased strength, increased muscle spasms, impaired flexibility, impaired UE functional use, improper body mechanics, and pain  ACTIVITY LIMITATIONS:  carrying, lifting, bending, sleeping, bed mobility, bathing, dressing, reach over head, hygiene/grooming, and caring for others  PARTICIPATION LIMITATIONS:   meal prep, cleaning, laundry, driving, shopping, community activity, and yard work  PERSONAL FACTORS: Age, Time since onset of injury/illness/exacerbation, and 1-2 comorbidities: DM, HLD are also affecting patient's functional outcome.   REHAB POTENTIAL: Good  CLINICAL DECISION MAKING: Evolving/moderate complexity  EVALUATION COMPLEXITY: Moderate  GOALS: Goals reviewed with patient? Yes  SHORT TERM GOALS: Target date: 05/11/2024    Patient will be independent with HEP in order to improve functional outcomes. Baseline: Goal status: IN PROGRESS 04/06/24  2.  Patient will report at least 25% improvement in symptoms for improved quality of life. Baseline:  Goal status: IN PROGRESS 04/20/24  3.  Patient will demonstrate Elevation to 130 deg and ER to 30 deg - passive, active assisted or active  Baseline:  Goal status: MET 04/20/24  4.  Patient will be out of sling per surgeon approval for improved functional use  of L shoulder. Baseline:  Goal status: MET 04/13/24   LONG TERM GOALS: Target date: 06/22/2024    Patient will report at least 75% improvement in symptoms for improved quality of life. Baseline:  Goal status: IN PROGRESS 05/11/24  2.  Patient will improve UEFS score by at least 25 points in order to indicate improved tolerance to activity. Baseline:  Goal status: IN PROGRESS 05/11/24  3.  Patient will demonstrate at least PROM/AAROM/AROM to: Elevation - 145-160; ER - 40-50 ; functional IR to L1 in L shoulder for improved ability lift overhead. Baseline:  Goal status: IN PROGRESS 05/11/24  4.  Patient will be able to return to all activities unrestricted for improved ability to perform yard work functions and participate with family.  Baseline:  Goal status: IN PROGRESS 05/11/24  5.  Patient will demonstrate grade of 5/5 MMT grade in all tested musculature as evidence of improved strength to assist with lifting at home. Baseline:  Goal status: IN PROGRESS  05/11/24   PLAN:  PT FREQUENCY: 1-2x/week  PT DURATION: 12 weeks  PLANNED INTERVENTIONS:[id number]- PT Re-evaluation, 97110-Therapeutic exercises, 97530- Therapeutic activity, 97112- Neuromuscular re-education, 97535- Self Care, [id number]- Manual therapy, [phone]- Gait training, [phone]- Orthotic Fit/training, [phone]- Canalith repositioning, [id number]- Aquatic Therapy, [phone]- Splinting, [phone]- Wound care (first 20 sq cm), 97598- Wound care (each additional 20 sq cm)Patient/Family education, Balance training, Stair training, Taping, Dry Needling, Joint mobilization, Joint manipulation, Spinal manipulation, Spinal mobilization, Scar mobilization, and DME instructions.  PLAN FOR NEXT SESSION: progress with reverse TSA protocol,  Continue ROM work but caution with intensity of strengthening, less than 5# and keep reps low to avoid acromial stress reaction per MD.   Lili Finder, Student-PT 05/22/2024, 8:46 AM   This entire session was performed under direct supervision and direction of a licensed therapist/therapist assistant . I have personally read, edited and approve of the note as written. 10:58 AM, 05/22/24 Prentice CANDIE Stains PT, DPT Physical Therapist at [facility]

## 2024-05-25 ENCOUNTER — Ambulatory Visit (HOSPITAL_BASED_OUTPATIENT_CLINIC_OR_DEPARTMENT_OTHER): Admitting: Physical Therapy

## 2024-05-25 ENCOUNTER — Encounter (HOSPITAL_BASED_OUTPATIENT_CLINIC_OR_DEPARTMENT_OTHER): Payer: Self-pay | Admitting: Physical Therapy

## 2024-05-25 DIAGNOSIS — M7542 Impingement syndrome of left shoulder: Secondary | ICD-10-CM | POA: Diagnosis not present

## 2024-05-25 DIAGNOSIS — M25612 Stiffness of left shoulder, not elsewhere classified: Secondary | ICD-10-CM | POA: Diagnosis not present

## 2024-05-25 DIAGNOSIS — M25512 Pain in left shoulder: Secondary | ICD-10-CM

## 2024-05-25 DIAGNOSIS — M6281 Muscle weakness (generalized): Secondary | ICD-10-CM | POA: Diagnosis not present

## 2024-05-25 DIAGNOSIS — R29898 Other symptoms and signs involving the musculoskeletal system: Secondary | ICD-10-CM | POA: Diagnosis not present

## 2024-05-25 NOTE — Therapy (Signed)
 OUTPATIENT PHYSICAL THERAPY SHOULDER TREATMENT NOTE   Patient Name: Greg Rogers. MRN: [id number] DOB:1941/07/11, 83 y.o., male Today's Date: 05/25/2024  END OF SESSION:  PT End of Session - 05/25/24 0847     Visit Number 14    Number of Visits 24    Date for Recertification  06/22/24    Authorization Type MCR    Progress Note Due on Visit 20    PT Start Time 0847    PT Stop Time 0925    PT Time Calculation (min) 38 min    Activity Tolerance Patient tolerated treatment well    Behavior During Therapy WFL for tasks assessed/performed         Past Medical History:  Diagnosis Date   Abnormal GGT test    With otherwise normal work up.   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)    Persistent disorder of initiating or maintaining sleep    Pes planus    Rosacea    Snoring    Past Surgical History:  Procedure Laterality Date   APPENDECTOMY  1949   at age 89   CERVICAL FUSION  03/2016   C 3&4   Cyst removed from breast  1960   FOOT SURGERY  01/2009   Left   HERNIA REPAIR  1956   at age 78 (single)   HERNIA REPAIR  1960   (double)   Left 2nd toe removed  03/2017   REVERSE SHOULDER ARTHROPLASTY Left 03/27/2024   Procedure: ARTHROPLASTY, SHOULDER, TOTAL, REVERSE;  Surgeon: Genelle Standing, MD;  Location: [facility];  Service: Orthopedics;  Laterality: Left;   SPINE SURGERY     TONSILLECTOMY AND ADENOIDECTOMY  ~ 1950   VASECTOMY  ~ 1984   Patient Active Problem List   Diagnosis Date Noted   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 03/27/2024   Leg pain 03/21/2024   Shoulder pain 12/07/2023   Change in stool 11/02/2023   Nasal lesion 06/02/2023   Occipital neuralgia 10/12/2020   Wound of right leg, initial encounter  07/03/2019   Healthcare maintenance 03/04/2017   Foot pain 07/22/2016   Cervical stenosis of spine 01/06/2016   thyroid  nodule-benign path on bx 01/2016 01/06/2016   Radicular pain in right arm 12/22/2015   Radicular leg pain 12/22/2015   Back pain 07/06/2015   Constipation 07/06/2015   FH: CAD (coronary artery disease) 05/23/2015   Family history of ischemic heart disease and other diseases of the circulatory system 05/23/2015   Advance care planning 11/26/2014   Pain in joint, shoulder region 11/26/2014   Medicare annual wellness visit, subsequent 09/27/2012   GERD (gastroesophageal reflux disease) 07/05/2012   Irritability 03/29/2011   Advance directive on file 01/09/2011   Pain in joint, other site 01/04/2011   Arthropathy 09/02/2010   NEPHROLITHIASIS, HX OF 09/02/2010   Diabetes mellitus without complication (HCC) 09/01/2010   HLD (hyperlipidemia) 10/24/2009   SNORING 10/24/2009   FOOT PAIN, BILATERAL 10/06/2009   Pes planus 10/06/2009   Acquired flat foot 10/06/2009    PCP: Cleatus Arlyss RAMAN, MD  REFERRING PROVIDER: Genelle Standing, MD  REFERRING DIAG: S46.012A (ICD-10-CM) - 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, initial  encounter;  1. Left shoulder reverse shoulder arthroplasty 2. Left shoulder biceps tenodesis  THERAPY DIAG:  Acute pain of left shoulder  Muscle weakness (generalized)  Stiffness of left shoulder, not elsewhere classified  Rationale for Evaluation and Treatment: Rehabilitation  ONSET DATE: DOS 03/27/24  SUBJECTIVE:                                                                                                                                                                                      SUBJECTIVE STATEMENT: Used massager on neck / Lt shoulder this morning and it helped.  EVAL: Patient states shoulder has been doing good. Using sling. Was very active prior to injury/surgery with gardening/yard work Hand dominance:  Right  PERTINENT HISTORY: DM, HLD, chronic shoulder pain with fall in March while exiting ski lift with RC tear  PAIN:  Are you having pain? Yes 2/10 Location: anterior Lt shoulder Description: dull, uncomfortable      PRECAUTIONS: Other: L reverse TSA   RED FLAGS: None   WEIGHT BEARING RESTRICTIONS: NWB LUE at eval  FALLS:  Has patient fallen in last 6 months? Yes. Number of falls 1  PLOF: Independent  PATIENT GOALS:regain use of the arm  OBJECTIVE: (objective measures from initial evaluation unless otherwise dated)  OBSERVATION: using sling, waterproof dressing intact  PATIENT SURVEYS:  UEFS  Extreme difficulty/unable (0), Quite a bit of difficulty (1), Moderate difficulty (2), Little difficulty (3), No difficulty (4) Survey date:  03/30/24  Any of your usual work, household or school activities 0  2. Your usual hobbies, recreational/sport activities 1   3. Lifting a bag of groceries to waist level 2   4. Lifting a bag of groceries above your head 0  5. Grooming your hair 4  6. Pushing up on your hands (I.e. from bathtub or chair) 0  7. Preparing food (I.e. peeling/cutting) 1  8. Driving  3  9. Vacuuming, sweeping, or raking 4  10. Dressing  3  11. Doing up buttons 2  12. Using tools/appliances 1  13. Opening doors 0  14. Cleaning  3  15. Tying or lacing shoes 2  16. Sleeping  3  17. Laundering clothes (I.e. washing, ironing, folding) 3  18. Opening a jar 3  19. Throwing a ball 1  20. Carrying a small suitcase with your affected limb.  1  Score total:  35/80     05/08/24- UEFS 15/80 via online version, later noted questions were different than one in epic   COGNITION: Overall cognitive status: Within functional limits for tasks assessed     SENSATION: WFL  POSTURE: rounded shoulders and forward head   UPPER  EXTREMITY ROM: AROM RUE WFL  Passive ROM Left eval L 04/06/24 L 04/13/24 L 04/20/24 L 04/24/24 L 04/27/24 L 9/17 L  9/19 L 9/26  Shoulder  flexion 100 PROM 110 AAROM 110* PROM, 120* AAROM supine  120-130* PROM (bit guarded today) 130* PROM and AROM  114 AROM 140 AAROM 145 AAROM 138 AAROM Improves to 149 AAROM 139 AAROM 148 AAROM improves to 153 AAROM 144* AAROM   Shoulder extension           Shoulder abduction           Shoulder adduction           Shoulder internal rotation         Mid glute. L5 at best with IR stretch   Shoulder external rotation 20 25* next to body PROM setaed (very guarded), 30* AAROM seated  30* PROM arm next to body  30* PROM arm next to body      30* PROM/AAROM supine arm next to body but limited by anterior shoulder pain   Elbow flexion           Elbow extension           Wrist flexion           Wrist extension           Wrist ulnar deviation           Wrist radial deviation           Wrist pronation           Wrist supination           (Blank rows = not tested) *=pain/symptoms  UPPER EXTREMITY MMT: NT due to post op status  MMT Right eval Left eval Left 05/11/24  Shoulder flexion   3 UT compensations   Shoulder extension     Shoulder abduction   3 UT compensations   Shoulder adduction     Shoulder internal rotation     Shoulder external rotation     Middle trapezius     Lower trapezius     Elbow flexion     Elbow extension     Wrist flexion     Wrist extension     Wrist ulnar deviation     Wrist radial deviation     Wrist pronation     Wrist supination     Grip strength (lbs)     (Blank rows = not tested) *=pain/symptoms    PALPATION:  LUE gross edema   TODAY'S TREATMENT:                                                                                                                                         DATE:  05/25/24 -Pulleys into Rt shoulder flexion - ROM with cane in front of mirror:  bil shoulder ext, IR sliding gently behind back, bil shoulder flexion, Lt  scaption - row with scap squeeze 3 sec hold x 12, green band  - bow and arrow (midlevel row) with same side step  back, green band x 10 each side - shoulder extension to neutral with scap squeeze with red band x 10 in semi-staggered stance - applied reg KTtape in zigzag pattern over Lt shoulder incision. Pt given verbal instructions on safe removal technique; verbalized understanding    05/22/24 STM and trigger point release to L upper trap, deltoid, and bicep  Massage gun to L upper trap, deltoid, and bicep  Grade II-III posterior and inferior mobilizations at L shoulder  PROM to L shoulder  05/18/24 STM and trigger point release to L upper trap, deltoid, and bicep. Grade II-III posterior and inferior mobilizations at shoulder  Pulleys x6 min  Serratus punch + full alphabet 3# x2 rounds supine Standing shoulder abduction 2x10 within range without shrugging shoulder  Shoulder flexion with GTB around hands for RC stability 2x10  05/15/24 Pulleys x6 minutes for w/u Wall ladder flexion x12 Scap retractions blue TB x15  Shoulder extensions red TB x15 Standing shoulder flexion to 90* 0# x10 Standing shoulder ABD to 70-80* 0# x10  Serratus punch 3# x12 supine  Serratus punch + CW/CCW circles 3# x15 each way  supine  Serratus punch + full alphabet 3# x2 rounds supine  Wall ball x10 CW/CCW Thoracic extension stretch + shoulder flexion x10  Thoracic lateral flexion stretch x10 B Thoracic rotation stretch x10 B  05/11/24 Pulleys x6 minutes for w/u  ROM, MMT, goals for progress note, education on healing status given recent surgery/benefit of rest days and tapering activity, importance of reducing inflammation in the shoulder  Supine flexion AAROM with dowel x12 IR stretch with sheet to tolerance x12 L stretch at table 10x3 seconds  Supine shoulder flexion 1# 2x10- unable to tolerate 2# today  Held off on sidelying ABD and ER due to pain patterns today  Scap retractions blue TB x15  Shoulder extensions red TB x10   05/08/24 Wall ladder for flexion x15  Pulleys x4 minutes Gentle IR stretch with  towel x10 L stretch at high table 10x3 seconds for shoulder flexion Supine shoulder flexion 2# 2x10 Sidelying shoulder ABD 0# 2x12 Supine serratus punches 2# x12 Supine serratus punch + CW/CCW circles x12 each 2# Supine serratus punch 2# alphabet x1  9/19 Manual: Grade II-III inferior glides in flexion, STM deltoid, tricep Supine AAROM flexion with 3# bar 2 x 10 flat table ABCs in flexion 3 sets 1# Sidelying ER 2 x 12 1#  Sidelying abduction 3x10  Standing functional IR strech with cane 10 x 10 second holds gentle Standing row GTB 1x15, progressed to BTB 1x15  05/02/24 Pulley flexion 4 minutes Supine AAROM flexion with 2# bar 2 x 10 flat table Manual: Grade II-III inferior glides in flexion, STM deltoid, tricep Perturbations in flexion 3 x 20 Supine SA punch 3# 2 x 15 Sidelying ER 1 x 12, 1# 1 x 12 Standing  functional IR strech with cane 10 x 10 second holds gentle Standing row GTB 2x 10 Wall ladder flexion 1 x 10  04/27/24 Pulley flexion 4 minutes Manual: Grade II-III inferior glides in flexion, STM deltoid, tricep Supine AAROM flexion with 1# bar 2 x 10 flat table, 30 degree incline 1# 1 x 10 Prone shoulder extension 1# 3 x 10 Prone row 1# 3 x 10 Wall ladder flexion 1 x 10  PATIENT EDUCATION:  Education details: Patient educated on exam findings,  POC, scope of PT, HEP, relevant anatomy/biomechanics, protocol/precautions. Person educated: Patient Education method: Explanation, Demonstration, and Handouts Education comprehension: verbalized understanding, returned demonstration, verbal cues required, and tactile cues required  HOME EXERCISE PROGRAM:  Access Code: [email or web address] URL: [email or web address] Date: 04/13/2024 Prepared by: Josette Rough  Exercises - Wrist AROM Flexion Extension  - 3 x daily - 7 x weekly - 2 sets - 10 reps - Seated Elbow Flexion and Extension AROM (Mirrored)  - 3 x daily - 7 x weekly - 2 sets - 10 reps - Circular Shoulder Pendulum  with Table Support (Mirrored)  - 3 x daily - 7 x weekly - 2 sets - 10 reps - Seated Scapular Retraction  - 3 x daily - 7 x weekly - 2 sets - 10 reps - Seated Shoulder Flexion Towel Slide at Table Top (Mirrored)  - 3 x daily - 7 x weekly - 10 reps - 10 second hold - Supine Shoulder Flexion AAROM with Hands Clasped  - 3 x daily - 7 x weekly - 3 sets - 10 reps - Supine Shoulder External Rotation with Dowel  - 3 x daily - 7 x weekly - 2 sets - 10 reps - Standing Isometric Shoulder Flexion with Doorway - Arm Bent  - 1 x daily - 7 x weekly - 2 sets - 10 reps - 3 seconds  hold - Standing Isometric Shoulder Abduction with Doorway - Arm Bent  - 1 x daily - 7 x weekly - 2 sets - 10 reps - 3 seconds  hold - Standing Isometric Shoulder Extension with Doorway - Arm Bent  - 1 x daily - 7 x weekly - 2 sets - 10 reps - 3 seconds  hold - Standing Isometric Shoulder External Rotation with Doorway  - 1 x daily - 7 x weekly - 2 sets - 10 reps - 3 seconds  hold   ASSESSMENT:  CLINICAL IMPRESSION: Pt is 8 wks s/p Lt reverse TSA.  Patient tolerated treatment well today, with occasional increase in pain in ant shoulder with active Lt shoulder flexion.  Time spent in front of mirror with exercise for postural feedback.  Suggested pt repeat at home for avoiding shoulder hiking.  Trial of KT tape application over incision to assist with scar mobilization.  Will continue from therapy to address remaining limitations and return to prior level of function. Pt is making good progress towards remaining goals.   EVAL: Patient a 83 y.o. y.o. male who was seen today for physical therapy evaluation and treatment for L reverse TSA DOS 03/27/24. Patient presents with pain limited deficits in L shoulder strength, ROM, endurance, activity tolerance, and functional mobility with ADL. Patient is having to modify and restrict ADL as indicated by outcome measure score as well as subjective information and objective measures which is affecting  overall participation. Patient will benefit from skilled physical therapy in order to improve function and reduce impairment.  OBJECTIVE IMPAIRMENTS: decreased activity tolerance, decreased endurance, decreased mobility, decreased ROM, decreased strength, increased muscle spasms, impaired flexibility, impaired UE functional use, improper body mechanics, and pain  ACTIVITY LIMITATIONS:  carrying, lifting, bending, sleeping, bed mobility, bathing, dressing, reach over head, hygiene/grooming, and caring for others  PARTICIPATION LIMITATIONS:  meal prep, cleaning, laundry, driving, shopping, community activity, and yard work  PERSONAL FACTORS: Age, Time since onset of injury/illness/exacerbation, and 1-2 comorbidities: DM, HLD are also affecting patient's functional outcome.   REHAB POTENTIAL: Good  CLINICAL DECISION MAKING: Evolving/moderate complexity  EVALUATION COMPLEXITY: Moderate  GOALS: Goals reviewed with patient? Yes  SHORT TERM GOALS: Target date: 05/11/2024    Patient will be independent with HEP in order to improve functional outcomes. Baseline: Goal status: IN PROGRESS 04/06/24  2.  Patient will report at least 25% improvement in symptoms for improved quality of life. Baseline:  Goal status: IN PROGRESS 04/20/24  3.  Patient will demonstrate Elevation to 130 deg and ER to 30 deg - passive, active assisted or active  Baseline:  Goal status: MET 04/20/24  4.  Patient will be out of sling per surgeon approval for improved functional use of L shoulder. Baseline:  Goal status: MET 04/13/24   LONG TERM GOALS: Target date: 06/22/2024    Patient will report at least 75% improvement in symptoms for improved quality of life. Baseline:  Goal status: IN PROGRESS 05/11/24  2.  Patient will improve UEFS score by at least 25 points in order to indicate improved tolerance to activity. Baseline:  Goal status: IN PROGRESS 05/11/24  3.  Patient will demonstrate at least PROM/AAROM/AROM  to: Elevation - 145-160; ER - 40-50 ; functional IR to L1 in L shoulder for improved ability lift overhead. Baseline:  Goal status: IN PROGRESS 05/11/24  4.  Patient will be able to return to all activities unrestricted for improved ability to perform yard work functions and participate with family.  Baseline:  Goal status: IN PROGRESS 05/11/24  5.  Patient will demonstrate grade of 5/5 MMT grade in all tested musculature as evidence of improved strength to assist with lifting at home. Baseline:  Goal status: IN PROGRESS 05/11/24   PLAN:  PT FREQUENCY: 1-2x/week  PT DURATION: 12 weeks  PLANNED INTERVENTIONS:[id number]- PT Re-evaluation, 97110-Therapeutic exercises, 97530- Therapeutic activity, 97112- Neuromuscular re-education, 97535- Self Care, [id number]- Manual therapy, [phone]- Gait training, [phone]- Orthotic Fit/training, [phone]- Canalith repositioning, [id number]- Aquatic Therapy, [phone]- Splinting, [phone]- Wound care (first 20 sq cm), 97598- Wound care (each additional 20 sq cm)Patient/Family education, Balance training, Stair training, Taping, Dry Needling, Joint mobilization, Joint manipulation, Spinal manipulation, Spinal mobilization, Scar mobilization, and DME instructions.  PLAN FOR NEXT SESSION: progress with reverse TSA protocol,  Continue ROM work but caution with intensity of strengthening, less than 5# and keep reps low to avoid acromial stress reaction per MD.   Delon Aquas, PTA 05/25/24 4:04 PM [facility] [address] [place], [place], [address] Phone: [phone]   Fax:  [phone]

## 2024-05-29 ENCOUNTER — Ambulatory Visit (HOSPITAL_BASED_OUTPATIENT_CLINIC_OR_DEPARTMENT_OTHER): Admitting: Physical Therapy

## 2024-05-29 ENCOUNTER — Encounter (HOSPITAL_BASED_OUTPATIENT_CLINIC_OR_DEPARTMENT_OTHER): Payer: Self-pay | Admitting: Physical Therapy

## 2024-05-29 DIAGNOSIS — M25612 Stiffness of left shoulder, not elsewhere classified: Secondary | ICD-10-CM | POA: Diagnosis not present

## 2024-05-29 DIAGNOSIS — R29898 Other symptoms and signs involving the musculoskeletal system: Secondary | ICD-10-CM

## 2024-05-29 DIAGNOSIS — M6281 Muscle weakness (generalized): Secondary | ICD-10-CM | POA: Diagnosis not present

## 2024-05-29 DIAGNOSIS — M25512 Pain in left shoulder: Secondary | ICD-10-CM | POA: Diagnosis not present

## 2024-05-29 DIAGNOSIS — M7542 Impingement syndrome of left shoulder: Secondary | ICD-10-CM | POA: Diagnosis not present

## 2024-05-29 NOTE — Therapy (Addendum)
 OUTPATIENT PHYSICAL THERAPY SHOULDER TREATMENT NOTE   Patient Name: Greg Rogers. MRN: [id number] DOB:[birth year], 83 y.o., male Today's Date: 05/29/2024  END OF SESSION:  PT End of Session - 05/29/24 0839     Visit Number 15    Number of Visits 24    Date for Recertification  06/22/24    Authorization Type MCR    Progress Note Due on Visit 20    PT Start Time 0841    PT Stop Time 0925    PT Time Calculation (min) 44 min    Activity Tolerance Patient tolerated treatment well    Behavior During Therapy [facility] for tasks assessed/performed         Past Medical History:  Diagnosis Date   Abnormal GGT test    With otherwise normal work up.   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)    Persistent disorder of initiating or maintaining sleep    Pes planus    Rosacea    Snoring    Past Surgical History:  Procedure Laterality Date   APPENDECTOMY  1949   at age 72   CERVICAL FUSION  03/2016   C 3&4   Cyst removed from breast  1960   FOOT SURGERY  01/2009   Left   HERNIA REPAIR  1956   at age 28 (single)   HERNIA REPAIR  1960   (double)   Left 2nd toe removed  03/2017   REVERSE SHOULDER ARTHROPLASTY Left 03/27/2024   Procedure: ARTHROPLASTY, SHOULDER, TOTAL, REVERSE;  Surgeon: Genelle Standing, MD;  Location: [facility];  Service: Orthopedics;  Laterality: Left;   SPINE SURGERY     TONSILLECTOMY AND ADENOIDECTOMY  ~ 1950   VASECTOMY  ~ 1984   Patient Active Problem List   Diagnosis Date Noted   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 03/27/2024   Leg pain 03/21/2024   Shoulder pain 12/07/2023   Change in stool 11/02/2023   Nasal lesion 06/02/2023   Occipital neuralgia 10/12/2020   Wound of right leg, initial encounter  07/03/2019   Healthcare maintenance 03/04/2017   Foot pain 07/22/2016   Cervical stenosis of spine 01/06/2016   thyroid  nodule-benign path on bx 01/2016 01/06/2016   Radicular pain in right arm 12/22/2015   Radicular leg pain 12/22/2015   Back pain 07/06/2015   Constipation 07/06/2015   FH: CAD (coronary artery disease) 05/23/2015   Family history of ischemic heart disease and other diseases of the circulatory system 05/23/2015   Advance care planning 11/26/2014   Pain in joint, shoulder region 11/26/2014   Medicare annual wellness visit, subsequent 09/27/2012   GERD (gastroesophageal reflux disease) 07/05/2012   Irritability 03/29/2011   Advance directive on file 01/09/2011   Pain in joint, other site 01/04/2011   Arthropathy 09/02/2010   NEPHROLITHIASIS, HX OF 09/02/2010   Diabetes mellitus without complication (HCC) 09/01/2010   HLD (hyperlipidemia) 10/24/2009   SNORING 10/24/2009   FOOT PAIN, BILATERAL 10/06/2009   Pes planus 10/06/2009   Acquired flat foot 10/06/2009    PCP: Cleatus Arlyss RAMAN, MD  REFERRING PROVIDER: Genelle Standing, MD  REFERRING DIAG: S46.012A (ICD-10-CM) - 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, initial  encounter;  1. Left shoulder reverse shoulder arthroplasty 2. Left shoulder biceps tenodesis  THERAPY DIAG:  Acute pain of left shoulder  Muscle weakness (generalized)  Stiffness of left shoulder, not elsewhere classified  Other symptoms and signs involving the musculoskeletal system  Rationale for Evaluation and Treatment: Rehabilitation  ONSET DATE: DOS 03/27/24  SUBJECTIVE:                                                                                                                                                                                      SUBJECTIVE STATEMENT: Reports 8/10 pain after previous therapy session that followed him into the weekend. Daughter came over and massaged him and it helped but he reports having little  strength in arm now.   EVAL: Patient states shoulder has been doing good. Using sling. Was very active prior to injury/surgery with gardening/yard work Hand dominance: Right  PERTINENT HISTORY: DM, HLD, chronic shoulder pain with fall in March while exiting ski lift with RC tear  PAIN:  Are you having pain? Yes 2/10 Location: anterior Lt shoulder Description: dull, uncomfortable      PRECAUTIONS: Other: L reverse TSA   RED FLAGS: None   WEIGHT BEARING RESTRICTIONS: NWB LUE at eval  FALLS:  Has patient fallen in last 6 months? Yes. Number of falls 1  PLOF: Independent  PATIENT GOALS:regain use of the arm  OBJECTIVE: (objective measures from initial evaluation unless otherwise dated)  OBSERVATION: using sling, waterproof dressing intact  PATIENT SURVEYS:  UEFS  Extreme difficulty/unable (0), Quite a bit of difficulty (1), Moderate difficulty (2), Little difficulty (3), No difficulty (4) Survey date:  03/30/24  Any of your usual work, household or school activities 0  2. Your usual hobbies, recreational/sport activities 1   3. Lifting a bag of groceries to waist level 2   4. Lifting a bag of groceries above your head 0  5. Grooming your hair 4  6. Pushing up on your hands (I.e. from bathtub or chair) 0  7. Preparing food (I.e. peeling/cutting) 1  8. Driving  3  9. Vacuuming, sweeping, or raking 4  10. Dressing  3  11. Doing up buttons 2  12. Using tools/appliances 1  13. Opening doors 0  14. Cleaning  3  15. Tying or lacing shoes 2  16. Sleeping  3  17. Laundering clothes (I.e. washing, ironing, folding) 3  18. Opening a jar 3  19. Throwing a ball 1  20. Carrying a small suitcase with your affected limb.  1  Score total:  35/80     05/08/24- UEFS 15/80 via online version, later noted questions were different than one in  epic   COGNITION: Overall cognitive status: Within functional limits for tasks assessed     SENSATION: WFL  POSTURE: rounded shoulders  and forward head   UPPER EXTREMITY ROM: AROM RUE WFL  Passive ROM Left eval L 04/06/24 L 04/13/24 L 04/20/24 L 04/24/24 L 04/27/24 L 9/17 L  9/19 L 9/26  Shoulder flexion 100 PROM 110 AAROM 110* PROM, 120* AAROM supine  120-130* PROM (bit guarded today) 130* PROM and AROM  114 AROM 140 AAROM 145 AAROM 138 AAROM Improves to 149 AAROM 139 AAROM 148 AAROM improves to 153 AAROM 144* AAROM   Shoulder extension           Shoulder abduction           Shoulder adduction           Shoulder internal rotation         Mid glute. L5 at best with IR stretch   Shoulder external rotation 20 25* next to body PROM setaed (very guarded), 30* AAROM seated  30* PROM arm next to body  30* PROM arm next to body      30* PROM/AAROM supine arm next to body but limited by anterior shoulder pain   Elbow flexion           Elbow extension           Wrist flexion           Wrist extension           Wrist ulnar deviation           Wrist radial deviation           Wrist pronation           Wrist supination           (Blank rows = not tested) *=pain/symptoms  UPPER EXTREMITY MMT: NT due to post op status  MMT Right eval Left eval Left 05/11/24  Shoulder flexion   3 UT compensations   Shoulder extension     Shoulder abduction   3 UT compensations   Shoulder adduction     Shoulder internal rotation     Shoulder external rotation     Middle trapezius     Lower trapezius     Elbow flexion     Elbow extension     Wrist flexion     Wrist extension     Wrist ulnar deviation     Wrist radial deviation     Wrist pronation     Wrist supination     Grip strength (lbs)     (Blank rows = not tested) *=pain/symptoms    PALPATION:  LUE gross edema   TODAY'S TREATMENT:  DATE:  05/29/24 STM and trigger point release to L upper trap, deltoid, and bicep  Grade II-III posterior  and inferior mobilizations at L shoulder  PROM to L shoulder AAROM shoulder flexion 3x10 Supine pec stretch 3x10 seconds   05/25/24 -Pulleys into Rt shoulder flexion - ROM with cane in front of mirror:  bil shoulder ext, IR sliding gently behind back, bil shoulder flexion, Lt scaption - row with scap squeeze 3 sec hold x 12, green band  - bow and arrow (midlevel row) with same side step back, green band x 10 each side - shoulder extension to neutral with scap squeeze with red band x 10 in semi-staggered stance - applied reg KTtape in zigzag pattern over Lt shoulder incision. Pt given verbal instructions on safe removal technique; verbalized understanding  05/22/24 STM and trigger point release to L upper trap, deltoid, and bicep  Massage gun to L upper trap, deltoid, and bicep  Grade II-III posterior and inferior mobilizations at L shoulder  PROM to L shoulder  05/18/24 STM and trigger point release to L upper trap, deltoid, and bicep. Grade II-III posterior and inferior mobilizations at shoulder  Pulleys x6 min  Serratus punch + full alphabet 3# x2 rounds supine Standing shoulder abduction 2x10 within range without shrugging shoulder  Shoulder flexion with GTB around hands for RC stability 2x10  05/15/24 Pulleys x6 minutes for w/u Wall ladder flexion x12 Scap retractions blue TB x15  Shoulder extensions red TB x15 Standing shoulder flexion to 90* 0# x10 Standing shoulder ABD to 70-80* 0# x10  Serratus punch 3# x12 supine  Serratus punch + CW/CCW circles 3# x15 each way  supine  Serratus punch + full alphabet 3# x2 rounds supine  Wall ball x10 CW/CCW Thoracic extension stretch + shoulder flexion x10  Thoracic lateral flexion stretch x10 B Thoracic rotation stretch x10 B  05/11/24 Pulleys x6 minutes for w/u  ROM, MMT, goals for progress note, education on healing status given recent surgery/benefit of rest days and tapering activity, importance of reducing inflammation in the  shoulder  Supine flexion AAROM with dowel x12 IR stretch with sheet to tolerance x12 L stretch at table 10x3 seconds  Supine shoulder flexion 1# 2x10- unable to tolerate 2# today  Held off on sidelying ABD and ER due to pain patterns today  Scap retractions blue TB x15  Shoulder extensions red TB x10   05/08/24 Wall ladder for flexion x15  Pulleys x4 minutes Gentle IR stretch with towel x10 L stretch at high table 10x3 seconds for shoulder flexion Supine shoulder flexion 2# 2x10 Sidelying shoulder ABD 0# 2x12 Supine serratus punches 2# x12 Supine serratus punch + CW/CCW circles x12 each 2# Supine serratus punch 2# alphabet x1  9/19 Manual: Grade II-III inferior glides in flexion, STM deltoid, tricep Supine AAROM flexion with 3# bar 2 x 10 flat table ABCs in flexion 3 sets 1# Sidelying ER 2 x 12 1#  Sidelying abduction 3x10  Standing functional IR strech with cane 10 x 10 second holds gentle Standing row GTB 1x15, progressed to BTB 1x15  05/02/24 Pulley flexion 4 minutes Supine AAROM flexion with 2# bar 2 x 10 flat table Manual: Grade II-III inferior glides in flexion, STM deltoid, tricep Perturbations in flexion 3 x 20 Supine SA punch 3# 2 x 15 Sidelying ER 1 x 12, 1# 1 x 12 Standing  functional IR strech with cane 10 x 10 second holds gentle Standing row GTB 2x 10 Wall ladder flexion 1  x 10  04/27/24 Pulley flexion 4 minutes Manual: Grade II-III inferior glides in flexion, STM deltoid, tricep Supine AAROM flexion with 1# bar 2 x 10 flat table, 30 degree incline 1# 1 x 10 Prone shoulder extension 1# 3 x 10 Prone row 1# 3 x 10 Wall ladder flexion 1 x 10  PATIENT EDUCATION:  Education details: Patient educated on exam findings, POC, scope of PT, HEP, relevant anatomy/biomechanics, protocol/precautions. Person educated: Patient Education method: Explanation, Demonstration, and Handouts Education comprehension: verbalized understanding, returned demonstration, verbal  cues required, and tactile cues required  HOME EXERCISE PROGRAM:  Access Code: [email or web address] URL: [email or web address] Date: 04/13/2024 Prepared by: Josette Rough  Exercises - Wrist AROM Flexion Extension  - 3 x daily - 7 x weekly - 2 sets - 10 reps - Seated Elbow Flexion and Extension AROM (Mirrored)  - 3 x daily - 7 x weekly - 2 sets - 10 reps - Circular Shoulder Pendulum with Table Support (Mirrored)  - 3 x daily - 7 x weekly - 2 sets - 10 reps - Seated Scapular Retraction  - 3 x daily - 7 x weekly - 2 sets - 10 reps - Seated Shoulder Flexion Towel Slide at Table Top (Mirrored)  - 3 x daily - 7 x weekly - 10 reps - 10 second hold - Supine Shoulder Flexion AAROM with Hands Clasped  - 3 x daily - 7 x weekly - 3 sets - 10 reps - Supine Shoulder External Rotation with Dowel  - 3 x daily - 7 x weekly - 2 sets - 10 reps - Standing Isometric Shoulder Flexion with Doorway - Arm Bent  - 1 x daily - 7 x weekly - 2 sets - 10 reps - 3 seconds  hold - Standing Isometric Shoulder Abduction with Doorway - Arm Bent  - 1 x daily - 7 x weekly - 2 sets - 10 reps - 3 seconds  hold - Standing Isometric Shoulder Extension with Doorway - Arm Bent  - 1 x daily - 7 x weekly - 2 sets - 10 reps - 3 seconds  hold - Standing Isometric Shoulder External Rotation with Doorway  - 1 x daily - 7 x weekly - 2 sets - 10 reps - 3 seconds  hold   ASSESSMENT:  CLINICAL IMPRESSION: Treatment today focused on alleviating pain and decreasing hyperactive musculature surrounding the shoulder. Patient has increased pain in L biceps, teres group, and anterior deltoid throughout the session and was unable to actively flex shoulder without significant pain. Verbal cueing for decreased guarding through upper trap. Educated on relevant anatomy, activity modification, and continued use of heat at home. Active shoulder flexion slightly improved at end of session. Will continue to benefit from therapy to address remaining  impairments and return to prior level.   EVAL: Patient a 83 y.o. y.o. male who was seen today for physical therapy evaluation and treatment for L reverse TSA DOS 03/27/24. Patient presents with pain limited deficits in L shoulder strength, ROM, endurance, activity tolerance, and functional mobility with ADL. Patient is having to modify and restrict ADL as indicated by outcome measure score as well as subjective information and objective measures which is affecting overall participation. Patient will benefit from skilled physical therapy in order to improve function and reduce impairment.  OBJECTIVE IMPAIRMENTS: decreased activity tolerance, decreased endurance, decreased mobility, decreased ROM, decreased strength, increased muscle spasms, impaired flexibility, impaired UE functional use, improper body mechanics, and pain  ACTIVITY LIMITATIONS:  carrying, lifting, bending,  sleeping, bed mobility, bathing, dressing, reach over head, hygiene/grooming, and caring for others  PARTICIPATION LIMITATIONS:  meal prep, cleaning, laundry, driving, shopping, community activity, and yard work  PERSONAL FACTORS: Age, Time since onset of injury/illness/exacerbation, and 1-2 comorbidities: DM, HLD are also affecting patient's functional outcome.   REHAB POTENTIAL: Good  CLINICAL DECISION MAKING: Evolving/moderate complexity  EVALUATION COMPLEXITY: Moderate  GOALS: Goals reviewed with patient? Yes  SHORT TERM GOALS: Target date: 05/11/2024    Patient will be independent with HEP in order to improve functional outcomes. Baseline: Goal status: IN PROGRESS 04/06/24  2.  Patient will report at least 25% improvement in symptoms for improved quality of life. Baseline:  Goal status: IN PROGRESS 04/20/24  3.  Patient will demonstrate Elevation to 130 deg and ER to 30 deg - passive, active assisted or active  Baseline:  Goal status: MET 04/20/24  4.  Patient will be out of sling per surgeon approval for improved  functional use of L shoulder. Baseline:  Goal status: MET 04/13/24   LONG TERM GOALS: Target date: 06/22/2024    Patient will report at least 75% improvement in symptoms for improved quality of life. Baseline:  Goal status: IN PROGRESS 05/11/24  2.  Patient will improve UEFS score by at least 25 points in order to indicate improved tolerance to activity. Baseline:  Goal status: IN PROGRESS 05/11/24  3.  Patient will demonstrate at least PROM/AAROM/AROM to: Elevation - 145-160; ER - 40-50 ; functional IR to L1 in L shoulder for improved ability lift overhead. Baseline:  Goal status: IN PROGRESS 05/11/24  4.  Patient will be able to return to all activities unrestricted for improved ability to perform yard work functions and participate with family.  Baseline:  Goal status: IN PROGRESS 05/11/24  5.  Patient will demonstrate grade of 5/5 MMT grade in all tested musculature as evidence of improved strength to assist with lifting at home. Baseline:  Goal status: IN PROGRESS 05/11/24   PLAN:  PT FREQUENCY: 1-2x/week  PT DURATION: 12 weeks  PLANNED INTERVENTIONS:[id number]- PT Re-evaluation, 97110-Therapeutic exercises, 97530- Therapeutic activity, 97112- Neuromuscular re-education, 97535- Self Care, [id number]- Manual therapy, [phone]- Gait training, [phone]- Orthotic Fit/training, [phone]- Canalith repositioning, [id number]- Aquatic Therapy, [phone]- Splinting, [phone]- Wound care (first 20 sq cm), 97598- Wound care (each additional 20 sq cm)Patient/Family education, Balance training, Stair training, Taping, Dry Needling, Joint mobilization, Joint manipulation, Spinal manipulation, Spinal mobilization, Scar mobilization, and DME instructions.  PLAN FOR NEXT SESSION: progress with reverse TSA protocol,  Continue ROM work but caution with intensity of strengthening, less than 5# and keep reps low to avoid acromial stress reaction per MD.   Lili Finder, Student PT 05/29/24 9:24 AM  This entire session was  performed under direct supervision and direction of a licensed therapist/therapist assistant . I have personally read, edited and approve of the note as written. 9:41 AM, 05/29/24 Prentice CANDIE Stains PT, DPT Physical Therapist at [facility]

## 2024-05-30 ENCOUNTER — Encounter: Payer: Self-pay | Admitting: Family Medicine

## 2024-05-30 ENCOUNTER — Other Ambulatory Visit: Payer: Self-pay | Admitting: Family Medicine

## 2024-05-30 DIAGNOSIS — E119 Type 2 diabetes mellitus without complications: Secondary | ICD-10-CM

## 2024-06-01 ENCOUNTER — Other Ambulatory Visit

## 2024-06-01 ENCOUNTER — Encounter (HOSPITAL_BASED_OUTPATIENT_CLINIC_OR_DEPARTMENT_OTHER): Payer: Self-pay | Admitting: Physical Therapy

## 2024-06-01 ENCOUNTER — Ambulatory Visit (HOSPITAL_BASED_OUTPATIENT_CLINIC_OR_DEPARTMENT_OTHER): Admitting: Physical Therapy

## 2024-06-01 DIAGNOSIS — R29898 Other symptoms and signs involving the musculoskeletal system: Secondary | ICD-10-CM | POA: Diagnosis not present

## 2024-06-01 DIAGNOSIS — M7542 Impingement syndrome of left shoulder: Secondary | ICD-10-CM | POA: Diagnosis not present

## 2024-06-01 DIAGNOSIS — M6281 Muscle weakness (generalized): Secondary | ICD-10-CM

## 2024-06-01 DIAGNOSIS — M25512 Pain in left shoulder: Secondary | ICD-10-CM | POA: Diagnosis not present

## 2024-06-01 DIAGNOSIS — M25612 Stiffness of left shoulder, not elsewhere classified: Secondary | ICD-10-CM

## 2024-06-01 DIAGNOSIS — E119 Type 2 diabetes mellitus without complications: Secondary | ICD-10-CM | POA: Diagnosis not present

## 2024-06-01 LAB — HEMOGLOBIN A1C: Hgb A1c MFr Bld: 6.9 % — ABNORMAL HIGH (ref 4.6–6.5)

## 2024-06-01 NOTE — Therapy (Addendum)
 OUTPATIENT PHYSICAL THERAPY SHOULDER TREATMENT NOTE   Patient Name: Greg Rogers. MRN: [id number] DOB:10-31-1940, 83 y.o., male Today's Date: 06/01/2024  END OF SESSION:  PT End of Session - 06/01/24 0846     Visit Number 15    Number of Visits 24    Date for Recertification  06/22/24    Authorization Type MCR    Progress Note Due on Visit 20    PT Start Time 0845    PT Stop Time 0925    PT Time Calculation (min) 40 min    Activity Tolerance Patient tolerated treatment well    Behavior During Therapy WFL for tasks assessed/performed         Past Medical History:  Diagnosis Date   Abnormal GGT test    With otherwise normal work up.   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)    Persistent disorder of initiating or maintaining sleep    Pes planus    Rosacea    Snoring    Past Surgical History:  Procedure Laterality Date   APPENDECTOMY  1949   at age 60   CERVICAL FUSION  03/2016   C 3&4   Cyst removed from breast  1960   FOOT SURGERY  01/2009   Left   HERNIA REPAIR  1956   at age 52 (single)   HERNIA REPAIR  1960   (double)   Left 2nd toe removed  03/2017   REVERSE SHOULDER ARTHROPLASTY Left 03/27/2024   Procedure: ARTHROPLASTY, SHOULDER, TOTAL, REVERSE;  Surgeon: Genelle Standing, MD;  Location: [facility];  Service: Orthopedics;  Laterality: Left;   SPINE SURGERY     TONSILLECTOMY AND ADENOIDECTOMY  ~ 1950   VASECTOMY  ~ 1984   Patient Active Problem List   Diagnosis Date Noted   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 03/27/2024   Leg pain 03/21/2024   Shoulder pain 12/07/2023   Change in stool 11/02/2023   Nasal lesion 06/02/2023   Occipital neuralgia 10/12/2020   Wound of right leg, initial encounter  07/03/2019   Healthcare maintenance 03/04/2017   Foot pain 07/22/2016   Cervical stenosis of spine 01/06/2016   thyroid  nodule-benign path on bx 01/2016 01/06/2016   Radicular pain in right arm 12/22/2015   Radicular leg pain 12/22/2015   Back pain 07/06/2015   Constipation 07/06/2015   FH: CAD (coronary artery disease) 05/23/2015   Family history of ischemic heart disease and other diseases of the circulatory system 05/23/2015   Advance care planning 11/26/2014   Pain in joint, shoulder region 11/26/2014   Medicare annual wellness visit, subsequent 09/27/2012   GERD (gastroesophageal reflux disease) 07/05/2012   Irritability 03/29/2011   Advance directive on file 01/09/2011   Pain in joint, other site 01/04/2011   Arthropathy 09/02/2010   NEPHROLITHIASIS, HX OF 09/02/2010   Diabetes mellitus without complication (HCC) 09/01/2010   HLD (hyperlipidemia) 10/24/2009   SNORING 10/24/2009   FOOT PAIN, BILATERAL 10/06/2009   Pes planus 10/06/2009   Acquired flat foot 10/06/2009    PCP: Cleatus Arlyss RAMAN, MD  REFERRING PROVIDER: Genelle Standing, MD  REFERRING DIAG: S46.012A (ICD-10-CM) - 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, initial  encounter;  1. Left shoulder reverse shoulder arthroplasty 2. Left shoulder biceps tenodesis  THERAPY DIAG:  Acute pain of left shoulder  Muscle weakness (generalized)  Stiffness of left shoulder, not elsewhere classified  Impingement syndrome of left shoulder  Other symptoms and signs involving the musculoskeletal system  Rationale for Evaluation and Treatment: Rehabilitation  ONSET DATE: DOS 03/27/24  SUBJECTIVE:                                                                                                                                                                                      SUBJECTIVE STATEMENT: Reports that he feels like he has turned the corner. Doing much better today than previously.   EVAL: Patient states  shoulder has been doing good. Using sling. Was very active prior to injury/surgery with gardening/yard work Hand dominance: Right  PERTINENT HISTORY: DM, HLD, chronic shoulder pain with fall in March while exiting ski lift with RC tear  PAIN:  Are you having pain? Yes 2/10 Location: anterior Lt shoulder Description: dull, uncomfortable      PRECAUTIONS: Other: L reverse TSA   RED FLAGS: None   WEIGHT BEARING RESTRICTIONS: NWB LUE at eval  FALLS:  Has patient fallen in last 6 months? Yes. Number of falls 1  PLOF: Independent  PATIENT GOALS:regain use of the arm  OBJECTIVE: (objective measures from initial evaluation unless otherwise dated)  OBSERVATION: using sling, waterproof dressing intact  PATIENT SURVEYS:  UEFS  Extreme difficulty/unable (0), Quite a bit of difficulty (1), Moderate difficulty (2), Little difficulty (3), No difficulty (4) Survey date:  03/30/24  Any of your usual work, household or school activities 0  2. Your usual hobbies, recreational/sport activities 1   3. Lifting a bag of groceries to waist level 2   4. Lifting a bag of groceries above your head 0  5. Grooming your hair 4  6. Pushing up on your hands (I.e. from bathtub or chair) 0  7. Preparing food (I.e. peeling/cutting) 1  8. Driving  3  9. Vacuuming, sweeping, or raking 4  10. Dressing  3  11. Doing up buttons 2  12. Using tools/appliances 1  13. Opening doors 0  14. Cleaning  3  15. Tying or lacing shoes 2  16. Sleeping  3  17. Laundering clothes (I.e. washing, ironing, folding) 3  18. Opening a jar 3  19. Throwing a ball 1  20. Carrying a small suitcase with your affected limb.  1  Score total:  35/80     05/08/24- UEFS 15/80 via online version, later noted questions were different than one in epic   COGNITION: Overall cognitive status: Within functional  limits for tasks assessed     SENSATION: WFL  POSTURE: rounded shoulders and forward head   UPPER EXTREMITY ROM: AROM  RUE WFL  Passive ROM Left eval L 04/06/24 L 04/13/24 L 04/20/24 L 04/24/24 L 04/27/24 L 9/17 L  9/19 L 9/26 L 06/01/24  Shoulder flexion 100 PROM 110 AAROM 110* PROM, 120* AAROM supine  120-130* PROM (bit guarded today) 130* PROM and AROM  114 AROM 140 AAROM 145 AAROM 138 AAROM Improves to 149 AAROM 139 AAROM 148 AAROM improves to 153 AAROM 144* AAROM  130 degrees on shoulder flexion ladder   Shoulder extension            Shoulder abduction            Shoulder adduction            Shoulder internal rotation         Mid glute. L5 at best with IR stretch    Shoulder external rotation 20 25* next to body PROM setaed (very guarded), 30* AAROM seated  30* PROM arm next to body  30* PROM arm next to body      30* PROM/AAROM supine arm next to body but limited by anterior shoulder pain    Elbow flexion            Elbow extension            Wrist flexion            Wrist extension            Wrist ulnar deviation            Wrist radial deviation            Wrist pronation            Wrist supination            (Blank rows = not tested) *=pain/symptoms  UPPER EXTREMITY MMT: NT due to post op status  MMT Right eval Left eval Left 05/11/24  Shoulder flexion   3 UT compensations   Shoulder extension     Shoulder abduction   3 UT compensations   Shoulder adduction     Shoulder internal rotation     Shoulder external rotation     Middle trapezius     Lower trapezius     Elbow flexion     Elbow extension     Wrist flexion     Wrist extension     Wrist ulnar deviation     Wrist radial deviation     Wrist pronation     Wrist supination     Grip strength (lbs)     (Blank rows = not tested) *=pain/symptoms    PALPATION:  LUE gross edema   TODAY'S TREATMENT:  DATE:  06/01/24 Pulleys x5 min STM and trigger point release to L upper trap, deltoid, and  bicep  Grade II-III posterior and inferior mobilizations at L shoulder  PROM to L shoulder Shoulder flexion, abduction, ER/IR, extension isometric contractions x10 each with 3 second holds  Shoulder flexion in supine with dowel x10 Shoulder flexion ladder x10  05/29/24 STM and trigger point release to L upper trap, deltoid, and bicep  Grade II-III posterior and inferior mobilizations at L shoulder  PROM to L shoulder AAROM shoulder flexion 3x10 Supine pec stretch 3x10 seconds   05/25/24 -Pulleys into Rt shoulder flexion - ROM with cane in front of mirror:  bil shoulder ext, IR sliding gently behind back, bil shoulder flexion, Lt scaption - row with scap squeeze 3 sec hold x 12, green band  - bow and arrow (midlevel row) with same side step back, green band x 10 each side - shoulder extension to neutral with scap squeeze with red band x 10 in semi-staggered stance - applied reg KTtape in zigzag pattern over Lt shoulder incision. Pt given verbal instructions on safe removal technique; verbalized understanding  05/22/24 STM and trigger point release to L upper trap, deltoid, and bicep  Massage gun to L upper trap, deltoid, and bicep  Grade II-III posterior and inferior mobilizations at L shoulder  PROM to L shoulder  05/18/24 STM and trigger point release to L upper trap, deltoid, and bicep. Grade II-III posterior and inferior mobilizations at shoulder  Pulleys x6 min  Serratus punch + full alphabet 3# x2 rounds supine Standing shoulder abduction 2x10 within range without shrugging shoulder  Shoulder flexion with GTB around hands for RC stability 2x10  05/15/24 Pulleys x6 minutes for w/u Wall ladder flexion x12 Scap retractions blue TB x15  Shoulder extensions red TB x15 Standing shoulder flexion to 90* 0# x10 Standing shoulder ABD to 70-80* 0# x10  Serratus punch 3# x12 supine  Serratus punch + CW/CCW circles 3# x15 each way  supine  Serratus punch + full alphabet 3# x2 rounds  supine  Wall ball x10 CW/CCW Thoracic extension stretch + shoulder flexion x10  Thoracic lateral flexion stretch x10 B Thoracic rotation stretch x10 B  05/11/24 Pulleys x6 minutes for w/u  ROM, MMT, goals for progress note, education on healing status given recent surgery/benefit of rest days and tapering activity, importance of reducing inflammation in the shoulder  Supine flexion AAROM with dowel x12 IR stretch with sheet to tolerance x12 L stretch at table 10x3 seconds  Supine shoulder flexion 1# 2x10- unable to tolerate 2# today  Held off on sidelying ABD and ER due to pain patterns today  Scap retractions blue TB x15  Shoulder extensions red TB x10   05/08/24 Wall ladder for flexion x15  Pulleys x4 minutes Gentle IR stretch with towel x10 L stretch at high table 10x3 seconds for shoulder flexion Supine shoulder flexion 2# 2x10 Sidelying shoulder ABD 0# 2x12 Supine serratus punches 2# x12 Supine serratus punch + CW/CCW circles x12 each 2# Supine serratus punch 2# alphabet x1  9/19 Manual: Grade II-III inferior glides in flexion, STM deltoid, tricep Supine AAROM flexion with 3# bar 2 x 10 flat table ABCs in flexion 3 sets 1# Sidelying ER 2 x 12 1#  Sidelying abduction 3x10  Standing functional IR strech with cane 10 x 10 second holds gentle Standing row GTB 1x15, progressed to BTB 1x15  05/02/24 Pulley flexion 4 minutes Supine AAROM flexion with 2# bar 2 x  10 flat table Manual: Grade II-III inferior glides in flexion, STM deltoid, tricep Perturbations in flexion 3 x 20 Supine SA punch 3# 2 x 15 Sidelying ER 1 x 12, 1# 1 x 12 Standing  functional IR strech with cane 10 x 10 second holds gentle Standing row GTB 2x 10 Wall ladder flexion 1 x 10  04/27/24 Pulley flexion 4 minutes Manual: Grade II-III inferior glides in flexion, STM deltoid, tricep Supine AAROM flexion with 1# bar 2 x 10 flat table, 30 degree incline 1# 1 x 10 Prone shoulder extension 1# 3 x 10 Prone  row 1# 3 x 10 Wall ladder flexion 1 x 10  PATIENT EDUCATION:  Education details: Patient educated on exam findings, POC, scope of PT, HEP, relevant anatomy/biomechanics, protocol/precautions. Person educated: Patient Education method: Explanation, Demonstration, and Handouts Education comprehension: verbalized understanding, returned demonstration, verbal cues required, and tactile cues required  HOME EXERCISE PROGRAM:  Access Code: [email or web address] URL: [email or web address] Date: 04/13/2024 Prepared by: Josette Rough  Exercises - Wrist AROM Flexion Extension  - 3 x daily - 7 x weekly - 2 sets - 10 reps - Seated Elbow Flexion and Extension AROM (Mirrored)  - 3 x daily - 7 x weekly - 2 sets - 10 reps - Circular Shoulder Pendulum with Table Support (Mirrored)  - 3 x daily - 7 x weekly - 2 sets - 10 reps - Seated Scapular Retraction  - 3 x daily - 7 x weekly - 2 sets - 10 reps - Seated Shoulder Flexion Towel Slide at Table Top (Mirrored)  - 3 x daily - 7 x weekly - 10 reps - 10 second hold - Supine Shoulder Flexion AAROM with Hands Clasped  - 3 x daily - 7 x weekly - 3 sets - 10 reps - Supine Shoulder External Rotation with Dowel  - 3 x daily - 7 x weekly - 2 sets - 10 reps - Standing Isometric Shoulder Flexion with Doorway - Arm Bent  - 1 x daily - 7 x weekly - 2 sets - 10 reps - 3 seconds  hold - Standing Isometric Shoulder Abduction with Doorway - Arm Bent  - 1 x daily - 7 x weekly - 2 sets - 10 reps - 3 seconds  hold - Standing Isometric Shoulder Extension with Doorway - Arm Bent  - 1 x daily - 7 x weekly - 2 sets - 10 reps - 3 seconds  hold - Standing Isometric Shoulder External Rotation with Doorway  - 1 x daily - 7 x weekly - 2 sets - 10 reps - 3 seconds  hold   ASSESSMENT:  CLINICAL IMPRESSION: Treatment today focused on alleviating pain and decreasing hyperactive musculature surrounding the shoulder. Patient demonstrate increased ROM and tolerates much more than previous  sessions. Exercises focused on isometric activation of deltoid musculature and RC muscles to decrease pain and increase tolerance to activity. Pain reported within 0-30 degrees of shoulder flexion but able to achieve 130 degrees on wall ladder. Educated on activity modification and continued use of heat at home. Will continue to benefit from therapy to address remaining limitations and return to prior level of function.   EVAL: Patient a 83 y.o. y.o. male who was seen today for physical therapy evaluation and treatment for L reverse TSA DOS 03/27/24. Patient presents with pain limited deficits in L shoulder strength, ROM, endurance, activity tolerance, and functional mobility with ADL. Patient is having to modify and restrict ADL as indicated by outcome measure score as  well as subjective information and objective measures which is affecting overall participation. Patient will benefit from skilled physical therapy in order to improve function and reduce impairment.  OBJECTIVE IMPAIRMENTS: decreased activity tolerance, decreased endurance, decreased mobility, decreased ROM, decreased strength, increased muscle spasms, impaired flexibility, impaired UE functional use, improper body mechanics, and pain  ACTIVITY LIMITATIONS:  carrying, lifting, bending, sleeping, bed mobility, bathing, dressing, reach over head, hygiene/grooming, and caring for others  PARTICIPATION LIMITATIONS:  meal prep, cleaning, laundry, driving, shopping, community activity, and yard work  PERSONAL FACTORS: Age, Time since onset of injury/illness/exacerbation, and 1-2 comorbidities: DM, HLD are also affecting patient's functional outcome.   REHAB POTENTIAL: Good  CLINICAL DECISION MAKING: Evolving/moderate complexity  EVALUATION COMPLEXITY: Moderate  GOALS: Goals reviewed with patient? Yes  SHORT TERM GOALS: Target date: 05/11/2024    Patient will be independent with HEP in order to improve functional  outcomes. Baseline: Goal status: IN PROGRESS 04/06/24  2.  Patient will report at least 25% improvement in symptoms for improved quality of life. Baseline:  Goal status: IN PROGRESS 04/20/24  3.  Patient will demonstrate Elevation to 130 deg and ER to 30 deg - passive, active assisted or active  Baseline:  Goal status: MET 04/20/24  4.  Patient will be out of sling per surgeon approval for improved functional use of L shoulder. Baseline:  Goal status: MET 04/13/24   LONG TERM GOALS: Target date: 06/22/2024    Patient will report at least 75% improvement in symptoms for improved quality of life. Baseline:  Goal status: IN PROGRESS 05/11/24  2.  Patient will improve UEFS score by at least 25 points in order to indicate improved tolerance to activity. Baseline:  Goal status: IN PROGRESS 05/11/24  3.  Patient will demonstrate at least PROM/AAROM/AROM to: Elevation - 145-160; ER - 40-50 ; functional IR to L1 in L shoulder for improved ability lift overhead. Baseline:  Goal status: IN PROGRESS 05/11/24  4.  Patient will be able to return to all activities unrestricted for improved ability to perform yard work functions and participate with family.  Baseline:  Goal status: IN PROGRESS 05/11/24  5.  Patient will demonstrate grade of 5/5 MMT grade in all tested musculature as evidence of improved strength to assist with lifting at home. Baseline:  Goal status: IN PROGRESS 05/11/24   PLAN:  PT FREQUENCY: 1-2x/week  PT DURATION: 12 weeks  PLANNED INTERVENTIONS:[id number]- PT Re-evaluation, 97110-Therapeutic exercises, 97530- Therapeutic activity, 97112- Neuromuscular re-education, 97535- Self Care, [id number]- Manual therapy, [phone]- Gait training, [phone]- Orthotic Fit/training, [phone]- Canalith repositioning, [id number]- Aquatic Therapy, [phone]- Splinting, [phone]- Wound care (first 20 sq cm), 97598- Wound care (each additional 20 sq cm)Patient/Family education, Balance training, Stair training, Taping, Dry  Needling, Joint mobilization, Joint manipulation, Spinal manipulation, Spinal mobilization, Scar mobilization, and DME instructions.  PLAN FOR NEXT SESSION: progress with reverse TSA protocol,  Continue ROM work but caution with intensity of strengthening, less than 5# and keep reps low to avoid acromial stress reaction per MD.   Lili Finder, Student PT 06/01/24 9:26 AM  This entire session was performed under direct supervision and direction of a licensed therapist/therapist assistant . I have personally read, edited and approve of the note as written. 9:35 AM, 06/01/24 Prentice CANDIE Stains PT, DPT Physical Therapist at [facility]

## 2024-06-03 ENCOUNTER — Ambulatory Visit: Payer: Self-pay | Admitting: Family Medicine

## 2024-06-05 ENCOUNTER — Ambulatory Visit (HOSPITAL_BASED_OUTPATIENT_CLINIC_OR_DEPARTMENT_OTHER): Admitting: Physical Therapy

## 2024-06-05 ENCOUNTER — Encounter (HOSPITAL_BASED_OUTPATIENT_CLINIC_OR_DEPARTMENT_OTHER): Payer: Self-pay | Admitting: Physical Therapy

## 2024-06-05 DIAGNOSIS — M25512 Pain in left shoulder: Secondary | ICD-10-CM

## 2024-06-05 DIAGNOSIS — R29898 Other symptoms and signs involving the musculoskeletal system: Secondary | ICD-10-CM | POA: Diagnosis not present

## 2024-06-05 DIAGNOSIS — M7542 Impingement syndrome of left shoulder: Secondary | ICD-10-CM

## 2024-06-05 DIAGNOSIS — M25612 Stiffness of left shoulder, not elsewhere classified: Secondary | ICD-10-CM

## 2024-06-05 DIAGNOSIS — M6281 Muscle weakness (generalized): Secondary | ICD-10-CM | POA: Diagnosis not present

## 2024-06-05 LAB — FRUCTOSAMINE: Fructosamine: 323 umol/L — ABNORMAL HIGH (ref 205–285)

## 2024-06-05 NOTE — Therapy (Addendum)
 OUTPATIENT PHYSICAL THERAPY SHOULDER TREATMENT NOTE   Patient Name: Greg Rogers. MRN: [id number] DOB:May 05, 1941, 83 y.o., male Today's Date: 06/05/2024  END OF SESSION:  PT End of Session - 06/05/24 0907     Visit Number 16    Number of Visits 24    Date for Recertification  06/22/24    Authorization Type MCR    Progress Note Due on Visit 20    PT Start Time 0846    PT Stop Time 0926    PT Time Calculation (min) 40 min    Activity Tolerance Patient tolerated treatment well    Behavior During Therapy [facility] for tasks assessed/performed         Past Medical History:  Diagnosis Date   Abnormal GGT test    With otherwise normal work up.   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)    Persistent disorder of initiating or maintaining sleep    Pes planus    Rosacea    Snoring    Past Surgical History:  Procedure Laterality Date   APPENDECTOMY  1949   at age 40   CERVICAL FUSION  03/2016   C 3&4   Cyst removed from breast  1960   FOOT SURGERY  01/2009   Left   HERNIA REPAIR  1956   at age 10 (single)   HERNIA REPAIR  1960   (double)   Left 2nd toe removed  03/2017   REVERSE SHOULDER ARTHROPLASTY Left 03/27/2024   Procedure: ARTHROPLASTY, SHOULDER, TOTAL, REVERSE;  Surgeon: Genelle Standing, MD;  Location: [facility];  Service: Orthopedics;  Laterality: Left;   SPINE SURGERY     TONSILLECTOMY AND ADENOIDECTOMY  ~ 1950   VASECTOMY  ~ 1984   Patient Active Problem List   Diagnosis Date Noted   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 03/27/2024   Leg pain 03/21/2024   Shoulder pain 12/07/2023   Change in stool 11/02/2023   Nasal lesion 06/02/2023   Occipital neuralgia 10/12/2020   Wound of right leg, initial encounter  07/03/2019   Healthcare maintenance 03/04/2017   Foot pain 07/22/2016   Cervical stenosis of spine 01/06/2016   thyroid  nodule-benign path on bx 01/2016 01/06/2016   Radicular pain in right arm 12/22/2015   Radicular leg pain 12/22/2015   Back pain 07/06/2015   Constipation 07/06/2015   FH: CAD (coronary artery disease) 05/23/2015   Family history of ischemic heart disease and other diseases of the circulatory system 05/23/2015   Advance care planning 11/26/2014   Pain in joint, shoulder region 11/26/2014   Medicare annual wellness visit, subsequent 09/27/2012   GERD (gastroesophageal reflux disease) 07/05/2012   Irritability 03/29/2011   Advance directive on file 01/09/2011   Pain in joint, other site 01/04/2011   Arthropathy 09/02/2010   NEPHROLITHIASIS, HX OF 09/02/2010   Diabetes mellitus without complication (HCC) 09/01/2010   HLD (hyperlipidemia) 10/24/2009   SNORING 10/24/2009   FOOT PAIN, BILATERAL 10/06/2009   Pes planus 10/06/2009   Acquired flat foot 10/06/2009    PCP: Cleatus Arlyss RAMAN, MD  REFERRING PROVIDER: Genelle Standing, MD  REFERRING DIAG: S46.012A (ICD-10-CM) - 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, initial  encounter;  1. Left shoulder reverse shoulder arthroplasty 2. Left shoulder biceps tenodesis  THERAPY DIAG:  Acute pain of left shoulder  Muscle weakness (generalized)  Stiffness of left shoulder, not elsewhere classified  Other symptoms and signs involving the musculoskeletal system  Impingement syndrome of left shoulder  Rationale for Evaluation and Treatment: Rehabilitation  ONSET DATE: DOS 03/27/24  SUBJECTIVE:                                                                                                                                                                                      SUBJECTIVE STATEMENT: Reports that he is continuing to improve. Noticed he's been able to do more over the last few days.   EVAL: Patient states  shoulder has been doing good. Using sling. Was very active prior to injury/surgery with gardening/yard work Hand dominance: Right  PERTINENT HISTORY: DM, HLD, chronic shoulder pain with fall in March while exiting ski lift with RC tear  PAIN:  Are you having pain? Yes 2/10 Location: anterior Lt shoulder Description: dull, uncomfortable      PRECAUTIONS: Other: L reverse TSA   RED FLAGS: None   WEIGHT BEARING RESTRICTIONS: NWB LUE at eval  FALLS:  Has patient fallen in last 6 months? Yes. Number of falls 1  PLOF: Independent  PATIENT GOALS:regain use of the arm  OBJECTIVE: (objective measures from initial evaluation unless otherwise dated)  OBSERVATION: using sling, waterproof dressing intact  PATIENT SURVEYS:  UEFS  Extreme difficulty/unable (0), Quite a bit of difficulty (1), Moderate difficulty (2), Little difficulty (3), No difficulty (4) Survey date:  03/30/24  Any of your usual work, household or school activities 0  2. Your usual hobbies, recreational/sport activities 1   3. Lifting a bag of groceries to waist level 2   4. Lifting a bag of groceries above your head 0  5. Grooming your hair 4  6. Pushing up on your hands (I.e. from bathtub or chair) 0  7. Preparing food (I.e. peeling/cutting) 1  8. Driving  3  9. Vacuuming, sweeping, or raking 4  10. Dressing  3  11. Doing up buttons 2  12. Using tools/appliances 1  13. Opening doors 0  14. Cleaning  3  15. Tying or lacing shoes 2  16. Sleeping  3  17. Laundering clothes (I.e. washing, ironing, folding) 3  18. Opening a jar 3  19. Throwing a ball 1  20. Carrying a small suitcase with your affected limb.  1  Score total:  35/80     05/08/24- UEFS 15/80 via online version, later noted questions were different than one in epic   COGNITION: Overall cognitive  status: Within functional limits for tasks assessed     SENSATION: WFL  POSTURE: rounded shoulders and forward head   UPPER EXTREMITY ROM: AROM  RUE WFL  Passive ROM Left eval L 04/06/24 L 04/13/24 L 04/20/24 L 04/24/24 L 04/27/24 L 9/17 L  9/19 L 9/26 L 06/01/24 L 06/05/24  Shoulder flexion 100 PROM 110 AAROM 110* PROM, 120* AAROM supine  120-130* PROM (bit guarded today) 130* PROM and AROM  114 AROM 140 AAROM 145 AAROM 138 AAROM Improves to 149 AAROM 139 AAROM 148 AAROM improves to 153 AAROM 144* AAROM  130 degrees on shoulder flexion ladder  120 before manual, 135 after in supine, 140 on shoulder flexion ladder   Shoulder extension             Shoulder abduction             Shoulder adduction             Shoulder internal rotation         Mid glute. L5 at best with IR stretch     Shoulder external rotation 20 25* next to body PROM setaed (very guarded), 30* AAROM seated  30* PROM arm next to body  30* PROM arm next to body      30* PROM/AAROM supine arm next to body but limited by anterior shoulder pain     Elbow flexion             Elbow extension             Wrist flexion             Wrist extension             Wrist ulnar deviation             Wrist radial deviation             Wrist pronation             Wrist supination             (Blank rows = not tested) *=pain/symptoms  UPPER EXTREMITY MMT: NT due to post op status  MMT Right eval Left eval Left 05/11/24  Shoulder flexion   3 UT compensations   Shoulder extension     Shoulder abduction   3 UT compensations   Shoulder adduction     Shoulder internal rotation     Shoulder external rotation     Middle trapezius     Lower trapezius     Elbow flexion     Elbow extension     Wrist flexion     Wrist extension     Wrist ulnar deviation     Wrist radial deviation     Wrist pronation     Wrist supination     Grip strength (lbs)     (Blank rows = not tested) *=pain/symptoms    PALPATION:  LUE gross edema   TODAY'S TREATMENT:  DATE:  06/05/24 STM and trigger point release to L upper trap, deltoid, and bicep  Grade II-III posterior and inferior mobilizations at L shoulder  Shoulder flexion in supine with dowel 2x10  Shoulder ABCs x2 SL shoulder abduction 2x10  Shoulder IR with dowel behind back 2x10 Shoulder flexion ladder 2x10  06/01/24 Pulleys x5 min STM and trigger point release to L upper trap, deltoid, and bicep  Grade II-III posterior and inferior mobilizations at L shoulder  PROM to L shoulder Shoulder flexion, abduction, ER/IR, extension isometric contractions x10 each with 3 second holds  Shoulder flexion in supine with dowel x10 Shoulder flexion ladder x10   05/29/24 STM and trigger point release to L upper trap, deltoid, and bicep  Grade II-III posterior and inferior mobilizations at L shoulder  PROM to L shoulder AAROM shoulder flexion 3x10 Supine pec stretch 3x10 seconds   05/25/24 -Pulleys into Rt shoulder flexion - ROM with cane in front of mirror:  bil shoulder ext, IR sliding gently behind back, bil shoulder flexion, Lt scaption - row with scap squeeze 3 sec hold x 12, green band  - bow and arrow (midlevel row) with same side step back, green band x 10 each side - shoulder extension to neutral with scap squeeze with red band x 10 in semi-staggered stance - applied reg KTtape in zigzag pattern over Lt shoulder incision. Pt given verbal instructions on safe removal technique; verbalized understanding  05/22/24 STM and trigger point release to L upper trap, deltoid, and bicep  Massage gun to L upper trap, deltoid, and bicep  Grade II-III posterior and inferior mobilizations at L shoulder  PROM to L shoulder  05/18/24 STM and trigger point release to L upper trap, deltoid, and bicep. Grade II-III posterior and inferior mobilizations at shoulder  Pulleys x6 min  Serratus punch + full alphabet 3# x2 rounds supine Standing shoulder abduction 2x10 within range without shrugging shoulder   Shoulder flexion with GTB around hands for RC stability 2x10  05/15/24 Pulleys x6 minutes for w/u Wall ladder flexion x12 Scap retractions blue TB x15  Shoulder extensions red TB x15 Standing shoulder flexion to 90* 0# x10 Standing shoulder ABD to 70-80* 0# x10  Serratus punch 3# x12 supine  Serratus punch + CW/CCW circles 3# x15 each way  supine  Serratus punch + full alphabet 3# x2 rounds supine  Wall ball x10 CW/CCW Thoracic extension stretch + shoulder flexion x10  Thoracic lateral flexion stretch x10 B Thoracic rotation stretch x10 B  05/11/24 Pulleys x6 minutes for w/u  ROM, MMT, goals for progress note, education on healing status given recent surgery/benefit of rest days and tapering activity, importance of reducing inflammation in the shoulder  Supine flexion AAROM with dowel x12 IR stretch with sheet to tolerance x12 L stretch at table 10x3 seconds  Supine shoulder flexion 1# 2x10- unable to tolerate 2# today  Held off on sidelying ABD and ER due to pain patterns today  Scap retractions blue TB x15  Shoulder extensions red TB x10   05/08/24 Wall ladder for flexion x15  Pulleys x4 minutes Gentle IR stretch with towel x10 L stretch at high table 10x3 seconds for shoulder flexion Supine shoulder flexion 2# 2x10 Sidelying shoulder ABD 0# 2x12 Supine serratus punches 2# x12 Supine serratus punch + CW/CCW circles x12 each 2# Supine serratus punch 2# alphabet x1  9/19 Manual: Grade II-III inferior glides in flexion, STM deltoid, tricep Supine AAROM flexion with 3# bar 2 x 10 flat  table ABCs in flexion 3 sets 1# Sidelying ER 2 x 12 1#  Sidelying abduction 3x10  Standing functional IR strech with cane 10 x 10 second holds gentle Standing row GTB 1x15, progressed to BTB 1x15  05/02/24 Pulley flexion 4 minutes Supine AAROM flexion with 2# bar 2 x 10 flat table Manual: Grade II-III inferior glides in flexion, STM deltoid, tricep Perturbations in flexion 3 x  20 Supine SA punch 3# 2 x 15 Sidelying ER 1 x 12, 1# 1 x 12 Standing  functional IR strech with cane 10 x 10 second holds gentle Standing row GTB 2x 10 Wall ladder flexion 1 x 10  04/27/24 Pulley flexion 4 minutes Manual: Grade II-III inferior glides in flexion, STM deltoid, tricep Supine AAROM flexion with 1# bar 2 x 10 flat table, 30 degree incline 1# 1 x 10 Prone shoulder extension 1# 3 x 10 Prone row 1# 3 x 10 Wall ladder flexion 1 x 10  PATIENT EDUCATION:  Education details: Patient educated on exam findings, POC, scope of PT, HEP, relevant anatomy/biomechanics, protocol/precautions. Person educated: Patient Education method: Explanation, Demonstration, and Handouts Education comprehension: verbalized understanding, returned demonstration, verbal cues required, and tactile cues required  HOME EXERCISE PROGRAM:  Access Code: [email or web address] URL: [email or web address] Date: 04/13/2024 Prepared by: Josette Rough  Exercises - Wrist AROM Flexion Extension  - 3 x daily - 7 x weekly - 2 sets - 10 reps - Seated Elbow Flexion and Extension AROM (Mirrored)  - 3 x daily - 7 x weekly - 2 sets - 10 reps - Circular Shoulder Pendulum with Table Support (Mirrored)  - 3 x daily - 7 x weekly - 2 sets - 10 reps - Seated Scapular Retraction  - 3 x daily - 7 x weekly - 2 sets - 10 reps - Seated Shoulder Flexion Towel Slide at Table Top (Mirrored)  - 3 x daily - 7 x weekly - 10 reps - 10 second hold - Supine Shoulder Flexion AAROM with Hands Clasped  - 3 x daily - 7 x weekly - 3 sets - 10 reps - Supine Shoulder External Rotation with Dowel  - 3 x daily - 7 x weekly - 2 sets - 10 reps - Standing Isometric Shoulder Flexion with Doorway - Arm Bent  - 1 x daily - 7 x weekly - 2 sets - 10 reps - 3 seconds  hold - Standing Isometric Shoulder Abduction with Doorway - Arm Bent  - 1 x daily - 7 x weekly - 2 sets - 10 reps - 3 seconds  hold - Standing Isometric Shoulder Extension with Doorway - Arm  Bent  - 1 x daily - 7 x weekly - 2 sets - 10 reps - 3 seconds  hold - Standing Isometric Shoulder External Rotation with Doorway  - 1 x daily - 7 x weekly - 2 sets - 10 reps - 3 seconds  hold   ASSESSMENT:  CLINICAL IMPRESSION: Tolerated exercises well today and is continuing to progress. Manual therapy utilized to decrease hyperactive musculature and improve pain. Exercises focused on progressing from previous session within tolerance to avoid flare up. Educated on relevant anatomy and expected soreness tomorrow. Verbal cueing throughout to decrease shoulder hike and compensation with upper trap. Will continue to benefit from skilled therapy to address remaining limitations and improve to PLOF.    EVAL: Patient a 83 y.o. y.o. male who was seen today for physical therapy evaluation and treatment for L reverse TSA DOS 03/27/24. Patient presents  with pain limited deficits in L shoulder strength, ROM, endurance, activity tolerance, and functional mobility with ADL. Patient is having to modify and restrict ADL as indicated by outcome measure score as well as subjective information and objective measures which is affecting overall participation. Patient will benefit from skilled physical therapy in order to improve function and reduce impairment.  OBJECTIVE IMPAIRMENTS: decreased activity tolerance, decreased endurance, decreased mobility, decreased ROM, decreased strength, increased muscle spasms, impaired flexibility, impaired UE functional use, improper body mechanics, and pain  ACTIVITY LIMITATIONS:  carrying, lifting, bending, sleeping, bed mobility, bathing, dressing, reach over head, hygiene/grooming, and caring for others  PARTICIPATION LIMITATIONS:  meal prep, cleaning, laundry, driving, shopping, community activity, and yard work  PERSONAL FACTORS: Age, Time since onset of injury/illness/exacerbation, and 1-2 comorbidities: DM, HLD are also affecting patient's functional outcome.   REHAB  POTENTIAL: Good  CLINICAL DECISION MAKING: Evolving/moderate complexity  EVALUATION COMPLEXITY: Moderate  GOALS: Goals reviewed with patient? Yes  SHORT TERM GOALS: Target date: 05/11/2024    Patient will be independent with HEP in order to improve functional outcomes. Baseline: Goal status: IN PROGRESS 04/06/24  2.  Patient will report at least 25% improvement in symptoms for improved quality of life. Baseline:  Goal status: IN PROGRESS 04/20/24  3.  Patient will demonstrate Elevation to 130 deg and ER to 30 deg - passive, active assisted or active  Baseline:  Goal status: MET 04/20/24  4.  Patient will be out of sling per surgeon approval for improved functional use of L shoulder. Baseline:  Goal status: MET 04/13/24   LONG TERM GOALS: Target date: 06/22/2024    Patient will report at least 75% improvement in symptoms for improved quality of life. Baseline:  Goal status: IN PROGRESS 05/11/24  2.  Patient will improve UEFS score by at least 25 points in order to indicate improved tolerance to activity. Baseline:  Goal status: IN PROGRESS 05/11/24  3.  Patient will demonstrate at least PROM/AAROM/AROM to: Elevation - 145-160; ER - 40-50 ; functional IR to L1 in L shoulder for improved ability lift overhead. Baseline:  Goal status: IN PROGRESS 05/11/24  4.  Patient will be able to return to all activities unrestricted for improved ability to perform yard work functions and participate with family.  Baseline:  Goal status: IN PROGRESS 05/11/24  5.  Patient will demonstrate grade of 5/5 MMT grade in all tested musculature as evidence of improved strength to assist with lifting at home. Baseline:  Goal status: IN PROGRESS 05/11/24   PLAN:  PT FREQUENCY: 1-2x/week  PT DURATION: 12 weeks  PLANNED INTERVENTIONS:[id number]- PT Re-evaluation, 97110-Therapeutic exercises, 97530- Therapeutic activity, 97112- Neuromuscular re-education, 97535- Self Care, [id number]- Manual therapy, [phone]-  Gait training, [phone]- Orthotic Fit/training, [phone]- Canalith repositioning, [id number]- Aquatic Therapy, [phone]- Splinting, [phone]- Wound care (first 20 sq cm), 97598- Wound care (each additional 20 sq cm)Patient/Family education, Balance training, Stair training, Taping, Dry Needling, Joint mobilization, Joint manipulation, Spinal manipulation, Spinal mobilization, Scar mobilization, and DME instructions.  PLAN FOR NEXT SESSION: progress with reverse TSA protocol,  Continue ROM work but caution with intensity of strengthening, less than 5# and keep reps low to avoid acromial stress reaction per MD.   Lili Finder, Student PT 06/05/24 9:27 AM  This entire session was performed under direct supervision and direction of a licensed therapist/therapist assistant . I have personally read, edited and approve of the note as written. 9:45 AM, 06/05/24 Prentice CANDIE Stains PT, DPT Physical Therapist at [facility]

## 2024-06-08 ENCOUNTER — Ambulatory Visit (HOSPITAL_BASED_OUTPATIENT_CLINIC_OR_DEPARTMENT_OTHER): Admitting: Physical Therapy

## 2024-06-08 ENCOUNTER — Encounter (HOSPITAL_BASED_OUTPATIENT_CLINIC_OR_DEPARTMENT_OTHER): Payer: Self-pay | Admitting: Physical Therapy

## 2024-06-08 DIAGNOSIS — M25512 Pain in left shoulder: Secondary | ICD-10-CM

## 2024-06-08 DIAGNOSIS — R29898 Other symptoms and signs involving the musculoskeletal system: Secondary | ICD-10-CM | POA: Diagnosis not present

## 2024-06-08 DIAGNOSIS — M6281 Muscle weakness (generalized): Secondary | ICD-10-CM

## 2024-06-08 DIAGNOSIS — M7542 Impingement syndrome of left shoulder: Secondary | ICD-10-CM | POA: Diagnosis not present

## 2024-06-08 DIAGNOSIS — M25612 Stiffness of left shoulder, not elsewhere classified: Secondary | ICD-10-CM | POA: Diagnosis not present

## 2024-06-08 NOTE — Therapy (Addendum)
 OUTPATIENT PHYSICAL THERAPY SHOULDER TREATMENT NOTE   Patient Name: Greg Rogers. MRN: [id number] DOB:20-May-1941, 83 y.o., male Today's Date: 06/08/2024  END OF SESSION:  PT End of Session - 06/08/24 0844     Visit Number 17    Number of Visits 24    Date for Recertification  06/22/24    Authorization Type MCR    Progress Note Due on Visit 20    PT Start Time 0845    PT Stop Time 0927    PT Time Calculation (min) 42 min    Activity Tolerance Patient tolerated treatment well    Behavior During Therapy WFL for tasks assessed/performed         Past Medical History:  Diagnosis Date   Abnormal GGT test    With otherwise normal work up.   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)    Persistent disorder of initiating or maintaining sleep    Pes planus    Rosacea    Snoring    Past Surgical History:  Procedure Laterality Date   APPENDECTOMY  1949   at age 49   CERVICAL FUSION  03/2016   C 3&4   Cyst removed from breast  1960   FOOT SURGERY  01/2009   Left   HERNIA REPAIR  1956   at age 89 (single)   HERNIA REPAIR  1960   (double)   Left 2nd toe removed  03/2017   REVERSE SHOULDER ARTHROPLASTY Left 03/27/2024   Procedure: ARTHROPLASTY, SHOULDER, TOTAL, REVERSE;  Surgeon: Genelle Standing, MD;  Location: [facility];  Service: Orthopedics;  Laterality: Left;   SPINE SURGERY     TONSILLECTOMY AND ADENOIDECTOMY  ~ 1950   VASECTOMY  ~ 1984   Patient Active Problem List   Diagnosis Date Noted   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 03/27/2024   Leg pain 03/21/2024   Shoulder pain 12/07/2023   Change in stool 11/02/2023   Nasal lesion 06/02/2023   Occipital neuralgia 10/12/2020   Wound of right leg, initial encounter  07/03/2019   Healthcare maintenance 03/04/2017   Foot pain 07/22/2016   Cervical stenosis of spine 01/06/2016   thyroid  nodule-benign path on bx 01/2016 01/06/2016   Radicular pain in right arm 12/22/2015   Radicular leg pain 12/22/2015   Back pain 07/06/2015   Constipation 07/06/2015   FH: CAD (coronary artery disease) 05/23/2015   Family history of ischemic heart disease and other diseases of the circulatory system 05/23/2015   Advance care planning 11/26/2014   Pain in joint, shoulder region 11/26/2014   Medicare annual wellness visit, subsequent 09/27/2012   GERD (gastroesophageal reflux disease) 07/05/2012   Irritability 03/29/2011   Advance directive on file 01/09/2011   Pain in joint, other site 01/04/2011   Arthropathy 09/02/2010   NEPHROLITHIASIS, HX OF 09/02/2010   Diabetes mellitus without complication (HCC) 09/01/2010   HLD (hyperlipidemia) 10/24/2009   SNORING 10/24/2009   FOOT PAIN, BILATERAL 10/06/2009   Pes planus 10/06/2009   Acquired flat foot 10/06/2009    PCP: Cleatus Arlyss RAMAN, MD  REFERRING PROVIDER: Genelle Standing, MD  REFERRING DIAG: S46.012A (ICD-10-CM) - 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, initial  encounter;  1. Left shoulder reverse shoulder arthroplasty 2. Left shoulder biceps tenodesis  THERAPY DIAG:  Acute pain of left shoulder  Muscle weakness (generalized)  Stiffness of left shoulder, not elsewhere classified  Other symptoms and signs involving the musculoskeletal system  Rationale for Evaluation and Treatment: Rehabilitation  ONSET DATE: DOS 03/27/24  SUBJECTIVE:                                                                                                                                                                                      SUBJECTIVE STATEMENT: Reports he is a little stiff today from getting ready for his trip to [place] . Continuing to improve and wasn't in any pain after last session.  EVAL: Patient states  shoulder has been doing good. Using sling. Was very active prior to injury/surgery with gardening/yard work Hand dominance: Right  PERTINENT HISTORY: DM, HLD, chronic shoulder pain with fall in March while exiting ski lift with RC tear  PAIN:  Are you having pain? Yes 2/10 Location: anterior Lt shoulder Description: dull, uncomfortable      PRECAUTIONS: Other: L reverse TSA   RED FLAGS: None   WEIGHT BEARING RESTRICTIONS: NWB LUE at eval  FALLS:  Has patient fallen in last 6 months? Yes. Number of falls 1  PLOF: Independent  PATIENT GOALS:regain use of the arm  OBJECTIVE: (objective measures from initial evaluation unless otherwise dated)  OBSERVATION: using sling, waterproof dressing intact  PATIENT SURVEYS:  UEFS  Extreme difficulty/unable (0), Quite a bit of difficulty (1), Moderate difficulty (2), Little difficulty (3), No difficulty (4) Survey date:  03/30/24  Any of your usual work, household or school activities 0  2. Your usual hobbies, recreational/sport activities 1   3. Lifting a bag of groceries to waist level 2   4. Lifting a bag of groceries above your head 0  5. Grooming your hair 4  6. Pushing up on your hands (I.e. from bathtub or chair) 0  7. Preparing food (I.e. peeling/cutting) 1  8. Driving  3  9. Vacuuming, sweeping, or raking 4  10. Dressing  3  11. Doing up buttons 2  12. Using tools/appliances 1  13. Opening doors 0  14. Cleaning  3  15. Tying or lacing shoes 2  16. Sleeping  3  17. Laundering clothes (I.e. washing, ironing, folding) 3  18. Opening a jar 3  19. Throwing a ball 1  20. Carrying a small suitcase with your affected limb.  1  Score total:  35/80     05/08/24- UEFS 15/80 via online version, later noted questions were different than one in epic   COGNITION: Overall cognitive  status: Within functional limits for tasks assessed     SENSATION: WFL  POSTURE: rounded shoulders and forward head   UPPER EXTREMITY ROM: AROM  RUE WFL  Passive ROM Left eval L 04/06/24 L 04/13/24 L 04/20/24 L 04/24/24 L 04/27/24 L 9/17 L  9/19 L 9/26 L 06/01/24 L 06/05/24 L 06/07/24  Shoulder flexion 100 PROM 110 AAROM 110* PROM, 120* AAROM supine  120-130* PROM (bit guarded today) 130* PROM and AROM  114 AROM 140 AAROM 145 AAROM 138 AAROM Improves to 149 AAROM 139 AAROM 148 AAROM improves to 153 AAROM 144* AAROM  130 degrees on shoulder flexion ladder  120 before manual, 135 after in supine, 140 on shoulder flexion ladder  135 deg before manual 140   Shoulder extension              Shoulder abduction              Shoulder adduction              Shoulder internal rotation         Mid glute. L5 at best with IR stretch      Shoulder external rotation 20 25* next to body PROM setaed (very guarded), 30* AAROM seated  30* PROM arm next to body  30* PROM arm next to body      30* PROM/AAROM supine arm next to body but limited by anterior shoulder pain      Elbow flexion              Elbow extension              Wrist flexion              Wrist extension              Wrist ulnar deviation              Wrist radial deviation              Wrist pronation              Wrist supination              (Blank rows = not tested) *=pain/symptoms  UPPER EXTREMITY MMT: NT due to post op status  MMT Right eval Left eval Left 05/11/24  Shoulder flexion   3 UT compensations   Shoulder extension     Shoulder abduction   3 UT compensations   Shoulder adduction     Shoulder internal rotation     Shoulder external rotation     Middle trapezius     Lower trapezius     Elbow flexion     Elbow extension     Wrist flexion     Wrist extension     Wrist ulnar deviation     Wrist radial deviation     Wrist pronation     Wrist supination     Grip strength (lbs)     (Blank rows = not tested) *=pain/symptoms    PALPATION:  LUE gross edema   TODAY'S TREATMENT:  DATE:  06/07/24 Shoulder flexion pulleys x5 min  Posterior/inferior GHJ glides grade II-III L shoulder PROM into flexion/ER  STM to anterior deltoid, biceps, lateral deltoid, and UT Shoulder flexion with dowel x10 SL shoulder abduction with bent elbow 2x10 SL shoulder ER 2x10 Shoulder flexion, abduction, and ER in mirror x5 each  Shoulder flexion circles on ball on wall 2x10  06/05/24 STM and trigger point release to L upper trap, deltoid, and bicep  Grade II-III posterior and inferior mobilizations at L shoulder  Shoulder flexion in supine with dowel 2x10  Shoulder ABCs x2 SL shoulder abduction 2x10  Shoulder IR with dowel behind back 2x10 Shoulder flexion ladder 2x10  06/01/24 Pulleys x5 min STM and trigger point release to L upper trap, deltoid, and bicep  Grade II-III posterior and inferior mobilizations at L shoulder  PROM to L shoulder Shoulder flexion, abduction, ER/IR, extension isometric contractions x10 each with 3 second holds  Shoulder flexion in supine with dowel x10 Shoulder flexion ladder x10   05/29/24 STM and trigger point release to L upper trap, deltoid, and bicep  Grade II-III posterior and inferior mobilizations at L shoulder  PROM to L shoulder AAROM shoulder flexion 3x10 Supine pec stretch 3x10 seconds   05/25/24 -Pulleys into Rt shoulder flexion - ROM with cane in front of mirror:  bil shoulder ext, IR sliding gently behind back, bil shoulder flexion, Lt scaption - row with scap squeeze 3 sec hold x 12, green band  - bow and arrow (midlevel row) with same side step back, green band x 10 each side - shoulder extension to neutral with scap squeeze with red band x 10 in semi-staggered stance - applied reg KTtape in zigzag pattern over Lt shoulder incision. Pt given verbal instructions on safe removal technique; verbalized understanding  05/22/24 STM and trigger point release to L upper trap,  deltoid, and bicep  Massage gun to L upper trap, deltoid, and bicep  Grade II-III posterior and inferior mobilizations at L shoulder  PROM to L shoulder  05/18/24 STM and trigger point release to L upper trap, deltoid, and bicep. Grade II-III posterior and inferior mobilizations at shoulder  Pulleys x6 min  Serratus punch + full alphabet 3# x2 rounds supine Standing shoulder abduction 2x10 within range without shrugging shoulder  Shoulder flexion with GTB around hands for RC stability 2x10  05/15/24 Pulleys x6 minutes for w/u Wall ladder flexion x12 Scap retractions blue TB x15  Shoulder extensions red TB x15 Standing shoulder flexion to 90* 0# x10 Standing shoulder ABD to 70-80* 0# x10  Serratus punch 3# x12 supine  Serratus punch + CW/CCW circles 3# x15 each way  supine  Serratus punch + full alphabet 3# x2 rounds supine  Wall ball x10 CW/CCW Thoracic extension stretch + shoulder flexion x10  Thoracic lateral flexion stretch x10 B Thoracic rotation stretch x10 B  05/11/24 Pulleys x6 minutes for w/u  ROM, MMT, goals for progress note, education on healing status given recent surgery/benefit of rest days and tapering activity, importance of reducing inflammation in the shoulder  Supine flexion AAROM with dowel x12 IR stretch with sheet to tolerance x12 L stretch at table 10x3 seconds  Supine shoulder flexion 1# 2x10- unable to tolerate 2# today  Held off on sidelying ABD and ER due to pain patterns today  Scap retractions blue TB x15  Shoulder extensions red TB x10   05/08/24 Wall ladder for flexion x15  Pulleys x4 minutes Gentle IR stretch with towel x10  L stretch at high table 10x3 seconds for shoulder flexion Supine shoulder flexion 2# 2x10 Sidelying shoulder ABD 0# 2x12 Supine serratus punches 2# x12 Supine serratus punch + CW/CCW circles x12 each 2# Supine serratus punch 2# alphabet x1  9/19 Manual: Grade II-III inferior glides in flexion, STM deltoid,  tricep Supine AAROM flexion with 3# bar 2 x 10 flat table ABCs in flexion 3 sets 1# Sidelying ER 2 x 12 1#  Sidelying abduction 3x10  Standing functional IR strech with cane 10 x 10 second holds gentle Standing row GTB 1x15, progressed to BTB 1x15  05/02/24 Pulley flexion 4 minutes Supine AAROM flexion with 2# bar 2 x 10 flat table Manual: Grade II-III inferior glides in flexion, STM deltoid, tricep Perturbations in flexion 3 x 20 Supine SA punch 3# 2 x 15 Sidelying ER 1 x 12, 1# 1 x 12 Standing  functional IR strech with cane 10 x 10 second holds gentle Standing row GTB 2x 10 Wall ladder flexion 1 x 10  04/27/24 Pulley flexion 4 minutes Manual: Grade II-III inferior glides in flexion, STM deltoid, tricep Supine AAROM flexion with 1# bar 2 x 10 flat table, 30 degree incline 1# 1 x 10 Prone shoulder extension 1# 3 x 10 Prone row 1# 3 x 10 Wall ladder flexion 1 x 10  PATIENT EDUCATION:  Education details: Patient educated on exam findings, POC, scope of PT, HEP, relevant anatomy/biomechanics, protocol/precautions. Person educated: Patient Education method: Explanation, Demonstration, and Handouts Education comprehension: verbalized understanding, returned demonstration, verbal cues required, and tactile cues required  HOME EXERCISE PROGRAM:  Access Code: [email or web address] URL: [email or web address] Date: 04/13/2024 Prepared by: Josette Rough  Exercises - Wrist AROM Flexion Extension  - 3 x daily - 7 x weekly - 2 sets - 10 reps - Seated Elbow Flexion and Extension AROM (Mirrored)  - 3 x daily - 7 x weekly - 2 sets - 10 reps - Circular Shoulder Pendulum with Table Support (Mirrored)  - 3 x daily - 7 x weekly - 2 sets - 10 reps - Seated Scapular Retraction  - 3 x daily - 7 x weekly - 2 sets - 10 reps - Seated Shoulder Flexion Towel Slide at Table Top (Mirrored)  - 3 x daily - 7 x weekly - 10 reps - 10 second hold - Supine Shoulder Flexion AAROM with Hands Clasped  - 3 x  daily - 7 x weekly - 3 sets - 10 reps - Supine Shoulder External Rotation with Dowel  - 3 x daily - 7 x weekly - 2 sets - 10 reps - Standing Isometric Shoulder Flexion with Doorway - Arm Bent  - 1 x daily - 7 x weekly - 2 sets - 10 reps - 3 seconds  hold - Standing Isometric Shoulder Abduction with Doorway - Arm Bent  - 1 x daily - 7 x weekly - 2 sets - 10 reps - 3 seconds  hold - Standing Isometric Shoulder Extension with Doorway - Arm Bent  - 1 x daily - 7 x weekly - 2 sets - 10 reps - 3 seconds  hold - Standing Isometric Shoulder External Rotation with Doorway  - 1 x daily - 7 x weekly - 2 sets - 10 reps - 3 seconds  hold   ASSESSMENT:  CLINICAL IMPRESSION: Tolerated exercises well today. Manual therapy utilized to decrease hyperactive musculature in shoulder and increase mobility. Went from 135 degrees of shoulder flexion to 140 degrees after manual. Exercises focused on shoulder ROM  and stabilization to improve activity tolerance. Utilized Banker to show shoulder hiking. Demonstrated difficulty with exercises activating deltoid and increased UT use. Educated on expected soreness tomorrow. Updated HEP. Will continue to benefit from therapy to address remaining limitations.   EVAL: Patient a 83 y.o. y.o. male who was seen today for physical therapy evaluation and treatment for L reverse TSA DOS 03/27/24. Patient presents with pain limited deficits in L shoulder strength, ROM, endurance, activity tolerance, and functional mobility with ADL. Patient is having to modify and restrict ADL as indicated by outcome measure score as well as subjective information and objective measures which is affecting overall participation. Patient will benefit from skilled physical therapy in order to improve function and reduce impairment.  OBJECTIVE IMPAIRMENTS: decreased activity tolerance, decreased endurance, decreased mobility, decreased ROM, decreased strength, increased muscle spasms, impaired  flexibility, impaired UE functional use, improper body mechanics, and pain  ACTIVITY LIMITATIONS:  carrying, lifting, bending, sleeping, bed mobility, bathing, dressing, reach over head, hygiene/grooming, and caring for others  PARTICIPATION LIMITATIONS:  meal prep, cleaning, laundry, driving, shopping, community activity, and yard work  PERSONAL FACTORS: Age, Time since onset of injury/illness/exacerbation, and 1-2 comorbidities: DM, HLD are also affecting patient's functional outcome.   REHAB POTENTIAL: Good  CLINICAL DECISION MAKING: Evolving/moderate complexity  EVALUATION COMPLEXITY: Moderate  GOALS: Goals reviewed with patient? Yes  SHORT TERM GOALS: Target date: 05/11/2024    Patient will be independent with HEP in order to improve functional outcomes. Baseline: Goal status: IN PROGRESS 04/06/24  2.  Patient will report at least 25% improvement in symptoms for improved quality of life. Baseline:  Goal status: IN PROGRESS 04/20/24  3.  Patient will demonstrate Elevation to 130 deg and ER to 30 deg - passive, active assisted or active  Baseline:  Goal status: MET 04/20/24  4.  Patient will be out of sling per surgeon approval for improved functional use of L shoulder. Baseline:  Goal status: MET 04/13/24   LONG TERM GOALS: Target date: 06/22/2024    Patient will report at least 75% improvement in symptoms for improved quality of life. Baseline:  Goal status: IN PROGRESS 05/11/24  2.  Patient will improve UEFS score by at least 25 points in order to indicate improved tolerance to activity. Baseline:  Goal status: IN PROGRESS 05/11/24  3.  Patient will demonstrate at least PROM/AAROM/AROM to: Elevation - 145-160; ER - 40-50 ; functional IR to L1 in L shoulder for improved ability lift overhead. Baseline:  Goal status: IN PROGRESS 05/11/24  4.  Patient will be able to return to all activities unrestricted for improved ability to perform yard work functions and participate  with family.  Baseline:  Goal status: IN PROGRESS 05/11/24  5.  Patient will demonstrate grade of 5/5 MMT grade in all tested musculature as evidence of improved strength to assist with lifting at home. Baseline:  Goal status: IN PROGRESS 05/11/24   PLAN:  PT FREQUENCY: 1-2x/week  PT DURATION: 12 weeks  PLANNED INTERVENTIONS:[id number]- PT Re-evaluation, 97110-Therapeutic exercises, 97530- Therapeutic activity, 97112- Neuromuscular re-education, 97535- Self Care, [id number]- Manual therapy, [phone]- Gait training, [phone]- Orthotic Fit/training, [phone]- Canalith repositioning, [id number]- Aquatic Therapy, [phone]- Splinting, [phone]- Wound care (first 20 sq cm), 97598- Wound care (each additional 20 sq cm)Patient/Family education, Balance training, Stair training, Taping, Dry Needling, Joint mobilization, Joint manipulation, Spinal manipulation, Spinal mobilization, Scar mobilization, and DME instructions.  PLAN FOR NEXT SESSION: Progress note.   Lili Finder, Student PT 06/08/24 8:46 AM  This entire  session was performed under direct supervision and direction of a licensed Estate agent . I have personally read, edited and approve of the note as written. 10:20 AM, 06/08/24 Prentice CANDIE Stains PT, DPT Physical Therapist at [facility]

## 2024-06-12 ENCOUNTER — Encounter: Payer: Self-pay | Admitting: Family Medicine

## 2024-06-12 ENCOUNTER — Encounter (HOSPITAL_BASED_OUTPATIENT_CLINIC_OR_DEPARTMENT_OTHER)

## 2024-06-14 NOTE — Telephone Encounter (Signed)
 Do you need to see in office for review of meds with him?

## 2024-06-15 ENCOUNTER — Encounter (HOSPITAL_BASED_OUTPATIENT_CLINIC_OR_DEPARTMENT_OTHER): Admitting: Physical Therapy

## 2024-06-18 ENCOUNTER — Encounter: Payer: Self-pay | Admitting: Radiology

## 2024-06-19 ENCOUNTER — Other Ambulatory Visit: Payer: Self-pay | Admitting: Family Medicine

## 2024-06-19 ENCOUNTER — Encounter (HOSPITAL_BASED_OUTPATIENT_CLINIC_OR_DEPARTMENT_OTHER): Payer: Self-pay | Admitting: Physical Therapy

## 2024-06-19 ENCOUNTER — Ambulatory Visit (HOSPITAL_BASED_OUTPATIENT_CLINIC_OR_DEPARTMENT_OTHER): Attending: Orthopaedic Surgery | Admitting: Physical Therapy

## 2024-06-19 DIAGNOSIS — M6281 Muscle weakness (generalized): Secondary | ICD-10-CM | POA: Insufficient documentation

## 2024-06-19 DIAGNOSIS — M25612 Stiffness of left shoulder, not elsewhere classified: Secondary | ICD-10-CM | POA: Insufficient documentation

## 2024-06-19 DIAGNOSIS — M25512 Pain in left shoulder: Secondary | ICD-10-CM | POA: Diagnosis present

## 2024-06-19 DIAGNOSIS — R29898 Other symptoms and signs involving the musculoskeletal system: Secondary | ICD-10-CM | POA: Diagnosis present

## 2024-06-19 NOTE — Therapy (Addendum)
 OUTPATIENT PHYSICAL THERAPY SHOULDER TREATMENT NOTE   Patient Name: Greg Rogers. MRN: [id number] DOB:1941-07-13, 83 y.o., male Today's Date: 06/19/2024  Progress Note   Reporting Period 05/11/24 to 06/19/24   See note below for Objective Data and Assessment of Progress/Goals   END OF SESSION:  PT End of Session - 06/19/24 0846     Visit Number 18    Number of Visits 48    Date for Recertification  09/11/24    Authorization Type MCR    Progress Note Due on Visit 28    PT Start Time 0847    PT Stop Time 0930    PT Time Calculation (min) 43 min    Activity Tolerance Patient tolerated treatment well    Behavior During Therapy WFL for tasks assessed/performed         Past Medical History:  Diagnosis Date   Abnormal GGT test    With otherwise normal work up.   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)    Persistent disorder of initiating or maintaining sleep    Pes planus    Rosacea    Snoring    Past Surgical History:  Procedure Laterality Date   APPENDECTOMY  1949   at age 76   CERVICAL FUSION  03/2016   C 3&4   Cyst removed from breast  1960   FOOT SURGERY  01/2009   Left   HERNIA REPAIR  1956   at age 36 (single)   HERNIA REPAIR  1960   (double)   Left 2nd toe removed  03/2017   REVERSE SHOULDER ARTHROPLASTY Left 03/27/2024   Procedure: ARTHROPLASTY, SHOULDER, TOTAL, REVERSE;  Surgeon: Genelle Standing, MD;  Location: [facility];  Service: Orthopedics;  Laterality: Left;   SPINE SURGERY     TONSILLECTOMY AND ADENOIDECTOMY  ~ 1950   VASECTOMY  ~ 1984   Patient Active Problem List   Diagnosis Date Noted   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 03/27/2024   Leg pain 03/21/2024   Shoulder pain 12/07/2023   Change  in stool 11/02/2023   Nasal lesion 06/02/2023   Occipital neuralgia 10/12/2020   Wound of right leg, initial encounter 07/03/2019   Healthcare maintenance 03/04/2017   Foot pain 07/22/2016   Cervical stenosis of spine 01/06/2016   thyroid  nodule-benign path on bx 01/2016 01/06/2016   Radicular pain in right arm 12/22/2015   Radicular leg pain 12/22/2015   Back pain 07/06/2015   Constipation 07/06/2015   FH: CAD (coronary artery disease) 05/23/2015   Family history of ischemic heart disease and other diseases of the circulatory system 05/23/2015   Advance care planning 11/26/2014   Pain in joint, shoulder region 11/26/2014   Medicare annual wellness visit, subsequent 09/27/2012   GERD (gastroesophageal reflux disease) 07/05/2012   Irritability 03/29/2011   Advance directive on file 01/09/2011   Pain in joint, other site 01/04/2011   Arthropathy 09/02/2010   NEPHROLITHIASIS, HX OF 09/02/2010   Diabetes mellitus without complication (HCC) 09/01/2010   HLD (hyperlipidemia) 10/24/2009   SNORING 10/24/2009   FOOT PAIN, BILATERAL 10/06/2009   Pes planus 10/06/2009   Acquired flat foot 10/06/2009    PCP: Cleatus,  Arlyss RAMAN, MD  REFERRING PROVIDER: Genelle Standing, MD  REFERRING DIAG: [phone] (ICD-10-CM) - 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, initial encounter;  1. Left shoulder reverse shoulder arthroplasty 2. Left shoulder biceps tenodesis  THERAPY DIAG:  Acute pain of left shoulder  Muscle weakness (generalized)  Stiffness of left shoulder, not elsewhere classified  Other symptoms and signs involving the musculoskeletal system  Rationale for Evaluation and Treatment: Rehabilitation  ONSET DATE: DOS 03/27/24  SUBJECTIVE:                                                                                                                                                                                      SUBJECTIVE STATEMENT: Reports he is doing well. States he didn't  have any problems on his trip with his shoulder. States he is a little stuff today from working his shoulder hard yesterday.   EVAL: Patient states shoulder has been doing good. Using sling. Was very active prior to injury/surgery with gardening/yard work Hand dominance: Right  PERTINENT HISTORY: DM, HLD, chronic shoulder pain with fall in March while exiting ski lift with RC tear  PAIN:  Are you having pain? Yes 2/10 Location: anterior Lt shoulder Description: dull, uncomfortable      PRECAUTIONS: Other: L reverse TSA   RED FLAGS: None   WEIGHT BEARING RESTRICTIONS: NWB LUE at eval  FALLS:  Has patient fallen in last 6 months? Yes. Number of falls 1  PLOF: Independent  PATIENT GOALS:regain use of the arm  OBJECTIVE: (objective measures from initial evaluation unless otherwise dated)  OBSERVATION: using sling, waterproof dressing intact  PATIENT SURVEYS:  UEFS  Extreme difficulty/unable (0), Quite a bit of difficulty (1), Moderate difficulty (2), Little difficulty (3), No difficulty (4) Survey date:  03/30/24 06/19/24  Any of your usual work, household or school activities 0   2. Your usual hobbies, recreational/sport activities 1    3. Lifting a bag of groceries to waist level 2    4. Lifting a bag of groceries above your head 0   5. Grooming your hair 4   6. Pushing up on your hands (I.e. from bathtub or chair) 0   7. Preparing food (I.e. peeling/cutting) 1   8. Driving  3   9. Vacuuming, sweeping, or raking 4   10. Dressing  3   11. Doing up buttons 2   12. Using tools/appliances 1   13. Opening doors 0   14. Cleaning  3   15. Tying or lacing shoes 2   16. Sleeping  3   17. Laundering clothes (I.e. washing, ironing, folding) 3   18. Opening a jar 3  19. Throwing a ball 1   20. Carrying a small suitcase with your affected limb.  1   Score total:  35/80 58/80     05/08/24- UEFS 15/80 via online version, later noted questions were different than one in epic    COGNITION: Overall cognitive status: Within functional limits for tasks assessed     SENSATION: WFL  POSTURE: rounded shoulders and forward head   UPPER EXTREMITY ROM: AROM RUE WFL  Passive ROM Left eval L 04/06/24 L 04/13/24 L 04/20/24 L 04/24/24 L 04/27/24 L 9/17 L  9/19 L 9/26 L 06/01/24 L 06/05/24 L 06/07/24 L 06/19/24  Shoulder flexion 100 PROM 110 AAROM 110* PROM, 120* AAROM supine  120-130* PROM (bit guarded today) 130* PROM and AROM  114 AROM 140 AAROM 145 AAROM 138 AAROM Improves to 149 AAROM 139 AAROM 148 AAROM improves to 153 AAROM 144* AAROM  130 degrees on shoulder flexion ladder  120 before manual, 135 after in supine, 140 on shoulder flexion ladder  135 deg before manual 140  130 AAROM against gravity   Shoulder extension               Shoulder abduction               Shoulder adduction               Shoulder internal rotation         Mid glute. L5 at best with IR stretch     L5  Shoulder external rotation 20 25* next to body PROM setaed (very guarded), 30* AAROM seated  30* PROM arm next to body  30* PROM arm next to body      30* PROM/AAROM supine arm next to body but limited by anterior shoulder pain     60 deg   Elbow flexion               Elbow extension               Wrist flexion               Wrist extension               Wrist ulnar deviation               Wrist radial deviation               Wrist pronation               Wrist supination               (Blank rows = not tested) *=pain/symptoms  UPPER EXTREMITY MMT: NT due to post op status  MMT Right eval Left eval Left 05/11/24 Left 06/19/24  Shoulder flexion   3 UT compensations  3  Shoulder extension      Shoulder abduction   3 UT compensations  3  Shoulder adduction      Shoulder internal rotation    4-  Shoulder external rotation    4-  Middle trapezius      Lower trapezius      Elbow flexion      Elbow extension      Wrist flexion      Wrist extension      Wrist ulnar deviation       Wrist radial deviation      Wrist pronation      Wrist supination      Grip strength (lbs)      (  Blank rows = not tested) *=pain/symptoms    PALPATION:  LUE gross edema   TODAY'S TREATMENT:                                                                                                                                         DATE:  06/19/24 Pulleys x 5 min  Reassessment  Standing shoulder flexion in mirror x10 Standing shoulder flexion ladder x10 Standing wall slide with towel and RTB around hands 2x10  06/07/24 Shoulder flexion pulleys x5 min  Posterior/inferior GHJ glides grade II-III L shoulder PROM into flexion/ER  STM to anterior deltoid, biceps, lateral deltoid, and UT Shoulder flexion with dowel x10 SL shoulder abduction with bent elbow 2x10 SL shoulder ER 2x10 Shoulder flexion, abduction, and ER in mirror x5 each  Shoulder flexion circles on ball on wall 2x10  06/05/24 STM and trigger point release to L upper trap, deltoid, and bicep  Grade II-III posterior and inferior mobilizations at L shoulder  Shoulder flexion in supine with dowel 2x10  Shoulder ABCs x2 SL shoulder abduction 2x10  Shoulder IR with dowel behind back 2x10 Shoulder flexion ladder 2x10  06/01/24 Pulleys x5 min STM and trigger point release to L upper trap, deltoid, and bicep  Grade II-III posterior and inferior mobilizations at L shoulder  PROM to L shoulder Shoulder flexion, abduction, ER/IR, extension isometric contractions x10 each with 3 second holds  Shoulder flexion in supine with dowel x10 Shoulder flexion ladder x10   05/29/24 STM and trigger point release to L upper trap, deltoid, and bicep  Grade II-III posterior and inferior mobilizations at L shoulder  PROM to L shoulder AAROM shoulder flexion 3x10 Supine pec stretch 3x10 seconds   05/25/24 -Pulleys into Rt shoulder flexion - ROM with cane in front of mirror:  bil shoulder ext, IR sliding gently behind back, bil shoulder  flexion, Lt scaption - row with scap squeeze 3 sec hold x 12, green band  - bow and arrow (midlevel row) with same side step back, green band x 10 each side - shoulder extension to neutral with scap squeeze with red band x 10 in semi-staggered stance - applied reg KTtape in zigzag pattern over Lt shoulder incision. Pt given verbal instructions on safe removal technique; verbalized understanding  05/22/24 STM and trigger point release to L upper trap, deltoid, and bicep  Massage gun to L upper trap, deltoid, and bicep  Grade II-III posterior and inferior mobilizations at L shoulder  PROM to L shoulder  05/18/24 STM and trigger point release to L upper trap, deltoid, and bicep. Grade II-III posterior and inferior mobilizations at shoulder  Pulleys x6 min  Serratus punch + full alphabet 3# x2 rounds supine Standing shoulder abduction 2x10 within range without shrugging shoulder  Shoulder flexion with GTB around hands for RC stability 2x10  05/15/24 Pulleys x6 minutes for w/u Wall ladder flexion x12 Scap  retractions blue TB x15  Shoulder extensions red TB x15 Standing shoulder flexion to 90* 0# x10 Standing shoulder ABD to 70-80* 0# x10  Serratus punch 3# x12 supine  Serratus punch + CW/CCW circles 3# x15 each way  supine  Serratus punch + full alphabet 3# x2 rounds supine  Wall ball x10 CW/CCW Thoracic extension stretch + shoulder flexion x10  Thoracic lateral flexion stretch x10 B Thoracic rotation stretch x10 B  05/11/24 Pulleys x6 minutes for w/u  ROM, MMT, goals for progress note, education on healing status given recent surgery/benefit of rest days and tapering activity, importance of reducing inflammation in the shoulder  Supine flexion AAROM with dowel x12 IR stretch with sheet to tolerance x12 L stretch at table 10x3 seconds  Supine shoulder flexion 1# 2x10- unable to tolerate 2# today  Held off on sidelying ABD and ER due to pain patterns today  Scap retractions blue TB  x15  Shoulder extensions red TB x10   05/08/24 Wall ladder for flexion x15  Pulleys x4 minutes Gentle IR stretch with towel x10 L stretch at high table 10x3 seconds for shoulder flexion Supine shoulder flexion 2# 2x10 Sidelying shoulder ABD 0# 2x12 Supine serratus punches 2# x12 Supine serratus punch + CW/CCW circles x12 each 2# Supine serratus punch 2# alphabet x1  9/19 Manual: Grade II-III inferior glides in flexion, STM deltoid, tricep Supine AAROM flexion with 3# bar 2 x 10 flat table ABCs in flexion 3 sets 1# Sidelying ER 2 x 12 1#  Sidelying abduction 3x10  Standing functional IR strech with cane 10 x 10 second holds gentle Standing row GTB 1x15, progressed to BTB 1x15  05/02/24 Pulley flexion 4 minutes Supine AAROM flexion with 2# bar 2 x 10 flat table Manual: Grade II-III inferior glides in flexion, STM deltoid, tricep Perturbations in flexion 3 x 20 Supine SA punch 3# 2 x 15 Sidelying ER 1 x 12, 1# 1 x 12 Standing  functional IR strech with cane 10 x 10 second holds gentle Standing row GTB 2x 10 Wall ladder flexion 1 x 10  04/27/24 Pulley flexion 4 minutes Manual: Grade II-III inferior glides in flexion, STM deltoid, tricep Supine AAROM flexion with 1# bar 2 x 10 flat table, 30 degree incline 1# 1 x 10 Prone shoulder extension 1# 3 x 10 Prone row 1# 3 x 10 Wall ladder flexion 1 x 10  PATIENT EDUCATION:  Education details: Patient educated on exam findings, POC, scope of PT, HEP, relevant anatomy/biomechanics, protocol/precautions. Person educated: Patient Education method: Explanation, Demonstration, and Handouts Education comprehension: verbalized understanding, returned demonstration, verbal cues required, and tactile cues required  HOME EXERCISE PROGRAM:  Access Code: [email or web address] URL: [email or web address] Date: 04/13/2024 Prepared by: Josette Rough  Exercises - Wrist AROM Flexion Extension  - 3 x daily - 7 x weekly - 2 sets - 10 reps -  Seated Elbow Flexion and Extension AROM (Mirrored)  - 3 x daily - 7 x weekly - 2 sets - 10 reps - Circular Shoulder Pendulum with Table Support (Mirrored)  - 3 x daily - 7 x weekly - 2 sets - 10 reps - Seated Scapular Retraction  - 3 x daily - 7 x weekly - 2 sets - 10 reps - Seated Shoulder Flexion Towel Slide at Table Top (Mirrored)  - 3 x daily - 7 x weekly - 10 reps - 10 second hold - Supine Shoulder Flexion AAROM with Hands Clasped  - 3 x daily - 7 x weekly -  3 sets - 10 reps - Supine Shoulder External Rotation with Dowel  - 3 x daily - 7 x weekly - 2 sets - 10 reps - Standing Isometric Shoulder Flexion with Doorway - Arm Bent  - 1 x daily - 7 x weekly - 2 sets - 10 reps - 3 seconds  hold - Standing Isometric Shoulder Abduction with Doorway - Arm Bent  - 1 x daily - 7 x weekly - 2 sets - 10 reps - 3 seconds  hold - Standing Isometric Shoulder Extension with Doorway - Arm Bent  - 1 x daily - 7 x weekly - 2 sets - 10 reps - 3 seconds  hold - Standing Isometric Shoulder External Rotation with Doorway  - 1 x daily - 7 x weekly - 2 sets - 10 reps - 3 seconds  hold   ASSESSMENT:  CLINICAL IMPRESSION: Patient has met 4/4 short term goals and 0/5 long term goals with ability to complete HEP and improvement in symptoms, strength, ROM, activity tolerance, gait, balance, and functional mobility. Remaining goals not met due to continued deficits in L shoulder ROM, strength, activity tolerance, and functional mobility. Patient has made good progress toward remaining goals. Patient is continuing to progress each week and is extremely motivated. Patient will continue to benefit from skilled physical therapy in order to improve function and reduce impairment.    EVAL: Patient a 83 y.o. y.o. male who was seen today for physical therapy evaluation and treatment for L reverse TSA DOS 03/27/24. Patient presents with pain limited deficits in L shoulder strength, ROM, endurance, activity tolerance, and functional  mobility with ADL. Patient is having to modify and restrict ADL as indicated by outcome measure score as well as subjective information and objective measures which is affecting overall participation. Patient will benefit from skilled physical therapy in order to improve function and reduce impairment.  OBJECTIVE IMPAIRMENTS: decreased activity tolerance, decreased endurance, decreased mobility, decreased ROM, decreased strength, increased muscle spasms, impaired flexibility, impaired UE functional use, improper body mechanics, and pain  ACTIVITY LIMITATIONS:  carrying, lifting, bending, sleeping, bed mobility, bathing, dressing, reach over head, hygiene/grooming, and caring for others  PARTICIPATION LIMITATIONS:  meal prep, cleaning, laundry, driving, shopping, community activity, and yard work  PERSONAL FACTORS: Age, Time since onset of injury/illness/exacerbation, and 1-2 comorbidities: DM, HLD are also affecting patient's functional outcome.   REHAB POTENTIAL: Good  CLINICAL DECISION MAKING: Evolving/moderate complexity  EVALUATION COMPLEXITY: Moderate  GOALS: Goals reviewed with patient? Yes  SHORT TERM GOALS: Target date: 05/11/2024    Patient will be independent with HEP in order to improve functional outcomes. Baseline: Goal status: MET 06/19/24  2.  Patient will report at least 25% improvement in symptoms for improved quality of life. Baseline:  Goal status: MET 06/19/24  3.  Patient will demonstrate Elevation to 130 deg and ER to 30 deg - passive, active assisted or active  Baseline:  Goal status: MET 04/20/24  4.  Patient will be out of sling per surgeon approval for improved functional use of L shoulder. Baseline:  Goal status: MET 04/13/24   LONG TERM GOALS: Target date: 09/11/2024   Patient will report at least 75% improvement in symptoms for improved quality of life. Baseline:  Goal status: IN PROGRESS (65% 06/19/24)  2.  Patient will improve UEFS score by at least  25 points in order to indicate improved tolerance to activity. Baseline:  Goal status: IN PROGRESS 06/19/24  3.  Patient will demonstrate at least  PROM/AAROM/AROM to: Elevation - 145-160; ER - 40-50 ; functional IR to L1 in L shoulder for improved ability lift overhead. Baseline:  Goal status: IN PROGRESS 06/19/24  4.  Patient will be able to return to all activities unrestricted for improved ability to perform yard work functions and participate with family.  Baseline:  Goal status: IN PROGRESS 06/19/24  5.  Patient will demonstrate grade of 5/5 MMT grade in all tested musculature as evidence of improved strength to assist with lifting at home. Baseline:  Goal status: IN PROGRESS 06/19/24   PLAN:  PT FREQUENCY: 1-2x/week  PT DURATION: 12 weeks  PLANNED INTERVENTIONS:[id number]- PT Re-evaluation, 97110-Therapeutic exercises, 97530- Therapeutic activity, 97112- Neuromuscular re-education, 97535- Self Care, [id number]- Manual therapy, [phone]- Gait training, [phone]- Orthotic Fit/training, [phone]- Canalith repositioning, [id number]- Aquatic Therapy, [phone]- Splinting, [phone]- Wound care (first 20 sq cm), 97598- Wound care (each additional 20 sq cm)Patient/Family education, Balance training, Stair training, Taping, Dry Needling, Joint mobilization, Joint manipulation, Spinal manipulation, Spinal mobilization, Scar mobilization, and DME instructions.  PLAN FOR NEXT SESSION: progress per protocol   Lili Finder, Student PT 06/19/24 9:31 AM  This entire session was performed under direct supervision and direction of a licensed therapist/therapist assistant . I have personally read, edited and approve of the note as written. 10:00 AM, 06/19/24 Prentice CANDIE Stains PT, DPT Physical Therapist at [facility]

## 2024-06-20 NOTE — Telephone Encounter (Signed)
 Sent. Thanks.

## 2024-06-20 NOTE — Telephone Encounter (Signed)
 Chart reviewed, unclear what dose of metformin  Pt should be taking. See med list.

## 2024-06-22 ENCOUNTER — Ambulatory Visit (HOSPITAL_BASED_OUTPATIENT_CLINIC_OR_DEPARTMENT_OTHER): Admitting: Physical Therapy

## 2024-06-22 DIAGNOSIS — M25612 Stiffness of left shoulder, not elsewhere classified: Secondary | ICD-10-CM | POA: Diagnosis not present

## 2024-06-22 DIAGNOSIS — R29898 Other symptoms and signs involving the musculoskeletal system: Secondary | ICD-10-CM | POA: Diagnosis not present

## 2024-06-22 DIAGNOSIS — M25512 Pain in left shoulder: Secondary | ICD-10-CM

## 2024-06-22 DIAGNOSIS — M6281 Muscle weakness (generalized): Secondary | ICD-10-CM | POA: Diagnosis not present

## 2024-06-22 NOTE — Therapy (Signed)
 OUTPATIENT PHYSICAL THERAPY SHOULDER TREATMENT NOTE   Patient Name: Greg Rogers. MRN: [id number] DOB:1940-11-20, 83 y.o., male Today's Date: 06/22/2024  END OF SESSION:  PT End of Session - 06/22/24 0855     Visit Number 19    Number of Visits 48    Date for Recertification  09/11/24    Authorization Type MCR    Progress Note Due on Visit 28    PT Start Time 0848    PT Stop Time 0926    PT Time Calculation (min) 38 min    Behavior During Therapy WFL for tasks assessed/performed         Past Medical History:  Diagnosis Date   Abnormal GGT test    With otherwise normal work up.   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)    Persistent disorder of initiating or maintaining sleep    Pes planus    Rosacea    Snoring    Past Surgical History:  Procedure Laterality Date   APPENDECTOMY  1949   at age 89   CERVICAL FUSION  03/2016   C 3&4   Cyst removed from breast  1960   FOOT SURGERY  01/2009   Left   HERNIA REPAIR  1956   at age 57 (single)   HERNIA REPAIR  1960   (double)   Left 2nd toe removed  03/2017   REVERSE SHOULDER ARTHROPLASTY Left 03/27/2024   Procedure: ARTHROPLASTY, SHOULDER, TOTAL, REVERSE;  Surgeon: Genelle Standing, MD;  Location: [facility];  Service: Orthopedics;  Laterality: Left;   SPINE SURGERY     TONSILLECTOMY AND ADENOIDECTOMY  ~ 1950   VASECTOMY  ~ 1984   Patient Active Problem List   Diagnosis Date Noted   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 03/27/2024   Leg pain 03/21/2024   Shoulder pain 12/07/2023   Change in stool 11/02/2023   Nasal lesion 06/02/2023   Occipital neuralgia 10/12/2020   Wound of right leg, initial encounter 07/03/2019   Healthcare maintenance 03/04/2017   Foot pain  07/22/2016   Cervical stenosis of spine 01/06/2016   thyroid  nodule-benign path on bx 01/2016 01/06/2016   Radicular pain in right arm 12/22/2015   Radicular leg pain 12/22/2015   Back pain 07/06/2015   Constipation 07/06/2015   FH: CAD (coronary artery disease) 05/23/2015   Family history of ischemic heart disease and other diseases of the circulatory system 05/23/2015   Advance care planning 11/26/2014   Pain in joint, shoulder region 11/26/2014   Medicare annual wellness visit, subsequent 09/27/2012   GERD (gastroesophageal reflux disease) 07/05/2012   Irritability 03/29/2011   Advance directive on file 01/09/2011   Pain in joint, other site 01/04/2011   Arthropathy 09/02/2010   NEPHROLITHIASIS, HX OF 09/02/2010   Diabetes mellitus without complication (HCC) 09/01/2010   HLD (hyperlipidemia) 10/24/2009   SNORING 10/24/2009   FOOT PAIN, BILATERAL 10/06/2009   Pes planus 10/06/2009   Acquired flat foot 10/06/2009    PCP: Cleatus Arlyss RAMAN, MD  REFERRING PROVIDER: Genelle Standing, MD  REFERRING DIAG: S46.012A (ICD-10-CM) - 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, initial encounter;  1. Left shoulder reverse shoulder arthroplasty 2.  Left shoulder biceps tenodesis  THERAPY DIAG:  Acute pain of left shoulder  Muscle weakness (generalized)  Stiffness of left shoulder, not elsewhere classified  Other symptoms and signs involving the musculoskeletal system  Rationale for Evaluation and Treatment: Rehabilitation  ONSET DATE: DOS 03/27/24  SUBJECTIVE:                                                                                                                                                                                      SUBJECTIVE STATEMENT: Pt reports that his Lt (anterior) shoulder has been hurting more over the last 2 days. He reports difficulty raising his arm to front.   EVAL: Patient states shoulder has been doing good. Using sling. Was very active prior  to injury/surgery with gardening/yard work Hand dominance: Right  PERTINENT HISTORY: DM, HLD, chronic shoulder pain with fall in March while exiting ski lift with RC tear  PAIN:  Are you having pain? Yes 3/10 Location: anterior Lt shoulder Description: dull, uncomfortable Worsened by: raising arm to front Relieved by:  massage, icy hot      PRECAUTIONS: Other: L reverse TSA   RED FLAGS: None   WEIGHT BEARING RESTRICTIONS: NWB LUE at eval  FALLS:  Has patient fallen in last 6 months? Yes. Number of falls 1  PLOF: Independent  PATIENT GOALS:regain use of the arm  OBJECTIVE: (objective measures from initial evaluation unless otherwise dated)  OBSERVATION: using sling, waterproof dressing intact  PATIENT SURVEYS:  UEFS  Extreme difficulty/unable (0), Quite a bit of difficulty (1), Moderate difficulty (2), Little difficulty (3), No difficulty (4) Survey date:  03/30/24 06/19/24  Any of your usual work, household or school activities 0   2. Your usual hobbies, recreational/sport activities 1    3. Lifting a bag of groceries to waist level 2    4. Lifting a bag of groceries above your head 0   5. Grooming your hair 4   6. Pushing up on your hands (I.e. from bathtub or chair) 0   7. Preparing food (I.e. peeling/cutting) 1   8. Driving  3   9. Vacuuming, sweeping, or raking 4   10. Dressing  3   11. Doing up buttons 2   12. Using tools/appliances 1   13. Opening doors 0   14. Cleaning  3   15. Tying or lacing shoes 2   16. Sleeping  3   17. Laundering clothes (I.e. washing, ironing, folding) 3   18. Opening a jar 3   19. Throwing a ball 1   20. Carrying a small suitcase with your affected limb.  1   Score total:  35/80 58/80  05/08/24- UEFS 15/80 via online version, later noted questions were different than one in epic   COGNITION: Overall cognitive status: Within functional limits for tasks assessed     SENSATION: WFL  POSTURE: rounded shoulders and forward  head   UPPER EXTREMITY ROM: AROM RUE WFL  Passive ROM Left eval L 04/06/24 L 04/13/24 L 04/20/24 L 04/24/24 L 04/27/24 L 9/17 L  9/19 L 9/26 L 06/01/24 L 06/05/24 L 06/07/24 L 06/19/24  Shoulder flexion 100 PROM 110 AAROM 110* PROM, 120* AAROM supine  120-130* PROM (bit guarded today) 130* PROM and AROM  114 AROM 140 AAROM 145 AAROM 138 AAROM Improves to 149 AAROM 139 AAROM 148 AAROM improves to 153 AAROM 144* AAROM  130 degrees on shoulder flexion ladder  120 before manual, 135 after in supine, 140 on shoulder flexion ladder  135 deg before manual 140  130 AAROM against gravity   Shoulder extension               Shoulder abduction               Shoulder adduction               Shoulder internal rotation         Mid glute. L5 at best with IR stretch     L5  Shoulder external rotation 20 25* next to body PROM setaed (very guarded), 30* AAROM seated  30* PROM arm next to body  30* PROM arm next to body      30* PROM/AAROM supine arm next to body but limited by anterior shoulder pain     60 deg   Elbow flexion               Elbow extension               Wrist flexion               Wrist extension               Wrist ulnar deviation               Wrist radial deviation               Wrist pronation               Wrist supination               (Blank rows = not tested) *=pain/symptoms  UPPER EXTREMITY MMT: NT due to post op status  MMT Right eval Left eval Left 05/11/24 Left 06/19/24  Shoulder flexion   3 UT compensations  3  Shoulder extension      Shoulder abduction   3 UT compensations  3  Shoulder adduction      Shoulder internal rotation    4-  Shoulder external rotation    4-  Middle trapezius      Lower trapezius      Elbow flexion      Elbow extension      Wrist flexion      Wrist extension      Wrist ulnar deviation      Wrist radial deviation      Wrist pronation      Wrist supination      Grip strength (lbs)      (Blank rows = not tested)  *=pain/symptoms    PALPATION:  LUE gross edema   TODAY'S TREATMENT:  DATE:  06/22/24 UBE L1, 1 min forward, 1 min backward, 30 sec each forward/backward Pulleys into flexion x 15, into abdct x 15 Manual therapy: STM to Lt pec major, biceps brachii, upper/mid trap Supine Lt shoulder flexion with scap set x 8, within painfree range Supine Lt pec stretch with arm at 90-100 Prone row with 2# with tactile cues for scap retraction/depress Prone Lt shoulder ext to neutral Prone Lt shoulder T's (no Wt)  06/19/24 Pulleys x 5 min  Reassessment  Standing shoulder flexion in mirror x10 Standing shoulder flexion ladder x10 Standing wall slide with towel and RTB around hands 2x10  06/07/24 Shoulder flexion pulleys x5 min  Posterior/inferior GHJ glides grade II-III L shoulder PROM into flexion/ER  STM to anterior deltoid, biceps, lateral deltoid, and UT Shoulder flexion with dowel x10 SL shoulder abduction with bent elbow 2x10 SL shoulder ER 2x10 Shoulder flexion, abduction, and ER in mirror x5 each  Shoulder flexion circles on ball on wall 2x10  06/05/24 STM and trigger point release to L upper trap, deltoid, and bicep  Grade II-III posterior and inferior mobilizations at L shoulder  Shoulder flexion in supine with dowel 2x10  Shoulder ABCs x2 SL shoulder abduction 2x10  Shoulder IR with dowel behind back 2x10 Shoulder flexion ladder 2x10  06/01/24 Pulleys x5 min STM and trigger point release to L upper trap, deltoid, and bicep  Grade II-III posterior and inferior mobilizations at L shoulder  PROM to L shoulder Shoulder flexion, abduction, ER/IR, extension isometric contractions x10 each with 3 second holds  Shoulder flexion in supine with dowel x10 Shoulder flexion ladder x10   05/29/24 STM and trigger point release to L upper trap, deltoid,  and bicep  Grade II-III posterior and inferior mobilizations at L shoulder  PROM to L shoulder AAROM shoulder flexion 3x10 Supine pec stretch 3x10 seconds   05/25/24 -Pulleys into Rt shoulder flexion - ROM with cane in front of mirror:  bil shoulder ext, IR sliding gently behind back, bil shoulder flexion, Lt scaption - row with scap squeeze 3 sec hold x 12, green band  - bow and arrow (midlevel row) with same side step back, green band x 10 each side - shoulder extension to neutral with scap squeeze with red band x 10 in semi-staggered stance - applied reg KTtape in zigzag pattern over Lt shoulder incision. Pt given verbal instructions on safe removal technique; verbalized understanding  05/22/24 STM and trigger point release to L upper trap, deltoid, and bicep  Massage gun to L upper trap, deltoid, and bicep  Grade II-III posterior and inferior mobilizations at L shoulder  PROM to L shoulder  05/18/24 STM and trigger point release to L upper trap, deltoid, and bicep. Grade II-III posterior and inferior mobilizations at shoulder  Pulleys x6 min  Serratus punch + full alphabet 3# x2 rounds supine Standing shoulder abduction 2x10 within range without shrugging shoulder  Shoulder flexion with GTB around hands for RC stability 2x10  05/15/24 Pulleys x6 minutes for w/u Wall ladder flexion x12 Scap retractions blue TB x15  Shoulder extensions red TB x15 Standing shoulder flexion to 90* 0# x10 Standing shoulder ABD to 70-80* 0# x10  Serratus punch 3# x12 supine  Serratus punch + CW/CCW circles 3# x15 each way  supine  Serratus punch + full alphabet 3# x2 rounds supine  Wall ball x10 CW/CCW Thoracic extension stretch + shoulder flexion x10  Thoracic lateral flexion stretch x10 B Thoracic rotation stretch x10 B  05/11/24  Pulleys x6 minutes for w/u  ROM, MMT, goals for progress note, education on healing status given recent surgery/benefit of rest days and tapering activity,  importance of reducing inflammation in the shoulder  Supine flexion AAROM with dowel x12 IR stretch with sheet to tolerance x12 L stretch at table 10x3 seconds  Supine shoulder flexion 1# 2x10- unable to tolerate 2# today  Held off on sidelying ABD and ER due to pain patterns today  Scap retractions blue TB x15  Shoulder extensions red TB x10   05/08/24 Wall ladder for flexion x15  Pulleys x4 minutes Gentle IR stretch with towel x10 L stretch at high table 10x3 seconds for shoulder flexion Supine shoulder flexion 2# 2x10 Sidelying shoulder ABD 0# 2x12 Supine serratus punches 2# x12 Supine serratus punch + CW/CCW circles x12 each 2# Supine serratus punch 2# alphabet x1  9/19 Manual: Grade II-III inferior glides in flexion, STM deltoid, tricep Supine AAROM flexion with 3# bar 2 x 10 flat table ABCs in flexion 3 sets 1# Sidelying ER 2 x 12 1#  Sidelying abduction 3x10  Standing functional IR strech with cane 10 x 10 second holds gentle Standing row GTB 1x15, progressed to BTB 1x15  05/02/24 Pulley flexion 4 minutes Supine AAROM flexion with 2# bar 2 x 10 flat table Manual: Grade II-III inferior glides in flexion, STM deltoid, tricep Perturbations in flexion 3 x 20 Supine SA punch 3# 2 x 15 Sidelying ER 1 x 12, 1# 1 x 12 Standing  functional IR strech with cane 10 x 10 second holds gentle Standing row GTB 2x 10 Wall ladder flexion 1 x 10  04/27/24 Pulley flexion 4 minutes Manual: Grade II-III inferior glides in flexion, STM deltoid, tricep Supine AAROM flexion with 1# bar 2 x 10 flat table, 30 degree incline 1# 1 x 10 Prone shoulder extension 1# 3 x 10 Prone row 1# 3 x 10 Wall ladder flexion 1 x 10  PATIENT EDUCATION:  Education details: Patient educated on exam findings, POC, scope of PT, HEP, relevant anatomy/biomechanics, protocol/precautions. Person educated: Patient Education method: Explanation, Demonstration, and Handouts Education comprehension: verbalized  understanding, returned demonstration, verbal cues required, and tactile cues required  HOME EXERCISE PROGRAM:  Access Code: [email or web address] URL: [email or web address] Date: 04/13/2024 Prepared by: Josette Rough  Exercises - Wrist AROM Flexion Extension  - 3 x daily - 7 x weekly - 2 sets - 10 reps - Seated Elbow Flexion and Extension AROM (Mirrored)  - 3 x daily - 7 x weekly - 2 sets - 10 reps - Circular Shoulder Pendulum with Table Support (Mirrored)  - 3 x daily - 7 x weekly - 2 sets - 10 reps - Seated Scapular Retraction  - 3 x daily - 7 x weekly - 2 sets - 10 reps - Seated Shoulder Flexion Towel Slide at Table Top (Mirrored)  - 3 x daily - 7 x weekly - 10 reps - 10 second hold - Supine Shoulder Flexion AAROM with Hands Clasped  - 3 x daily - 7 x weekly - 3 sets - 10 reps - Supine Shoulder External Rotation with Dowel  - 3 x daily - 7 x weekly - 2 sets - 10 reps - Standing Isometric Shoulder Flexion with Doorway - Arm Bent  - 1 x daily - 7 x weekly - 2 sets - 10 reps - 3 seconds  hold - Standing Isometric Shoulder Abduction with Doorway - Arm Bent  - 1 x daily - 7 x weekly - 2  sets - 10 reps - 3 seconds  hold - Standing Isometric Shoulder Extension with Doorway - Arm Bent  - 1 x daily - 7 x weekly - 2 sets - 10 reps - 3 seconds  hold - Standing Isometric Shoulder External Rotation with Doorway  - 1 x daily - 7 x weekly - 2 sets - 10 reps - 3 seconds  hold   ASSESSMENT:  CLINICAL IMPRESSION: Pt arrived with increased pain level with forward flexion of LUE.  Time spent working gentle ROM, STM, and retraining of periscapular musculature for improved mechanics of motion with movement in Lt shoulder.  Encouraged pt to complete gentle stretching but avoid bands and weights over weekend. Will revisit exercises at next visit.  Patient is continuing to progress each week and is extremely motivated. Patient will continue to benefit from skilled physical therapy in order to improve function  and reduce impairment.    EVAL: Patient a 83 y.o. y.o. male who was seen today for physical therapy evaluation and treatment for L reverse TSA DOS 03/27/24. Patient presents with pain limited deficits in L shoulder strength, ROM, endurance, activity tolerance, and functional mobility with ADL. Patient is having to modify and restrict ADL as indicated by outcome measure score as well as subjective information and objective measures which is affecting overall participation. Patient will benefit from skilled physical therapy in order to improve function and reduce impairment.  OBJECTIVE IMPAIRMENTS: decreased activity tolerance, decreased endurance, decreased mobility, decreased ROM, decreased strength, increased muscle spasms, impaired flexibility, impaired UE functional use, improper body mechanics, and pain  ACTIVITY LIMITATIONS:  carrying, lifting, bending, sleeping, bed mobility, bathing, dressing, reach over head, hygiene/grooming, and caring for others  PARTICIPATION LIMITATIONS:  meal prep, cleaning, laundry, driving, shopping, community activity, and yard work  PERSONAL FACTORS: Age, Time since onset of injury/illness/exacerbation, and 1-2 comorbidities: DM, HLD are also affecting patient's functional outcome.   REHAB POTENTIAL: Good  CLINICAL DECISION MAKING: Evolving/moderate complexity  EVALUATION COMPLEXITY: Moderate  GOALS: Goals reviewed with patient? Yes  SHORT TERM GOALS: Target date: 05/11/2024    Patient will be independent with HEP in order to improve functional outcomes. Baseline: Goal status: MET 06/19/24  2.  Patient will report at least 25% improvement in symptoms for improved quality of life. Baseline:  Goal status: MET 06/19/24  3.  Patient will demonstrate Elevation to 130 deg and ER to 30 deg - passive, active assisted or active  Baseline:  Goal status: MET 04/20/24  4.  Patient will be out of sling per surgeon approval for improved functional use of L  shoulder. Baseline:  Goal status: MET 04/13/24   LONG TERM GOALS: Target date: 09/11/2024   Patient will report at least 75% improvement in symptoms for improved quality of life. Baseline:  Goal status: IN PROGRESS (65% 06/19/24)  2.  Patient will improve UEFS score by at least 25 points in order to indicate improved tolerance to activity. Baseline:  Goal status: IN PROGRESS 06/19/24  3.  Patient will demonstrate at least PROM/AAROM/AROM to: Elevation - 145-160; ER - 40-50 ; functional IR to L1 in L shoulder for improved ability lift overhead. Baseline:  Goal status: IN PROGRESS 06/19/24  4.  Patient will be able to return to all activities unrestricted for improved ability to perform yard work functions and participate with family.  Baseline:  Goal status: IN PROGRESS 06/19/24  5.  Patient will demonstrate grade of 5/5 MMT grade in all tested musculature as evidence of improved  strength to assist with lifting at home. Baseline:  Goal status: IN PROGRESS 06/19/24   PLAN:  PT FREQUENCY: 1-2x/week  PT DURATION: 12 weeks  PLANNED INTERVENTIONS:[id number]- PT Re-evaluation, 97110-Therapeutic exercises, 97530- Therapeutic activity, 97112- Neuromuscular re-education, 97535- Self Care, [id number]- Manual therapy, [phone]- Gait training, [phone]- Orthotic Fit/training, [phone]- Canalith repositioning, [id number]- Aquatic Therapy, [phone]- Splinting, [phone]- Wound care (first 20 sq cm), 97598- Wound care (each additional 20 sq cm)Patient/Family education, Balance training, Stair training, Taping, Dry Needling, Joint mobilization, Joint manipulation, Spinal manipulation, Spinal mobilization, Scar mobilization, and DME instructions.  PLAN FOR NEXT SESSION: progress per protocol   Delon Aquas, PTA 06/22/24 5:18 PM [facility] [address] [place], [place], [address] Phone: [phone]   Fax:  [phone]

## 2024-06-25 ENCOUNTER — Ambulatory Visit (HOSPITAL_BASED_OUTPATIENT_CLINIC_OR_DEPARTMENT_OTHER): Admitting: Physical Therapy

## 2024-06-25 ENCOUNTER — Encounter (HOSPITAL_BASED_OUTPATIENT_CLINIC_OR_DEPARTMENT_OTHER): Payer: Self-pay | Admitting: Physical Therapy

## 2024-06-25 DIAGNOSIS — M25512 Pain in left shoulder: Secondary | ICD-10-CM

## 2024-06-25 DIAGNOSIS — M6281 Muscle weakness (generalized): Secondary | ICD-10-CM

## 2024-06-25 DIAGNOSIS — M25612 Stiffness of left shoulder, not elsewhere classified: Secondary | ICD-10-CM | POA: Diagnosis not present

## 2024-06-25 DIAGNOSIS — R29898 Other symptoms and signs involving the musculoskeletal system: Secondary | ICD-10-CM | POA: Diagnosis not present

## 2024-06-25 NOTE — Therapy (Signed)
 OUTPATIENT PHYSICAL THERAPY SHOULDER TREATMENT NOTE   Patient Name: Greg Rogers. MRN: [id number] DOB:[birth year], 83 y.o., male Today's Date: 06/25/2024  END OF SESSION:  PT End of Session - 06/25/24 0846     Visit Number 20    Number of Visits 48    Date for Recertification  09/11/24    Authorization Type MCR    Progress Note Due on Visit 28    PT Start Time 0846    PT Stop Time 0924    PT Time Calculation (min) 38 min    Activity Tolerance Patient tolerated treatment well    Behavior During Therapy WFL for tasks assessed/performed         Past Medical History:  Diagnosis Date   Abnormal GGT test    With otherwise normal work up.   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)    Persistent disorder of initiating or maintaining sleep    Pes planus    Rosacea    Snoring    Past Surgical History:  Procedure Laterality Date   APPENDECTOMY  1949   at age 10   CERVICAL FUSION  03/2016   C 3&4   Cyst removed from breast  1960   FOOT SURGERY  01/2009   Left   HERNIA REPAIR  1956   at age 73 (single)   HERNIA REPAIR  1960   (double)   Left 2nd toe removed  03/2017   REVERSE SHOULDER ARTHROPLASTY Left 03/27/2024   Procedure: ARTHROPLASTY, SHOULDER, TOTAL, REVERSE;  Surgeon: Genelle Standing, MD;  Location: [facility];  Service: Orthopedics;  Laterality: Left;   SPINE SURGERY     TONSILLECTOMY AND ADENOIDECTOMY  ~ 1950   VASECTOMY  ~ 1984   Patient Active Problem List   Diagnosis Date Noted   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 03/27/2024   Leg pain 03/21/2024   Shoulder pain 12/07/2023   Change in stool 11/02/2023   Nasal lesion 06/02/2023   Occipital neuralgia 10/12/2020   Wound of right leg, initial encounter  07/03/2019   Healthcare maintenance 03/04/2017   Foot pain 07/22/2016   Cervical stenosis of spine 01/06/2016   thyroid  nodule-benign path on bx 01/2016 01/06/2016   Radicular pain in right arm 12/22/2015   Radicular leg pain 12/22/2015   Back pain 07/06/2015   Constipation 07/06/2015   FH: CAD (coronary artery disease) 05/23/2015   Family history of ischemic heart disease and other diseases of the circulatory system 05/23/2015   Advance care planning 11/26/2014   Pain in joint, shoulder region 11/26/2014   Medicare annual wellness visit, subsequent 09/27/2012   GERD (gastroesophageal reflux disease) 07/05/2012   Irritability 03/29/2011   Advance directive on file 01/09/2011   Pain in joint, other site 01/04/2011   Arthropathy 09/02/2010   NEPHROLITHIASIS, HX OF 09/02/2010   Diabetes mellitus without complication (HCC) 09/01/2010   HLD (hyperlipidemia) 10/24/2009   SNORING 10/24/2009   FOOT PAIN, BILATERAL 10/06/2009   Pes planus 10/06/2009   Acquired flat foot 10/06/2009    PCP: Cleatus Arlyss RAMAN, MD  REFERRING PROVIDER: Genelle Standing, MD  REFERRING DIAG: S46.012A (ICD-10-CM) - 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, initial  encounter;  1. Left shoulder reverse shoulder arthroplasty 2. Left shoulder biceps tenodesis  THERAPY DIAG:  Acute pain of left shoulder  Muscle weakness (generalized)  Stiffness of left shoulder, not elsewhere classified  Rationale for Evaluation and Treatment: Rehabilitation  ONSET DATE: DOS 03/27/24  SUBJECTIVE:                                                                                                                                                                                      SUBJECTIVE STATEMENT: Pt reports that his Lt shoulder is tender but not as sore as last week. He reports he rested it over the weekend (no weights or band exercises, only pulley, heating pad, and self massage).   He reports that he is able to put his  elbow on table now without thinking about it.  He voices frustration with slow progress.   EVAL: Patient states shoulder has been doing good. Using sling. Was very active prior to injury/surgery with gardening/yard work Hand dominance: Right  PERTINENT HISTORY: DM, HLD, chronic shoulder pain with fall in March while exiting ski lift with RC tear  PAIN:  Are you having pain? Yes 1/10 Location: anterior Lt shoulder Description: dull, uncomfortable Worsened by: raising arm to front Relieved by:  massage, icy hot      PRECAUTIONS: Other: L reverse TSA   RED FLAGS: None   WEIGHT BEARING RESTRICTIONS: NWB LUE at eval  FALLS:  Has patient fallen in last 6 months? Yes. Number of falls 1  PLOF: Independent  PATIENT GOALS:regain use of the arm  OBJECTIVE: (objective measures from initial evaluation unless otherwise dated)  OBSERVATION: using sling, waterproof dressing intact  PATIENT SURVEYS:  UEFS  Extreme difficulty/unable (0), Quite a bit of difficulty (1), Moderate difficulty (2), Little difficulty (3), No difficulty (4) Survey date:  03/30/24 06/19/24  Any of your usual work, household or school activities 0   2. Your usual hobbies, recreational/sport activities 1    3. Lifting a bag of groceries to waist level 2    4. Lifting a bag of groceries above your head 0   5. Grooming your hair 4   6. Pushing up on your hands (I.e. from bathtub or chair) 0   7. Preparing food (I.e. peeling/cutting) 1   8. Driving  3   9. Vacuuming, sweeping, or raking 4   10. Dressing  3   11. Doing up buttons 2   12. Using tools/appliances 1   13. Opening doors 0   14. Cleaning  3   15. Tying or lacing shoes 2   16. Sleeping  3   17. Laundering clothes (I.e. washing,  ironing, folding) 3   18. Opening a jar 3   19. Throwing a ball 1   20. Carrying a small suitcase with your affected limb.  1   Score total:  35/80 58/80     05/08/24- UEFS 15/80 via online version, later noted questions  were different than one in epic   COGNITION: Overall cognitive status: Within functional limits for tasks assessed     SENSATION: WFL  POSTURE: rounded shoulders and forward head   UPPER EXTREMITY ROM: AROM RUE WFL  Passive ROM Left eval L 04/06/24 L 04/13/24 L 04/20/24 L 04/24/24 L 04/27/24 L 9/17 L  9/19 L 9/26 L 06/01/24 L 06/05/24 L 06/07/24 L 06/19/24  Shoulder flexion 100 PROM 110 AAROM 110* PROM, 120* AAROM supine  120-130* PROM (bit guarded today) 130* PROM and AROM  114 AROM 140 AAROM 145 AAROM 138 AAROM Improves to 149 AAROM 139 AAROM 148 AAROM improves to 153 AAROM 144* AAROM  130 degrees on shoulder flexion ladder  120 before manual, 135 after in supine, 140 on shoulder flexion ladder  135 deg before manual 140  130 AAROM against gravity   Shoulder extension               Shoulder abduction               Shoulder adduction               Shoulder internal rotation         Mid glute. L5 at best with IR stretch     L5  Shoulder external rotation 20 25* next to body PROM setaed (very guarded), 30* AAROM seated  30* PROM arm next to body  30* PROM arm next to body      30* PROM/AAROM supine arm next to body but limited by anterior shoulder pain     60 deg   Elbow flexion               Elbow extension               Wrist flexion               Wrist extension               Wrist ulnar deviation               Wrist radial deviation               Wrist pronation               Wrist supination               (Blank rows = not tested) *=pain/symptoms  UPPER EXTREMITY MMT: NT due to post op status  MMT Right eval Left eval Left 05/11/24 Left 06/19/24  Shoulder flexion   3 UT compensations  3  Shoulder extension      Shoulder abduction   3 UT compensations  3  Shoulder adduction      Shoulder internal rotation    4-  Shoulder external rotation    4-  Middle trapezius      Lower trapezius      Elbow flexion      Elbow extension      Wrist flexion      Wrist extension       Wrist ulnar deviation      Wrist radial deviation      Wrist pronation      Wrist  supination      Grip strength (lbs)      (Blank rows = not tested) *=pain/symptoms    PALPATION:  LUE gross edema   TODAY'S TREATMENT:                                                                                                                                         DATE:  06/25/24 -UBE L1: 30 sec forward/30 sec backward x 2, towel in small of back for improved posture -Pulleys into flexion x 15, into abdct x 15, towel in small of back for improved posture -Seated - Lt arm slides up incline of table with pillow case x 10 (unable to lift arm off of table), cues for avoiding scap elevation - standing Lt shoulder flexion to reach and lift cone to shoulder height shelf, without and with assist from RUE - tactile cues to avoid scapular elevation - standing shoulder ext AAROM with cane  x10 -Prone row with 1# with tactile cues for scap retraction/depress -Prone Lt shoulder ext to neutral with 1# - with tactile cues for scap retraction/depress -Prone Lt shoulder T's (no Wt)- with tactile cues for scap retraction/depress - Manual therapy: STM to Lt pec major, biceps brachii - modified star gazer pose (back of palm to forehead with elbows out to side)   06/22/24 UBE L1, 1 min forward, 1 min backward, 30 sec each forward/backward Pulleys into flexion x 15, into abdct x 15 Manual therapy: STM to Lt pec major, biceps brachii, upper/mid trap Supine Lt shoulder flexion with scap set x 8, within painfree range Supine Lt pec stretch with arm at 90-100 Prone row with 2# with tactile cues for scap retraction/depress Prone Lt shoulder ext to neutral Prone Lt shoulder T's (no Wt)  06/19/24 Pulleys x 5 min  Reassessment  Standing shoulder flexion in mirror x10 Standing shoulder flexion ladder x10 Standing wall slide with towel and RTB around hands 2x10  06/07/24 Shoulder flexion pulleys x5 min   Posterior/inferior GHJ glides grade II-III L shoulder PROM into flexion/ER  STM to anterior deltoid, biceps, lateral deltoid, and UT Shoulder flexion with dowel x10 SL shoulder abduction with bent elbow 2x10 SL shoulder ER 2x10 Shoulder flexion, abduction, and ER in mirror x5 each  Shoulder flexion circles on ball on wall 2x10  06/05/24 STM and trigger point release to L upper trap, deltoid, and bicep  Grade II-III posterior and inferior mobilizations at L shoulder  Shoulder flexion in supine with dowel 2x10  Shoulder ABCs x2 SL shoulder abduction 2x10  Shoulder IR with dowel behind back 2x10 Shoulder flexion ladder 2x10  06/01/24 Pulleys x5 min STM and trigger point release to L upper trap, deltoid, and bicep  Grade II-III posterior and inferior mobilizations at L shoulder  PROM to L shoulder Shoulder flexion, abduction, ER/IR, extension isometric contractions x10 each with 3 second holds  Shoulder flexion in  supine with dowel x10 Shoulder flexion ladder x10   05/29/24 STM and trigger point release to L upper trap, deltoid, and bicep  Grade II-III posterior and inferior mobilizations at L shoulder  PROM to L shoulder AAROM shoulder flexion 3x10 Supine pec stretch 3x10 seconds   05/25/24 -Pulleys into Rt shoulder flexion - ROM with cane in front of mirror:  bil shoulder ext, IR sliding gently behind back, bil shoulder flexion, Lt scaption - row with scap squeeze 3 sec hold x 12, green band  - bow and arrow (midlevel row) with same side step back, green band x 10 each side - shoulder extension to neutral with scap squeeze with red band x 10 in semi-staggered stance - applied reg KTtape in zigzag pattern over Lt shoulder incision. Pt given verbal instructions on safe removal technique; verbalized understanding  05/22/24 STM and trigger point release to L upper trap, deltoid, and bicep  Massage gun to L upper trap, deltoid, and bicep  Grade II-III posterior and inferior  mobilizations at L shoulder  PROM to L shoulder  05/18/24 STM and trigger point release to L upper trap, deltoid, and bicep. Grade II-III posterior and inferior mobilizations at shoulder  Pulleys x6 min  Serratus punch + full alphabet 3# x2 rounds supine Standing shoulder abduction 2x10 within range without shrugging shoulder  Shoulder flexion with GTB around hands for RC stability 2x10  05/15/24 Pulleys x6 minutes for w/u Wall ladder flexion x12 Scap retractions blue TB x15  Shoulder extensions red TB x15 Standing shoulder flexion to 90* 0# x10 Standing shoulder ABD to 70-80* 0# x10  Serratus punch 3# x12 supine  Serratus punch + CW/CCW circles 3# x15 each way  supine  Serratus punch + full alphabet 3# x2 rounds supine  Wall ball x10 CW/CCW Thoracic extension stretch + shoulder flexion x10  Thoracic lateral flexion stretch x10 B Thoracic rotation stretch x10 B  05/11/24 Pulleys x6 minutes for w/u  ROM, MMT, goals for progress note, education on healing status given recent surgery/benefit of rest days and tapering activity, importance of reducing inflammation in the shoulder  Supine flexion AAROM with dowel x12 IR stretch with sheet to tolerance x12 L stretch at table 10x3 seconds  Supine shoulder flexion 1# 2x10- unable to tolerate 2# today  Held off on sidelying ABD and ER due to pain patterns today  Scap retractions blue TB x15  Shoulder extensions red TB x10   05/08/24 Wall ladder for flexion x15  Pulleys x4 minutes Gentle IR stretch with towel x10 L stretch at high table 10x3 seconds for shoulder flexion Supine shoulder flexion 2# 2x10 Sidelying shoulder ABD 0# 2x12 Supine serratus punches 2# x12 Supine serratus punch + CW/CCW circles x12 each 2# Supine serratus punch 2# alphabet x1  9/19 Manual: Grade II-III inferior glides in flexion, STM deltoid, tricep Supine AAROM flexion with 3# bar 2 x 10 flat table ABCs in flexion 3 sets 1# Sidelying ER 2 x 12 1#   Sidelying abduction 3x10  Standing functional IR strech with cane 10 x 10 second holds gentle Standing row GTB 1x15, progressed to BTB 1x15  05/02/24 Pulley flexion 4 minutes Supine AAROM flexion with 2# bar 2 x 10 flat table Manual: Grade II-III inferior glides in flexion, STM deltoid, tricep Perturbations in flexion 3 x 20 Supine SA punch 3# 2 x 15 Sidelying ER 1 x 12, 1# 1 x 12 Standing  functional IR strech with cane 10 x 10 second holds gentle Standing  row GTB 2x 10 Wall ladder flexion 1 x 10  04/27/24 Pulley flexion 4 minutes Manual: Grade II-III inferior glides in flexion, STM deltoid, tricep Supine AAROM flexion with 1# bar 2 x 10 flat table, 30 degree incline 1# 1 x 10 Prone shoulder extension 1# 3 x 10 Prone row 1# 3 x 10 Wall ladder flexion 1 x 10  PATIENT EDUCATION:  Education details:  HEP, relevant anatomy/biomechanics, protocol/precautions. Person educated: Patient Education method: Explanation, Demonstration,  Education comprehension: verbalized understanding, returned demonstration, verbal cues required, and tactile cues required  HOME EXERCISE PROGRAM:  Access Code: [email or web address] URL: [email or web address] Date: 04/13/2024 Prepared by: Josette Rough  Exercises - Wrist AROM Flexion Extension  - 3 x daily - 7 x weekly - 2 sets - 10 reps - Seated Elbow Flexion and Extension AROM (Mirrored)  - 3 x daily - 7 x weekly - 2 sets - 10 reps - Circular Shoulder Pendulum with Table Support (Mirrored)  - 3 x daily - 7 x weekly - 2 sets - 10 reps - Seated Scapular Retraction  - 3 x daily - 7 x weekly - 2 sets - 10 reps - Seated Shoulder Flexion Towel Slide at Table Top (Mirrored)  - 3 x daily - 7 x weekly - 10 reps - 10 second hold - Supine Shoulder Flexion AAROM with Hands Clasped  - 3 x daily - 7 x weekly - 3 sets - 10 reps - Supine Shoulder External Rotation with Dowel  - 3 x daily - 7 x weekly - 2 sets - 10 reps - Standing Isometric Shoulder Flexion with  Doorway - Arm Bent  - 1 x daily - 7 x weekly - 2 sets - 10 reps - 3 seconds  hold - Standing Isometric Shoulder Abduction with Doorway - Arm Bent  - 1 x daily - 7 x weekly - 2 sets - 10 reps - 3 seconds  hold - Standing Isometric Shoulder Extension with Doorway - Arm Bent  - 1 x daily - 7 x weekly - 2 sets - 10 reps - 3 seconds  hold - Standing Isometric Shoulder External Rotation with Doorway  - 1 x daily - 7 x weekly - 2 sets - 10 reps - 3 seconds  hold   ASSESSMENT:  CLINICAL IMPRESSION: Pt is 12wks and 6days s/p L reverse TSA.  He continues with difficulty lifting LUE into flexion actively above ~90 deg.  Worked on strengthening periscapular muscles with good posture and mechanics within available range with good tolerance.  Pt reported increased pain in Lt shoulder with Lt ER in supine, otherwise all other exercises were tolerated well without pain.  Patient is continuing to progress each visit and remains extremely motivated. Patient will continue to benefit from skilled physical therapy in order to improve function and reduce impairment.    EVAL: Patient a 83 y.o. y.o. male who was seen today for physical therapy evaluation and treatment for L reverse TSA DOS 03/27/24. Patient presents with pain limited deficits in L shoulder strength, ROM, endurance, activity tolerance, and functional mobility with ADL. Patient is having to modify and restrict ADL as indicated by outcome measure score as well as subjective information and objective measures which is affecting overall participation. Patient will benefit from skilled physical therapy in order to improve function and reduce impairment.  OBJECTIVE IMPAIRMENTS: decreased activity tolerance, decreased endurance, decreased mobility, decreased ROM, decreased strength, increased muscle spasms, impaired flexibility, impaired UE functional use, improper body mechanics, and pain  ACTIVITY LIMITATIONS:  carrying, lifting, bending, sleeping, bed mobility,  bathing, dressing, reach over head, hygiene/grooming, and caring for others  PARTICIPATION LIMITATIONS:  meal prep, cleaning, laundry, driving, shopping, community activity, and yard work  PERSONAL FACTORS: Age, Time since onset of injury/illness/exacerbation, and 1-2 comorbidities: DM, HLD are also affecting patient's functional outcome.   REHAB POTENTIAL: Good  CLINICAL DECISION MAKING: Evolving/moderate complexity  EVALUATION COMPLEXITY: Moderate  GOALS: Goals reviewed with patient? Yes  SHORT TERM GOALS: Target date: 05/11/2024    Patient will be independent with HEP in order to improve functional outcomes. Baseline: Goal status: MET 06/19/24  2.  Patient will report at least 25% improvement in symptoms for improved quality of life. Baseline:  Goal status: MET 06/19/24  3.  Patient will demonstrate Elevation to 130 deg and ER to 30 deg - passive, active assisted or active  Baseline:  Goal status: MET 04/20/24  4.  Patient will be out of sling per surgeon approval for improved functional use of L shoulder. Baseline:  Goal status: MET 04/13/24   LONG TERM GOALS: Target date: 09/11/2024   Patient will report at least 75% improvement in symptoms for improved quality of life. Baseline:  Goal status: IN PROGRESS (65% 06/19/24)  2.  Patient will improve UEFS score by at least 25 points in order to indicate improved tolerance to activity. Baseline:  Goal status: IN PROGRESS 06/19/24  3.  Patient will demonstrate at least PROM/AAROM/AROM to: Elevation - 145-160; ER - 40-50 ; functional IR to L1 in L shoulder for improved ability lift overhead. Baseline:  Goal status: IN PROGRESS 06/19/24  4.  Patient will be able to return to all activities unrestricted for improved ability to perform yard work functions and participate with family.  Baseline:  Goal status: IN PROGRESS 06/19/24  5.  Patient will demonstrate grade of 5/5 MMT grade in all tested musculature as evidence of improved  strength to assist with lifting at home. Baseline:  Goal status: IN PROGRESS 06/19/24   PLAN:  PT FREQUENCY: 1-2x/week  PT DURATION: 12 weeks  PLANNED INTERVENTIONS:[id number]- PT Re-evaluation, 97110-Therapeutic exercises, 97530- Therapeutic activity, 97112- Neuromuscular re-education, 97535- Self Care, [id number]- Manual therapy, [phone]- Gait training, [phone]- Orthotic Fit/training, [phone]- Canalith repositioning, [id number]- Aquatic Therapy, [phone]- Splinting, [phone]- Wound care (first 20 sq cm), 97598- Wound care (each additional 20 sq cm)Patient/Family education, Balance training, Stair training, Taping, Dry Needling, Joint mobilization, Joint manipulation, Spinal manipulation, Spinal mobilization, Scar mobilization, and DME instructions.  PLAN FOR NEXT SESSION: progress per protocol

## 2024-06-27 ENCOUNTER — Ambulatory Visit (INDEPENDENT_AMBULATORY_CARE_PROVIDER_SITE_OTHER): Admitting: Orthopaedic Surgery

## 2024-06-27 ENCOUNTER — Ambulatory Visit (INDEPENDENT_AMBULATORY_CARE_PROVIDER_SITE_OTHER)

## 2024-06-27 DIAGNOSIS — G8929 Other chronic pain: Secondary | ICD-10-CM

## 2024-06-27 DIAGNOSIS — M25512 Pain in left shoulder: Secondary | ICD-10-CM

## 2024-06-27 DIAGNOSIS — Z96619 Presence of unspecified artificial shoulder joint: Secondary | ICD-10-CM | POA: Diagnosis not present

## 2024-06-27 DIAGNOSIS — Z471 Aftercare following joint replacement surgery: Secondary | ICD-10-CM | POA: Diagnosis not present

## 2024-06-27 NOTE — Progress Notes (Signed)
 Post Operative Evaluation    Procedure/Date of Surgery: Left shoulder reverse shoulder arthroplasty 8/12  Interval History:    Presents 12 weeks status post reverse shoulder arthroplasty.  Overall he is doing quite well.  He is continuing to work on strengthening and range of motion   PMH/PSH/Family History/Social History/Meds/Allergies:    Past Medical History:  Diagnosis Date   Abnormal GGT test    With otherwise normal work up.   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)    Persistent disorder of initiating or maintaining sleep    Pes planus    Rosacea    Snoring    Past Surgical History:  Procedure Laterality Date   APPENDECTOMY  1949   at age 78   CERVICAL FUSION  03/2016   C 3&4   Cyst removed from breast  1960   FOOT SURGERY  01/2009   Left   HERNIA REPAIR  1956   at age 70 (single)   HERNIA REPAIR  1960   (double)   Left 2nd toe removed  03/2017   REVERSE SHOULDER ARTHROPLASTY Left 03/27/2024   Procedure: ARTHROPLASTY, SHOULDER, TOTAL, REVERSE;  Surgeon: Genelle Standing, MD;  Location: [facility];  Service: Orthopedics;  Laterality: Left;   SPINE SURGERY     TONSILLECTOMY AND ADENOIDECTOMY  ~ 1950   VASECTOMY  ~ 1984   Social History   Socioeconomic History   Marital status: Married    Spouse name: Not on file   Number of children: 4   Years of education: Not on file   Highest education level: Master's degree (e.g., MA, MS, MEng, MEd, MSW, MBA)  Occupational History   Occupation: Retired Nov. 2007 27-1/2 years of service    Employer: RETIRED    Comment: Paediatric Nurse   Occupation: Works part time at a desk job.  Tobacco Use   Smoking status: Never    Smokeless tobacco: Former    Types: Engineer, Drilling   Vaping status: Never Used  Substance and Sexual Activity   Alcohol use: Yes    Comment: 1-2 glass wine daily   Drug use: No   Sexual activity: Not Currently  Other Topics Concern   Not on file  Social History Narrative   Education:  Masters   Charity Fundraiser at 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   4 kids, 2 are MD's   7 grandkids   [employer] and stays active, enjoys skiing, very active, walks at least 1/2 mile daily   Social Drivers of Corporate Investment Banker Strain: Low Risk  (03/19/2024)   Overall Financial Resource Strain (CARDIA)    Difficulty of Paying Living Expenses: Not very hard  Food Insecurity: No Food Insecurity (03/19/2024)   Hunger Vital Sign    Worried About Running Out of Food in the Last Year: Never true    Ran Out of Food in the Last Year: Never true  Transportation Needs: No Transportation Needs (03/19/2024)   PRAPARE -  Administrator, Civil Service (Medical): No    Lack of Transportation (Non-Medical): No  Physical Activity: Sufficiently Active (03/19/2024)   Exercise Vital Sign    Days of Exercise per Week: 7 days    Minutes of Exercise per Session: 30 min  Stress: No Stress Concern Present (03/19/2024)   [employer] of Occupational Health - Occupational Stress Questionnaire    Feeling of Stress: Not at all  Social Connections: Moderately Isolated (03/19/2024)   Social Connection and Isolation Panel    Frequency of Communication with Friends and Family: Twice a week    Frequency of Social Gatherings with Friends and Family: Once a week    Attends Religious Services: Never    Database Administrator or Organizations: No    Attends Engineer, Structural: Not on file    Marital Status: Married   Family History  Problem Relation Age of Onset   Heart disease Father 34       First MI   Hyperlipidemia Father    Heart attack Father    Diabetes Brother    Hyperlipidemia  Brother    Hypertension Brother    Heart disease Brother        CAD s/p stent   CAD Brother    Heart disease Brother    Heart disease Brother    Diabetes Sister        DM2   Hyperlipidemia Sister    Heart disease Sister    Arthritis Other    Diabetes Other    ADD / ADHD Son    Depression Sister    Prostate cancer Neg Hx    Colon cancer Neg Hx    Allergies  Allergen Reactions   Azo [Phenazopyridine] Other (See Comments)    diarrhea   Bee Venom    Erythromycin     Oral ulcers (but tolerates zithromax)    Animator (Solenopsis Invicta) Swelling   Oxycodone      Intolerant- but can tolerate hydrocodone .    Pneumococcal Vaccine Polyvalent     REACTION: rash   Wasp Venom     Exaggerated local reaction   Current Outpatient Medications  Medication Sig Dispense Refill   Accu-Chek FastClix Lancets MISC USE AS NEEDED TO CHECK SUGAR DAILY. 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 102 each 3   aspirin  81 MG EC tablet Take 81 mg by mouth daily.     aspirin  EC 325 MG tablet Take 1 tablet (325 mg total) by mouth daily. (Patient not taking: Reported on 03/20/2024) 14 tablet 0   B Complex-C-Folic Acid (SUPER B COMPLEX/FA/VIT C) TABS      Blood Glucose Monitoring Suppl (ONETOUCH VERIO) w/Device KIT Use to check blood sugar up to 3 times a day. Dx: E11.9 1 kit 0   EPINEPHrine  0.3 mg/0.3 mL IJ SOAJ injection Inject 0.3 mLs (0.3 mg total) into the muscle once. (Patient not taking: Reported on 03/20/2024) 2 Device 1   fluorouracil  (EFUDEX ) 5 % cream Apply topically 2 (two) times daily.     glimepiride  (AMARYL ) 2 MG tablet TAKE 1 TABLET BY MOUTH DAILY  WITH BREAKFAST 90 tablet 3   HYDROcodone -acetaminophen  (NORCO/VICODIN) 5-325 MG tablet Take 1 tablet by mouth every 6 (six) hours as needed for moderate pain (pain score 4-6). 20 tablet 0   insulin  glargine (LANTUS  SOLOSTAR) 100 UNIT/ML Solostar Pen Inject 7 Units into the skin daily.     Insulin  Pen Needle 30G X 5 MM MISC Use daily with insulin   pen.  100 each 3   metFORMIN  (GLUCOPHAGE ) 1000 MG tablet Take 1 tablet (1,000 mg total) by mouth daily with breakfast. 180 tablet 3   metFORMIN  (GLUCOPHAGE -XR) 500 MG 24 hr tablet Take 2 tablets (1,000 mg total) by mouth at bedtime. 180 tablet 1   nitroGLYCERIN  (NITROSTAT ) 0.4 MG SL tablet Place 1 tablet (0.4 mg total) under the tongue every 5 (five) minutes as needed for chest pain (max 3 doses/15 min.call MD/to ER if used). 25 tablet 3   ONETOUCH VERIO test strip USE TO CHECK BLOOD SUGAR UP TO 3 TIMES DAILY. DX E11.9 100 strip 25   rosuvastatin  (CRESTOR ) 10 MG tablet Take 1 tablet (10 mg total) by mouth daily. 90 tablet 3   sertraline  (ZOLOFT ) 50 MG tablet Take 1 tablet (50 mg total) by mouth daily. 90 tablet 3   sitaGLIPtin  (JANUVIA ) 100 MG tablet Take 1 tablet (100 mg total) by mouth daily. 90 tablet 3   No current facility-administered medications for this visit.   No results found.  Review of Systems:   A ROS was performed including pertinent positives and negatives as documented in the HPI.   Musculoskeletal Exam:    There were no vitals taken for this visit.  Left shoulder is well-appearing without erythema or drainage.  Active forward elevation is 120 degrees external rotation at side is 20 degrees internal rotation deferred today distal neurosensory exam is intact  Imaging:    3 views left shoulder: This post left reverse shoulder arthroplasty without evidence of complication  I personally reviewed and interpreted the radiographs.   Assessment:   12 weeks status post left reverse shoulder arthroplasty overall doing extremely well.  Overall he is continuing to improve with in terms of strength and range of motion.  I will plan to see him back in 12 weeks for reassessment  Plan :    - return to clinic 12 weeks for reassessment      I personally saw and evaluated the patient, and participated in the management and treatment plan.  Elspeth Parker, MD Attending Physician,  Orthopedic Surgery  This document was dictated using Dragon voice recognition software. A reasonable attempt at proof reading has been made to minimize errors.

## 2024-07-02 ENCOUNTER — Other Ambulatory Visit: Payer: Self-pay | Admitting: Medical Genetics

## 2024-07-03 ENCOUNTER — Encounter (HOSPITAL_BASED_OUTPATIENT_CLINIC_OR_DEPARTMENT_OTHER): Payer: Self-pay | Admitting: Physical Therapy

## 2024-07-03 ENCOUNTER — Ambulatory Visit (HOSPITAL_BASED_OUTPATIENT_CLINIC_OR_DEPARTMENT_OTHER): Admitting: Physical Therapy

## 2024-07-03 DIAGNOSIS — M25512 Pain in left shoulder: Secondary | ICD-10-CM | POA: Diagnosis not present

## 2024-07-03 DIAGNOSIS — M25612 Stiffness of left shoulder, not elsewhere classified: Secondary | ICD-10-CM | POA: Diagnosis not present

## 2024-07-03 DIAGNOSIS — M6281 Muscle weakness (generalized): Secondary | ICD-10-CM | POA: Diagnosis not present

## 2024-07-03 DIAGNOSIS — R29898 Other symptoms and signs involving the musculoskeletal system: Secondary | ICD-10-CM

## 2024-07-03 NOTE — Therapy (Signed)
 OUTPATIENT PHYSICAL THERAPY SHOULDER TREATMENT NOTE   Patient Name: Greg Rogers. MRN: [id number] DOB:November 23, 1940, 83 y.o., male Today's Date: 07/03/2024  END OF SESSION:  PT End of Session - 07/03/24 0855     Visit Number 21    Number of Visits 48    Date for Recertification  09/11/24    Authorization Type MCR    Progress Note Due on Visit 28    PT Start Time 0847    PT Stop Time 0926    PT Time Calculation (min) 39 min    Activity Tolerance Patient tolerated treatment well    Behavior During Therapy WFL for tasks assessed/performed          Past Medical History:  Diagnosis Date   Abnormal GGT test    With otherwise normal work up.   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)    Persistent disorder of initiating or maintaining sleep    Pes planus    Rosacea    Snoring    Past Surgical History:  Procedure Laterality Date   APPENDECTOMY  1949   at age 59   CERVICAL FUSION  03/2016   C 3&4   Cyst removed from breast  1960   FOOT SURGERY  01/2009   Left   HERNIA REPAIR  1956   at age 36 (single)   HERNIA REPAIR  1960   (double)   Left 2nd toe removed  03/2017   REVERSE SHOULDER ARTHROPLASTY Left 03/27/2024   Procedure: ARTHROPLASTY, SHOULDER, TOTAL, REVERSE;  Surgeon: Genelle Standing, MD;  Location: [facility];  Service: Orthopedics;  Laterality: Left;   SPINE SURGERY     TONSILLECTOMY AND ADENOIDECTOMY  ~ 1950   VASECTOMY  ~ 1984   Patient Active Problem List   Diagnosis Date Noted   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 03/27/2024   Leg pain 03/21/2024   Shoulder pain 12/07/2023   Change in stool 11/02/2023   Nasal lesion 06/02/2023   Occipital neuralgia 10/12/2020   Wound of right leg, initial encounter  07/03/2019   Healthcare maintenance 03/04/2017   Foot pain 07/22/2016   Cervical stenosis of spine 01/06/2016   thyroid  nodule-benign path on bx 01/2016 01/06/2016   Radicular pain in right arm 12/22/2015   Radicular leg pain 12/22/2015   Back pain 07/06/2015   Constipation 07/06/2015   FH: CAD (coronary artery disease) 05/23/2015   Family history of ischemic heart disease and other diseases of the circulatory system 05/23/2015   Advance care planning 11/26/2014   Pain in joint, shoulder region 11/26/2014   Medicare annual wellness visit, subsequent 09/27/2012   GERD (gastroesophageal reflux disease) 07/05/2012   Irritability 03/29/2011   Advance directive on file 01/09/2011   Pain in joint, other site 01/04/2011   Arthropathy 09/02/2010   NEPHROLITHIASIS, HX OF 09/02/2010   Diabetes mellitus without complication (HCC) 09/01/2010   HLD (hyperlipidemia) 10/24/2009   SNORING 10/24/2009   FOOT PAIN, BILATERAL 10/06/2009   Pes planus 10/06/2009   Acquired flat foot 10/06/2009    PCP: Cleatus Arlyss RAMAN, MD  REFERRING PROVIDER: Genelle Standing, MD  REFERRING DIAG: S46.012A (ICD-10-CM) - 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,  initial encounter;  1. Left shoulder reverse shoulder arthroplasty 2. Left shoulder biceps tenodesis  THERAPY DIAG:  Acute pain of left shoulder  Muscle weakness (generalized)  Stiffness of left shoulder, not elsewhere classified  Other symptoms and signs involving the musculoskeletal system  Rationale for Evaluation and Treatment: Rehabilitation  ONSET DATE: DOS 03/27/24  SUBJECTIVE:                                                                                                                                                                                      SUBJECTIVE STATEMENT:  In the past week its been feeling much better but I still have one muscle that tweaks all the time, its tender. Other than that everything is fine. Raising L arm  up is still hard, I can do it most of the time but its difficult and feels very weak   EVAL: Patient states shoulder has been doing good. Using sling. Was very active prior to injury/surgery with gardening/yard work Hand dominance: Right  PERTINENT HISTORY: DM, HLD, chronic shoulder pain with fall in March while exiting ski lift with RC tear  PAIN:  Are you having pain? Yes 1/10 Location: anterior Lt shoulder Description: dull, uncomfortable Worsened by: raising arm to front Relieved by:  massage, icy hot      PRECAUTIONS: Other: L reverse TSA   RED FLAGS: None   WEIGHT BEARING RESTRICTIONS: NWB LUE at eval  FALLS:  Has patient fallen in last 6 months? Yes. Number of falls 1  PLOF: Independent  PATIENT GOALS:regain use of the arm  OBJECTIVE: (objective measures from initial evaluation unless otherwise dated)  OBSERVATION: using sling, waterproof dressing intact  PATIENT SURVEYS:  UEFS  Extreme difficulty/unable (0), Quite a bit of difficulty (1), Moderate difficulty (2), Little difficulty (3), No difficulty (4) Survey date:  03/30/24 06/19/24  Any of your usual work, household or school activities 0   2. Your usual hobbies, recreational/sport activities 1    3. Lifting a bag of groceries to waist level 2    4. Lifting a bag of groceries above your head 0   5. Grooming your hair 4   6. Pushing up on your hands (I.e. from bathtub or chair) 0   7. Preparing food (I.e. peeling/cutting) 1   8. Driving  3   9. Vacuuming, sweeping, or raking 4   10. Dressing  3   11. Doing up buttons 2   12. Using tools/appliances 1   13. Opening doors 0   14. Cleaning  3   15. Tying or lacing shoes 2   16. Sleeping  3   17. Laundering clothes (I.e. washing,  ironing, folding) 3   18. Opening a jar 3   19. Throwing a ball 1   20. Carrying a small suitcase with your affected limb.  1   Score total:  35/80 58/80     05/08/24- UEFS 15/80 via online version, later noted questions were  different than one in epic   COGNITION: Overall cognitive status: Within functional limits for tasks assessed     SENSATION: WFL  POSTURE: rounded shoulders and forward head   UPPER EXTREMITY ROM: AROM RUE WFL  Passive ROM Left eval L 04/06/24 L 04/13/24 L 04/20/24 L 04/24/24 L 04/27/24 L 9/17 L  9/19 L 9/26 L 06/01/24 L 06/05/24 L 06/07/24 L 06/19/24  Shoulder flexion 100 PROM 110 AAROM 110* PROM, 120* AAROM supine  120-130* PROM (bit guarded today) 130* PROM and AROM  114 AROM 140 AAROM 145 AAROM 138 AAROM Improves to 149 AAROM 139 AAROM 148 AAROM improves to 153 AAROM 144* AAROM  130 degrees on shoulder flexion ladder  120 before manual, 135 after in supine, 140 on shoulder flexion ladder  135 deg before manual 140  130 AAROM against gravity   Shoulder extension               Shoulder abduction               Shoulder adduction               Shoulder internal rotation         Mid glute. L5 at best with IR stretch     L5  Shoulder external rotation 20 25* next to body PROM setaed (very guarded), 30* AAROM seated  30* PROM arm next to body  30* PROM arm next to body      30* PROM/AAROM supine arm next to body but limited by anterior shoulder pain     60 deg   Elbow flexion               Elbow extension               Wrist flexion               Wrist extension               Wrist ulnar deviation               Wrist radial deviation               Wrist pronation               Wrist supination               (Blank rows = not tested) *=pain/symptoms  UPPER EXTREMITY MMT: NT due to post op status  MMT Right eval Left eval Left 05/11/24 Left 06/19/24  Shoulder flexion   3 UT compensations  3  Shoulder extension      Shoulder abduction   3 UT compensations  3  Shoulder adduction      Shoulder internal rotation    4-  Shoulder external rotation    4-  Middle trapezius      Lower trapezius      Elbow flexion      Elbow extension      Wrist flexion      Wrist extension      Wrist  ulnar deviation      Wrist radial deviation      Wrist pronation      Wrist  supination      Grip strength (lbs)      (Blank rows = not tested) *=pain/symptoms    PALPATION:  LUE gross edema   TODAY'S TREATMENT:                                                                                                                                         DATE:    07/03/24  Pulleys for w/u and ROM x6 minutes   Prone shoulder extensions 2# x12 cues for scapular retraction/controlled motion  Prone shoulder Ts 0# 2x12 cues for scapular retraction/depression and not rolling body to compensate for lack of ROM Prone rows 2# 2x12 cues for quality scap retraction Prone scap retraction + shoulder flexion against gravity 0# 2x12 Stargazer stretch 120 seconds supine  Towel stretch for IR standing 5x10 seconds L Scap retractions blue TB focus on quality scap retraction and controlled movement x15   Education on progressions towards more strength training as appropriate per protocol, also encouraged him to pay attention to if any activities cause shoulder pain and to take rest/reduce intensity as appropriate.    06/25/24 -UBE L1: 30 sec forward/30 sec backward x 2, towel in small of back for improved posture -Pulleys into flexion x 15, into abdct x 15, towel in small of back for improved posture -Seated - Lt arm slides up incline of table with pillow case x 10 (unable to lift arm off of table), cues for avoiding scap elevation - standing Lt shoulder flexion to reach and lift cone to shoulder height shelf, without and with assist from RUE - tactile cues to avoid scapular elevation - standing shoulder ext AAROM with cane  x10 -Prone row with 1# with tactile cues for scap retraction/depress -Prone Lt shoulder ext to neutral with 1# - with tactile cues for scap retraction/depress -Prone Lt shoulder T's (no Wt)- with tactile cues for scap retraction/depress - Manual therapy: STM to Lt pec major, biceps  brachii - modified star gazer pose (back of palm to forehead with elbows out to side)   06/22/24 UBE L1, 1 min forward, 1 min backward, 30 sec each forward/backward Pulleys into flexion x 15, into abdct x 15 Manual therapy: STM to Lt pec major, biceps brachii, upper/mid trap Supine Lt shoulder flexion with scap set x 8, within painfree range Supine Lt pec stretch with arm at 90-100 Prone row with 2# with tactile cues for scap retraction/depress Prone Lt shoulder ext to neutral Prone Lt shoulder T's (no Wt)  06/19/24 Pulleys x 5 min  Reassessment  Standing shoulder flexion in mirror x10 Standing shoulder flexion ladder x10 Standing wall slide with towel and RTB around hands 2x10  06/07/24 Shoulder flexion pulleys x5 min  Posterior/inferior GHJ glides grade II-III L shoulder PROM into flexion/ER  STM to anterior deltoid, biceps, lateral deltoid, and UT Shoulder flexion with dowel x10 SL shoulder abduction with bent  elbow 2x10 SL shoulder ER 2x10 Shoulder flexion, abduction, and ER in mirror x5 each  Shoulder flexion circles on ball on wall 2x10  06/05/24 STM and trigger point release to L upper trap, deltoid, and bicep  Grade II-III posterior and inferior mobilizations at L shoulder  Shoulder flexion in supine with dowel 2x10  Shoulder ABCs x2 SL shoulder abduction 2x10  Shoulder IR with dowel behind back 2x10 Shoulder flexion ladder 2x10  06/01/24 Pulleys x5 min STM and trigger point release to L upper trap, deltoid, and bicep  Grade II-III posterior and inferior mobilizations at L shoulder  PROM to L shoulder Shoulder flexion, abduction, ER/IR, extension isometric contractions x10 each with 3 second holds  Shoulder flexion in supine with dowel x10 Shoulder flexion ladder x10   05/29/24 STM and trigger point release to L upper trap, deltoid, and bicep  Grade II-III posterior and inferior mobilizations at L shoulder  PROM to L shoulder AAROM shoulder flexion  3x10 Supine pec stretch 3x10 seconds   05/25/24 -Pulleys into Rt shoulder flexion - ROM with cane in front of mirror:  bil shoulder ext, IR sliding gently behind back, bil shoulder flexion, Lt scaption - row with scap squeeze 3 sec hold x 12, green band  - bow and arrow (midlevel row) with same side step back, green band x 10 each side - shoulder extension to neutral with scap squeeze with red band x 10 in semi-staggered stance - applied reg KTtape in zigzag pattern over Lt shoulder incision. Pt given verbal instructions on safe removal technique; verbalized understanding    PATIENT EDUCATION:  Education details:  HEP, relevant anatomy/biomechanics, protocol/precautions. Person educated: Patient Education method: Explanation, Demonstration,  Education comprehension: verbalized understanding, returned demonstration, verbal cues required, and tactile cues required  HOME EXERCISE PROGRAM:  Access Code: [email or web address] URL: [email or web address] Date: 04/13/2024 Prepared by: Josette Rough  Exercises - Wrist AROM Flexion Extension  - 3 x daily - 7 x weekly - 2 sets - 10 reps - Seated Elbow Flexion and Extension AROM (Mirrored)  - 3 x daily - 7 x weekly - 2 sets - 10 reps - Circular Shoulder Pendulum with Table Support (Mirrored)  - 3 x daily - 7 x weekly - 2 sets - 10 reps - Seated Scapular Retraction  - 3 x daily - 7 x weekly - 2 sets - 10 reps - Seated Shoulder Flexion Towel Slide at Table Top (Mirrored)  - 3 x daily - 7 x weekly - 10 reps - 10 second hold - Supine Shoulder Flexion AAROM with Hands Clasped  - 3 x daily - 7 x weekly - 3 sets - 10 reps - Supine Shoulder External Rotation with Dowel  - 3 x daily - 7 x weekly - 2 sets - 10 reps - Standing Isometric Shoulder Flexion with Doorway - Arm Bent  - 1 x daily - 7 x weekly - 2 sets - 10 reps - 3 seconds  hold - Standing Isometric Shoulder Abduction with Doorway - Arm Bent  - 1 x daily - 7 x weekly - 2 sets - 10 reps - 3  seconds  hold - Standing Isometric Shoulder Extension with Doorway - Arm Bent  - 1 x daily - 7 x weekly - 2 sets - 10 reps - 3 seconds  hold - Standing Isometric Shoulder External Rotation with Doorway  - 1 x daily - 7 x weekly - 2 sets - 10 reps - 3 seconds  hold   ASSESSMENT:  CLINICAL IMPRESSION:  Arrives today doing well, he had some questions on when we can return to weight lifting since he's so far out from surgery- educated on timeline and precautions with  his procedure. Continued working on functional strengthening today as appropriate, he is really doing well. Will continue to progress him as able and tolerated. 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    EVAL: Patient a 83 y.o. y.o. male who was seen today for physical therapy evaluation and treatment for L reverse TSA DOS 03/27/24. Patient presents with pain limited deficits in L shoulder strength, ROM, endurance, activity tolerance, and functional mobility with ADL. Patient is having to modify and restrict ADL as indicated by outcome measure score as well as subjective information and objective measures which is affecting overall participation. Patient will benefit from skilled physical therapy in order to improve function and reduce impairment.  OBJECTIVE IMPAIRMENTS: decreased activity tolerance, decreased endurance, decreased mobility, decreased ROM, decreased strength, increased muscle spasms, impaired flexibility, impaired UE functional use, improper body mechanics, and pain  ACTIVITY LIMITATIONS:  carrying, lifting, bending, sleeping, bed mobility, bathing, dressing, reach over head, hygiene/grooming, and caring for others  PARTICIPATION LIMITATIONS:  meal prep, cleaning, laundry, driving, shopping, community activity, and yard  work  PERSONAL FACTORS: Age, Time since onset of injury/illness/exacerbation, and 1-2 comorbidities: DM, HLD are also affecting patient's functional outcome.   REHAB POTENTIAL: Good  CLINICAL DECISION MAKING: Evolving/moderate complexity  EVALUATION COMPLEXITY: Moderate  GOALS: Goals reviewed with patient? Yes  SHORT TERM GOALS: Target date: 05/11/2024    Patient will be independent with HEP in order to improve functional outcomes. Baseline: Goal status: MET 06/19/24  2.  Patient will report at least 25% improvement in symptoms for improved quality of life. Baseline:  Goal status: MET 06/19/24  3.  Patient will demonstrate Elevation to 130 deg and ER to 30 deg - passive, active assisted or active  Baseline:  Goal status: MET 04/20/24  4.  Patient will be out of sling per surgeon approval for improved functional use of L shoulder. Baseline:  Goal status: MET 04/13/24   LONG TERM GOALS: Target date: 09/11/2024   Patient will report at least 75% improvement in symptoms for improved quality of life. Baseline:  Goal status: IN PROGRESS (65% 06/19/24)  2.  Patient will improve UEFS score by at least 25 points in order to indicate improved tolerance to activity. Baseline:  Goal status: IN PROGRESS 06/19/24  3.  Patient will demonstrate at least PROM/AAROM/AROM to: Elevation - 145-160; ER - 40-50 ; functional IR to L1 in L shoulder for improved ability lift overhead. Baseline:  Goal status: IN PROGRESS 06/19/24  4.  Patient will be able to return to all activities unrestricted for improved ability to perform yard work functions and participate with family.  Baseline:  Goal status: IN PROGRESS 06/19/24  5.  Patient will demonstrate grade of 5/5 MMT grade in all tested musculature as evidence of improved strength to assist with lifting at home. Baseline:  Goal status: IN PROGRESS 06/19/24   PLAN:  PT FREQUENCY: 1-2x/week  PT DURATION: 12 weeks  PLANNED INTERVENTIONS:[id number]- PT  Re-evaluation, 97110-Therapeutic exercises, 97530- Therapeutic activity, [id number]- Neuromuscular re-education, 97535- Self Care, [id number]- Manual therapy, [id number]- Gait training, [phone]- Orthotic Fit/training, [phone]- Canalith repositioning, [id number]-  Aquatic Therapy, [id number]- Splinting, 02402- Wound care (first 20 sq cm), 97598- Wound care (each additional 20 sq cm)Patient/Family education, Balance training, Stair training, Taping, Dry Needling, Joint mobilization, Joint manipulation, Spinal manipulation, Spinal mobilization, Scar mobilization, and DME instructions.  PLAN FOR NEXT SESSION: progress per protocol, progress PREs as appropriate/tolerated   Josette Rough, PT, DPT 07/03/24 9:28 AM

## 2024-07-05 ENCOUNTER — Ambulatory Visit (HOSPITAL_BASED_OUTPATIENT_CLINIC_OR_DEPARTMENT_OTHER): Admitting: Physical Therapy

## 2024-07-05 ENCOUNTER — Encounter (HOSPITAL_BASED_OUTPATIENT_CLINIC_OR_DEPARTMENT_OTHER): Payer: Self-pay | Admitting: Physical Therapy

## 2024-07-05 DIAGNOSIS — M6281 Muscle weakness (generalized): Secondary | ICD-10-CM | POA: Diagnosis not present

## 2024-07-05 DIAGNOSIS — R29898 Other symptoms and signs involving the musculoskeletal system: Secondary | ICD-10-CM | POA: Diagnosis not present

## 2024-07-05 DIAGNOSIS — M25612 Stiffness of left shoulder, not elsewhere classified: Secondary | ICD-10-CM

## 2024-07-05 DIAGNOSIS — M25512 Pain in left shoulder: Secondary | ICD-10-CM | POA: Diagnosis not present

## 2024-07-05 NOTE — Therapy (Signed)
 OUTPATIENT PHYSICAL THERAPY SHOULDER TREATMENT NOTE   Patient Name: Greg Rogers. MRN: [id number] DOB:[birth year], 83 y.o., male Today's Date: 07/05/2024  END OF SESSION:  PT End of Session - 07/05/24 0805     Visit Number 22    Number of Visits 48    Date for Recertification  09/11/24    Authorization Type MCR    Progress Note Due on Visit 28    PT Start Time 0801    PT Stop Time 0840    PT Time Calculation (min) 39 min    Activity Tolerance Patient tolerated treatment well          Past Medical History:  Diagnosis Date   Abnormal GGT test    With otherwise normal work up.   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)    Persistent disorder of initiating or maintaining sleep    Pes planus    Rosacea    Snoring    Past Surgical History:  Procedure Laterality Date   APPENDECTOMY  1949   at age 66   CERVICAL FUSION  03/2016   C 3&4   Cyst removed from breast  1960   FOOT SURGERY  01/2009   Left   HERNIA REPAIR  1956   at age 44 (single)   HERNIA REPAIR  1960   (double)   Left 2nd toe removed  03/2017   REVERSE SHOULDER ARTHROPLASTY Left 03/27/2024   Procedure: ARTHROPLASTY, SHOULDER, TOTAL, REVERSE;  Surgeon: Genelle Standing, MD;  Location: [facility];  Service: Orthopedics;  Laterality: Left;   SPINE SURGERY     TONSILLECTOMY AND ADENOIDECTOMY  ~ 1950   VASECTOMY  ~ 1984   Patient Active Problem List   Diagnosis Date Noted   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 03/27/2024   Leg pain 03/21/2024   Shoulder pain 12/07/2023   Change in stool 11/02/2023   Nasal lesion 06/02/2023   Occipital neuralgia 10/12/2020   Wound of right leg, initial encounter 07/03/2019   Healthcare maintenance 03/04/2017   Foot pain  07/22/2016   Cervical stenosis of spine 01/06/2016   thyroid  nodule-benign path on bx 01/2016 01/06/2016   Radicular pain in right arm 12/22/2015   Radicular leg pain 12/22/2015   Back pain 07/06/2015   Constipation 07/06/2015   FH: CAD (coronary artery disease) 05/23/2015   Family history of ischemic heart disease and other diseases of the circulatory system 05/23/2015   Advance care planning 11/26/2014   Pain in joint, shoulder region 11/26/2014   Medicare annual wellness visit, subsequent 09/27/2012   GERD (gastroesophageal reflux disease) 07/05/2012   Irritability 03/29/2011   Advance directive on file 01/09/2011   Pain in joint, other site 01/04/2011   Arthropathy 09/02/2010   NEPHROLITHIASIS, HX OF 09/02/2010   Diabetes mellitus without complication (HCC) 09/01/2010   HLD (hyperlipidemia) 10/24/2009   SNORING 10/24/2009   FOOT PAIN, BILATERAL 10/06/2009   Pes planus 10/06/2009   Acquired flat foot 10/06/2009    PCP: Cleatus Arlyss RAMAN, MD  REFERRING PROVIDER: Genelle Standing, MD  REFERRING DIAG: S46.012A (ICD-10-CM) - 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, initial encounter;  1. Left shoulder reverse shoulder arthroplasty 2.  Left shoulder biceps tenodesis  THERAPY DIAG:  Acute pain of left shoulder  Muscle weakness (generalized)  Stiffness of left shoulder, not elsewhere classified  Other symptoms and signs involving the musculoskeletal system  Rationale for Evaluation and Treatment: Rehabilitation  ONSET DATE: DOS 03/27/24  SUBJECTIVE:                                                                                                                                                                                      SUBJECTIVE STATEMENT: I cut 5 trees down yesterday and dragged them around.   Pt complains of tenderness in Lt ant prox upper arm, If I could get rid of that I would be golden  EVAL: Patient states shoulder has been doing good. Using sling.  Was very active prior to injury/surgery with gardening/yard work Hand dominance: Right  PERTINENT HISTORY: DM, HLD, chronic shoulder pain with fall in March while exiting ski lift with RC tear  PAIN:  Are you having pain? Yes 2/10 Location: anterior Lt shoulder/upper arm Description: dull, uncomfortable Worsened by: raising arm to front Relieved by:  massage, icy hot      PRECAUTIONS: Other: L reverse TSA   RED FLAGS: None   WEIGHT BEARING RESTRICTIONS: NWB LUE at eval  FALLS:  Has patient fallen in last 6 months? Yes. Number of falls 1  PLOF: Independent  PATIENT GOALS:regain use of the arm  OBJECTIVE: (objective measures from initial evaluation unless otherwise dated)  OBSERVATION: using sling, waterproof dressing intact  PATIENT SURVEYS:  UEFS  Extreme difficulty/unable (0), Quite a bit of difficulty (1), Moderate difficulty (2), Little difficulty (3), No difficulty (4) Survey date:  03/30/24 06/19/24  Any of your usual work, household or school activities 0   2. Your usual hobbies, recreational/sport activities 1    3. Lifting a bag of groceries to waist level 2    4. Lifting a bag of groceries above your head 0   5. Grooming your hair 4   6. Pushing up on your hands (I.e. from bathtub or chair) 0   7. Preparing food (I.e. peeling/cutting) 1   8. Driving  3   9. Vacuuming, sweeping, or raking 4   10. Dressing  3   11. Doing up buttons 2   12. Using tools/appliances 1   13. Opening doors 0   14. Cleaning  3   15. Tying or lacing shoes 2   16. Sleeping  3   17. Laundering clothes (I.e. washing, ironing, folding) 3   18. Opening a jar 3   19. Throwing a ball 1   20. Carrying a small suitcase with your affected limb.  1   Score total:  35/80 58/80     05/08/24- UEFS 15/80 via online version, later noted questions were different than one in epic   COGNITION: Overall cognitive status: Within functional limits for tasks  assessed     SENSATION: WFL  POSTURE: rounded shoulders and forward head   UPPER EXTREMITY ROM: AROM RUE WFL  Passive ROM Left eval L 04/06/24 L 04/13/24 L 04/20/24 L 04/24/24 L 04/27/24 L 9/17 L  9/19 L 9/26 L 06/01/24 L 06/05/24 L 06/07/24 L 06/19/24  Shoulder flexion 100 PROM 110 AAROM 110* PROM, 120* AAROM supine  120-130* PROM (bit guarded today) 130* PROM and AROM  114 AROM 140 AAROM 145 AAROM 138 AAROM Improves to 149 AAROM 139 AAROM 148 AAROM improves to 153 AAROM 144* AAROM  130 degrees on shoulder flexion ladder  120 before manual, 135 after in supine, 140 on shoulder flexion ladder  135 deg before manual 140  130 AAROM against gravity   Shoulder extension               Shoulder abduction               Shoulder adduction               Shoulder internal rotation         Mid glute. L5 at best with IR stretch     L5  Shoulder external rotation 20 25* next to body PROM setaed (very guarded), 30* AAROM seated  30* PROM arm next to body  30* PROM arm next to body      30* PROM/AAROM supine arm next to body but limited by anterior shoulder pain     60 deg   Elbow flexion               Elbow extension               Wrist flexion               Wrist extension               Wrist ulnar deviation               Wrist radial deviation               Wrist pronation               Wrist supination               (Blank rows = not tested) *=pain/symptoms  UPPER EXTREMITY MMT: NT due to post op status  MMT Right eval Left eval Left 05/11/24 Left 06/19/24  Shoulder flexion   3 UT compensations  3  Shoulder extension      Shoulder abduction   3 UT compensations  3  Shoulder adduction      Shoulder internal rotation    4-  Shoulder external rotation    4-  Middle trapezius      Lower trapezius      Elbow flexion      Elbow extension      Wrist flexion      Wrist extension      Wrist ulnar deviation      Wrist radial deviation      Wrist pronation      Wrist supination      Grip  strength (lbs)      (Blank rows = not tested) *=pain/symptoms    PALPATION:  LUE gross edema  TODAY'S TREATMENT:                                                                                                                                         DATE:  07/05/24 -UBE L1: 30 sec forward/30 sec backward x 2, towel in small of back for improved posture - finger ladder into Lt shoulder flexion  - manual therapy:  STM to Lt pec major, biceps brachii, ant deltoid - Stargazer stretch 30 secx 2 supine (back of Lt palm on forehead) - staggered stance scap retraction with blue band -> green band row -> red band shoulder extension to neutral -Prone scap retraction + shoulder flexion against gravity 0# x 15 -prone scap retraction partial row, 3 sec hold x 10  07/03/24 Pulleys for w/u and ROM x6 minutes   Prone shoulder extensions 2# x12 cues for scapular retraction/controlled motion  Prone shoulder Ts 0# 2x12 cues for scapular retraction/depression and not rolling body to compensate for lack of ROM Prone rows 2# 2x12 cues for quality scap retraction Prone scap retraction + shoulder flexion against gravity 0# 2x12 Stargazer stretch 120 seconds supine  Towel stretch for IR standing 5x10 seconds L Scap retractions blue TB focus on quality scap retraction and controlled movement x15   Education on progressions towards more strength training as appropriate per protocol, also encouraged him to pay attention to if any activities cause shoulder pain and to take rest/reduce intensity as appropriate.    06/25/24 -UBE L1: 30 sec forward/30 sec backward x 2, towel in small of back for improved posture -Pulleys into flexion x 15, into abdct x 15, towel in small of back for improved posture -Seated - Lt arm slides up incline of table with pillow case x 10 (unable to lift arm off of table), cues for avoiding scap elevation - standing Lt shoulder flexion to reach and lift cone to shoulder height shelf,  without and with assist from RUE - tactile cues to avoid scapular elevation - standing shoulder ext AAROM with cane  x10 -Prone row with 1# with tactile cues for scap retraction/depress -Prone Lt shoulder ext to neutral with 1# - with tactile cues for scap retraction/depress -Prone Lt shoulder T's (no Wt)- with tactile cues for scap retraction/depress - Manual therapy: STM to Lt pec major, biceps brachii - modified star gazer pose (back of palm to forehead with elbows out to side)   06/22/24 UBE L1, 1 min forward, 1 min backward, 30 sec each forward/backward Pulleys into flexion x 15, into abdct x 15 Manual therapy: STM to Lt pec major, biceps brachii, upper/mid trap Supine Lt shoulder flexion with scap set x 8, within painfree range Supine Lt pec stretch with arm at 90-100 Prone row with 2# with tactile cues for scap retraction/depress Prone Lt shoulder ext to neutral Prone Lt shoulder T's (no Wt)  06/19/24 Pulleys x 5 min  Reassessment  Standing shoulder flexion in mirror x10 Standing shoulder flexion ladder x10 Standing wall slide with towel and RTB around hands 2x10  06/07/24 Shoulder flexion pulleys x5 min  Posterior/inferior GHJ glides grade II-III L shoulder PROM into flexion/ER  STM to anterior deltoid, biceps, lateral deltoid, and UT Shoulder flexion with dowel x10 SL shoulder abduction with bent elbow 2x10 SL shoulder ER 2x10 Shoulder flexion, abduction, and ER in mirror x5 each  Shoulder flexion circles on ball on wall 2x10  06/05/24 STM and trigger point release to L upper trap, deltoid, and bicep  Grade II-III posterior and inferior mobilizations at L shoulder  Shoulder flexion in supine with dowel 2x10  Shoulder ABCs x2 SL shoulder abduction 2x10  Shoulder IR with dowel behind back 2x10 Shoulder flexion ladder 2x10  06/01/24 Pulleys x5 min STM and trigger point release to L upper trap, deltoid, and bicep  Grade II-III posterior and inferior mobilizations  at L shoulder  PROM to L shoulder Shoulder flexion, abduction, ER/IR, extension isometric contractions x10 each with 3 second holds  Shoulder flexion in supine with dowel x10 Shoulder flexion ladder x10   05/29/24 STM and trigger point release to L upper trap, deltoid, and bicep  Grade II-III posterior and inferior mobilizations at L shoulder  PROM to L shoulder AAROM shoulder flexion 3x10 Supine pec stretch 3x10 seconds   05/25/24 -Pulleys into Rt shoulder flexion - ROM with cane in front of mirror:  bil shoulder ext, IR sliding gently behind back, bil shoulder flexion, Lt scaption - row with scap squeeze 3 sec hold x 12, green band  - bow and arrow (midlevel row) with same side step back, green band x 10 each side - shoulder extension to neutral with scap squeeze with red band x 10 in semi-staggered stance - applied reg KTtape in zigzag pattern over Lt shoulder incision. Pt given verbal instructions on safe removal technique; verbalized understanding    PATIENT EDUCATION:  Education details:  HEP, relevant anatomy/biomechanics, protocol/precautions. Person educated: Patient Education method: Explanation, Demonstration,  Education comprehension: verbalized understanding, returned demonstration, verbal cues required, and tactile cues required  HOME EXERCISE PROGRAM:  Access Code: [email or web address] URL: [email or web address] Date: 04/13/2024 Prepared by: Josette Rough  Exercises - Wrist AROM Flexion Extension  - 3 x daily - 7 x weekly - 2 sets - 10 reps - Seated Elbow Flexion and Extension AROM (Mirrored)  - 3 x daily - 7 x weekly - 2 sets - 10 reps - Circular Shoulder Pendulum with Table Support (Mirrored)  - 3 x daily - 7 x weekly - 2 sets - 10 reps - Seated Scapular Retraction  - 3 x daily - 7 x weekly - 2 sets - 10 reps - Seated Shoulder Flexion Towel Slide at Table Top (Mirrored)  - 3 x daily - 7 x weekly - 10 reps - 10 second hold - Supine Shoulder Flexion AAROM  with Hands Clasped  - 3 x daily - 7 x weekly - 3 sets - 10 reps - Supine Shoulder External Rotation with Dowel  - 3 x daily - 7 x weekly - 2 sets - 10 reps - Standing Isometric Shoulder Flexion with Doorway - Arm Bent  - 1 x daily - 7 x weekly - 2 sets - 10 reps - 3 seconds  hold - Standing Isometric Shoulder Abduction with Doorway - Arm Bent  - 1 x daily - 7 x weekly - 2 sets - 10 reps - 3 seconds  hold - Standing  Isometric Shoulder Extension with Doorway - Arm Bent  - 1 x daily - 7 x weekly - 2 sets - 10 reps - 3 seconds  hold - Standing Isometric Shoulder External Rotation with Doorway  - 1 x daily - 7 x weekly - 2 sets - 10 reps - 3 seconds  hold   ASSESSMENT:  CLINICAL IMPRESSION:  Continued working on functional strengthening today as appropriate, with focus on postural/ scapular strengthening and stability. Pt reports increased pain in Lt ant prox biceps brachii area with flexion around 15-20.  Continued encouragement given to pay attention to if any activities that cause shoulder pain and to take rest/reduce intensity as appropriate.    EVAL: Patient a 83 y.o. y.o. male who was seen today for physical therapy evaluation and treatment for L reverse TSA DOS 03/27/24. Patient presents with pain limited deficits in L shoulder strength, ROM, endurance, activity tolerance, and functional mobility with ADL. Patient is having to modify and restrict ADL as indicated by outcome measure score as well as subjective information and objective measures which is affecting overall participation. Patient will benefit from skilled physical therapy in order to improve function and reduce impairment.  OBJECTIVE IMPAIRMENTS: decreased activity tolerance, decreased endurance, decreased mobility, decreased ROM, decreased strength, increased muscle spasms, impaired flexibility, impaired UE functional use, improper body mechanics, and pain  ACTIVITY LIMITATIONS:  carrying, lifting, bending, sleeping, bed mobility,  bathing, dressing, reach over head, hygiene/grooming, and caring for others  PARTICIPATION LIMITATIONS:  meal prep, cleaning, laundry, driving, shopping, community activity, and yard work  PERSONAL FACTORS: Age, Time since onset of injury/illness/exacerbation, and 1-2 comorbidities: DM, HLD are also affecting patient's functional outcome.   REHAB POTENTIAL: Good  CLINICAL DECISION MAKING: Evolving/moderate complexity  EVALUATION COMPLEXITY: Moderate  GOALS: Goals reviewed with patient? Yes  SHORT TERM GOALS: Target date: 05/11/2024    Patient will be independent with HEP in order to improve functional outcomes. Baseline: Goal status: MET 06/19/24  2.  Patient will report at least 25% improvement in symptoms for improved quality of life. Baseline:  Goal status: MET 06/19/24  3.  Patient will demonstrate Elevation to 130 deg and ER to 30 deg - passive, active assisted or active  Baseline:  Goal status: MET 04/20/24  4.  Patient will be out of sling per surgeon approval for improved functional use of L shoulder. Baseline:  Goal status: MET 04/13/24   LONG TERM GOALS: Target date: 09/11/2024   Patient will report at least 75% improvement in symptoms for improved quality of life. Baseline:  Goal status: IN PROGRESS (65% 06/19/24)  2.  Patient will improve UEFS score by at least 25 points in order to indicate improved tolerance to activity. Baseline:  Goal status: IN PROGRESS 06/19/24  3.  Patient will demonstrate at least PROM/AAROM/AROM to: Elevation - 145-160; ER - 40-50 ; functional IR to L1 in L shoulder for improved ability lift overhead. Baseline:  Goal status: IN PROGRESS 06/19/24  4.  Patient will be able to return to all activities unrestricted for improved ability to perform yard work functions and participate with family.  Baseline:  Goal status: IN PROGRESS 06/19/24  5.  Patient will demonstrate grade of 5/5 MMT grade in all tested musculature as evidence of improved  strength to assist with lifting at home. Baseline:  Goal status: IN PROGRESS 06/19/24   PLAN:  PT FREQUENCY: 1-2x/week  PT DURATION: 12 weeks  PLANNED INTERVENTIONS:97164- PT Re-evaluation, 97110-Therapeutic exercises, 97530- Therapeutic activity, 97112- Neuromuscular re-education,  02464- Self Care, 02859- Manual therapy, [id number]- Gait training, [phone]- Orthotic Fit/training, [phone]- Canalith repositioning, [id number]- Aquatic Therapy, [id number]- Splinting, [phone]- Wound care (first 20 sq cm), 97598- Wound care (each additional 20 sq cm)Patient/Family education, Balance training, Stair training, Taping, Dry Needling, Joint mobilization, Joint manipulation, Spinal manipulation, Spinal mobilization, Scar mobilization, and DME instructions.  PLAN FOR NEXT SESSION: progress per protocol, progress PREs as appropriate/tolerated - WB for proximation.  Delon Aquas, PTA 07/05/24 10:48 AM [facility] [address] [place] [address], [place], [address] Phone: [phone]   Fax:  [phone]

## 2024-07-09 ENCOUNTER — Ambulatory Visit: Admitting: Family Medicine

## 2024-07-09 ENCOUNTER — Other Ambulatory Visit: Payer: Self-pay

## 2024-07-09 VITALS — BP 136/74 | HR 73 | Temp 98.6°F | Ht 67.0 in | Wt 149.0 lb

## 2024-07-09 DIAGNOSIS — E119 Type 2 diabetes mellitus without complications: Secondary | ICD-10-CM | POA: Diagnosis not present

## 2024-07-09 DIAGNOSIS — Z1379 Encounter for other screening for genetic and chromosomal anomalies: Secondary | ICD-10-CM

## 2024-07-09 DIAGNOSIS — Z7984 Long term (current) use of oral hypoglycemic drugs: Secondary | ICD-10-CM | POA: Diagnosis not present

## 2024-07-09 NOTE — Addendum Note (Signed)
 Addended by: ISADORA RAISIN on: 07/09/2024 01:40 PM   Modules accepted: Orders

## 2024-07-09 NOTE — Progress Notes (Unsigned)
 There was an error with checking him in today and he had to wait a long time in the waiting room.  I told him I was sorry about this and he was gracious about it.  L shoulder is getting better.  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   He had some help with that.    DM2.  Glimepiride  2mg , lantus  7 units, metformin  IR 1000mg  AM and ER 1021m PM.  Januvia  100mg  a day.    Sugar usually 100-130 with current meds.    D/w pt about options to taper total number of meds.  He would like to get on fewer pills per day.  Meds, vitals, and allergies reviewed.   ROS: Per HPI unless specifically indicated in ROS section   Nad Ncat Neck supple, no LA Rrr Ctab Abd soft. Not ttp Skin well-perfused.

## 2024-07-09 NOTE — Patient Instructions (Addendum)
 Go to the lab on the way out.   If you have mychart we'll likely use that to update you.     I wouldn't make more than 1 change per week.    I would stop glimepiride .   If your AM sugar is above 150, then I would add 1 unit per day until your AM sugar is between 100 and 150.   Then I would cut AM metformin  back to 1 tab a day.   If your AM sugar is above 150, then I would add 1 unit per day until your AM sugar is between 100 and 150.   Then I would stop AM metformin . You could still increase your insulin  again if needed.   I would only change 1 unit per day.  Add 1 if above 150, decrease by 1 if below 100.  No change if 100-150.    Please let me know how it goes.

## 2024-07-10 ENCOUNTER — Encounter (HOSPITAL_BASED_OUTPATIENT_CLINIC_OR_DEPARTMENT_OTHER): Payer: Self-pay | Admitting: Physical Therapy

## 2024-07-10 ENCOUNTER — Ambulatory Visit (HOSPITAL_BASED_OUTPATIENT_CLINIC_OR_DEPARTMENT_OTHER): Admitting: Physical Therapy

## 2024-07-10 DIAGNOSIS — M25512 Pain in left shoulder: Secondary | ICD-10-CM | POA: Diagnosis not present

## 2024-07-10 DIAGNOSIS — M25612 Stiffness of left shoulder, not elsewhere classified: Secondary | ICD-10-CM | POA: Diagnosis not present

## 2024-07-10 DIAGNOSIS — R29898 Other symptoms and signs involving the musculoskeletal system: Secondary | ICD-10-CM

## 2024-07-10 DIAGNOSIS — M6281 Muscle weakness (generalized): Secondary | ICD-10-CM | POA: Diagnosis not present

## 2024-07-10 NOTE — Therapy (Signed)
 OUTPATIENT PHYSICAL THERAPY SHOULDER TREATMENT NOTE   Patient Name: Greg Rogers. MRN: [id number] DOB:1941-01-17, 83 y.o., male Today's Date: 07/10/2024  END OF SESSION:  PT End of Session - 07/10/24 1237     Visit Number 23    Number of Visits 48    Date for Recertification  09/11/24    Authorization Type MCR    Progress Note Due on Visit 28    PT Start Time 1231    PT Stop Time 1309    PT Time Calculation (min) 38 min    Activity Tolerance Patient tolerated treatment well    Behavior During Therapy WFL for tasks assessed/performed          Past Medical History:  Diagnosis Date   Abnormal GGT test    With otherwise normal work up.   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)    Persistent disorder of initiating or maintaining sleep    Pes planus    Rosacea    Snoring    Past Surgical History:  Procedure Laterality Date   APPENDECTOMY  1949   at age 76   CERVICAL FUSION  03/2016   C 3&4   Cyst removed from breast  1960   FOOT SURGERY  01/2009   Left   HERNIA REPAIR  1956   at age 43 (single)   HERNIA REPAIR  1960   (double)   Left 2nd toe removed  03/2017   REVERSE SHOULDER ARTHROPLASTY Left 03/27/2024   Procedure: ARTHROPLASTY, SHOULDER, TOTAL, REVERSE;  Surgeon: Genelle Standing, MD;  Location: [facility];  Service: Orthopedics;  Laterality: Left;   SPINE SURGERY     TONSILLECTOMY AND ADENOIDECTOMY  ~ 1950   VASECTOMY  ~ 1984   Patient Active Problem List   Diagnosis Date Noted   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 03/27/2024   Leg pain 03/21/2024   Shoulder pain 12/07/2023   Change in stool 11/02/2023   Nasal lesion 06/02/2023   Occipital neuralgia 10/12/2020   Wound of right leg, initial encounter  07/03/2019   Healthcare maintenance 03/04/2017   Foot pain 07/22/2016   Cervical stenosis of spine 01/06/2016   thyroid  nodule-benign path on bx 01/2016 01/06/2016   Radicular pain in right arm 12/22/2015   Radicular leg pain 12/22/2015   Back pain 07/06/2015   Constipation 07/06/2015   FH: CAD (coronary artery disease) 05/23/2015   Family history of ischemic heart disease and other diseases of the circulatory system 05/23/2015   Advance care planning 11/26/2014   Pain in joint, shoulder region 11/26/2014   Medicare annual wellness visit, subsequent 09/27/2012   GERD (gastroesophageal reflux disease) 07/05/2012   Irritability 03/29/2011   Advance directive on file 01/09/2011   Pain in joint, other site 01/04/2011   Arthropathy 09/02/2010   NEPHROLITHIASIS, HX OF 09/02/2010   Diabetes mellitus without complication (HCC) 09/01/2010   HLD (hyperlipidemia) 10/24/2009   SNORING 10/24/2009   FOOT PAIN, BILATERAL 10/06/2009   Pes planus 10/06/2009   Acquired flat foot 10/06/2009    PCP: Cleatus Arlyss RAMAN, MD  REFERRING PROVIDER: Genelle Standing, MD  REFERRING DIAG: S46.012A (ICD-10-CM) - 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,  initial encounter;  1. Left shoulder reverse shoulder arthroplasty 2. Left shoulder biceps tenodesis  THERAPY DIAG:  Acute pain of left shoulder  Muscle weakness (generalized)  Stiffness of left shoulder, not elsewhere classified  Other symptoms and signs involving the musculoskeletal system  Rationale for Evaluation and Treatment: Rehabilitation  ONSET DATE: DOS 03/27/24  SUBJECTIVE:                                                                                                                                                                                      SUBJECTIVE STATEMENT: Pt reports that he rested yesterday due to weather; shoulder feels stiff but good.   EVAL: Patient states shoulder has been doing good. Using sling. Was very  active prior to injury/surgery with gardening/yard work Hand dominance: Right  PERTINENT HISTORY: DM, HLD, chronic shoulder pain with fall in March while exiting ski lift with RC tear  PAIN:  Are you having pain? Yes 1/10 Location: anterior Lt shoulder/upper arm Description: dull, uncomfortable Worsened by: raising arm to front Relieved by:  massage, icy hot      PRECAUTIONS: Other: L reverse TSA   RED FLAGS: None   WEIGHT BEARING RESTRICTIONS: NWB LUE at eval  FALLS:  Has patient fallen in last 6 months? Yes. Number of falls 1  PLOF: Independent  PATIENT GOALS:regain use of the arm  OBJECTIVE: (objective measures from initial evaluation unless otherwise dated)  OBSERVATION: using sling, waterproof dressing intact  PATIENT SURVEYS:  UEFS  Extreme difficulty/unable (0), Quite a bit of difficulty (1), Moderate difficulty (2), Little difficulty (3), No difficulty (4) Survey date:  03/30/24 06/19/24  Any of your usual work, household or school activities 0   2. Your usual hobbies, recreational/sport activities 1    3. Lifting a bag of groceries to waist level 2    4. Lifting a bag of groceries above your head 0   5. Grooming your hair 4   6. Pushing up on your hands (I.e. from bathtub or chair) 0   7. Preparing food (I.e. peeling/cutting) 1   8. Driving  3   9. Vacuuming, sweeping, or raking 4   10. Dressing  3   11. Doing up buttons 2   12. Using tools/appliances 1   13. Opening doors 0   14. Cleaning  3   15. Tying or lacing shoes 2   16. Sleeping  3   17. Laundering clothes (I.e. washing, ironing, folding) 3   18. Opening a jar 3   19. Throwing a ball 1   20. Carrying a small suitcase with your affected limb.  1   Score total:  35/80  58/80     05/08/24- UEFS 15/80 via online version, later noted questions were different than one in epic   COGNITION: Overall cognitive status: Within functional limits for tasks assessed     SENSATION: WFL  POSTURE:  rounded shoulders and forward head   UPPER EXTREMITY ROM: AROM RUE WFL  Passive ROM Left eval L 04/06/24 L 04/13/24 L 04/20/24 L 04/24/24 L 04/27/24 L 9/17 L  9/19 L 9/26 L 06/01/24 L 06/05/24 L 06/07/24 L 06/19/24  Shoulder flexion 100 PROM 110 AAROM 110* PROM, 120* AAROM supine  120-130* PROM (bit guarded today) 130* PROM and AROM  114 AROM 140 AAROM 145 AAROM 138 AAROM Improves to 149 AAROM 139 AAROM 148 AAROM improves to 153 AAROM 144* AAROM  130 degrees on shoulder flexion ladder  120 before manual, 135 after in supine, 140 on shoulder flexion ladder  135 deg before manual 140  130 AAROM against gravity   Shoulder extension               Shoulder abduction               Shoulder adduction               Shoulder internal rotation         Mid glute. L5 at best with IR stretch     L5  Shoulder external rotation 20 25* next to body PROM setaed (very guarded), 30* AAROM seated  30* PROM arm next to body  30* PROM arm next to body      30* PROM/AAROM supine arm next to body but limited by anterior shoulder pain     60 deg   Elbow flexion               Elbow extension               Wrist flexion               Wrist extension               Wrist ulnar deviation               Wrist radial deviation               Wrist pronation               Wrist supination               (Blank rows = not tested) *=pain/symptoms  UPPER EXTREMITY MMT: NT due to post op status  MMT Right eval Left eval Left 05/11/24 Left 06/19/24  Shoulder flexion   3 UT compensations  3  Shoulder extension      Shoulder abduction   3 UT compensations  3  Shoulder adduction      Shoulder internal rotation    4-  Shoulder external rotation    4-  Middle trapezius      Lower trapezius      Elbow flexion      Elbow extension      Wrist flexion      Wrist extension      Wrist ulnar deviation      Wrist radial deviation      Wrist pronation      Wrist supination      Grip strength (lbs)      (Blank rows = not  tested) *=pain/symptoms    PALPATION:  LUE gross edema  TODAY'S TREATMENT:                                                                                                                                         DATE:  07/10/24 -UBE L1: 30 sec forward/30 sec backward x , towel in small of back for improved posture - quadruped position (for shoulder approximation)- rocking R/L; alternating forward arm lifts; alternating side arm lifts  - counter push up position (for shoulder approximation) - alternating rows, alternating opposite shoulder taps - finger ladder into Lt shoulder flexion  - in front of mirror: bil shoulder flexion to 80deg and steering wheel motion -in front of mirror: bil bicep curl to overhead press with ball -> 2# wt x 10  - reverse mini wall push up x 10  - Stargazer stretch 60 sec in supine (back of Lt palm on forehead) - manual therapy:  STM to Lt pec major, biceps brachii, ant deltoid, latissimus - Lt shoulder flexion 60-110 in supine with 1#, cues for good scapular positioning   PATIENT EDUCATION:  Education details:  HEP, relevant anatomy/biomechanics, protocol/precautions. Person educated: Patient Education method: Explanation, Demonstration,  Education comprehension: verbalized understanding, returned demonstration, verbal cues required, and tactile cues required  HOME EXERCISE PROGRAM:  Access Code: [email or web address] URL: [email or web address] Date: 04/13/2024 Prepared by: Josette Rough  Exercises - Wrist AROM Flexion Extension  - 3 x daily - 7 x weekly - 2 sets - 10 reps - Seated Elbow Flexion and Extension AROM (Mirrored)  - 3 x daily - 7 x weekly - 2 sets - 10 reps - Circular Shoulder Pendulum with Table Support (Mirrored)  - 3 x daily - 7 x weekly - 2 sets - 10 reps - Seated Scapular Retraction  - 3 x daily - 7 x weekly - 2 sets - 10  reps - Seated Shoulder Flexion Towel Slide at Table Top (Mirrored)  - 3 x daily - 7 x weekly - 10 reps - 10 second hold - Supine Shoulder Flexion AAROM with Hands Clasped  - 3 x daily - 7 x weekly - 3 sets - 10 reps - Supine Shoulder External Rotation with Dowel  - 3 x daily - 7 x weekly - 2 sets - 10 reps - Standing Isometric Shoulder Flexion with Doorway - Arm Bent  - 1 x daily - 7 x weekly - 2 sets - 10 reps - 3 seconds  hold - Standing Isometric Shoulder Abduction with Doorway - Arm Bent  - 1 x daily - 7 x weekly - 2 sets - 10 reps - 3 seconds  hold - Standing Isometric Shoulder Extension with Doorway - Arm Bent  - 1 x daily - 7 x weekly - 2 sets - 10 reps - 3 seconds  hold - Standing Isometric Shoulder External Rotation with Doorway  - 1 x daily - 7 x weekly - 2 sets - 10  reps - 3 seconds  hold   ASSESSMENT:  CLINICAL IMPRESSION: Pt is 15 wks s/p Lt reverse TSA.  Continued working on functional strengthening today as appropriate, with focus on postural/ scapular strengthening and stability.  Pain remained relatively low during the session.  He continues to demonstrate difficulty with Lt shoulder flexion in standing from ~70-105 range. Much improved ability with assistance of RUE when lifting an item overhead. Pt is making good progress towards LTGs.    EVAL: Patient a 83 y.o. y.o. male who was seen today for physical therapy evaluation and treatment for L reverse TSA DOS 03/27/24. Patient presents with pain limited deficits in L shoulder strength, ROM, endurance, activity tolerance, and functional mobility with ADL. Patient is having to modify and restrict ADL as indicated by outcome measure score as well as subjective information and objective measures which is affecting overall participation. Patient will benefit from skilled physical therapy in order to improve function and reduce impairment.  OBJECTIVE IMPAIRMENTS: decreased activity tolerance, decreased endurance, decreased mobility,  decreased ROM, decreased strength, increased muscle spasms, impaired flexibility, impaired UE functional use, improper body mechanics, and pain  ACTIVITY LIMITATIONS:  carrying, lifting, bending, sleeping, bed mobility, bathing, dressing, reach over head, hygiene/grooming, and caring for others  PARTICIPATION LIMITATIONS:  meal prep, cleaning, laundry, driving, shopping, community activity, and yard work  PERSONAL FACTORS: Age, Time since onset of injury/illness/exacerbation, and 1-2 comorbidities: DM, HLD are also affecting patient's functional outcome.   REHAB POTENTIAL: Good  CLINICAL DECISION MAKING: Evolving/moderate complexity  EVALUATION COMPLEXITY: Moderate  GOALS: Goals reviewed with patient? Yes  SHORT TERM GOALS: Target date: 05/11/2024    Patient will be independent with HEP in order to improve functional outcomes. Baseline: Goal status: MET 06/19/24  2.  Patient will report at least 25% improvement in symptoms for improved quality of life. Baseline:  Goal status: MET 06/19/24  3.  Patient will demonstrate Elevation to 130 deg and ER to 30 deg - passive, active assisted or active  Baseline:  Goal status: MET 04/20/24  4.  Patient will be out of sling per surgeon approval for improved functional use of L shoulder. Baseline:  Goal status: MET 04/13/24   LONG TERM GOALS: Target date: 09/11/2024   Patient will report at least 75% improvement in symptoms for improved quality of life. Baseline:  Goal status: IN PROGRESS (65% 06/19/24)  2.  Patient will improve UEFS score by at least 25 points in order to indicate improved tolerance to activity. Baseline:  Goal status: IN PROGRESS 06/19/24  3.  Patient will demonstrate at least PROM/AAROM/AROM to: Elevation - 145-160; ER - 40-50 ; functional IR to L1 in L shoulder for improved ability lift overhead. Baseline:  Goal status: IN PROGRESS 06/19/24  4.  Patient will be able to return to all activities unrestricted for  improved ability to perform yard work functions and participate with family.  Baseline:  Goal status: IN PROGRESS 07/10/24  5.  Patient will demonstrate grade of 5/5 MMT grade in all tested musculature as evidence of improved strength to assist with lifting at home. Baseline:  Goal status: IN PROGRESS 06/19/24   PLAN:  PT FREQUENCY: 1-2x/week  PT DURATION: 12 weeks  PLANNED INTERVENTIONS:97164- PT Re-evaluation, 97110-Therapeutic exercises, 97530- Therapeutic activity, 97112- Neuromuscular re-education, 97535- Self Care, [id number]- Manual therapy, [id number]- Gait training, [id number]- Orthotic Fit/training, [phone]- Canalith repositioning, [id number]- Aquatic Therapy, 97760- Splinting, [id number]- Wound care (first 20 sq cm), 02401- Wound care (each additional 20 sq cm)Patient/Family education, Balance  training, Stair training, Taping, Dry Needling, Joint mobilization, Joint manipulation, Spinal manipulation, Spinal mobilization, Scar mobilization, and DME instructions.  PLAN FOR NEXT SESSION: progress per protocol, progress PREs as appropriate/tolerated - WB for proximation.  Delon Aquas, PTA 07/10/24 1:35 PM [facility] [address] [place], [place], [address] Phone: [phone]   Fax:  [phone]

## 2024-07-11 NOTE — Assessment & Plan Note (Signed)
 Discussed tapering his medications.  I wouldn't make more than 1 change per week.    I would stop glimepiride .   If AM sugar is above 150, then I would add 1 unit of insulin  per day until AM sugar is between 100 and 150.   Then I would cut AM metformin  back to 1 tab a day.   If AM sugar is above 150, then I would add 1 unit per day until AM sugar is between 100 and 150.   Then I would stop AM metformin .  He could still increase insulin  again if needed.   I would only change 1 unit per day.  Add 1 if above 150, decrease by 1 if below 100.  No change if 100-150.    He can let me know how that goes.

## 2024-07-16 ENCOUNTER — Encounter: Payer: Self-pay | Admitting: Family Medicine

## 2024-07-16 ENCOUNTER — Encounter (HOSPITAL_BASED_OUTPATIENT_CLINIC_OR_DEPARTMENT_OTHER): Payer: Self-pay | Admitting: Orthopaedic Surgery

## 2024-07-18 ENCOUNTER — Ambulatory Visit (HOSPITAL_BASED_OUTPATIENT_CLINIC_OR_DEPARTMENT_OTHER): Attending: Orthopaedic Surgery | Admitting: Physical Therapy

## 2024-07-18 ENCOUNTER — Encounter (HOSPITAL_BASED_OUTPATIENT_CLINIC_OR_DEPARTMENT_OTHER): Payer: Self-pay | Admitting: Physical Therapy

## 2024-07-18 DIAGNOSIS — M6281 Muscle weakness (generalized): Secondary | ICD-10-CM | POA: Diagnosis present

## 2024-07-18 DIAGNOSIS — M25512 Pain in left shoulder: Secondary | ICD-10-CM | POA: Insufficient documentation

## 2024-07-18 DIAGNOSIS — M25612 Stiffness of left shoulder, not elsewhere classified: Secondary | ICD-10-CM | POA: Insufficient documentation

## 2024-07-18 DIAGNOSIS — R29898 Other symptoms and signs involving the musculoskeletal system: Secondary | ICD-10-CM | POA: Insufficient documentation

## 2024-07-18 NOTE — Therapy (Addendum)
 OUTPATIENT PHYSICAL THERAPY SHOULDER TREATMENT NOTE   Patient Name: Greg Rogers. MRN: [id number] DOB:09-15-1940, 83 y.o., male Today's Date: 07/18/2024  END OF SESSION:  PT End of Session - 07/18/24 1058     Visit Number 24    Number of Visits 48    Date for Recertification  09/11/24    Authorization Type MCR    Progress Note Due on Visit 28    PT Start Time 1100    PT Stop Time 1145    PT Time Calculation (min) 45 min    Activity Tolerance Patient tolerated treatment well    Behavior During Therapy WFL for tasks assessed/performed          Past Medical History:  Diagnosis Date   Abnormal GGT test    With otherwise normal work up.   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)    Persistent disorder of initiating or maintaining sleep    Pes planus    Rosacea    Snoring    Past Surgical History:  Procedure Laterality Date   APPENDECTOMY  1949   at age 40   CERVICAL FUSION  03/2016   C 3&4   Cyst removed from breast  1960   FOOT SURGERY  01/2009   Left   HERNIA REPAIR  1956   at age 36 (single)   HERNIA REPAIR  1960   (double)   Left 2nd toe removed  03/2017   REVERSE SHOULDER ARTHROPLASTY Left 03/27/2024   Procedure: ARTHROPLASTY, SHOULDER, TOTAL, REVERSE;  Surgeon: Genelle Standing, MD;  Location: [facility];  Service: Orthopedics;  Laterality: Left;   SPINE SURGERY     TONSILLECTOMY AND ADENOIDECTOMY  ~ 1950   VASECTOMY  ~ 1984   Patient Active Problem List   Diagnosis Date Noted   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 03/27/2024   Leg pain 03/21/2024   Shoulder pain 12/07/2023   Change in stool 11/02/2023   Nasal lesion 06/02/2023   Occipital neuralgia 10/12/2020   Wound of right leg, initial encounter  07/03/2019   Healthcare maintenance 03/04/2017   Foot pain 07/22/2016   Cervical stenosis of spine 01/06/2016   thyroid  nodule-benign path on bx 01/2016 01/06/2016   Radicular pain in right arm 12/22/2015   Radicular leg pain 12/22/2015   Back pain 07/06/2015   Constipation 07/06/2015   FH: CAD (coronary artery disease) 05/23/2015   Family history of ischemic heart disease and other diseases of the circulatory system 05/23/2015   Advance care planning 11/26/2014   Pain in joint, shoulder region 11/26/2014   Medicare annual wellness visit, subsequent 09/27/2012   GERD (gastroesophageal reflux disease) 07/05/2012   Irritability 03/29/2011   Advance directive on file 01/09/2011   Pain in joint, other site 01/04/2011   Arthropathy 09/02/2010   NEPHROLITHIASIS, HX OF 09/02/2010   Diabetes mellitus without complication (HCC) 09/01/2010   HLD (hyperlipidemia) 10/24/2009   SNORING 10/24/2009   FOOT PAIN, BILATERAL 10/06/2009   Pes planus 10/06/2009   Acquired flat foot 10/06/2009    PCP: Cleatus Arlyss RAMAN, MD  REFERRING PROVIDER: Genelle Standing, MD  REFERRING DIAG: S46.012A (ICD-10-CM) - 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,  initial encounter;  1. Left shoulder reverse shoulder arthroplasty 2. Left shoulder biceps tenodesis  THERAPY DIAG:  Acute pain of left shoulder  Stiffness of left shoulder, not elsewhere classified  Muscle weakness (generalized)  Other symptoms and signs involving the musculoskeletal system  Rationale for Evaluation and Treatment: Rehabilitation  ONSET DATE: DOS 03/27/24  SUBJECTIVE:                                                                                                                                                                                      SUBJECTIVE STATEMENT: Patient reports he is doing really well and he is really pleased with where he is at right now. Continues to have some difficulty with strength.   EVAL: Patient  states shoulder has been doing good. Using sling. Was very active prior to injury/surgery with gardening/yard work Hand dominance: Right  PERTINENT HISTORY: DM, HLD, chronic shoulder pain with fall in March while exiting ski lift with RC tear  PAIN:  Are you having pain? Yes 1/10 Location: anterior Lt shoulder/upper arm Description: dull, uncomfortable Worsened by: raising arm to front Relieved by:  massage, icy hot      PRECAUTIONS: Other: L reverse TSA   RED FLAGS: None   WEIGHT BEARING RESTRICTIONS: NWB LUE at eval  FALLS:  Has patient fallen in last 6 months? Yes. Number of falls 1  PLOF: Independent  PATIENT GOALS:regain use of the arm  OBJECTIVE: (objective measures from initial evaluation unless otherwise dated)  OBSERVATION: using sling, waterproof dressing intact  PATIENT SURVEYS:  UEFS  Extreme difficulty/unable (0), Quite a bit of difficulty (1), Moderate difficulty (2), Little difficulty (3), No difficulty (4) Survey date:  03/30/24 06/19/24  Any of your usual work, household or school activities 0   2. Your usual hobbies, recreational/sport activities 1    3. Lifting a bag of groceries to waist level 2    4. Lifting a bag of groceries above your head 0   5. Grooming your hair 4   6. Pushing up on your hands (I.e. from bathtub or chair) 0   7. Preparing food (I.e. peeling/cutting) 1   8. Driving  3   9. Vacuuming, sweeping, or raking 4   10. Dressing  3   11. Doing up buttons 2   12. Using tools/appliances 1   13. Opening doors 0   14. Cleaning  3   15. Tying or lacing shoes 2   16. Sleeping  3   17. Laundering clothes (I.e. washing, ironing, folding) 3   18. Opening a jar 3   19. Throwing a ball 1   20. Carrying a scientist, water quality  with your affected limb.  1   Score total:  35/80 58/80     05/08/24- UEFS 15/80 via online version, later noted questions were different than one in epic   COGNITION: Overall cognitive status: Within functional limits  for tasks assessed     SENSATION: WFL  POSTURE: rounded shoulders and forward head   UPPER EXTREMITY ROM: AROM RUE WFL  Passive ROM Left eval L 04/06/24 L 04/13/24 L 04/20/24 L 04/24/24 L 04/27/24 L 9/17 L  9/19 L 9/26 L 06/01/24 L 06/05/24 L 06/07/24 L 06/19/24  Shoulder flexion 100 PROM 110 AAROM 110* PROM, 120* AAROM supine  120-130* PROM (bit guarded today) 130* PROM and AROM  114 AROM 140 AAROM 145 AAROM 138 AAROM Improves to 149 AAROM 139 AAROM 148 AAROM improves to 153 AAROM 144* AAROM  130 degrees on shoulder flexion ladder  120 before manual, 135 after in supine, 140 on shoulder flexion ladder  135 deg before manual 140  130 AAROM against gravity   Shoulder extension               Shoulder abduction               Shoulder adduction               Shoulder internal rotation         Mid glute. L5 at best with IR stretch     L5  Shoulder external rotation 20 25* next to body PROM setaed (very guarded), 30* AAROM seated  30* PROM arm next to body  30* PROM arm next to body      30* PROM/AAROM supine arm next to body but limited by anterior shoulder pain     60 deg   Elbow flexion               Elbow extension               Wrist flexion               Wrist extension               Wrist ulnar deviation               Wrist radial deviation               Wrist pronation               Wrist supination               (Blank rows = not tested) *=pain/symptoms  UPPER EXTREMITY MMT: NT due to post op status  MMT Right eval Left eval Left 05/11/24 Left 06/19/24  Shoulder flexion   3 UT compensations  3  Shoulder extension      Shoulder abduction   3 UT compensations  3  Shoulder adduction      Shoulder internal rotation    4-  Shoulder external rotation    4-  Middle trapezius      Lower trapezius      Elbow flexion      Elbow extension      Wrist flexion      Wrist extension      Wrist ulnar deviation      Wrist radial deviation      Wrist pronation      Wrist supination       Grip strength (lbs)      (Blank rows = not tested) *=pain/symptoms    PALPATION:  LUE gross edema                                                                                                       TODAY'S TREATMENT:                                                                                                                                         DATE:  07/18/24 STM to deltoid, biceps  Grade III posterior and inferior GHJ glides  Shoulder PROM D2 shoulder flexion in supine 3x10 Stargazer stretch in supine x10 Shoulder flexion with perpendicular resistance RTB 2x10    07/10/24 -UBE L1: 30 sec forward/30 sec backward x , towel in small of back for improved posture - quadruped position (for shoulder approximation)- rocking R/L; alternating forward arm lifts; alternating side arm lifts  - counter push up position (for shoulder approximation) - alternating rows, alternating opposite shoulder taps - finger ladder into Lt shoulder flexion  - in front of mirror: bil shoulder flexion to 80deg and steering wheel motion -in front of mirror: bil bicep curl to overhead press with ball -> 2# wt x 10  - reverse mini wall push up x 10  - Stargazer stretch 60 sec in supine (back of Lt palm on forehead) - manual therapy:  STM to Lt pec major, biceps brachii, ant deltoid, latissimus - Lt shoulder flexion 60-110 in supine with 1#, cues for good scapular positioning   PATIENT EDUCATION:  Education details:  HEP, relevant anatomy/biomechanics, protocol/precautions. Person educated: Patient Education method: Explanation, Demonstration,  Education comprehension: verbalized understanding, returned demonstration, verbal cues required, and tactile cues required  HOME EXERCISE PROGRAM:  Access Code: [email or web address] URL: [email or web address] Date: 04/13/2024 Prepared by: Josette Rough  Exercises - Wrist AROM Flexion Extension  - 3 x daily - 7 x weekly - 2 sets - 10 reps - Seated  Elbow Flexion and Extension AROM (Mirrored)  - 3 x daily - 7 x weekly - 2 sets - 10 reps - Circular Shoulder Pendulum with Table Support (Mirrored)  - 3 x daily - 7 x weekly - 2 sets - 10 reps - Seated Scapular Retraction  - 3 x daily - 7 x weekly - 2 sets - 10 reps - Seated Shoulder Flexion Towel Slide at Table Top (Mirrored)  - 3 x daily - 7 x weekly - 10 reps - 10 second hold - Supine Shoulder Flexion AAROM with Hands Clasped  - 3 x daily - 7  x weekly - 3 sets - 10 reps - Supine Shoulder External Rotation with Dowel  - 3 x daily - 7 x weekly - 2 sets - 10 reps - Standing Isometric Shoulder Flexion with Doorway - Arm Bent  - 1 x daily - 7 x weekly - 2 sets - 10 reps - 3 seconds  hold - Standing Isometric Shoulder Abduction with Doorway - Arm Bent  - 1 x daily - 7 x weekly - 2 sets - 10 reps - 3 seconds  hold - Standing Isometric Shoulder Extension with Doorway - Arm Bent  - 1 x daily - 7 x weekly - 2 sets - 10 reps - 3 seconds  hold - Standing Isometric Shoulder External Rotation with Doorway  - 1 x daily - 7 x weekly - 2 sets - 10 reps - 3 seconds  hold   ASSESSMENT:  CLINICAL IMPRESSION: Pt is 16 wks s/p Lt reverse TSA.  Continued working on functional strengthening today as appropriate, with focus on functional movement patterns and deltoid strengthening. Utilized manual therapy to address tightness and trigger points in anterior deltoid and biceps. Went from 140 degrees of shoulder flexion to 145 in supine after manual therapy. Verbal cueing for body mechanics and movement control. Patient is doing extremely well and is continuing to progress. Will continue to benefit from therapy to address remaining limitations.    EVAL: Patient a 83 y.o. y.o. male who was seen today for physical therapy evaluation and treatment for L reverse TSA DOS 03/27/24. Patient presents with pain limited deficits in L shoulder strength, ROM, endurance, activity tolerance, and functional mobility with ADL. Patient is  having to modify and restrict ADL as indicated by outcome measure score as well as subjective information and objective measures which is affecting overall participation. Patient will benefit from skilled physical therapy in order to improve function and reduce impairment.  OBJECTIVE IMPAIRMENTS: decreased activity tolerance, decreased endurance, decreased mobility, decreased ROM, decreased strength, increased muscle spasms, impaired flexibility, impaired UE functional use, improper body mechanics, and pain  ACTIVITY LIMITATIONS:  carrying, lifting, bending, sleeping, bed mobility, bathing, dressing, reach over head, hygiene/grooming, and caring for others  PARTICIPATION LIMITATIONS:  meal prep, cleaning, laundry, driving, shopping, community activity, and yard work  PERSONAL FACTORS: Age, Time since onset of injury/illness/exacerbation, and 1-2 comorbidities: DM, HLD are also affecting patient's functional outcome.   REHAB POTENTIAL: Good  CLINICAL DECISION MAKING: Evolving/moderate complexity  EVALUATION COMPLEXITY: Moderate  GOALS: Goals reviewed with patient? Yes  SHORT TERM GOALS: Target date: 05/11/2024    Patient will be independent with HEP in order to improve functional outcomes. Baseline: Goal status: MET 06/19/24  2.  Patient will report at least 25% improvement in symptoms for improved quality of life. Baseline:  Goal status: MET 06/19/24  3.  Patient will demonstrate Elevation to 130 deg and ER to 30 deg - passive, active assisted or active  Baseline:  Goal status: MET 04/20/24  4.  Patient will be out of sling per surgeon approval for improved functional use of L shoulder. Baseline:  Goal status: MET 04/13/24   LONG TERM GOALS: Target date: 09/11/2024   Patient will report at least 75% improvement in symptoms for improved quality of life. Baseline:  Goal status: IN PROGRESS (65% 06/19/24)  2.  Patient will improve UEFS score by at least 25 points in order to  indicate improved tolerance to activity. Baseline:  Goal status: IN PROGRESS 06/19/24  3.  Patient will demonstrate at  least PROM/AAROM/AROM to: Elevation - 145-160; ER - 40-50 ; functional IR to L1 in L shoulder for improved ability lift overhead. Baseline:  Goal status: IN PROGRESS 06/19/24  4.  Patient will be able to return to all activities unrestricted for improved ability to perform yard work functions and participate with family.  Baseline:  Goal status: IN PROGRESS 07/10/24  5.  Patient will demonstrate grade of 5/5 MMT grade in all tested musculature as evidence of improved strength to assist with lifting at home. Baseline:  Goal status: IN PROGRESS 06/19/24   PLAN:  PT FREQUENCY: 1-2x/week  PT DURATION: 12 weeks  PLANNED INTERVENTIONS:[id number]- PT Re-evaluation, 97110-Therapeutic exercises, 97530- Therapeutic activity, 97112- Neuromuscular re-education, 97535- Self Care, [id number]- Manual therapy, [phone]- Gait training, [phone]- Orthotic Fit/training, [phone]- Canalith repositioning, [id number]- Aquatic Therapy, [phone]- Splinting, [phone]- Wound care (first 20 sq cm), 97598- Wound care (each additional 20 sq cm)Patient/Family education, Balance training, Stair training, Taping, Dry Needling, Joint mobilization, Joint manipulation, Spinal manipulation, Spinal mobilization, Scar mobilization, and DME instructions.  PLAN FOR NEXT SESSION: progress per protocol, progress PREs as appropriate/tolerated - WB for proximation.   Lili Finder, Student-PT 07/18/2024, 11:45 AM  This entire session was performed under direct supervision and direction of a licensed therapist/therapist assistant . I have personally read, edited and approve of the note as written. 11:50 AM, 07/18/24 Prentice CANDIE Stains PT, DPT Physical Therapist at [facility]

## 2024-07-20 ENCOUNTER — Ambulatory Visit (HOSPITAL_BASED_OUTPATIENT_CLINIC_OR_DEPARTMENT_OTHER): Admitting: Physical Therapy

## 2024-07-20 ENCOUNTER — Encounter (HOSPITAL_BASED_OUTPATIENT_CLINIC_OR_DEPARTMENT_OTHER): Payer: Self-pay | Admitting: Physical Therapy

## 2024-07-20 DIAGNOSIS — M25512 Pain in left shoulder: Secondary | ICD-10-CM | POA: Diagnosis not present

## 2024-07-20 DIAGNOSIS — M25612 Stiffness of left shoulder, not elsewhere classified: Secondary | ICD-10-CM

## 2024-07-20 DIAGNOSIS — R29898 Other symptoms and signs involving the musculoskeletal system: Secondary | ICD-10-CM

## 2024-07-20 DIAGNOSIS — M6281 Muscle weakness (generalized): Secondary | ICD-10-CM

## 2024-07-20 NOTE — Therapy (Addendum)
 OUTPATIENT PHYSICAL THERAPY SHOULDER TREATMENT NOTE   Patient Name: Greg Rogers. MRN: [id number] DOB:08/11/1941, 83 y.o., male Today's Date: 07/20/2024  END OF SESSION:  PT End of Session - 07/20/24 0847     Visit Number 25    Number of Visits 48    Date for Recertification  09/11/24    Authorization Type MCR    Progress Note Due on Visit 28    PT Start Time 0846    PT Stop Time 0930    PT Time Calculation (min) 44 min    Activity Tolerance Patient tolerated treatment well    Behavior During Therapy WFL for tasks assessed/performed          Past Medical History:  Diagnosis Date   Abnormal GGT test    With otherwise normal work up.   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)    Persistent disorder of initiating or maintaining sleep    Pes planus    Rosacea    Snoring    Past Surgical History:  Procedure Laterality Date   APPENDECTOMY  1949   at age 5   CERVICAL FUSION  03/2016   C 3&4   Cyst removed from breast  1960   FOOT SURGERY  01/2009   Left   HERNIA REPAIR  1956   at age 63 (single)   HERNIA REPAIR  1960   (double)   Left 2nd toe removed  03/2017   REVERSE SHOULDER ARTHROPLASTY Left 03/27/2024   Procedure: ARTHROPLASTY, SHOULDER, TOTAL, REVERSE;  Surgeon: Genelle Standing, MD;  Location: [facility];  Service: Orthopedics;  Laterality: Left;   SPINE SURGERY     TONSILLECTOMY AND ADENOIDECTOMY  ~ 1950   VASECTOMY  ~ 1984   Patient Active Problem List   Diagnosis Date Noted   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 03/27/2024   Leg pain 03/21/2024   Shoulder pain 12/07/2023   Change in stool 11/02/2023   Nasal lesion 06/02/2023   Occipital neuralgia 10/12/2020   Wound of right leg, initial encounter  07/03/2019   Healthcare maintenance 03/04/2017   Foot pain 07/22/2016   Cervical stenosis of spine 01/06/2016   thyroid  nodule-benign path on bx 01/2016 01/06/2016   Radicular pain in right arm 12/22/2015   Radicular leg pain 12/22/2015   Back pain 07/06/2015   Constipation 07/06/2015   FH: CAD (coronary artery disease) 05/23/2015   Family history of ischemic heart disease and other diseases of the circulatory system 05/23/2015   Advance care planning 11/26/2014   Pain in joint, shoulder region 11/26/2014   Medicare annual wellness visit, subsequent 09/27/2012   GERD (gastroesophageal reflux disease) 07/05/2012   Irritability 03/29/2011   Advance directive on file 01/09/2011   Pain in joint, other site 01/04/2011   Arthropathy 09/02/2010   NEPHROLITHIASIS, HX OF 09/02/2010   Diabetes mellitus without complication (HCC) 09/01/2010   HLD (hyperlipidemia) 10/24/2009   SNORING 10/24/2009   FOOT PAIN, BILATERAL 10/06/2009   Pes planus 10/06/2009   Acquired flat foot 10/06/2009    PCP: Cleatus Arlyss RAMAN, MD  REFERRING PROVIDER: Genelle Standing, MD  REFERRING DIAG: S46.012A (ICD-10-CM) - 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,  initial encounter;  1. Left shoulder reverse shoulder arthroplasty 2. Left shoulder biceps tenodesis  THERAPY DIAG:  Acute pain of left shoulder  Stiffness of left shoulder, not elsewhere classified  Muscle weakness (generalized)  Other symptoms and signs involving the musculoskeletal system  Rationale for Evaluation and Treatment: Rehabilitation  ONSET DATE: DOS 03/27/24  SUBJECTIVE:                                                                                                                                                                                      SUBJECTIVE STATEMENT: Reports he is doing well this morning but is sore from new exercise last week.   EVAL: Patient states shoulder has been doing good. Using sling. Was very active  prior to injury/surgery with gardening/yard work Hand dominance: Right  PERTINENT HISTORY: DM, HLD, chronic shoulder pain with fall in March while exiting ski lift with RC tear  PAIN:  Are you having pain? Yes 1/10 Location: anterior Lt shoulder/upper arm Description: dull, uncomfortable Worsened by: raising arm to front Relieved by:  massage, icy hot      PRECAUTIONS: Other: L reverse TSA   RED FLAGS: None   WEIGHT BEARING RESTRICTIONS: NWB LUE at eval  FALLS:  Has patient fallen in last 6 months? Yes. Number of falls 1  PLOF: Independent  PATIENT GOALS:regain use of the arm  OBJECTIVE: (objective measures from initial evaluation unless otherwise dated)  OBSERVATION: using sling, waterproof dressing intact  PATIENT SURVEYS:  UEFS  Extreme difficulty/unable (0), Quite a bit of difficulty (1), Moderate difficulty (2), Little difficulty (3), No difficulty (4) Survey date:  03/30/24 06/19/24  Any of your usual work, household or school activities 0   2. Your usual hobbies, recreational/sport activities 1    3. Lifting a bag of groceries to waist level 2    4. Lifting a bag of groceries above your head 0   5. Grooming your hair 4   6. Pushing up on your hands (I.e. from bathtub or chair) 0   7. Preparing food (I.e. peeling/cutting) 1   8. Driving  3   9. Vacuuming, sweeping, or raking 4   10. Dressing  3   11. Doing up buttons 2   12. Using tools/appliances 1   13. Opening doors 0   14. Cleaning  3   15. Tying or lacing shoes 2   16. Sleeping  3   17. Laundering clothes (I.e. washing, ironing, folding) 3   18. Opening a jar 3   19. Throwing a ball 1   20. Carrying a small suitcase with your affected limb.  1   Score total:  35/80 58/80     05/08/24- UEFS 15/80 via online version, later noted questions were different than one in epic   COGNITION: Overall cognitive status: Within functional limits for tasks assessed     SENSATION: WFL  POSTURE: rounded  shoulders and forward head   UPPER EXTREMITY ROM: AROM RUE WFL  Passive ROM Left eval L 04/06/24 L 04/13/24 L 04/20/24 L 04/24/24 L 04/27/24 L 9/17 L  9/19 L 9/26 L 06/01/24 L 06/05/24 L 06/07/24 L 06/19/24  Shoulder flexion 100 PROM 110 AAROM 110* PROM, 120* AAROM supine  120-130* PROM (bit guarded today) 130* PROM and AROM  114 AROM 140 AAROM 145 AAROM 138 AAROM Improves to 149 AAROM 139 AAROM 148 AAROM improves to 153 AAROM 144* AAROM  130 degrees on shoulder flexion ladder  120 before manual, 135 after in supine, 140 on shoulder flexion ladder  135 deg before manual 140  130 AAROM against gravity   Shoulder extension               Shoulder abduction               Shoulder adduction               Shoulder internal rotation         Mid glute. L5 at best with IR stretch     L5  Shoulder external rotation 20 25* next to body PROM setaed (very guarded), 30* AAROM seated  30* PROM arm next to body  30* PROM arm next to body      30* PROM/AAROM supine arm next to body but limited by anterior shoulder pain     60 deg   Elbow flexion               Elbow extension               Wrist flexion               Wrist extension               Wrist ulnar deviation               Wrist radial deviation               Wrist pronation               Wrist supination               (Blank rows = not tested) *=pain/symptoms  UPPER EXTREMITY MMT: NT due to post op status  MMT Right eval Left eval Left 05/11/24 Left 06/19/24  Shoulder flexion   3 UT compensations  3  Shoulder extension      Shoulder abduction   3 UT compensations  3  Shoulder adduction      Shoulder internal rotation    4-  Shoulder external rotation    4-  Middle trapezius      Lower trapezius      Elbow flexion      Elbow extension      Wrist flexion      Wrist extension      Wrist ulnar deviation      Wrist radial deviation      Wrist pronation      Wrist supination      Grip strength (lbs)      (Blank rows = not tested)  *=pain/symptoms    PALPATION:  LUE gross edema  TODAY'S TREATMENT:                                                                                                                                         DATE:  07/20/24 UE bike L4 x4 min fwd/backward  STM to deltoid, biceps  Grade III posterior and inferior GHJ glides  Shoulder PROM Stargazer stretch x5  SL ER x10  Standing shoulder IR x5 RTB Shoulder flexion ladder x10   07/18/24 STM to deltoid, biceps  Grade III posterior and inferior GHJ glides  Shoulder PROM D2 shoulder flexion in supine 3x10 Stargazer stretch in supine x10 Shoulder flexion with perpendicular resistance RTB 2x10    07/10/24 -UBE L1: 30 sec forward/30 sec backward x , towel in small of back for improved posture - quadruped position (for shoulder approximation)- rocking R/L; alternating forward arm lifts; alternating side arm lifts  - counter push up position (for shoulder approximation) - alternating rows, alternating opposite shoulder taps - finger ladder into Lt shoulder flexion  - in front of mirror: bil shoulder flexion to 80deg and steering wheel motion -in front of mirror: bil bicep curl to overhead press with ball -> 2# wt x 10  - reverse mini wall push up x 10  - Stargazer stretch 60 sec in supine (back of Lt palm on forehead) - manual therapy:  STM to Lt pec major, biceps brachii, ant deltoid, latissimus - Lt shoulder flexion 60-110 in supine with 1#, cues for good scapular positioning   PATIENT EDUCATION:  Education details:  HEP, relevant anatomy/biomechanics, protocol/precautions. Person educated: Patient Education method: Explanation, Demonstration,  Education comprehension: verbalized understanding, returned demonstration, verbal cues required, and tactile cues required  HOME EXERCISE PROGRAM:  Access Code: [email or web address] URL:  [email or web address] Date: 04/13/2024 Prepared by: Josette Rough  Exercises - Wrist AROM Flexion Extension  - 3 x daily - 7 x weekly - 2 sets - 10 reps - Seated Elbow Flexion and Extension AROM (Mirrored)  - 3 x daily - 7 x weekly - 2 sets - 10 reps - Circular Shoulder Pendulum with Table Support (Mirrored)  - 3 x daily - 7 x weekly - 2 sets - 10 reps - Seated Scapular Retraction  - 3 x daily - 7 x weekly - 2 sets - 10 reps - Seated Shoulder Flexion Towel Slide at Table Top (Mirrored)  - 3 x daily - 7 x weekly - 10 reps - 10 second hold - Supine Shoulder Flexion AAROM with Hands Clasped  - 3 x daily - 7 x weekly - 3 sets - 10 reps - Supine Shoulder External Rotation with Dowel  - 3 x daily - 7 x weekly - 2 sets - 10 reps - Standing Isometric Shoulder Flexion with Doorway - Arm Bent  - 1 x daily - 7 x weekly - 2 sets - 10 reps - 3 seconds  hold - Standing Isometric  Shoulder Abduction with Doorway - Arm Bent  - 1 x daily - 7 x weekly - 2 sets - 10 reps - 3 seconds  hold - Standing Isometric Shoulder Extension with Doorway - Arm Bent  - 1 x daily - 7 x weekly - 2 sets - 10 reps - 3 seconds  hold - Standing Isometric Shoulder External Rotation with Doorway  - 1 x daily - 7 x weekly - 2 sets - 10 reps - 3 seconds  hold   ASSESSMENT:  CLINICAL IMPRESSION: Pt is 16 wks s/p Lt reverse TSA.  Continued working on functional strengthening today as appropriate, with focus on functional movement patterns and deltoid strengthening. Continued focusing on ER/IR shoulder strengthening to improve global shoulder musculature. Utilized manual therapy to address tightness and trigger points in deltoid and biceps.  Verbal cueing for body mechanics and movement control to prevent UT compensation. Patient is doing very well and is continuing to progress. Will continue to benefit from therapy to address remaining limitations.    EVAL: Patient a 83 y.o. y.o. male who was seen today for physical therapy  evaluation and treatment for L reverse TSA DOS 03/27/24. Patient presents with pain limited deficits in L shoulder strength, ROM, endurance, activity tolerance, and functional mobility with ADL. Patient is having to modify and restrict ADL as indicated by outcome measure score as well as subjective information and objective measures which is affecting overall participation. Patient will benefit from skilled physical therapy in order to improve function and reduce impairment.  OBJECTIVE IMPAIRMENTS: decreased activity tolerance, decreased endurance, decreased mobility, decreased ROM, decreased strength, increased muscle spasms, impaired flexibility, impaired UE functional use, improper body mechanics, and pain  ACTIVITY LIMITATIONS:  carrying, lifting, bending, sleeping, bed mobility, bathing, dressing, reach over head, hygiene/grooming, and caring for others  PARTICIPATION LIMITATIONS:  meal prep, cleaning, laundry, driving, shopping, community activity, and yard work  PERSONAL FACTORS: Age, Time since onset of injury/illness/exacerbation, and 1-2 comorbidities: DM, HLD are also affecting patient's functional outcome.   REHAB POTENTIAL: Good  CLINICAL DECISION MAKING: Evolving/moderate complexity  EVALUATION COMPLEXITY: Moderate  GOALS: Goals reviewed with patient? Yes  SHORT TERM GOALS: Target date: 05/11/2024    Patient will be independent with HEP in order to improve functional outcomes. Baseline: Goal status: MET 06/19/24  2.  Patient will report at least 25% improvement in symptoms for improved quality of life. Baseline:  Goal status: MET 06/19/24  3.  Patient will demonstrate Elevation to 130 deg and ER to 30 deg - passive, active assisted or active  Baseline:  Goal status: MET 04/20/24  4.  Patient will be out of sling per surgeon approval for improved functional use of L shoulder. Baseline:  Goal status: MET 04/13/24   LONG TERM GOALS: Target date: 09/11/2024   Patient will  report at least 75% improvement in symptoms for improved quality of life. Baseline:  Goal status: IN PROGRESS (65% 06/19/24)  2.  Patient will improve UEFS score by at least 25 points in order to indicate improved tolerance to activity. Baseline:  Goal status: IN PROGRESS 06/19/24  3.  Patient will demonstrate at least PROM/AAROM/AROM to: Elevation - 145-160; ER - 40-50 ; functional IR to L1 in L shoulder for improved ability lift overhead. Baseline:  Goal status: IN PROGRESS 06/19/24  4.  Patient will be able to return to all activities unrestricted for improved ability to perform yard work functions and participate with family.  Baseline:  Goal status: IN PROGRESS 07/10/24  5.  Patient will demonstrate grade of 5/5 MMT grade in all tested musculature as evidence of improved strength to assist with lifting at home. Baseline:  Goal status: IN PROGRESS 06/19/24   PLAN:  PT FREQUENCY: 1-2x/week  PT DURATION: 12 weeks  PLANNED INTERVENTIONS:[id number]- PT Re-evaluation, 97110-Therapeutic exercises, 97530- Therapeutic activity, 97112- Neuromuscular re-education, 97535- Self Care, [id number]- Manual therapy, [phone]- Gait training, [phone]- Orthotic Fit/training, [phone]- Canalith repositioning, [id number]- Aquatic Therapy, [phone]- Splinting, [phone]- Wound care (first 20 sq cm), 97598- Wound care (each additional 20 sq cm)Patient/Family education, Balance training, Stair training, Taping, Dry Needling, Joint mobilization, Joint manipulation, Spinal manipulation, Spinal mobilization, Scar mobilization, and DME instructions.  PLAN FOR NEXT SESSION: progress per protocol, progress PREs as appropriate/tolerated - WB for proximation.   Lili Finder, Student-PT 07/20/2024, 9:28 AM  This entire session was performed under direct supervision and direction of a licensed therapist/therapist assistant . I have personally read, edited and approve of the note as written. 9:42 AM, 07/20/24 Prentice CANDIE Stains PT,  DPT Physical Therapist at [facility]

## 2024-07-21 LAB — GENECONNECT MOLECULAR SCREEN: Genetic Analysis Overall Interpretation: NEGATIVE

## 2024-07-24 ENCOUNTER — Encounter (HOSPITAL_BASED_OUTPATIENT_CLINIC_OR_DEPARTMENT_OTHER): Admitting: Physical Therapy

## 2024-07-25 ENCOUNTER — Ambulatory Visit (HOSPITAL_BASED_OUTPATIENT_CLINIC_OR_DEPARTMENT_OTHER): Payer: Self-pay | Admitting: Physical Therapy

## 2024-07-25 ENCOUNTER — Encounter (HOSPITAL_BASED_OUTPATIENT_CLINIC_OR_DEPARTMENT_OTHER): Payer: Self-pay | Admitting: Physical Therapy

## 2024-07-25 DIAGNOSIS — M25512 Pain in left shoulder: Secondary | ICD-10-CM

## 2024-07-25 DIAGNOSIS — R29898 Other symptoms and signs involving the musculoskeletal system: Secondary | ICD-10-CM

## 2024-07-25 DIAGNOSIS — M6281 Muscle weakness (generalized): Secondary | ICD-10-CM

## 2024-07-25 DIAGNOSIS — M25612 Stiffness of left shoulder, not elsewhere classified: Secondary | ICD-10-CM

## 2024-07-25 NOTE — Patient Instructions (Signed)

## 2024-07-25 NOTE — Therapy (Signed)
 OUTPATIENT PHYSICAL THERAPY SHOULDER TREATMENT NOTE   Patient Name: Greg Rogers. MRN: [id number] DOB:1940/11/18, 83 y.o., male Today's Date: 07/25/2024  END OF SESSION:  PT End of Session - 07/25/24 0928     Visit Number 26    Number of Visits 48    Date for Recertification  09/11/24    Authorization Type MCR    Progress Note Due on Visit 28    PT Start Time 0930    PT Stop Time 1010    PT Time Calculation (min) 40 min    Activity Tolerance Patient tolerated treatment well    Behavior During Therapy WFL for tasks assessed/performed          Past Medical History:  Diagnosis Date   Abnormal GGT test    With otherwise normal work up.   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)    Persistent disorder of initiating or maintaining sleep    Pes planus    Rosacea    Snoring    Past Surgical History:  Procedure Laterality Date   APPENDECTOMY  1949   at age 41   CERVICAL FUSION  03/2016   C 3&4   Cyst removed from breast  1960   FOOT SURGERY  01/2009   Left   HERNIA REPAIR  1956   at age 86 (single)   HERNIA REPAIR  1960   (double)   Left 2nd toe removed  03/2017   REVERSE SHOULDER ARTHROPLASTY Left 03/27/2024   Procedure: ARTHROPLASTY, SHOULDER, TOTAL, REVERSE;  Surgeon: Genelle Standing, MD;  Location: [facility];  Service: Orthopedics;  Laterality: Left;   SPINE SURGERY     TONSILLECTOMY AND ADENOIDECTOMY  ~ 1950   VASECTOMY  ~ 1984   Patient Active Problem List   Diagnosis Date Noted   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 03/27/2024   Leg pain 03/21/2024   Shoulder pain 12/07/2023   Change in stool 11/02/2023   Nasal lesion 06/02/2023   Occipital neuralgia 10/12/2020   Wound of right leg, initial encounter  07/03/2019   Healthcare maintenance 03/04/2017   Foot pain 07/22/2016   Cervical stenosis of spine 01/06/2016   thyroid  nodule-benign path on bx 01/2016 01/06/2016   Radicular pain in right arm 12/22/2015   Radicular leg pain 12/22/2015   Back pain 07/06/2015   Constipation 07/06/2015   FH: CAD (coronary artery disease) 05/23/2015   Family history of ischemic heart disease and other diseases of the circulatory system 05/23/2015   Advance care planning 11/26/2014   Pain in joint, shoulder region 11/26/2014   Medicare annual wellness visit, subsequent 09/27/2012   GERD (gastroesophageal reflux disease) 07/05/2012   Irritability 03/29/2011   Advance directive on file 01/09/2011   Pain in joint, other site 01/04/2011   Arthropathy 09/02/2010   NEPHROLITHIASIS, HX OF 09/02/2010   Diabetes mellitus without complication (HCC) 09/01/2010   HLD (hyperlipidemia) 10/24/2009   SNORING 10/24/2009   FOOT PAIN, BILATERAL 10/06/2009   Pes planus 10/06/2009   Acquired flat foot 10/06/2009    PCP: Cleatus Arlyss RAMAN, MD  REFERRING PROVIDER: Genelle Standing, MD  REFERRING DIAG: S46.012A (ICD-10-CM) - 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,  initial encounter;  1. Left shoulder reverse shoulder arthroplasty 2. Left shoulder biceps tenodesis  THERAPY DIAG:  Acute pain of left shoulder  Stiffness of left shoulder, not elsewhere classified  Muscle weakness (generalized)  Other symptoms and signs involving the musculoskeletal system  Rationale for Evaluation and Treatment: Rehabilitation  ONSET DATE: DOS 03/27/24  SUBJECTIVE:                                                                                                                                                                                      SUBJECTIVE STATEMENT: Patient states shoulder is doing well. Used percussion gun on pec when it was sore the other day.   EVAL: Patient states shoulder has been doing good. Using sling.  Was very active prior to injury/surgery with gardening/yard work Hand dominance: Right  PERTINENT HISTORY: DM, HLD, chronic shoulder pain with fall in March while exiting ski lift with RC tear  PAIN:  Are you having pain? Yes 1/10 Location: anterior Lt shoulder/upper arm Description: dull, uncomfortable Worsened by: raising arm to front Relieved by:  massage, icy hot      PRECAUTIONS: Other: L reverse TSA   RED FLAGS: None   WEIGHT BEARING RESTRICTIONS: NWB LUE at eval  FALLS:  Has patient fallen in last 6 months? Yes. Number of falls 1  PLOF: Independent  PATIENT GOALS:regain use of the arm  OBJECTIVE: (objective measures from initial evaluation unless otherwise dated)  OBSERVATION: using sling, waterproof dressing intact  PATIENT SURVEYS:  UEFS  Extreme difficulty/unable (0), Quite a bit of difficulty (1), Moderate difficulty (2), Little difficulty (3), No difficulty (4) Survey date:  03/30/24 06/19/24  Any of your usual work, household or school activities 0   2. Your usual hobbies, recreational/sport activities 1    3. Lifting a bag of groceries to waist level 2    4. Lifting a bag of groceries above your head 0   5. Grooming your hair 4   6. Pushing up on your hands (I.e. from bathtub or chair) 0   7. Preparing food (I.e. peeling/cutting) 1   8. Driving  3   9. Vacuuming, sweeping, or raking 4   10. Dressing  3   11. Doing up buttons 2   12. Using tools/appliances 1   13. Opening doors 0   14. Cleaning  3   15. Tying or lacing shoes 2   16. Sleeping  3   17. Laundering clothes (I.e. washing, ironing, folding) 3   18. Opening a jar 3   19. Throwing a ball 1   20. Carrying a small suitcase with your affected limb.  1  Score total:  35/80 58/80     05/08/24- UEFS 15/80 via online version, later noted questions were different than one in epic   COGNITION: Overall cognitive status: Within functional limits for tasks  assessed     SENSATION: WFL  POSTURE: rounded shoulders and forward head   UPPER EXTREMITY ROM: AROM RUE WFL  Passive ROM Left eval L 04/06/24 L 04/13/24 L 04/20/24 L 04/24/24 L 04/27/24 L 9/17 L  9/19 L 9/26 L 06/01/24 L 06/05/24 L 06/07/24 L 06/19/24  Shoulder flexion 100 PROM 110 AAROM 110* PROM, 120* AAROM supine  120-130* PROM (bit guarded today) 130* PROM and AROM  114 AROM 140 AAROM 145 AAROM 138 AAROM Improves to 149 AAROM 139 AAROM 148 AAROM improves to 153 AAROM 144* AAROM  130 degrees on shoulder flexion ladder  120 before manual, 135 after in supine, 140 on shoulder flexion ladder  135 deg before manual 140  130 AAROM against gravity   Shoulder extension               Shoulder abduction               Shoulder adduction               Shoulder internal rotation         Mid glute. L5 at best with IR stretch     L5  Shoulder external rotation 20 25* next to body PROM setaed (very guarded), 30* AAROM seated  30* PROM arm next to body  30* PROM arm next to body      30* PROM/AAROM supine arm next to body but limited by anterior shoulder pain     60 deg   Elbow flexion               Elbow extension               Wrist flexion               Wrist extension               Wrist ulnar deviation               Wrist radial deviation               Wrist pronation               Wrist supination               (Blank rows = not tested) *=pain/symptoms  UPPER EXTREMITY MMT: NT due to post op status  MMT Right eval Left eval Left 05/11/24 Left 06/19/24  Shoulder flexion   3 UT compensations  3  Shoulder extension      Shoulder abduction   3 UT compensations  3  Shoulder adduction      Shoulder internal rotation    4-  Shoulder external rotation    4-  Middle trapezius      Lower trapezius      Elbow flexion      Elbow extension      Wrist flexion      Wrist extension      Wrist ulnar deviation      Wrist radial deviation      Wrist pronation      Wrist supination      Grip  strength (lbs)      (Blank rows = not tested) *=pain/symptoms    PALPATION:  LUE gross edema  TODAY'S TREATMENT:                                                                                                                                         DATE:  07/25/24 UE bike L4 x4 min fwd/backward  Manual: STM to deltoid, teres minor/major, lats; pin and stretch teres, lats into flexion AAROM flexion with RTB in hands 1 x 10  Manual: STM to L deltoid pre and post dry needling for trigger point identification and muscular relaxation. Trigger Point Dry Needling  Initial Treatment: Pt instructed on Dry Needling rational, procedures, and possible side effects. Pt instructed to expect mild to moderate muscle soreness later in the day and/or into the next day.  Pt instructed in methods to reduce muscle soreness. Pt instructed to continue prescribed HEP. Patient was educated on signs and symptoms of infection and other risk factors and advised to seek medical attention should they occur.  Patient verbalized understanding of these instructions and education.   Patient Verbal Consent Given: Yes Education Handout Provided: Yes Muscles Treated: L deltoid Electrical Stimulation Performed: No Treatment Response/Outcome: decrease in tissue tension    07/20/24 UE bike L4 x4 min fwd/backward  STM to deltoid, biceps  Grade III posterior and inferior GHJ glides  Shoulder PROM Stargazer stretch x5  SL ER x10  Standing shoulder IR x5 RTB Shoulder flexion ladder x10   07/18/24 STM to deltoid, biceps  Grade III posterior and inferior GHJ glides  Shoulder PROM D2 shoulder flexion in supine 3x10 Stargazer stretch in supine x10 Shoulder flexion with perpendicular resistance RTB 2x10    07/10/24 -UBE L1: 30 sec forward/30 sec backward x , towel in small of back for improved posture -  quadruped position (for shoulder approximation)- rocking R/L; alternating forward arm lifts; alternating side arm lifts  - counter push up position (for shoulder approximation) - alternating rows, alternating opposite shoulder taps - finger ladder into Lt shoulder flexion  - in front of mirror: bil shoulder flexion to 80deg and steering wheel motion -in front of mirror: bil bicep curl to overhead press with ball -> 2# wt x 10  - reverse mini wall push up x 10  - Stargazer stretch 60 sec in supine (back of Lt palm on forehead) - manual therapy:  STM to Lt pec major, biceps brachii, ant deltoid, latissimus - Lt shoulder flexion 60-110 in supine with 1#, cues for good scapular positioning   PATIENT EDUCATION:  Education details:  HEP, relevant anatomy/biomechanics, protocol/precautions. Person educated: Patient Education method: Explanation, Demonstration,  Education comprehension: verbalized understanding, returned demonstration, verbal cues required, and tactile cues required  HOME EXERCISE PROGRAM:  Access Code: [email or web address] URL: [email or web address] Date: 04/13/2024 Prepared by: Josette Rough  Exercises - Wrist AROM Flexion Extension  - 3 x daily - 7 x weekly - 2 sets - 10 reps - Seated Elbow Flexion and Extension AROM (Mirrored)  -  3 x daily - 7 x weekly - 2 sets - 10 reps - Circular Shoulder Pendulum with Table Support (Mirrored)  - 3 x daily - 7 x weekly - 2 sets - 10 reps - Seated Scapular Retraction  - 3 x daily - 7 x weekly - 2 sets - 10 reps - Seated Shoulder Flexion Towel Slide at Table Top (Mirrored)  - 3 x daily - 7 x weekly - 10 reps - 10 second hold - Supine Shoulder Flexion AAROM with Hands Clasped  - 3 x daily - 7 x weekly - 3 sets - 10 reps - Supine Shoulder External Rotation with Dowel  - 3 x daily - 7 x weekly - 2 sets - 10 reps - Standing Isometric Shoulder Flexion with Doorway - Arm Bent  - 1 x daily - 7 x weekly - 2 sets - 10 reps - 3 seconds  hold -  Standing Isometric Shoulder Abduction with Doorway - Arm Bent  - 1 x daily - 7 x weekly - 2 sets - 10 reps - 3 seconds  hold - Standing Isometric Shoulder Extension with Doorway - Arm Bent  - 1 x daily - 7 x weekly - 2 sets - 10 reps - 3 seconds  hold - Standing Isometric Shoulder External Rotation with Doorway  - 1 x daily - 7 x weekly - 2 sets - 10 reps - 3 seconds  hold   ASSESSMENT:  CLINICAL IMPRESSION: Patient with limited AROM due to strength, AAROM at 139 which improves to 145 with manual. Patient with hyperactive and tender deltoid, lats, teres group which improves some with manual. Pain with overhead reaching in supine in deltoid limiting mobility. Discussed DN and educated on it. Performed DN with decrease in tissue tension and twitch response, improvement in symptoms following. Educated on HEP today. Patient will continue to benefit from physical therapy in order to improve function and reduce impairment.    EVAL: Patient a 83 y.o. y.o. male who was seen today for physical therapy evaluation and treatment for L reverse TSA DOS 03/27/24. Patient presents with pain limited deficits in L shoulder strength, ROM, endurance, activity tolerance, and functional mobility with ADL. Patient is having to modify and restrict ADL as indicated by outcome measure score as well as subjective information and objective measures which is affecting overall participation. Patient will benefit from skilled physical therapy in order to improve function and reduce impairment.  OBJECTIVE IMPAIRMENTS: decreased activity tolerance, decreased endurance, decreased mobility, decreased ROM, decreased strength, increased muscle spasms, impaired flexibility, impaired UE functional use, improper body mechanics, and pain  ACTIVITY LIMITATIONS:  carrying, lifting, bending, sleeping, bed mobility, bathing, dressing, reach over head, hygiene/grooming, and caring for others  PARTICIPATION LIMITATIONS:  meal prep, cleaning,  laundry, driving, shopping, community activity, and yard work  PERSONAL FACTORS: Age, Time since onset of injury/illness/exacerbation, and 1-2 comorbidities: DM, HLD are also affecting patient's functional outcome.   REHAB POTENTIAL: Good  CLINICAL DECISION MAKING: Evolving/moderate complexity  EVALUATION COMPLEXITY: Moderate  GOALS: Goals reviewed with patient? Yes  SHORT TERM GOALS: Target date: 05/11/2024    Patient will be independent with HEP in order to improve functional outcomes. Baseline: Goal status: MET 06/19/24  2.  Patient will report at least 25% improvement in symptoms for improved quality of life. Baseline:  Goal status: MET 06/19/24  3.  Patient will demonstrate Elevation to 130 deg and ER to 30 deg - passive, active assisted or active  Baseline:  Goal status:  MET 04/20/24  4.  Patient will be out of sling per surgeon approval for improved functional use of L shoulder. Baseline:  Goal status: MET 04/13/24   LONG TERM GOALS: Target date: 09/11/2024   Patient will report at least 75% improvement in symptoms for improved quality of life. Baseline:  Goal status: IN PROGRESS (65% 06/19/24)  2.  Patient will improve UEFS score by at least 25 points in order to indicate improved tolerance to activity. Baseline:  Goal status: IN PROGRESS 06/19/24  3.  Patient will demonstrate at least PROM/AAROM/AROM to: Elevation - 145-160; ER - 40-50 ; functional IR to L1 in L shoulder for improved ability lift overhead. Baseline:  Goal status: IN PROGRESS 06/19/24  4.  Patient will be able to return to all activities unrestricted for improved ability to perform yard work functions and participate with family.  Baseline:  Goal status: IN PROGRESS 07/10/24  5.  Patient will demonstrate grade of 5/5 MMT grade in all tested musculature as evidence of improved strength to assist with lifting at home. Baseline:  Goal status: IN PROGRESS 06/19/24   PLAN:  PT FREQUENCY:  1-2x/week  PT DURATION: 12 weeks  PLANNED INTERVENTIONS:[id number]- PT Re-evaluation, 97110-Therapeutic exercises, 97530- Therapeutic activity, 97112- Neuromuscular re-education, 97535- Self Care, [id number]- Manual therapy, [phone]- Gait training, [phone]- Orthotic Fit/training, [phone]- Canalith repositioning, [id number]- Aquatic Therapy, [phone]- Splinting, [phone]- Wound care (first 20 sq cm), 97598- Wound care (each additional 20 sq cm)Patient/Family education, Balance training, Stair training, Taping, Dry Needling, Joint mobilization, Joint manipulation, Spinal manipulation, Spinal mobilization, Scar mobilization, and DME instructions.  PLAN FOR NEXT SESSION: progress per protocol, progress PREs as appropriate/tolerated - WB for proximation.   Prentice RAMAN Avalon Coppinger, PT 07/25/2024, 9:32 AM

## 2024-07-27 ENCOUNTER — Encounter (HOSPITAL_BASED_OUTPATIENT_CLINIC_OR_DEPARTMENT_OTHER): Payer: Self-pay | Admitting: Physical Therapy

## 2024-07-27 ENCOUNTER — Ambulatory Visit (HOSPITAL_BASED_OUTPATIENT_CLINIC_OR_DEPARTMENT_OTHER): Admitting: Physical Therapy

## 2024-07-27 DIAGNOSIS — M25512 Pain in left shoulder: Secondary | ICD-10-CM | POA: Diagnosis not present

## 2024-07-27 DIAGNOSIS — M6281 Muscle weakness (generalized): Secondary | ICD-10-CM

## 2024-07-27 DIAGNOSIS — M25612 Stiffness of left shoulder, not elsewhere classified: Secondary | ICD-10-CM

## 2024-07-27 DIAGNOSIS — R29898 Other symptoms and signs involving the musculoskeletal system: Secondary | ICD-10-CM

## 2024-07-27 NOTE — Therapy (Signed)
 OUTPATIENT PHYSICAL THERAPY SHOULDER TREATMENT NOTE   Patient Name: Greg Rogers. MRN: [id number] DOB:03-10-1941, 83 y.o., male Today's Date: 07/27/2024  END OF SESSION:  PT End of Session - 07/27/24 1018     Visit Number 27    Number of Visits 48    Date for Recertification  09/11/24    Authorization Type MCR    Progress Note Due on Visit 28    PT Start Time 1018    PT Stop Time 1058    PT Time Calculation (min) 40 min    Activity Tolerance Patient tolerated treatment well    Behavior During Therapy WFL for tasks assessed/performed          Past Medical History:  Diagnosis Date   Abnormal GGT test    With otherwise normal work up.   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)    Persistent disorder of initiating or maintaining sleep    Pes planus    Rosacea    Snoring    Past Surgical History:  Procedure Laterality Date   APPENDECTOMY  1949   at age 89   CERVICAL FUSION  03/2016   C 3&4   Cyst removed from breast  1960   FOOT SURGERY  01/2009   Left   HERNIA REPAIR  1956   at age 30 (single)   HERNIA REPAIR  1960   (double)   Left 2nd toe removed  03/2017   REVERSE SHOULDER ARTHROPLASTY Left 03/27/2024   Procedure: ARTHROPLASTY, SHOULDER, TOTAL, REVERSE;  Surgeon: Genelle Standing, MD;  Location: [facility];  Service: Orthopedics;  Laterality: Left;   SPINE SURGERY     TONSILLECTOMY AND ADENOIDECTOMY  ~ 1950   VASECTOMY  ~ 1984   Patient Active Problem List   Diagnosis Date Noted   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 03/27/2024   Leg pain 03/21/2024   Shoulder pain 12/07/2023   Change in stool 11/02/2023   Nasal lesion 06/02/2023   Occipital neuralgia 10/12/2020   Wound of right leg, initial encounter  07/03/2019   Healthcare maintenance 03/04/2017   Foot pain 07/22/2016   Cervical stenosis of spine 01/06/2016   thyroid  nodule-benign path on bx 01/2016 01/06/2016   Radicular pain in right arm 12/22/2015   Radicular leg pain 12/22/2015   Back pain 07/06/2015   Constipation 07/06/2015   FH: CAD (coronary artery disease) 05/23/2015   Family history of ischemic heart disease and other diseases of the circulatory system 05/23/2015   Advance care planning 11/26/2014   Pain in joint, shoulder region 11/26/2014   Medicare annual wellness visit, subsequent 09/27/2012   GERD (gastroesophageal reflux disease) 07/05/2012   Irritability 03/29/2011   Advance directive on file 01/09/2011   Pain in joint, other site 01/04/2011   Arthropathy 09/02/2010   NEPHROLITHIASIS, HX OF 09/02/2010   Diabetes mellitus without complication (HCC) 09/01/2010   HLD (hyperlipidemia) 10/24/2009   SNORING 10/24/2009   FOOT PAIN, BILATERAL 10/06/2009   Pes planus 10/06/2009   Acquired flat foot 10/06/2009    PCP: Cleatus Arlyss RAMAN, MD  REFERRING PROVIDER: Genelle Standing, MD  REFERRING DIAG: S46.012A (ICD-10-CM) - 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,  initial encounter;  1. Left shoulder reverse shoulder arthroplasty 2. Left shoulder biceps tenodesis  THERAPY DIAG:  Acute pain of left shoulder  Stiffness of left shoulder, not elsewhere classified  Muscle weakness (generalized)  Other symptoms and signs involving the musculoskeletal system  Rationale for Evaluation and Treatment: Rehabilitation  ONSET DATE: DOS 03/27/24  SUBJECTIVE:                                                                                                                                                                                      SUBJECTIVE STATEMENT: Patient states feeling pretty good. No adverse effects from DN. Less painful with doing things since. Greater than 50% less pain in shoulder with AROM.   EVAL:  Patient states shoulder has been doing good. Using sling. Was very active prior to injury/surgery with gardening/yard work Hand dominance: Right  PERTINENT HISTORY: DM, HLD, chronic shoulder pain with fall in March while exiting ski lift with RC tear  PAIN:  Are you having pain? Yes 1/10 Location: anterior Lt shoulder/upper arm Description: dull, uncomfortable Worsened by: raising arm to front Relieved by:  massage, icy hot      PRECAUTIONS: Other: L reverse TSA   RED FLAGS: None   WEIGHT BEARING RESTRICTIONS: NWB LUE at eval  FALLS:  Has patient fallen in last 6 months? Yes. Number of falls 1  PLOF: Independent  PATIENT GOALS:regain use of the arm  OBJECTIVE: (objective measures from initial evaluation unless otherwise dated)  OBSERVATION: using sling, waterproof dressing intact  PATIENT SURVEYS:  UEFS  Extreme difficulty/unable (0), Quite a bit of difficulty (1), Moderate difficulty (2), Little difficulty (3), No difficulty (4) Survey date:  03/30/24 06/19/24  Any of your usual work, household or school activities 0   2. Your usual hobbies, recreational/sport activities 1    3. Lifting a bag of groceries to waist level 2    4. Lifting a bag of groceries above your head 0   5. Grooming your hair 4   6. Pushing up on your hands (I.e. from bathtub or chair) 0   7. Preparing food (I.e. peeling/cutting) 1   8. Driving  3   9. Vacuuming, sweeping, or raking 4   10. Dressing  3   11. Doing up buttons 2   12. Using tools/appliances 1   13. Opening doors 0   14. Cleaning  3   15. Tying or lacing shoes 2   16. Sleeping  3   17. Laundering clothes (I.e. washing, ironing, folding) 3   18. Opening a jar 3   19. Throwing a ball 1   20. Carrying a scientist, water quality with  your affected limb.  1   Score total:  35/80 58/80     05/08/24- UEFS 15/80 via online version, later noted questions were different than one in epic   COGNITION: Overall cognitive status: Within functional  limits for tasks assessed     SENSATION: WFL  POSTURE: rounded shoulders and forward head   UPPER EXTREMITY ROM: AROM RUE WFL  Passive ROM Left eval L 04/06/24 L 04/13/24 L 04/20/24 L 04/24/24 L 04/27/24 L 9/17 L  9/19 L 9/26 L 06/01/24 L 06/05/24 L 06/07/24 L 06/19/24 L 07/27/24  Shoulder flexion 100 PROM 110 AAROM 110* PROM, 120* AAROM supine  120-130* PROM (bit guarded today) 130* PROM and AROM  114 AROM 140 AAROM 145 AAROM 138 AAROM Improves to 149 AAROM 139 AAROM 148 AAROM improves to 153 AAROM 144* AAROM  130 degrees on shoulder flexion ladder  120 before manual, 135 after in supine, 140 on shoulder flexion ladder  135 deg before manual 140  130 AAROM against gravity  149 AAROM  Shoulder extension                Shoulder abduction                Shoulder adduction                Shoulder internal rotation         Mid glute. L5 at best with IR stretch     L5   Shoulder external rotation 20 25* next to body PROM setaed (very guarded), 30* AAROM seated  30* PROM arm next to body  30* PROM arm next to body      30* PROM/AAROM supine arm next to body but limited by anterior shoulder pain     60 deg    Elbow flexion                Elbow extension                Wrist flexion                Wrist extension                Wrist ulnar deviation                Wrist radial deviation                Wrist pronation                Wrist supination                (Blank rows = not tested) *=pain/symptoms  UPPER EXTREMITY MMT: NT due to post op status  MMT Right eval Left eval Left 05/11/24 Left 06/19/24  Shoulder flexion   3 UT compensations  3  Shoulder extension      Shoulder abduction   3 UT compensations  3  Shoulder adduction      Shoulder internal rotation    4-  Shoulder external rotation    4-  Middle trapezius      Lower trapezius      Elbow flexion      Elbow extension      Wrist flexion      Wrist extension      Wrist ulnar deviation      Wrist radial deviation       Wrist pronation      Wrist supination      Grip strength (  lbs)      (Blank rows = not tested) *=pain/symptoms    PALPATION:  LUE gross edema                                                                                                       TODAY'S TREATMENT:                                                                                                                                         DATE:  07/27/24 Manual: STM to deltoid, teres minor/major, lats; pin and stretch teres, lats into flexion, pin and stretch pec, Grade II-III Distal clavicle mobs Supine bilateral ER RTB 2 x 10 Supine horizontal abduction RTB  2 x 10 Supine shoulder PNF D2 1 x 8 - pain in deltoid trigger poing Supine Shoulder flexion/PNF D2 RTB to about 90 2 x 10  Shoulder flexion stretch at rail   07/25/24 UE bike L4 x4 min fwd/backward  Manual: STM to deltoid, teres minor/major, lats; pin and stretch teres, lats into flexion AAROM flexion with RTB in hands 1 x 10  Manual: STM to L deltoid pre and post dry needling for trigger point identification and muscular relaxation. Trigger Point Dry Needling  Initial Treatment: Pt instructed on Dry Needling rational, procedures, and possible side effects. Pt instructed to expect mild to moderate muscle soreness later in the day and/or into the next day.  Pt instructed in methods to reduce muscle soreness. Pt instructed to continue prescribed HEP. Patient was educated on signs and symptoms of infection and other risk factors and advised to seek medical attention should they occur.  Patient verbalized understanding of these instructions and education.   Patient Verbal Consent Given: Yes Education Handout Provided: Yes Muscles Treated: L deltoid Electrical Stimulation Performed: No Treatment Response/Outcome: decrease in tissue tension    07/20/24 UE bike L4 x4 min fwd/backward  STM to deltoid, biceps  Grade III posterior and inferior GHJ glides  Shoulder  PROM Stargazer stretch x5  SL ER x10  Standing shoulder IR x5 RTB Shoulder flexion ladder x10   07/18/24 STM to deltoid, biceps  Grade III posterior and inferior GHJ glides  Shoulder PROM D2 shoulder flexion in supine 3x10 Stargazer stretch in supine x10 Shoulder flexion with perpendicular resistance RTB 2x10    07/10/24 -UBE L1: 30 sec forward/30 sec backward x , towel in small of back for improved posture - quadruped position (for shoulder approximation)- rocking R/L; alternating forward  arm lifts; alternating side arm lifts  - counter push up position (for shoulder approximation) - alternating rows, alternating opposite shoulder taps - finger ladder into Lt shoulder flexion  - in front of mirror: bil shoulder flexion to 80deg and steering wheel motion -in front of mirror: bil bicep curl to overhead press with ball -> 2# wt x 10  - reverse mini wall push up x 10  - Stargazer stretch 60 sec in supine (back of Lt palm on forehead) - manual therapy:  STM to Lt pec major, biceps brachii, ant deltoid, latissimus - Lt shoulder flexion 60-110 in supine with 1#, cues for good scapular positioning   PATIENT EDUCATION:  Education details:  HEP, relevant anatomy/biomechanics, protocol/precautions. Person educated: Patient Education method: Explanation, Demonstration,  Education comprehension: verbalized understanding, returned demonstration, verbal cues required, and tactile cues required  HOME EXERCISE PROGRAM:  Access Code: [email or web address] URL: [email or web address] Date: 04/13/2024 Prepared by: Josette Rough  Exercises - Wrist AROM Flexion Extension  - 3 x daily - 7 x weekly - 2 sets - 10 reps - Seated Elbow Flexion and Extension AROM (Mirrored)  - 3 x daily - 7 x weekly - 2 sets - 10 reps - Circular Shoulder Pendulum with Table Support (Mirrored)  - 3 x daily - 7 x weekly - 2 sets - 10 reps - Seated Scapular Retraction  - 3 x daily - 7 x weekly - 2 sets - 10 reps -  Seated Shoulder Flexion Towel Slide at Table Top (Mirrored)  - 3 x daily - 7 x weekly - 10 reps - 10 second hold - Supine Shoulder Flexion AAROM with Hands Clasped  - 3 x daily - 7 x weekly - 3 sets - 10 reps - Supine Shoulder External Rotation with Dowel  - 3 x daily - 7 x weekly - 2 sets - 10 reps - Standing Isometric Shoulder Flexion with Doorway - Arm Bent  - 1 x daily - 7 x weekly - 2 sets - 10 reps - 3 seconds  hold - Standing Isometric Shoulder Abduction with Doorway - Arm Bent  - 1 x daily - 7 x weekly - 2 sets - 10 reps - 3 seconds  hold - Standing Isometric Shoulder Extension with Doorway - Arm Bent  - 1 x daily - 7 x weekly - 2 sets - 10 reps - 3 seconds  hold - Standing Isometric Shoulder External Rotation with Doorway  - 1 x daily - 7 x weekly - 2 sets - 10 reps - 3 seconds  hold   ASSESSMENT:  CLINICAL IMPRESSION: Patient with improving AAROM at beginning of session starting at 149. Continued with manual to hyperactive and tender musculature with decrease in tissue tension following. Continued with shoulder strengthening which is tolerated well. Continued trigger point tender in deltoid with PNF exercise, some improvement with manual, no symptoms with reduced range. Cueing for scapular mechanics throughout. Patient will continue to benefit from physical therapy in order to improve function and reduce impairment.    EVAL: Patient a 83 y.o. y.o. male who was seen today for physical therapy evaluation and treatment for L reverse TSA DOS 03/27/24. Patient presents with pain limited deficits in L shoulder strength, ROM, endurance, activity tolerance, and functional mobility with ADL. Patient is having to modify and restrict ADL as indicated by outcome measure score as well as subjective information and objective measures which is affecting overall participation. Patient will benefit from skilled physical therapy in order to improve function  and reduce impairment.  OBJECTIVE IMPAIRMENTS:  decreased activity tolerance, decreased endurance, decreased mobility, decreased ROM, decreased strength, increased muscle spasms, impaired flexibility, impaired UE functional use, improper body mechanics, and pain  ACTIVITY LIMITATIONS:  carrying, lifting, bending, sleeping, bed mobility, bathing, dressing, reach over head, hygiene/grooming, and caring for others  PARTICIPATION LIMITATIONS:  meal prep, cleaning, laundry, driving, shopping, community activity, and yard work  PERSONAL FACTORS: Age, Time since onset of injury/illness/exacerbation, and 1-2 comorbidities: DM, HLD are also affecting patient's functional outcome.   REHAB POTENTIAL: Good  CLINICAL DECISION MAKING: Evolving/moderate complexity  EVALUATION COMPLEXITY: Moderate  GOALS: Goals reviewed with patient? Yes  SHORT TERM GOALS: Target date: 05/11/2024    Patient will be independent with HEP in order to improve functional outcomes. Baseline: Goal status: MET 06/19/24  2.  Patient will report at least 25% improvement in symptoms for improved quality of life. Baseline:  Goal status: MET 06/19/24  3.  Patient will demonstrate Elevation to 130 deg and ER to 30 deg - passive, active assisted or active  Baseline:  Goal status: MET 04/20/24  4.  Patient will be out of sling per surgeon approval for improved functional use of L shoulder. Baseline:  Goal status: MET 04/13/24   LONG TERM GOALS: Target date: 09/11/2024   Patient will report at least 75% improvement in symptoms for improved quality of life. Baseline:  Goal status: IN PROGRESS (65% 06/19/24)  2.  Patient will improve UEFS score by at least 25 points in order to indicate improved tolerance to activity. Baseline:  Goal status: IN PROGRESS 06/19/24  3.  Patient will demonstrate at least PROM/AAROM/AROM to: Elevation - 145-160; ER - 40-50 ; functional IR to L1 in L shoulder for improved ability lift overhead. Baseline:  Goal status: IN PROGRESS 06/19/24  4.   Patient will be able to return to all activities unrestricted for improved ability to perform yard work functions and participate with family.  Baseline:  Goal status: IN PROGRESS 07/10/24  5.  Patient will demonstrate grade of 5/5 MMT grade in all tested musculature as evidence of improved strength to assist with lifting at home. Baseline:  Goal status: IN PROGRESS 06/19/24   PLAN:  PT FREQUENCY: 1-2x/week  PT DURATION: 12 weeks  PLANNED INTERVENTIONS:[id number]- PT Re-evaluation, 97110-Therapeutic exercises, 97530- Therapeutic activity, 97112- Neuromuscular re-education, 97535- Self Care, [id number]- Manual therapy, [phone]- Gait training, [phone]- Orthotic Fit/training, [phone]- Canalith repositioning, [id number]- Aquatic Therapy, [phone]- Splinting, [phone]- Wound care (first 20 sq cm), 97598- Wound care (each additional 20 sq cm)Patient/Family education, Balance training, Stair training, Taping, Dry Needling, Joint mobilization, Joint manipulation, Spinal manipulation, Spinal mobilization, Scar mobilization, and DME instructions.  PLAN FOR NEXT SESSION: progress per protocol, progress PREs as appropriate/tolerated - WB for proximation.   Prentice RAMAN Jeremaih Klima, PT 07/27/2024, 10:18 AM

## 2024-07-31 ENCOUNTER — Encounter (HOSPITAL_BASED_OUTPATIENT_CLINIC_OR_DEPARTMENT_OTHER): Payer: Self-pay | Admitting: Physical Therapy

## 2024-07-31 ENCOUNTER — Ambulatory Visit (HOSPITAL_BASED_OUTPATIENT_CLINIC_OR_DEPARTMENT_OTHER): Admitting: Physical Therapy

## 2024-07-31 DIAGNOSIS — M25612 Stiffness of left shoulder, not elsewhere classified: Secondary | ICD-10-CM

## 2024-07-31 DIAGNOSIS — M25512 Pain in left shoulder: Secondary | ICD-10-CM | POA: Diagnosis not present

## 2024-07-31 DIAGNOSIS — R29898 Other symptoms and signs involving the musculoskeletal system: Secondary | ICD-10-CM

## 2024-07-31 DIAGNOSIS — M6281 Muscle weakness (generalized): Secondary | ICD-10-CM

## 2024-07-31 NOTE — Therapy (Signed)
 OUTPATIENT PHYSICAL THERAPY SHOULDER TREATMENT NOTE   Patient Name: Greg Rogers. MRN: [id number] DOB:06-25-1941, 83 y.o., male Today's Date: 07/31/2024  Progress Note   Reporting Period 11/4/25to 07/31/24   See note below for Objective Data and Assessment of Progress/Goals   END OF SESSION:  PT End of Session - 07/31/24 0852     Visit Number 28    Number of Visits 48    Date for Recertification  09/11/24    Authorization Type MCR    Progress Note Due on Visit 28    PT Start Time 0851    PT Stop Time 0929    PT Time Calculation (min) 38 min    Activity Tolerance Patient tolerated treatment well    Behavior During Therapy WFL for tasks assessed/performed          Past Medical History:  Diagnosis Date   Abnormal GGT test    With otherwise normal work up.   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)    Persistent disorder of initiating or maintaining sleep    Pes planus    Rosacea    Snoring    Past Surgical History:  Procedure Laterality Date   APPENDECTOMY  1949   at age 19   CERVICAL FUSION  03/2016   C 3&4   Cyst removed from breast  1960   FOOT SURGERY  01/2009   Left   HERNIA REPAIR  1956   at age 15 (single)   HERNIA REPAIR  1960   (double)   Left 2nd toe removed  03/2017   REVERSE SHOULDER ARTHROPLASTY Left 03/27/2024   Procedure: ARTHROPLASTY, SHOULDER, TOTAL, REVERSE;  Surgeon: Genelle Standing, MD;  Location: [facility];  Service: Orthopedics;  Laterality: Left;   SPINE SURGERY     TONSILLECTOMY AND ADENOIDECTOMY  ~ 1950   VASECTOMY  ~ 1984   Patient Active Problem List   Diagnosis Date Noted   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 03/27/2024   Leg pain 03/21/2024   Shoulder pain 12/07/2023    Change in stool 11/02/2023   Nasal lesion 06/02/2023   Occipital neuralgia 10/12/2020   Wound of right leg, initial encounter 07/03/2019   Healthcare maintenance 03/04/2017   Foot pain 07/22/2016   Cervical stenosis of spine 01/06/2016   thyroid  nodule-benign path on bx 01/2016 01/06/2016   Radicular pain in right arm 12/22/2015   Radicular leg pain 12/22/2015   Back pain 07/06/2015   Constipation 07/06/2015   FH: CAD (coronary artery disease) 05/23/2015   Family history of ischemic heart disease and other diseases of the circulatory system 05/23/2015   Advance care planning 11/26/2014   Pain in joint, shoulder region 11/26/2014   Medicare annual wellness visit, subsequent 09/27/2012   GERD (gastroesophageal reflux disease) 07/05/2012   Irritability 03/29/2011   Advance directive on file 01/09/2011   Pain in joint, other site 01/04/2011   Arthropathy 09/02/2010   NEPHROLITHIASIS, HX OF 09/02/2010   Diabetes mellitus without complication (HCC) 09/01/2010   HLD (hyperlipidemia) 10/24/2009   SNORING 10/24/2009   FOOT PAIN, BILATERAL 10/06/2009   Pes planus 10/06/2009   Acquired flat foot 10/06/2009    PCP: Cleatus,  Arlyss RAMAN, MD  REFERRING PROVIDER: Genelle Standing, MD  REFERRING DIAG: [phone] (ICD-10-CM) - 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, initial encounter;  1. Left shoulder reverse shoulder arthroplasty 2. Left shoulder biceps tenodesis  THERAPY DIAG:  Acute pain of left shoulder  Stiffness of left shoulder, not elsewhere classified  Muscle weakness (generalized)  Other symptoms and signs involving the musculoskeletal system  Rationale for Evaluation and Treatment: Rehabilitation  ONSET DATE: DOS 03/27/24  SUBJECTIVE:                                                                                                                                                                                      SUBJECTIVE STATEMENT: Patient states doing HEP. States 75%  functional status with remaining deficits in strength. Strength is limited with being able to work at home and lift.   EVAL: Patient states shoulder has been doing good. Using sling. Was very active prior to injury/surgery with gardening/yard work Hand dominance: Right  PERTINENT HISTORY: DM, HLD, chronic shoulder pain with fall in March while exiting ski lift with RC tear  PAIN:  Are you having pain? Yes 0/10 Location: anterior Lt shoulder/upper arm Description: dull, uncomfortable Worsened by: raising arm to front Relieved by:  massage, icy hot      PRECAUTIONS: Other: L reverse TSA   RED FLAGS: None   WEIGHT BEARING RESTRICTIONS: NWB LUE at eval  FALLS:  Has patient fallen in last 6 months? Yes. Number of falls 1  PLOF: Independent  PATIENT GOALS:regain use of the arm  OBJECTIVE: (objective measures from initial evaluation unless otherwise dated)  OBSERVATION: using sling, waterproof dressing intact  PATIENT SURVEYS:  UEFS  Extreme difficulty/unable (0), Quite a bit of difficulty (1), Moderate difficulty (2), Little difficulty (3), No difficulty (4) Survey date:  03/30/24 06/19/24 07/31/24  Any of your usual work, household or school activities 0  3  2. Your usual hobbies, recreational/sport activities 1  2   3. Lifting a bag of groceries to waist level 2  3   4. Lifting a bag of groceries above your head 0  1  5. Grooming your hair 4  4  6. Pushing up on your hands (I.e. from bathtub or chair) 0  3  7. Preparing food (I.e. peeling/cutting) 1  4  8. Driving  3  4  9. Vacuuming, sweeping, or raking 4  4  10. Dressing  3  4  11. Doing up buttons 2  3  12. Using tools/appliances 1  4  13. Opening doors 0  3  14. Cleaning  3  4  15. Tying or lacing shoes 2  4  16. Sleeping  3  4  17. Laundering clothes (I.e. washing, ironing, folding) 3  4  18. Opening a jar 3  3  19. Throwing a ball 1  3  20. Carrying a small suitcase with your affected limb.  1  4  Score  total:  35/80 58/80 68/80      05/08/24- UEFS 15/80 via online version, later noted questions were different than one in epic   COGNITION: Overall cognitive status: Within functional limits for tasks assessed     SENSATION: WFL  POSTURE: rounded shoulders and forward head   UPPER EXTREMITY ROM: AROM RUE WFL  Passive ROM Left eval L 04/06/24 L 04/13/24 L 04/20/24 L 04/24/24 L 04/27/24 L 9/17 L  9/19 L 9/26 L 06/01/24 L 06/05/24 L 06/07/24 L 06/19/24 L 07/27/24 L 07/31/24  Shoulder flexion 100 PROM 110 AAROM 110* PROM, 120* AAROM supine  120-130* PROM (bit guarded today) 130* PROM and AROM  114 AROM 140 AAROM 145 AAROM 138 AAROM Improves to 149 AAROM 139 AAROM 148 AAROM improves to 153 AAROM 144* AAROM  130 degrees on shoulder flexion ladder  120 before manual, 135 after in supine, 140 on shoulder flexion ladder  135 deg before manual 140  130 AAROM against gravity  149 AAROM 145 AAROM AROM 90 - limited by strength  Shoulder extension                 Shoulder abduction                 Shoulder adduction                 Shoulder internal rotation         Mid glute. L5 at best with IR stretch     L5  L4  Shoulder external rotation 20 25* next to body PROM setaed (very guarded), 30* AAROM seated  30* PROM arm next to body  30* PROM arm next to body      30* PROM/AAROM supine arm next to body but limited by anterior shoulder pain     60 deg   C7  Elbow flexion                 Elbow extension                 Wrist flexion                 Wrist extension                 Wrist ulnar deviation                 Wrist radial deviation                 Wrist pronation                 Wrist supination                 (Blank rows = not tested) *=pain/symptoms  UPPER EXTREMITY MMT: NT due to post op status  MMT Right eval Left eval Left 05/11/24 Left 06/19/24 Right 07/31/24 Left 07/31/24  Shoulder flexion   3 UT compensations  3 4 3   Shoulder extension        Shoulder abduction   3 UT  compensations  3 4 3   Shoulder adduction        Shoulder internal rotation    4- 5 4-  Shoulder  external rotation    4- 4 4-  Middle trapezius        Lower trapezius        Elbow flexion        Elbow extension        Wrist flexion        Wrist extension        Wrist ulnar deviation        Wrist radial deviation        Wrist pronation        Wrist supination        Grip strength (lbs)        (Blank rows = not tested) *=pain/symptoms    PALPATION:  LUE gross edema                                                                                                       TODAY'S TREATMENT:                                                                                                                                         DATE:  07/31/24 Pulley 2 minutes for warm up and ROM Reassessment Manual: STM to deltoid Supine bilateral ER RTB 2 x 10 Supine horizontal abduction RTB  2 x 10 Supine shoulder PNF D2 1 x 8 - pain in deltoid trigger poing Supine Shoulder flexion/PNF D2 RTB 2 x 10  Standing shoulder extension RTB 2 x 10   07/27/24 Manual: STM to deltoid, teres minor/major, lats; pin and stretch teres, lats into flexion, pin and stretch pec, Grade II-III Distal clavicle mobs Supine bilateral ER RTB 2 x 10 Supine horizontal abduction RTB  2 x 10 Supine shoulder PNF D2 1 x 8 - pain in deltoid trigger poing Supine Shoulder flexion/PNF D2 RTB to about 90 2 x 10  Shoulder flexion stretch at rail   07/25/24 UE bike L4 x4 min fwd/backward  Manual: STM to deltoid, teres minor/major, lats; pin and stretch teres, lats into flexion AAROM flexion with RTB in hands 1 x 10  Manual: STM to L deltoid pre and post dry needling for trigger point identification and muscular relaxation. Trigger Point Dry Needling  Initial Treatment: Pt instructed on Dry Needling rational, procedures, and possible side effects. Pt instructed to expect mild to moderate muscle soreness later in the day and/or  into the next day.  Pt instructed in methods to reduce muscle soreness. Pt instructed to continue prescribed HEP. Patient  was educated on signs and symptoms of infection and other risk factors and advised to seek medical attention should they occur.  Patient verbalized understanding of these instructions and education.   Patient Verbal Consent Given: Yes Education Handout Provided: Yes Muscles Treated: L deltoid Electrical Stimulation Performed: No Treatment Response/Outcome: decrease in tissue tension    07/20/24 UE bike L4 x4 min fwd/backward  STM to deltoid, biceps  Grade III posterior and inferior GHJ glides  Shoulder PROM Stargazer stretch x5  SL ER x10  Standing shoulder IR x5 RTB Shoulder flexion ladder x10   07/18/24 STM to deltoid, biceps  Grade III posterior and inferior GHJ glides  Shoulder PROM D2 shoulder flexion in supine 3x10 Stargazer stretch in supine x10 Shoulder flexion with perpendicular resistance RTB 2x10    07/10/24 -UBE L1: 30 sec forward/30 sec backward x , towel in small of back for improved posture - quadruped position (for shoulder approximation)- rocking R/L; alternating forward arm lifts; alternating side arm lifts  - counter push up position (for shoulder approximation) - alternating rows, alternating opposite shoulder taps - finger ladder into Lt shoulder flexion  - in front of mirror: bil shoulder flexion to 80deg and steering wheel motion -in front of mirror: bil bicep curl to overhead press with ball -> 2# wt x 10  - reverse mini wall push up x 10  - Stargazer stretch 60 sec in supine (back of Lt palm on forehead) - manual therapy:  STM to Lt pec major, biceps brachii, ant deltoid, latissimus - Lt shoulder flexion 60-110 in supine with 1#, cues for good scapular positioning   PATIENT EDUCATION:  Education details:  HEP, relevant anatomy/biomechanics, protocol/precautions. 07/31/24: reassessment findings, progress made, POC,  HEP Person educated: Patient Education method: Explanation, Demonstration,  Education comprehension: verbalized understanding, returned demonstration, verbal cues required, and tactile cues required  HOME EXERCISE PROGRAM:  Access Code: [email or web address] URL: [email or web address] Date: 04/13/2024 Prepared by: Josette Rough  Exercises - Wrist AROM Flexion Extension  - 3 x daily - 7 x weekly - 2 sets - 10 reps - Seated Elbow Flexion and Extension AROM (Mirrored)  - 3 x daily - 7 x weekly - 2 sets - 10 reps - Circular Shoulder Pendulum with Table Support (Mirrored)  - 3 x daily - 7 x weekly - 2 sets - 10 reps - Seated Scapular Retraction  - 3 x daily - 7 x weekly - 2 sets - 10 reps - Seated Shoulder Flexion Towel Slide at Table Top (Mirrored)  - 3 x daily - 7 x weekly - 10 reps - 10 second hold - Supine Shoulder Flexion AAROM with Hands Clasped  - 3 x daily - 7 x weekly - 3 sets - 10 reps - Supine Shoulder External Rotation with Dowel  - 3 x daily - 7 x weekly - 2 sets - 10 reps - Standing Isometric Shoulder Flexion with Doorway - Arm Bent  - 1 x daily - 7 x weekly - 2 sets - 10 reps - 3 seconds  hold - Standing Isometric Shoulder Abduction with Doorway - Arm Bent  - 1 x daily - 7 x weekly - 2 sets - 10 reps - 3 seconds  hold - Standing Isometric Shoulder Extension with Doorway - Arm Bent  - 1 x daily - 7 x weekly - 2 sets - 10 reps - 3 seconds  hold - Standing Isometric Shoulder External Rotation with Doorway  - 1 x daily - 7 x weekly -  2 sets - 10 reps - 3 seconds  hold   ASSESSMENT:  CLINICAL IMPRESSION: Patient has met 4/4 short term goals and 2/5 long term goals with ability to complete HEP and improvement in symptoms, strength, ROM, activity tolerance,  and functional mobility. Remaining goals not met due to continued deficits in L shoulder ROM, strength, activity tolerance, and functional mobility. Patient has made good progress toward remaining goals and is overall making good  but slow progress. Modified and condensed HEP. Continued hyperactive deltoid with trigger points.  Patient will continue to benefit from skilled physical therapy in order to improve function and reduce impairment.     EVAL: Patient a 83 y.o. y.o. male who was seen today for physical therapy evaluation and treatment for L reverse TSA DOS 03/27/24. Patient presents with pain limited deficits in L shoulder strength, ROM, endurance, activity tolerance, and functional mobility with ADL. Patient is having to modify and restrict ADL as indicated by outcome measure score as well as subjective information and objective measures which is affecting overall participation. Patient will benefit from skilled physical therapy in order to improve function and reduce impairment.  OBJECTIVE IMPAIRMENTS: decreased activity tolerance, decreased endurance, decreased mobility, decreased ROM, decreased strength, increased muscle spasms, impaired flexibility, impaired UE functional use, improper body mechanics, and pain  ACTIVITY LIMITATIONS:  carrying, lifting, bending, sleeping, bed mobility, bathing, dressing, reach over head, hygiene/grooming, and caring for others  PARTICIPATION LIMITATIONS:  meal prep, cleaning, laundry, driving, shopping, community activity, and yard work  PERSONAL FACTORS: Age, Time since onset of injury/illness/exacerbation, and 1-2 comorbidities: DM, HLD are also affecting patient's functional outcome.   REHAB POTENTIAL: Good  CLINICAL DECISION MAKING: Evolving/moderate complexity  EVALUATION COMPLEXITY: Moderate  GOALS: Goals reviewed with patient? Yes  SHORT TERM GOALS: Target date: 05/11/2024    Patient will be independent with HEP in order to improve functional outcomes. Baseline: Goal status: MET 06/19/24  2.  Patient will report at least 25% improvement in symptoms for improved quality of life. Baseline:  Goal status: MET 06/19/24  3.  Patient will demonstrate Elevation to 130  deg and ER to 30 deg - passive, active assisted or active  Baseline:  Goal status: MET 04/20/24  4.  Patient will be out of sling per surgeon approval for improved functional use of L shoulder. Baseline:  Goal status: MET 04/13/24   LONG TERM GOALS: Target date: 09/11/2024   Patient will report at least 75% improvement in symptoms for improved quality of life. Baseline:  Goal status: MET (65% 06/19/24)  2.  Patient will improve UEFS score by at least 25 points in order to indicate improved tolerance to activity. Baseline:  Goal status: MET 07/31/24  3.  Patient will demonstrate at least PROM/AAROM/AROM to: Elevation - 145-160; ER - 40-50 ; functional IR to L1 in L shoulder for improved ability lift overhead. Baseline:  Goal status: IN PROGRESS - partially met 07/31/24  4.  Patient will be able to return to all activities unrestricted for improved ability to perform yard work functions and participate with family.  Baseline:  Goal status: IN PROGRESS 07/31/24  5.  Patient will demonstrate grade of 5/5 MMT grade in all tested musculature as evidence of improved strength to assist with lifting at home. Baseline:  Goal status: IN PROGRESS 07/31/24   PLAN:  PT FREQUENCY: 1-2x/week  PT DURATION: 12 weeks  PLANNED INTERVENTIONS:[id number]- PT Re-evaluation, 97110-Therapeutic exercises, 97530- Therapeutic activity, [id number]- Neuromuscular re-education, 97535- Self Care, 02859- Manual therapy, [id number]-  Gait training, 02239- Orthotic Fit/training, 04007- Canalith repositioning, 02886- Aquatic Therapy, [id number]- Splinting, 02402- Wound care (first 20 sq cm), 97598- Wound care (each additional 20 sq cm)Patient/Family education, Balance training, Stair training, Taping, Dry Needling, Joint mobilization, Joint manipulation, Spinal manipulation, Spinal mobilization, Scar mobilization, and DME instructions.  PLAN FOR NEXT SESSION: progress per protocol, progress PREs as appropriate/tolerated - WB for  proximation.   Prentice RAMAN Serenity Batley, PT, DPT 07/31/2024, 9:31 AM

## 2024-08-03 ENCOUNTER — Ambulatory Visit (HOSPITAL_BASED_OUTPATIENT_CLINIC_OR_DEPARTMENT_OTHER): Admitting: Physical Therapy

## 2024-08-03 ENCOUNTER — Encounter (HOSPITAL_BASED_OUTPATIENT_CLINIC_OR_DEPARTMENT_OTHER): Payer: Self-pay | Admitting: Physical Therapy

## 2024-08-03 DIAGNOSIS — M25612 Stiffness of left shoulder, not elsewhere classified: Secondary | ICD-10-CM

## 2024-08-03 DIAGNOSIS — M25512 Pain in left shoulder: Secondary | ICD-10-CM

## 2024-08-03 DIAGNOSIS — M6281 Muscle weakness (generalized): Secondary | ICD-10-CM

## 2024-08-03 DIAGNOSIS — R29898 Other symptoms and signs involving the musculoskeletal system: Secondary | ICD-10-CM

## 2024-08-03 NOTE — Therapy (Signed)
 " OUTPATIENT PHYSICAL THERAPY SHOULDER TREATMENT NOTE   Patient Name: Greg Rogers. MRN: [id number] DOB:11/14/1940, 83 y.o., male Today's Date: 08/03/2024    END OF SESSION:  PT End of Session - 08/03/24 1151     Visit Number 29    Number of Visits 48    Date for Recertification  09/11/24    Authorization Type MCR    Progress Note Due on Visit 38    PT Start Time 1148    PT Stop Time 1228    PT Time Calculation (min) 40 min    Activity Tolerance Patient tolerated treatment well    Behavior During Therapy WFL for tasks assessed/performed          Past Medical History:  Diagnosis Date   Abnormal GGT test    With otherwise normal work up.   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)    Persistent disorder of initiating or maintaining sleep    Pes planus    Rosacea    Snoring    Past Surgical History:  Procedure Laterality Date   APPENDECTOMY  1949   at age 46   CERVICAL FUSION  03/2016   C 3&4   Cyst removed from breast  1960   FOOT SURGERY  01/2009   Left   HERNIA REPAIR  1956   at age 40 (single)   HERNIA REPAIR  1960   (double)   Left 2nd toe removed  03/2017   REVERSE SHOULDER ARTHROPLASTY Left 03/27/2024   Procedure: ARTHROPLASTY, SHOULDER, TOTAL, REVERSE;  Surgeon: Genelle Standing, MD;  Location: [facility];  Service: Orthopedics;  Laterality: Left;   SPINE SURGERY     TONSILLECTOMY AND ADENOIDECTOMY  ~ 1950   VASECTOMY  ~ 1984   Patient Active Problem List   Diagnosis Date Noted   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 03/27/2024   Leg pain 03/21/2024   Shoulder pain 12/07/2023   Change in stool 11/02/2023   Nasal lesion 06/02/2023   Occipital neuralgia 10/12/2020   Wound of right leg, initial encounter  07/03/2019   Healthcare maintenance 03/04/2017   Foot pain 07/22/2016   Cervical stenosis of spine 01/06/2016   thyroid  nodule-benign path on bx 01/2016 01/06/2016   Radicular pain in right arm 12/22/2015   Radicular leg pain 12/22/2015   Back pain 07/06/2015   Constipation 07/06/2015   FH: CAD (coronary artery disease) 05/23/2015   Family history of ischemic heart disease and other diseases of the circulatory system 05/23/2015   Advance care planning 11/26/2014   Pain in joint, shoulder region 11/26/2014   Medicare annual wellness visit, subsequent 09/27/2012   GERD (gastroesophageal reflux disease) 07/05/2012   Irritability 03/29/2011   Advance directive on file 01/09/2011   Pain in joint, other site 01/04/2011   Arthropathy 09/02/2010   NEPHROLITHIASIS, HX OF 09/02/2010   Diabetes mellitus without complication (HCC) 09/01/2010   HLD (hyperlipidemia) 10/24/2009   SNORING 10/24/2009   FOOT PAIN, BILATERAL 10/06/2009   Pes planus 10/06/2009   Acquired flat foot 10/06/2009    PCP: Cleatus Arlyss RAMAN, MD  REFERRING PROVIDER: Genelle Standing, MD  REFERRING DIAG: S46.012A (ICD-10-CM) - Traumatic complete tear of  left rotator cuff, initial encounter;  1. Left shoulder reverse shoulder arthroplasty 2. Left shoulder biceps tenodesis  THERAPY DIAG:  Acute pain of left shoulder  Stiffness of left shoulder, not elsewhere classified  Muscle weakness (generalized)  Other symptoms and signs involving the musculoskeletal system  Rationale for Evaluation and Treatment: Rehabilitation  ONSET DATE: DOS 03/27/24  SUBJECTIVE:                                                                                                                                                                                      SUBJECTIVE STATEMENT: Patient states he feels limited in strength still. Doing well.   EVAL: Patient states shoulder has been doing good. Using sling. Was very active prior to  injury/surgery with gardening/yard work Hand dominance: Right  PERTINENT HISTORY: DM, HLD, chronic shoulder pain with fall in March while exiting ski lift with RC tear  PAIN:  Are you having pain? Yes 0/10 Location: anterior Lt shoulder/upper arm Description: dull, uncomfortable Worsened by: raising arm to front Relieved by:  massage, icy hot      PRECAUTIONS: Other: L reverse TSA   RED FLAGS: None   WEIGHT BEARING RESTRICTIONS: NWB LUE at eval  FALLS:  Has patient fallen in last 6 months? Yes. Number of falls 1  PLOF: Independent  PATIENT GOALS:regain use of the arm  OBJECTIVE: (objective measures from initial evaluation unless otherwise dated)  OBSERVATION: using sling, waterproof dressing intact  PATIENT SURVEYS:  UEFS  Extreme difficulty/unable (0), Quite a bit of difficulty (1), Moderate difficulty (2), Little difficulty (3), No difficulty (4) Survey date:  03/30/24 06/19/24 07/31/24  Any of your usual work, household or school activities 0  3  2. Your usual hobbies, recreational/sport activities 1  2   3. Lifting a bag of groceries to waist level 2  3   4. Lifting a bag of groceries above your head 0  1  5. Grooming your hair 4  4  6. Pushing up on your hands (I.e. from bathtub or chair) 0  3  7. Preparing food (I.e. peeling/cutting) 1  4  8. Driving  3  4  9. Vacuuming, sweeping, or raking 4  4  10. Dressing  3  4  11. Doing up buttons 2  3  12. Using tools/appliances 1  4  13. Opening doors 0  3  14. Cleaning  3  4  15. Tying or lacing shoes 2  4  16. Sleeping  3  4  17. Laundering clothes (I.e. washing, ironing, folding) 3  4  18. Opening a jar 3  3  19. Throwing a ball 1  3  20. Carrying a small suitcase with your affected limb.  1  4  Score total:  35/80 58/80 68/80      05/08/24- UEFS 15/80 via online version, later noted questions were different than one in epic   COGNITION: Overall cognitive status: Within functional limits for tasks  assessed     SENSATION: WFL  POSTURE: rounded shoulders and forward head   UPPER EXTREMITY ROM: AROM RUE WFL  Passive ROM Left eval L 04/06/24 L 04/13/24 L 04/20/24 L 04/24/24 L 04/27/24 L 9/17 L  9/19 L 9/26 L 06/01/24 L 06/05/24 L 06/07/24 L 06/19/24 L 07/27/24 L 07/31/24  Shoulder flexion 100 PROM 110 AAROM 110* PROM, 120* AAROM supine  120-130* PROM (bit guarded today) 130* PROM and AROM  114 AROM 140 AAROM 145 AAROM 138 AAROM Improves to 149 AAROM 139 AAROM 148 AAROM improves to 153 AAROM 144* AAROM  130 degrees on shoulder flexion ladder  120 before manual, 135 after in supine, 140 on shoulder flexion ladder  135 deg before manual 140  130 AAROM against gravity  149 AAROM 145 AAROM AROM 90 - limited by strength  Shoulder extension                 Shoulder abduction                 Shoulder adduction                 Shoulder internal rotation         Mid glute. L5 at best with IR stretch     L5  L4  Shoulder external rotation 20 25* next to body PROM setaed (very guarded), 30* AAROM seated  30* PROM arm next to body  30* PROM arm next to body      30* PROM/AAROM supine arm next to body but limited by anterior shoulder pain     60 deg   C7  Elbow flexion                 Elbow extension                 Wrist flexion                 Wrist extension                 Wrist ulnar deviation                 Wrist radial deviation                 Wrist pronation                 Wrist supination                 (Blank rows = not tested) *=pain/symptoms  UPPER EXTREMITY MMT: NT due to post op status  MMT Right eval Left eval Left 05/11/24 Left 06/19/24 Right 07/31/24 Left 07/31/24  Shoulder flexion   3 UT compensations  3 4 3   Shoulder extension        Shoulder abduction   3 UT compensations  3 4 3   Shoulder adduction        Shoulder internal rotation    4- 5 4-  Shoulder external rotation    4- 4 4-  Middle trapezius        Lower trapezius        Elbow flexion  Elbow  extension        Wrist flexion        Wrist extension        Wrist ulnar deviation        Wrist radial deviation        Wrist pronation        Wrist supination        Grip strength (lbs)        (Blank rows = not tested) *=pain/symptoms    PALPATION:  LUE gross edema                                                                                                       TODAY'S TREATMENT:                                                                                                                                         DATE:  08/03/24 Manual: STM to L deltoid pre and post dry needling for trigger point identification and muscular relaxation. Trigger Point Dry Needling  Initial Treatment: Pt instructed on Dry Needling rational, procedures, and possible side effects. Pt instructed to expect mild to moderate muscle soreness later in the day and/or into the next day.  Pt instructed in methods to reduce muscle soreness. Pt instructed to continue prescribed HEP. Patient was educated on signs and symptoms of infection and other risk factors and advised to seek medical attention should they occur.  Patient verbalized understanding of these instructions and education.   Patient Verbal Consent Given: Yes Education Handout Provided: Yes Muscles Treated: L deltoid Electrical Stimulation Performed: No Treatment Response/Outcome: decrease in tissue tension/ twitch response  Supine AAROM flexion 2 x 10 Shoulder flexion wall slides with lift off 1 x 8 Shoulder press with PT assist 1 x 10  07/31/24 Pulley 2 minutes for warm up and ROM Reassessment Manual: STM to deltoid Supine bilateral ER RTB 2 x 10 Supine horizontal abduction RTB  2 x 10 Supine shoulder PNF D2 1 x 8 - pain in deltoid trigger poing Supine Shoulder flexion/PNF D2 RTB 2 x 10  Standing shoulder extension RTB 2 x 10   07/27/24 Manual: STM to deltoid, teres minor/major, lats; pin and stretch teres, lats into flexion, pin  and stretch pec, Grade II-III Distal clavicle mobs Supine bilateral ER RTB 2 x 10 Supine horizontal abduction RTB  2 x 10 Supine shoulder PNF D2 1 x 8 - pain in deltoid trigger poing Supine Shoulder  flexion/PNF D2 RTB to about 90 2 x 10  Shoulder flexion stretch at rail   07/25/24 UE bike L4 x4 min fwd/backward  Manual: STM to deltoid, teres minor/major, lats; pin and stretch teres, lats into flexion AAROM flexion with RTB in hands 1 x 10  Manual: STM to L deltoid pre and post dry needling for trigger point identification and muscular relaxation. Trigger Point Dry Needling  Initial Treatment: Pt instructed on Dry Needling rational, procedures, and possible side effects. Pt instructed to expect mild to moderate muscle soreness later in the day and/or into the next day.  Pt instructed in methods to reduce muscle soreness. Pt instructed to continue prescribed HEP. Patient was educated on signs and symptoms of infection and other risk factors and advised to seek medical attention should they occur.  Patient verbalized understanding of these instructions and education.   Patient Verbal Consent Given: Yes Education Handout Provided: Yes Muscles Treated: L deltoid Electrical Stimulation Performed: No Treatment Response/Outcome: decrease in tissue tension    07/20/24 UE bike L4 x4 min fwd/backward  STM to deltoid, biceps  Grade III posterior and inferior GHJ glides  Shoulder PROM Stargazer stretch x5  SL ER x10  Standing shoulder IR x5 RTB Shoulder flexion ladder x10   07/18/24 STM to deltoid, biceps  Grade III posterior and inferior GHJ glides  Shoulder PROM D2 shoulder flexion in supine 3x10 Stargazer stretch in supine x10 Shoulder flexion with perpendicular resistance RTB 2x10    07/10/24 -UBE L1: 30 sec forward/30 sec backward x , towel in small of back for improved posture - quadruped position (for shoulder approximation)- rocking R/L; alternating forward arm lifts;  alternating side arm lifts  - counter push up position (for shoulder approximation) - alternating rows, alternating opposite shoulder taps - finger ladder into Lt shoulder flexion  - in front of mirror: bil shoulder flexion to 80deg and steering wheel motion -in front of mirror: bil bicep curl to overhead press with ball -> 2# wt x 10  - reverse mini wall push up x 10  - Stargazer stretch 60 sec in supine (back of Lt palm on forehead) - manual therapy:  STM to Lt pec major, biceps brachii, ant deltoid, latissimus - Lt shoulder flexion 60-110 in supine with 1#, cues for good scapular positioning   PATIENT EDUCATION:  Education details:  HEP, relevant anatomy/biomechanics, protocol/precautions. 07/31/24: reassessment findings, progress made, POC, HEP Person educated: Patient Education method: Explanation, Demonstration,  Education comprehension: verbalized understanding, returned demonstration, verbal cues required, and tactile cues required  HOME EXERCISE PROGRAM:  Access Code: [email or web address] URL: [email or web address] Date: 04/13/2024 Prepared by: Josette Rough  Exercises - Wrist AROM Flexion Extension  - 3 x daily - 7 x weekly - 2 sets - 10 reps - Seated Elbow Flexion and Extension AROM (Mirrored)  - 3 x daily - 7 x weekly - 2 sets - 10 reps - Circular Shoulder Pendulum with Table Support (Mirrored)  - 3 x daily - 7 x weekly - 2 sets - 10 reps - Seated Scapular Retraction  - 3 x daily - 7 x weekly - 2 sets - 10 reps - Seated Shoulder Flexion Towel Slide at Table Top (Mirrored)  - 3 x daily - 7 x weekly - 10 reps - 10 second hold - Supine Shoulder Flexion AAROM with Hands Clasped  - 3 x daily - 7 x weekly - 3 sets - 10 reps - Supine Shoulder External Rotation with Dowel  - 3 x daily -  7 x weekly - 2 sets - 10 reps - Standing Isometric Shoulder Flexion with Doorway - Arm Bent  - 1 x daily - 7 x weekly - 2 sets - 10 reps - 3 seconds  hold - Standing Isometric Shoulder  Abduction with Doorway - Arm Bent  - 1 x daily - 7 x weekly - 2 sets - 10 reps - 3 seconds  hold - Standing Isometric Shoulder Extension with Doorway - Arm Bent  - 1 x daily - 7 x weekly - 2 sets - 10 reps - 3 seconds  hold - Standing Isometric Shoulder External Rotation with Doorway  - 1 x daily - 7 x weekly - 2 sets - 10 reps - 3 seconds  hold   ASSESSMENT:  CLINICAL IMPRESSION: Continued hyperactive and tender deltoid with trigger points, performed STM and DN with decrease in tissue tension and twitch response. Continued with strengthening and mobility exercises, Good AAROM and improving AROM. Patient will continue to benefit from skilled physical therapy in order to improve function and reduce impairment.     EVAL: Patient a 83 y.o. y.o. male who was seen today for physical therapy evaluation and treatment for L reverse TSA DOS 03/27/24. Patient presents with pain limited deficits in L shoulder strength, ROM, endurance, activity tolerance, and functional mobility with ADL. Patient is having to modify and restrict ADL as indicated by outcome measure score as well as subjective information and objective measures which is affecting overall participation. Patient will benefit from skilled physical therapy in order to improve function and reduce impairment.  OBJECTIVE IMPAIRMENTS: decreased activity tolerance, decreased endurance, decreased mobility, decreased ROM, decreased strength, increased muscle spasms, impaired flexibility, impaired UE functional use, improper body mechanics, and pain  ACTIVITY LIMITATIONS:  carrying, lifting, bending, sleeping, bed mobility, bathing, dressing, reach over head, hygiene/grooming, and caring for others  PARTICIPATION LIMITATIONS:  meal prep, cleaning, laundry, driving, shopping, community activity, and yard work  PERSONAL FACTORS: Age, Time since onset of injury/illness/exacerbation, and 1-2 comorbidities: DM, HLD are also affecting patient's functional  outcome.   REHAB POTENTIAL: Good  CLINICAL DECISION MAKING: Evolving/moderate complexity  EVALUATION COMPLEXITY: Moderate  GOALS: Goals reviewed with patient? Yes  SHORT TERM GOALS: Target date: 05/11/2024    Patient will be independent with HEP in order to improve functional outcomes. Baseline: Goal status: MET 06/19/24  2.  Patient will report at least 25% improvement in symptoms for improved quality of life. Baseline:  Goal status: MET 06/19/24  3.  Patient will demonstrate Elevation to 130 deg and ER to 30 deg - passive, active assisted or active  Baseline:  Goal status: MET 04/20/24  4.  Patient will be out of sling per surgeon approval for improved functional use of L shoulder. Baseline:  Goal status: MET 04/13/24   LONG TERM GOALS: Target date: 09/11/2024   Patient will report at least 75% improvement in symptoms for improved quality of life. Baseline:  Goal status: MET (65% 06/19/24)  2.  Patient will improve UEFS score by at least 25 points in order to indicate improved tolerance to activity. Baseline:  Goal status: MET 07/31/24  3.  Patient will demonstrate at least PROM/AAROM/AROM to: Elevation - 145-160; ER - 40-50 ; functional IR to L1 in L shoulder for improved ability lift overhead. Baseline:  Goal status: IN PROGRESS - partially met 07/31/24  4.  Patient will be able to return to all activities unrestricted for improved ability to perform yard work functions and participate with  family.  Baseline:  Goal status: IN PROGRESS 07/31/24  5.  Patient will demonstrate grade of 5/5 MMT grade in all tested musculature as evidence of improved strength to assist with lifting at home. Baseline:  Goal status: IN PROGRESS 07/31/24   PLAN:  PT FREQUENCY: 1-2x/week  PT DURATION: 12 weeks  PLANNED INTERVENTIONS:[id number]- PT Re-evaluation, 97110-Therapeutic exercises, 97530- Therapeutic activity, 97112- Neuromuscular re-education, 97535- Self Care, [id number]- Manual  therapy, [phone]- Gait training, [phone]- Orthotic Fit/training, [phone]- Canalith repositioning, [id number]- Aquatic Therapy, [phone]- Splinting, [phone]- Wound care (first 20 sq cm), 97598- Wound care (each additional 20 sq cm)Patient/Family education, Balance training, Stair training, Taping, Dry Needling, Joint mobilization, Joint manipulation, Spinal manipulation, Spinal mobilization, Scar mobilization, and DME instructions.  PLAN FOR NEXT SESSION: progress per protocol, progress PREs as appropriate/tolerated - WB for proximation.   Prentice RAMAN Shatyra Becka, PT, DPT 08/03/2024, 11:52 AM    "

## 2024-08-07 ENCOUNTER — Encounter (HOSPITAL_BASED_OUTPATIENT_CLINIC_OR_DEPARTMENT_OTHER): Payer: Self-pay | Admitting: Physical Therapy

## 2024-08-07 ENCOUNTER — Ambulatory Visit (HOSPITAL_BASED_OUTPATIENT_CLINIC_OR_DEPARTMENT_OTHER): Admitting: Physical Therapy

## 2024-08-07 DIAGNOSIS — M25512 Pain in left shoulder: Secondary | ICD-10-CM | POA: Diagnosis not present

## 2024-08-07 DIAGNOSIS — R29898 Other symptoms and signs involving the musculoskeletal system: Secondary | ICD-10-CM

## 2024-08-07 DIAGNOSIS — M6281 Muscle weakness (generalized): Secondary | ICD-10-CM

## 2024-08-07 DIAGNOSIS — M25612 Stiffness of left shoulder, not elsewhere classified: Secondary | ICD-10-CM

## 2024-08-07 NOTE — Therapy (Signed)
 " OUTPATIENT PHYSICAL THERAPY SHOULDER TREATMENT NOTE   Patient Name: Greg Rogers. MRN: [id number] DOB:[birth year], 83 y.o., male Today's Date: 08/07/2024    END OF SESSION:  PT End of Session - 08/07/24 0849     Visit Number 30    Number of Visits 48    Date for Recertification  09/11/24    Authorization Type MCR    Progress Note Due on Visit 38    PT Start Time 0847    PT Stop Time 0932    PT Time Calculation (min) 45 min    Activity Tolerance Patient tolerated treatment well    Behavior During Therapy WFL for tasks assessed/performed          Past Medical History:  Diagnosis Date   Abnormal GGT test    With otherwise normal work up.   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)    Persistent disorder of initiating or maintaining sleep    Pes planus    Rosacea    Snoring    Past Surgical History:  Procedure Laterality Date   APPENDECTOMY  1949   at age 64   CERVICAL FUSION  03/2016   C 3&4   Cyst removed from breast  1960   FOOT SURGERY  01/2009   Left   HERNIA REPAIR  1956   at age 30 (single)   HERNIA REPAIR  1960   (double)   Left 2nd toe removed  03/2017   REVERSE SHOULDER ARTHROPLASTY Left 03/27/2024   Procedure: ARTHROPLASTY, SHOULDER, TOTAL, REVERSE;  Surgeon: Genelle Standing, MD;  Location: [facility];  Service: Orthopedics;  Laterality: Left;   SPINE SURGERY     TONSILLECTOMY AND ADENOIDECTOMY  ~ 1950   VASECTOMY  ~ 1984   Patient Active Problem List   Diagnosis Date Noted   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 03/27/2024   Leg pain 03/21/2024   Shoulder pain 12/07/2023   Change in stool 11/02/2023   Nasal lesion 06/02/2023   Occipital neuralgia 10/12/2020   Wound of right leg, initial encounter  07/03/2019   Healthcare maintenance 03/04/2017   Foot pain 07/22/2016   Cervical stenosis of spine 01/06/2016   thyroid  nodule-benign path on bx 01/2016 01/06/2016   Radicular pain in right arm 12/22/2015   Radicular leg pain 12/22/2015   Back pain 07/06/2015   Constipation 07/06/2015   FH: CAD (coronary artery disease) 05/23/2015   Family history of ischemic heart disease and other diseases of the circulatory system 05/23/2015   Advance care planning 11/26/2014   Pain in joint, shoulder region 11/26/2014   Medicare annual wellness visit, subsequent 09/27/2012   GERD (gastroesophageal reflux disease) 07/05/2012   Irritability 03/29/2011   Advance directive on file 01/09/2011   Pain in joint, other site 01/04/2011   Arthropathy 09/02/2010   NEPHROLITHIASIS, HX OF 09/02/2010   Diabetes mellitus without complication (HCC) 09/01/2010   HLD (hyperlipidemia) 10/24/2009   SNORING 10/24/2009   FOOT PAIN, BILATERAL 10/06/2009   Pes planus 10/06/2009   Acquired flat foot 10/06/2009    PCP: Cleatus Arlyss RAMAN, MD  REFERRING PROVIDER: Genelle Standing, MD  REFERRING DIAG: S46.012A (ICD-10-CM) - Traumatic complete tear of  left rotator cuff, initial encounter;  1. Left shoulder reverse shoulder arthroplasty 2. Left shoulder biceps tenodesis  THERAPY DIAG:  Acute pain of left shoulder  Stiffness of left shoulder, not elsewhere classified  Muscle weakness (generalized)  Other symptoms and signs involving the musculoskeletal system  Rationale for Evaluation and Treatment: Rehabilitation  ONSET DATE: DOS 03/27/24  SUBJECTIVE:                                                                                                                                                                                      SUBJECTIVE STATEMENT: Patient states HEP going well, no issues from DN. Can't tell that it helped all that much. Improving wall slide at home.   EVAL: Patient states shoulder has been  doing good. Using sling. Was very active prior to injury/surgery with gardening/yard work Hand dominance: Right  PERTINENT HISTORY: DM, HLD, chronic shoulder pain with fall in March while exiting ski lift with RC tear  PAIN:  Are you having pain? Yes 0/10 Location: anterior Lt shoulder/upper arm Description: dull, uncomfortable Worsened by: raising arm to front Relieved by:  massage, icy hot      PRECAUTIONS: Other: L reverse TSA   RED FLAGS: None   WEIGHT BEARING RESTRICTIONS: NWB LUE at eval  FALLS:  Has patient fallen in last 6 months? Yes. Number of falls 1  PLOF: Independent  PATIENT GOALS:regain use of the arm  OBJECTIVE: (objective measures from initial evaluation unless otherwise dated)  OBSERVATION: using sling, waterproof dressing intact  PATIENT SURVEYS:  UEFS  Extreme difficulty/unable (0), Quite a bit of difficulty (1), Moderate difficulty (2), Little difficulty (3), No difficulty (4) Survey date:  03/30/24 06/19/24 07/31/24  Any of your usual work, household or school activities 0  3  2. Your usual hobbies, recreational/sport activities 1  2   3. Lifting a bag of groceries to waist level 2  3   4. Lifting a bag of groceries above your head 0  1  5. Grooming your hair 4  4  6. Pushing up on your hands (I.e. from bathtub or chair) 0  3  7. Preparing food (I.e. peeling/cutting) 1  4  8. Driving  3  4  9. Vacuuming, sweeping, or raking 4  4  10. Dressing  3  4  11. Doing up buttons 2  3  12. Using tools/appliances 1  4  13. Opening doors 0  3  14. Cleaning  3  4  15. Tying or lacing shoes 2  4  16. Sleeping  3  4  17. Laundering clothes (I.e. washing, ironing, folding) 3  4  18. Opening  a jar 3  3  19. Throwing a ball 1  3  20. Carrying a small suitcase with your affected limb.  1  4  Score total:  35/80 58/80 68/80      05/08/24- UEFS 15/80 via online version, later noted questions were different than one in epic   COGNITION: Overall cognitive  status: Within functional limits for tasks assessed     SENSATION: WFL  POSTURE: rounded shoulders and forward head   UPPER EXTREMITY ROM: AROM RUE WFL  Passive ROM Left eval L 04/06/24 L 04/13/24 L 04/20/24 L 04/24/24 L 04/27/24 L 9/17 L  9/19 L 9/26 L 06/01/24 L 06/05/24 L 06/07/24 L 06/19/24 L 07/27/24 L 07/31/24 L 08/07/24  Shoulder flexion 100 PROM 110 AAROM 110* PROM, 120* AAROM supine  120-130* PROM (bit guarded today) 130* PROM and AROM  114 AROM 140 AAROM 145 AAROM 138 AAROM Improves to 149 AAROM 139 AAROM 148 AAROM improves to 153 AAROM 144* AAROM  130 degrees on shoulder flexion ladder  120 before manual, 135 after in supine, 140 on shoulder flexion ladder  135 deg before manual 140  130 AAROM against gravity  149 AAROM 145 AAROM AROM 90 - limited by strength 140 AAROM Improves to 146 AAROM with manual   Shoulder extension                  Shoulder abduction                  Shoulder adduction                  Shoulder internal rotation         Mid glute. L5 at best with IR stretch     L5  L4   Shoulder external rotation 20 25* next to body PROM setaed (very guarded), 30* AAROM seated  30* PROM arm next to body  30* PROM arm next to body      30* PROM/AAROM supine arm next to body but limited by anterior shoulder pain     60 deg   C7   Elbow flexion                  Elbow extension                  Wrist flexion                  Wrist extension                  Wrist ulnar deviation                  Wrist radial deviation                  Wrist pronation                  Wrist supination                  (Blank rows = not tested) *=pain/symptoms  UPPER EXTREMITY MMT: NT due to post op status  MMT Right eval Left eval Left 05/11/24 Left 06/19/24 Right 07/31/24 Left 07/31/24  Shoulder flexion   3 UT compensations  3 4 3   Shoulder extension        Shoulder abduction   3 UT compensations  3 4 3   Shoulder adduction        Shoulder internal rotation    4- 5 4-  Shoulder  external rotation    4- 4 4-  Middle trapezius        Lower trapezius        Elbow flexion        Elbow extension        Wrist flexion        Wrist extension        Wrist ulnar deviation        Wrist radial deviation        Wrist pronation        Wrist supination        Grip strength (lbs)        (Blank rows = not tested) *=pain/symptoms    PALPATION:  LUE gross edema                                                                                                       TODAY'S TREATMENT:                                                                                                                                         DATE:  08/07/24 Manual: STM to deltoid, teres minor/major, lats, UT; pin and stretch teres, lats into flexion, pin and stretch pec Shoulder flexion wall slides with lift off 1 x 8 Standing ER stretch at door 3 x 20 second holds Functional IR stretch with strap 5 x 10 second holds Shoulder flexion with loop band at wall 1 x 10 Bend over shoulder flexion 1 x 10 Supine shoulder Flexion D2 RTB 1 x 5 - difficult today  08/03/24 Manual: STM to L deltoid pre and post dry needling for trigger point identification and muscular relaxation. Trigger Point Dry Needling  Initial Treatment: Pt instructed on Dry Needling rational, procedures, and possible side effects. Pt instructed to expect mild to moderate muscle soreness later in the day and/or into the next day.  Pt instructed in methods to reduce muscle soreness. Pt instructed to continue prescribed HEP. Patient was educated on signs and symptoms of infection and other risk factors and advised to seek medical attention should they occur.  Patient verbalized understanding of these instructions and education.   Patient Verbal Consent Given: Yes Education Handout Provided: Yes Muscles Treated: L deltoid Electrical Stimulation Performed: No Treatment Response/Outcome: decrease in tissue tension/ twitch  response  Supine AAROM flexion 2 x 10 Shoulder flexion wall slides with lift off 1 x 8 Shoulder press with PT assist 1 x  10  07/31/24 Pulley 2 minutes for warm up and ROM Reassessment Manual: STM to deltoid Supine bilateral ER RTB 2 x 10 Supine horizontal abduction RTB  2 x 10 Supine shoulder PNF D2 1 x 8 - pain in deltoid trigger poing Supine Shoulder flexion/PNF D2 RTB 2 x 10  Standing shoulder extension RTB 2 x 10   07/27/24 Manual: STM to deltoid, teres minor/major, lats; pin and stretch teres, lats into flexion, pin and stretch pec, Grade II-III Distal clavicle mobs Supine bilateral ER RTB 2 x 10 Supine horizontal abduction RTB  2 x 10 Supine shoulder PNF D2 1 x 8 - pain in deltoid trigger poing Supine Shoulder flexion/PNF D2 RTB to about 90 2 x 10  Shoulder flexion stretch at rail   07/25/24 UE bike L4 x4 min fwd/backward  Manual: STM to deltoid, teres minor/major, lats; pin and stretch teres, lats into flexion AAROM flexion with RTB in hands 1 x 10  Manual: STM to L deltoid pre and post dry needling for trigger point identification and muscular relaxation. Trigger Point Dry Needling  Initial Treatment: Pt instructed on Dry Needling rational, procedures, and possible side effects. Pt instructed to expect mild to moderate muscle soreness later in the day and/or into the next day.  Pt instructed in methods to reduce muscle soreness. Pt instructed to continue prescribed HEP. Patient was educated on signs and symptoms of infection and other risk factors and advised to seek medical attention should they occur.  Patient verbalized understanding of these instructions and education.   Patient Verbal Consent Given: Yes Education Handout Provided: Yes Muscles Treated: L deltoid Electrical Stimulation Performed: No Treatment Response/Outcome: decrease in tissue tension    07/20/24 UE bike L4 x4 min fwd/backward  STM to deltoid, biceps  Grade III posterior and inferior GHJ  glides  Shoulder PROM Stargazer stretch x5  SL ER x10  Standing shoulder IR x5 RTB Shoulder flexion ladder x10   07/18/24 STM to deltoid, biceps  Grade III posterior and inferior GHJ glides  Shoulder PROM D2 shoulder flexion in supine 3x10 Stargazer stretch in supine x10 Shoulder flexion with perpendicular resistance RTB 2x10    07/10/24 -UBE L1: 30 sec forward/30 sec backward x , towel in small of back for improved posture - quadruped position (for shoulder approximation)- rocking R/L; alternating forward arm lifts; alternating side arm lifts  - counter push up position (for shoulder approximation) - alternating rows, alternating opposite shoulder taps - finger ladder into Lt shoulder flexion  - in front of mirror: bil shoulder flexion to 80deg and steering wheel motion -in front of mirror: bil bicep curl to overhead press with ball -> 2# wt x 10  - reverse mini wall push up x 10  - Stargazer stretch 60 sec in supine (back of Lt palm on forehead) - manual therapy:  STM to Lt pec major, biceps brachii, ant deltoid, latissimus - Lt shoulder flexion 60-110 in supine with 1#, cues for good scapular positioning   PATIENT EDUCATION:  Education details:  HEP, relevant anatomy/biomechanics, protocol/precautions. 07/31/24: reassessment findings, progress made, POC, HEP Person educated: Patient Education method: Explanation, Demonstration,  Education comprehension: verbalized understanding, returned demonstration, verbal cues required, and tactile cues required  HOME EXERCISE PROGRAM:  Access Code: [email or web address] URL: [email or web address] Date: 04/13/2024 Prepared by: Josette Rough  Exercises - Wrist AROM Flexion Extension  - 3 x daily - 7 x weekly - 2 sets - 10 reps - Seated Elbow Flexion and Extension AROM (Mirrored)  -  3 x daily - 7 x weekly - 2 sets - 10 reps - Circular Shoulder Pendulum with Table Support (Mirrored)  - 3 x daily - 7 x weekly - 2 sets - 10 reps -  Seated Scapular Retraction  - 3 x daily - 7 x weekly - 2 sets - 10 reps - Seated Shoulder Flexion Towel Slide at Table Top (Mirrored)  - 3 x daily - 7 x weekly - 10 reps - 10 second hold - Supine Shoulder Flexion AAROM with Hands Clasped  - 3 x daily - 7 x weekly - 3 sets - 10 reps - Supine Shoulder External Rotation with Dowel  - 3 x daily - 7 x weekly - 2 sets - 10 reps - Standing Isometric Shoulder Flexion with Doorway - Arm Bent  - 1 x daily - 7 x weekly - 2 sets - 10 reps - 3 seconds  hold - Standing Isometric Shoulder Abduction with Doorway - Arm Bent  - 1 x daily - 7 x weekly - 2 sets - 10 reps - 3 seconds  hold - Standing Isometric Shoulder Extension with Doorway - Arm Bent  - 1 x daily - 7 x weekly - 2 sets - 10 reps - 3 seconds  hold - Standing Isometric Shoulder External Rotation with Doorway  - 1 x daily - 7 x weekly - 2 sets - 10 reps - 3 seconds  hold   ASSESSMENT:  CLINICAL IMPRESSION: AROM limited to about 90 degrees due to weakness but he is able to go overhead with assist. Continued hyperactive and tender deltoid with trigger points, performed STM and pin and stretch with decrease in tissue tension and mild improvement in mobility Continued with strengthening and mobility exercises with cueing provided throughout for reduced shoulder hike although it does worsen with fatigue. Discussed performing HEP earlier in the day to focus on mechanics rather than possible compensation with fatigue. Patient will continue to benefit from skilled physical therapy in order to improve function and reduce impairment.     EVAL: Patient a 83 y.o. y.o. male who was seen today for physical therapy evaluation and treatment for L reverse TSA DOS 03/27/24. Patient presents with pain limited deficits in L shoulder strength, ROM, endurance, activity tolerance, and functional mobility with ADL. Patient is having to modify and restrict ADL as indicated by outcome measure score as well as subjective information  and objective measures which is affecting overall participation. Patient will benefit from skilled physical therapy in order to improve function and reduce impairment.  OBJECTIVE IMPAIRMENTS: decreased activity tolerance, decreased endurance, decreased mobility, decreased ROM, decreased strength, increased muscle spasms, impaired flexibility, impaired UE functional use, improper body mechanics, and pain  ACTIVITY LIMITATIONS:  carrying, lifting, bending, sleeping, bed mobility, bathing, dressing, reach over head, hygiene/grooming, and caring for others  PARTICIPATION LIMITATIONS:  meal prep, cleaning, laundry, driving, shopping, community activity, and yard work  PERSONAL FACTORS: Age, Time since onset of injury/illness/exacerbation, and 1-2 comorbidities: DM, HLD are also affecting patient's functional outcome.   REHAB POTENTIAL: Good  CLINICAL DECISION MAKING: Evolving/moderate complexity  EVALUATION COMPLEXITY: Moderate  GOALS: Goals reviewed with patient? Yes  SHORT TERM GOALS: Target date: 05/11/2024    Patient will be independent with HEP in order to improve functional outcomes. Baseline: Goal status: MET 06/19/24  2.  Patient will report at least 25% improvement in symptoms for improved quality of life. Baseline:  Goal status: MET 06/19/24  3.  Patient will demonstrate Elevation to 130  deg and ER to 30 deg - passive, active assisted or active  Baseline:  Goal status: MET 04/20/24  4.  Patient will be out of sling per surgeon approval for improved functional use of L shoulder. Baseline:  Goal status: MET 04/13/24   LONG TERM GOALS: Target date: 09/11/2024   Patient will report at least 75% improvement in symptoms for improved quality of life. Baseline:  Goal status: MET (65% 06/19/24)  2.  Patient will improve UEFS score by at least 25 points in order to indicate improved tolerance to activity. Baseline:  Goal status: MET 07/31/24  3.  Patient will demonstrate at least  PROM/AAROM/AROM to: Elevation - 145-160; ER - 40-50 ; functional IR to L1 in L shoulder for improved ability lift overhead. Baseline:  Goal status: IN PROGRESS - partially met 07/31/24  4.  Patient will be able to return to all activities unrestricted for improved ability to perform yard work functions and participate with family.  Baseline:  Goal status: IN PROGRESS 07/31/24  5.  Patient will demonstrate grade of 5/5 MMT grade in all tested musculature as evidence of improved strength to assist with lifting at home. Baseline:  Goal status: IN PROGRESS 07/31/24   PLAN:  PT FREQUENCY: 1-2x/week  PT DURATION: 12 weeks  PLANNED INTERVENTIONS:[id number]- PT Re-evaluation, 97110-Therapeutic exercises, 97530- Therapeutic activity, 97112- Neuromuscular re-education, 97535- Self Care, [id number]- Manual therapy, [phone]- Gait training, [phone]- Orthotic Fit/training, [phone]- Canalith repositioning, [id number]- Aquatic Therapy, [phone]- Splinting, [phone]- Wound care (first 20 sq cm), 97598- Wound care (each additional 20 sq cm)Patient/Family education, Balance training, Stair training, Taping, Dry Needling, Joint mobilization, Joint manipulation, Spinal manipulation, Spinal mobilization, Scar mobilization, and DME instructions.  PLAN FOR NEXT SESSION: progress per protocol, progress PREs as appropriate/tolerated - WB for proximation.   Prentice RAMAN Inis Borneman, PT, DPT 08/07/2024, 9:34 AM    "

## 2024-08-10 ENCOUNTER — Encounter (HOSPITAL_BASED_OUTPATIENT_CLINIC_OR_DEPARTMENT_OTHER): Admitting: Physical Therapy

## 2024-08-14 ENCOUNTER — Encounter (HOSPITAL_BASED_OUTPATIENT_CLINIC_OR_DEPARTMENT_OTHER): Payer: Self-pay | Admitting: Physical Therapy

## 2024-08-14 ENCOUNTER — Ambulatory Visit (HOSPITAL_BASED_OUTPATIENT_CLINIC_OR_DEPARTMENT_OTHER): Admitting: Physical Therapy

## 2024-08-14 DIAGNOSIS — M25512 Pain in left shoulder: Secondary | ICD-10-CM

## 2024-08-14 DIAGNOSIS — M6281 Muscle weakness (generalized): Secondary | ICD-10-CM

## 2024-08-14 DIAGNOSIS — R29898 Other symptoms and signs involving the musculoskeletal system: Secondary | ICD-10-CM

## 2024-08-14 DIAGNOSIS — M25612 Stiffness of left shoulder, not elsewhere classified: Secondary | ICD-10-CM

## 2024-08-14 NOTE — Therapy (Signed)
 " OUTPATIENT PHYSICAL THERAPY SHOULDER TREATMENT NOTE   Patient Name: Greg Rogers. MRN: [id number] DOB:1940-12-06, 83 y.o., male Today's Date: 08/14/2024    END OF SESSION:  PT End of Session - 08/14/24 0849     Visit Number 31    Number of Visits 48    Date for Recertification  09/11/24    Authorization Type MCR    Progress Note Due on Visit 38    PT Start Time 0849    PT Stop Time 0929    PT Time Calculation (min) 40 min    Activity Tolerance Patient tolerated treatment well    Behavior During Therapy WFL for tasks assessed/performed          Past Medical History:  Diagnosis Date   Abnormal GGT test    With otherwise normal work up.   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)    Persistent disorder of initiating or maintaining sleep    Pes planus    Rosacea    Snoring    Past Surgical History:  Procedure Laterality Date   APPENDECTOMY  1949   at age 74   CERVICAL FUSION  03/2016   C 3&4   Cyst removed from breast  1960   FOOT SURGERY  01/2009   Left   HERNIA REPAIR  1956   at age 73 (single)   HERNIA REPAIR  1960   (double)   Left 2nd toe removed  03/2017   REVERSE SHOULDER ARTHROPLASTY Left 03/27/2024   Procedure: ARTHROPLASTY, SHOULDER, TOTAL, REVERSE;  Surgeon: Genelle Standing, MD;  Location: [facility];  Service: Orthopedics;  Laterality: Left;   SPINE SURGERY     TONSILLECTOMY AND ADENOIDECTOMY  ~ 1950   VASECTOMY  ~ 1984   Patient Active Problem List   Diagnosis Date Noted   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 03/27/2024   Leg pain 03/21/2024   Shoulder pain 12/07/2023   Change in stool 11/02/2023   Nasal lesion 06/02/2023   Occipital neuralgia 10/12/2020   Wound of right leg, initial encounter  07/03/2019   Healthcare maintenance 03/04/2017   Foot pain 07/22/2016   Cervical stenosis of spine 01/06/2016   thyroid  nodule-benign path on bx 01/2016 01/06/2016   Radicular pain in right arm 12/22/2015   Radicular leg pain 12/22/2015   Back pain 07/06/2015   Constipation 07/06/2015   FH: CAD (coronary artery disease) 05/23/2015   Family history of ischemic heart disease and other diseases of the circulatory system 05/23/2015   Advance care planning 11/26/2014   Pain in joint, shoulder region 11/26/2014   Medicare annual wellness visit, subsequent 09/27/2012   GERD (gastroesophageal reflux disease) 07/05/2012   Irritability 03/29/2011   Advance directive on file 01/09/2011   Pain in joint, other site 01/04/2011   Arthropathy 09/02/2010   NEPHROLITHIASIS, HX OF 09/02/2010   Diabetes mellitus without complication (HCC) 09/01/2010   HLD (hyperlipidemia) 10/24/2009   SNORING 10/24/2009   FOOT PAIN, BILATERAL 10/06/2009   Pes planus 10/06/2009   Acquired flat foot 10/06/2009    PCP: Cleatus Arlyss RAMAN, MD  REFERRING PROVIDER: Genelle Standing, MD  REFERRING DIAG: S46.012A (ICD-10-CM) - Traumatic complete tear of  left rotator cuff, initial encounter;  1. Left shoulder reverse shoulder arthroplasty 2. Left shoulder biceps tenodesis  THERAPY DIAG:  Acute pain of left shoulder  Stiffness of left shoulder, not elsewhere classified  Muscle weakness (generalized)  Other symptoms and signs involving the musculoskeletal system  Rationale for Evaluation and Treatment: Rehabilitation  ONSET DATE: DOS 03/27/24  SUBJECTIVE:                                                                                                                                                                                      SUBJECTIVE STATEMENT: Patient states sore after last session. Took a few days to recover. Did too much work over the weekend too.   EVAL: Patient states shoulder has been doing good.  Using sling. Was very active prior to injury/surgery with gardening/yard work Hand dominance: Right  PERTINENT HISTORY: DM, HLD, chronic shoulder pain with fall in March while exiting ski lift with RC tear  PAIN:  Are you having pain? Yes 0/10 Location: anterior Lt shoulder/upper arm Description: dull, uncomfortable Worsened by: raising arm to front Relieved by:  massage, icy hot      PRECAUTIONS: Other: L reverse TSA   RED FLAGS: None   WEIGHT BEARING RESTRICTIONS: NWB LUE at eval  FALLS:  Has patient fallen in last 6 months? Yes. Number of falls 1  PLOF: Independent  PATIENT GOALS:regain use of the arm  OBJECTIVE: (objective measures from initial evaluation unless otherwise dated)  OBSERVATION: using sling, waterproof dressing intact  PATIENT SURVEYS:  UEFS  Extreme difficulty/unable (0), Quite a bit of difficulty (1), Moderate difficulty (2), Little difficulty (3), No difficulty (4) Survey date:  03/30/24 06/19/24 07/31/24  Any of your usual work, household or school activities 0  3  2. Your usual hobbies, recreational/sport activities 1  2   3. Lifting a bag of groceries to waist level 2  3   4. Lifting a bag of groceries above your head 0  1  5. Grooming your hair 4  4  6. Pushing up on your hands (I.e. from bathtub or chair) 0  3  7. Preparing food (I.e. peeling/cutting) 1  4  8. Driving  3  4  9. Vacuuming, sweeping, or raking 4  4  10. Dressing  3  4  11. Doing up buttons 2  3  12. Using tools/appliances 1  4  13. Opening doors 0  3  14. Cleaning  3  4  15. Tying or lacing shoes 2  4  16. Sleeping  3  4  17. Laundering clothes (I.e. washing, ironing, folding) 3  4  18. Opening a jar  3  3  19. Throwing a ball 1  3  20. Carrying a small suitcase with your affected limb.  1  4  Score total:  35/80 58/80 68/80      05/08/24- UEFS 15/80 via online version, later noted questions were different than one in epic   COGNITION: Overall cognitive status: Within  functional limits for tasks assessed     SENSATION: WFL  POSTURE: rounded shoulders and forward head   UPPER EXTREMITY ROM: AROM RUE WFL  Passive ROM Left eval L 04/06/24 L 04/13/24 L 04/20/24 L 04/24/24 L 04/27/24 L 9/17 L  9/19 L 9/26 L 06/01/24 L 06/05/24 L 06/07/24 L 06/19/24 L 07/27/24 L 07/31/24 L 08/07/24  Shoulder flexion 100 PROM 110 AAROM 110* PROM, 120* AAROM supine  120-130* PROM (bit guarded today) 130* PROM and AROM  114 AROM 140 AAROM 145 AAROM 138 AAROM Improves to 149 AAROM 139 AAROM 148 AAROM improves to 153 AAROM 144* AAROM  130 degrees on shoulder flexion ladder  120 before manual, 135 after in supine, 140 on shoulder flexion ladder  135 deg before manual 140  130 AAROM against gravity  149 AAROM 145 AAROM AROM 90 - limited by strength 140 AAROM Improves to 146 AAROM with manual   Shoulder extension                  Shoulder abduction                  Shoulder adduction                  Shoulder internal rotation         Mid glute. L5 at best with IR stretch     L5  L4   Shoulder external rotation 20 25* next to body PROM setaed (very guarded), 30* AAROM seated  30* PROM arm next to body  30* PROM arm next to body      30* PROM/AAROM supine arm next to body but limited by anterior shoulder pain     60 deg   C7   Elbow flexion                  Elbow extension                  Wrist flexion                  Wrist extension                  Wrist ulnar deviation                  Wrist radial deviation                  Wrist pronation                  Wrist supination                  (Blank rows = not tested) *=pain/symptoms  UPPER EXTREMITY MMT: NT due to post op status  MMT Right eval Left eval Left 05/11/24 Left 06/19/24 Right 07/31/24 Left 07/31/24  Shoulder flexion   3 UT compensations  3 4 3   Shoulder extension        Shoulder abduction   3 UT compensations  3 4 3   Shoulder adduction        Shoulder internal rotation    4- 5 4-  Shoulder external  rotation    4- 4 4-  Middle trapezius        Lower trapezius        Elbow flexion        Elbow extension        Wrist flexion        Wrist extension        Wrist ulnar deviation        Wrist radial deviation        Wrist pronation        Wrist supination        Grip strength (lbs)        (Blank rows = not tested) *=pain/symptoms    PALPATION:  LUE gross edema                                                                                                       TODAY'S TREATMENT:                                                                                                                                         DATE:  Manual: STM to deltoid, teres minor/major, lats, UT, pec Shoulder flexion AAROM supine 2 x 10    08/07/24 Manual: STM to deltoid, teres minor/major, lats, UT; pin and stretch teres, lats into flexion, pin and stretch pec Shoulder flexion wall slides with lift off 1 x 8 Standing ER stretch at door 3 x 20 second holds Functional IR stretch with strap 5 x 10 second holds Shoulder flexion with loop band at wall 1 x 10 Bend over shoulder flexion 1 x 10 Supine shoulder Flexion D2 RTB 1 x 5 - difficult today  08/03/24 Manual: STM to L deltoid pre and post dry needling for trigger point identification and muscular relaxation. Trigger Point Dry Needling  Initial Treatment: Pt instructed on Dry Needling rational, procedures, and possible side effects. Pt instructed to expect mild to moderate muscle soreness later in the day and/or into the next day.  Pt instructed in methods to reduce muscle soreness. Pt instructed to continue prescribed HEP. Patient was educated on signs and symptoms of infection and other risk factors and advised to seek medical attention should they occur.  Patient verbalized understanding of these instructions and education.   Patient Verbal Consent Given: Yes Education Handout Provided: Yes Muscles Treated: L deltoid Electrical Stimulation  Performed: No Treatment Response/Outcome: decrease in tissue tension/ twitch response  Supine AAROM flexion 2 x 10 Shoulder  flexion wall slides with lift off 1 x 8 Shoulder press with PT assist 1 x 10  07/31/24 Pulley 2 minutes for warm up and ROM Reassessment Manual: STM to deltoid Supine bilateral ER RTB 2 x 10 Supine horizontal abduction RTB  2 x 10 Supine shoulder PNF D2 1 x 8 - pain in deltoid trigger poing Supine Shoulder flexion/PNF D2 RTB 2 x 10  Standing shoulder extension RTB 2 x 10   07/27/24 Manual: STM to deltoid, teres minor/major, lats; pin and stretch teres, lats into flexion, pin and stretch pec, Grade II-III Distal clavicle mobs Supine bilateral ER RTB 2 x 10 Supine horizontal abduction RTB  2 x 10 Supine shoulder PNF D2 1 x 8 - pain in deltoid trigger poing Supine Shoulder flexion/PNF D2 RTB to about 90 2 x 10  Shoulder flexion stretch at rail   07/25/24 UE bike L4 x4 min fwd/backward  Manual: STM to deltoid, teres minor/major, lats; pin and stretch teres, lats into flexion AAROM flexion with RTB in hands 1 x 10  Manual: STM to L deltoid pre and post dry needling for trigger point identification and muscular relaxation. Trigger Point Dry Needling  Initial Treatment: Pt instructed on Dry Needling rational, procedures, and possible side effects. Pt instructed to expect mild to moderate muscle soreness later in the day and/or into the next day.  Pt instructed in methods to reduce muscle soreness. Pt instructed to continue prescribed HEP. Patient was educated on signs and symptoms of infection and other risk factors and advised to seek medical attention should they occur.  Patient verbalized understanding of these instructions and education.   Patient Verbal Consent Given: Yes Education Handout Provided: Yes Muscles Treated: L deltoid Electrical Stimulation Performed: No Treatment Response/Outcome: decrease in tissue tension    07/20/24 UE bike L4 x4  min fwd/backward  STM to deltoid, biceps  Grade III posterior and inferior GHJ glides  Shoulder PROM Stargazer stretch x5  SL ER x10  Standing shoulder IR x5 RTB Shoulder flexion ladder x10   07/18/24 STM to deltoid, biceps  Grade III posterior and inferior GHJ glides  Shoulder PROM D2 shoulder flexion in supine 3x10 Stargazer stretch in supine x10 Shoulder flexion with perpendicular resistance RTB 2x10    07/10/24 -UBE L1: 30 sec forward/30 sec backward x , towel in small of back for improved posture - quadruped position (for shoulder approximation)- rocking R/L; alternating forward arm lifts; alternating side arm lifts  - counter push up position (for shoulder approximation) - alternating rows, alternating opposite shoulder taps - finger ladder into Lt shoulder flexion  - in front of mirror: bil shoulder flexion to 80deg and steering wheel motion -in front of mirror: bil bicep curl to overhead press with ball -> 2# wt x 10  - reverse mini wall push up x 10  - Stargazer stretch 60 sec in supine (back of Lt palm on forehead) - manual therapy:  STM to Lt pec major, biceps brachii, ant deltoid, latissimus - Lt shoulder flexion 60-110 in supine with 1#, cues for good scapular positioning   PATIENT EDUCATION:  Education details:  HEP, relevant anatomy/biomechanics, protocol/precautions. 07/31/24: reassessment findings, progress made, POC, HEP Person educated: Patient Education method: Explanation, Demonstration,  Education comprehension: verbalized understanding, returned demonstration, verbal cues required, and tactile cues required  HOME EXERCISE PROGRAM:  Access Code: [email or web address] URL: [email or web address] Date: 04/13/2024 Prepared by: Josette Rough  Exercises - Wrist AROM Flexion Extension  - 3 x daily - 7 x weekly -  2 sets - 10 reps - Seated Elbow Flexion and Extension AROM (Mirrored)  - 3 x daily - 7 x weekly - 2 sets - 10 reps - Circular Shoulder  Pendulum with Table Support (Mirrored)  - 3 x daily - 7 x weekly - 2 sets - 10 reps - Seated Scapular Retraction  - 3 x daily - 7 x weekly - 2 sets - 10 reps - Seated Shoulder Flexion Towel Slide at Table Top (Mirrored)  - 3 x daily - 7 x weekly - 10 reps - 10 second hold - Supine Shoulder Flexion AAROM with Hands Clasped  - 3 x daily - 7 x weekly - 3 sets - 10 reps - Supine Shoulder External Rotation with Dowel  - 3 x daily - 7 x weekly - 2 sets - 10 reps - Standing Isometric Shoulder Flexion with Doorway - Arm Bent  - 1 x daily - 7 x weekly - 2 sets - 10 reps - 3 seconds  hold - Standing Isometric Shoulder Abduction with Doorway - Arm Bent  - 1 x daily - 7 x weekly - 2 sets - 10 reps - 3 seconds  hold - Standing Isometric Shoulder Extension with Doorway - Arm Bent  - 1 x daily - 7 x weekly - 2 sets - 10 reps - 3 seconds  hold - Standing Isometric Shoulder External Rotation with Doorway  - 1 x daily - 7 x weekly - 2 sets - 10 reps - 3 seconds  hold   ASSESSMENT:  CLINICAL IMPRESSION: Patient with increased symptoms today likely from too much activity recently. Limited ROM and hyperactive tender musculature throughout L shoulder. Session with focus on manual to decrease symptoms and improve functional mobility. Patient will continue to benefit from skilled physical therapy in order to improve function and reduce impairment.     EVAL: Patient a 83 y.o. y.o. male who was seen today for physical therapy evaluation and treatment for L reverse TSA DOS 03/27/24. Patient presents with pain limited deficits in L shoulder strength, ROM, endurance, activity tolerance, and functional mobility with ADL. Patient is having to modify and restrict ADL as indicated by outcome measure score as well as subjective information and objective measures which is affecting overall participation. Patient will benefit from skilled physical therapy in order to improve function and reduce impairment.  OBJECTIVE IMPAIRMENTS:  decreased activity tolerance, decreased endurance, decreased mobility, decreased ROM, decreased strength, increased muscle spasms, impaired flexibility, impaired UE functional use, improper body mechanics, and pain  ACTIVITY LIMITATIONS:  carrying, lifting, bending, sleeping, bed mobility, bathing, dressing, reach over head, hygiene/grooming, and caring for others  PARTICIPATION LIMITATIONS:  meal prep, cleaning, laundry, driving, shopping, community activity, and yard work  PERSONAL FACTORS: Age, Time since onset of injury/illness/exacerbation, and 1-2 comorbidities: DM, HLD are also affecting patient's functional outcome.   REHAB POTENTIAL: Good  CLINICAL DECISION MAKING: Evolving/moderate complexity  EVALUATION COMPLEXITY: Moderate  GOALS: Goals reviewed with patient? Yes  SHORT TERM GOALS: Target date: 05/11/2024    Patient will be independent with HEP in order to improve functional outcomes. Baseline: Goal status: MET 06/19/24  2.  Patient will report at least 25% improvement in symptoms for improved quality of life. Baseline:  Goal status: MET 06/19/24  3.  Patient will demonstrate Elevation to 130 deg and ER to 30 deg - passive, active assisted or active  Baseline:  Goal status: MET 04/20/24  4.  Patient will be out of sling per surgeon approval for  improved functional use of L shoulder. Baseline:  Goal status: MET 04/13/24   LONG TERM GOALS: Target date: 09/11/2024   Patient will report at least 75% improvement in symptoms for improved quality of life. Baseline:  Goal status: MET (65% 06/19/24)  2.  Patient will improve UEFS score by at least 25 points in order to indicate improved tolerance to activity. Baseline:  Goal status: MET 07/31/24  3.  Patient will demonstrate at least PROM/AAROM/AROM to: Elevation - 145-160; ER - 40-50 ; functional IR to L1 in L shoulder for improved ability lift overhead. Baseline:  Goal status: IN PROGRESS - partially met 07/31/24  4.   Patient will be able to return to all activities unrestricted for improved ability to perform yard work functions and participate with family.  Baseline:  Goal status: IN PROGRESS 07/31/24  5.  Patient will demonstrate grade of 5/5 MMT grade in all tested musculature as evidence of improved strength to assist with lifting at home. Baseline:  Goal status: IN PROGRESS 07/31/24   PLAN:  PT FREQUENCY: 1-2x/week  PT DURATION: 12 weeks  PLANNED INTERVENTIONS:[id number]- PT Re-evaluation, 97110-Therapeutic exercises, 97530- Therapeutic activity, 97112- Neuromuscular re-education, 97535- Self Care, [id number]- Manual therapy, [phone]- Gait training, [phone]- Orthotic Fit/training, [phone]- Canalith repositioning, [id number]- Aquatic Therapy, [phone]- Splinting, [phone]- Wound care (first 20 sq cm), 97598- Wound care (each additional 20 sq cm)Patient/Family education, Balance training, Stair training, Taping, Dry Needling, Joint mobilization, Joint manipulation, Spinal manipulation, Spinal mobilization, Scar mobilization, and DME instructions.  PLAN FOR NEXT SESSION: progress per protocol, progress PREs as appropriate/tolerated - WB for proximation.   Prentice RAMAN Alvey Brockel, PT, DPT 08/14/2024, 8:50 AM    "

## 2024-08-17 ENCOUNTER — Encounter (HOSPITAL_BASED_OUTPATIENT_CLINIC_OR_DEPARTMENT_OTHER): Payer: Self-pay | Admitting: Physical Therapy

## 2024-08-17 ENCOUNTER — Ambulatory Visit (HOSPITAL_BASED_OUTPATIENT_CLINIC_OR_DEPARTMENT_OTHER): Attending: Orthopaedic Surgery | Admitting: Physical Therapy

## 2024-08-17 DIAGNOSIS — M25612 Stiffness of left shoulder, not elsewhere classified: Secondary | ICD-10-CM | POA: Insufficient documentation

## 2024-08-17 DIAGNOSIS — M25512 Pain in left shoulder: Secondary | ICD-10-CM | POA: Diagnosis present

## 2024-08-17 DIAGNOSIS — R29898 Other symptoms and signs involving the musculoskeletal system: Secondary | ICD-10-CM | POA: Diagnosis present

## 2024-08-17 DIAGNOSIS — M6281 Muscle weakness (generalized): Secondary | ICD-10-CM | POA: Insufficient documentation

## 2024-08-17 NOTE — Therapy (Signed)
 " OUTPATIENT PHYSICAL THERAPY SHOULDER TREATMENT NOTE   Patient Name: Greg Rogers. MRN: [id number] DOB:[birth year], 84 y.o., male Today's Date: 08/17/2024    END OF SESSION:  PT End of Session - 08/17/24 0850     Visit Number 32    Number of Visits 48    Date for Recertification  09/11/24    Authorization Type MCR    Progress Note Due on Visit 38    PT Start Time 0850    PT Stop Time 0930    PT Time Calculation (min) 40 min    Activity Tolerance Patient tolerated treatment well    Behavior During Therapy WFL for tasks assessed/performed          Past Medical History:  Diagnosis Date   Abnormal GGT test    With otherwise normal work up.   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)    Persistent disorder of initiating or maintaining sleep    Pes planus    Rosacea    Snoring    Past Surgical History:  Procedure Laterality Date   APPENDECTOMY  1949   at age 65   CERVICAL FUSION  03/2016   C 3&4   Cyst removed from breast  1960   FOOT SURGERY  01/2009   Left   HERNIA REPAIR  1956   at age 47 (single)   HERNIA REPAIR  1960   (double)   Left 2nd toe removed  03/2017   REVERSE SHOULDER ARTHROPLASTY Left 03/27/2024   Procedure: ARTHROPLASTY, SHOULDER, TOTAL, REVERSE;  Surgeon: Genelle Standing, MD;  Location: [facility];  Service: Orthopedics;  Laterality: Left;   SPINE SURGERY     TONSILLECTOMY AND ADENOIDECTOMY  ~ 1950   VASECTOMY  ~ 1984   Patient Active Problem List   Diagnosis Date Noted   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 03/27/2024   Leg pain 03/21/2024   Shoulder pain 12/07/2023   Change in stool 11/02/2023   Nasal lesion 06/02/2023   Occipital neuralgia 10/12/2020   Wound of right leg, initial encounter  07/03/2019   Healthcare maintenance 03/04/2017   Foot pain 07/22/2016   Cervical stenosis of spine 01/06/2016   thyroid  nodule-benign path on bx 01/2016 01/06/2016   Radicular pain in right arm 12/22/2015   Radicular leg pain 12/22/2015   Back pain 07/06/2015   Constipation 07/06/2015   FH: CAD (coronary artery disease) 05/23/2015   Family history of ischemic heart disease and other diseases of the circulatory system 05/23/2015   Advance care planning 11/26/2014   Pain in joint, shoulder region 11/26/2014   Medicare annual wellness visit, subsequent 09/27/2012   GERD (gastroesophageal reflux disease) 07/05/2012   Irritability 03/29/2011   Advance directive on file 01/09/2011   Pain in joint, other site 01/04/2011   Arthropathy 09/02/2010   NEPHROLITHIASIS, HX OF 09/02/2010   Diabetes mellitus without complication (HCC) 09/01/2010   HLD (hyperlipidemia) 10/24/2009   SNORING 10/24/2009   FOOT PAIN, BILATERAL 10/06/2009   Pes planus 10/06/2009   Acquired flat foot 10/06/2009    PCP: Cleatus Arlyss RAMAN, MD  REFERRING PROVIDER: Genelle Standing, MD  REFERRING DIAG: S46.012A (ICD-10-CM) - Traumatic complete tear of  left rotator cuff, initial encounter;  1. Left shoulder reverse shoulder arthroplasty 2. Left shoulder biceps tenodesis  THERAPY DIAG:  Acute pain of left shoulder  Stiffness of left shoulder, not elsewhere classified  Muscle weakness (generalized)  Other symptoms and signs involving the musculoskeletal system  Rationale for Evaluation and Treatment: Rehabilitation  ONSET DATE: DOS 03/27/24  SUBJECTIVE:                                                                                                                                                                                      SUBJECTIVE STATEMENT: Patient states hasn't done much this week, has had massage every day this week. Not as many knots. A little tender at the top but not bad.   EVAL: Patient states  shoulder has been doing good. Using sling. Was very active prior to injury/surgery with gardening/yard work Hand dominance: Right  PERTINENT HISTORY: DM, HLD, chronic shoulder pain with fall in March while exiting ski lift with RC tear  PAIN:  Are you having pain? Yes 0/10 Location: anterior Lt shoulder/upper arm Description: dull, uncomfortable Worsened by: raising arm to front Relieved by:  massage, icy hot      PRECAUTIONS: Other: L reverse TSA   RED FLAGS: None   WEIGHT BEARING RESTRICTIONS: NWB LUE at eval  FALLS:  Has patient fallen in last 6 months? Yes. Number of falls 1  PLOF: Independent  PATIENT GOALS:regain use of the arm  OBJECTIVE: (objective measures from initial evaluation unless otherwise dated)  OBSERVATION: using sling, waterproof dressing intact  PATIENT SURVEYS:  UEFS  Extreme difficulty/unable (0), Quite a bit of difficulty (1), Moderate difficulty (2), Little difficulty (3), No difficulty (4) Survey date:  03/30/24 06/19/24 07/31/24  Any of your usual work, household or school activities 0  3  2. Your usual hobbies, recreational/sport activities 1  2   3. Lifting a bag of groceries to waist level 2  3   4. Lifting a bag of groceries above your head 0  1  5. Grooming your hair 4  4  6. Pushing up on your hands (I.e. from bathtub or chair) 0  3  7. Preparing food (I.e. peeling/cutting) 1  4  8. Driving  3  4  9. Vacuuming, sweeping, or raking 4  4  10. Dressing  3  4  11. Doing up buttons 2  3  12. Using tools/appliances 1  4  13. Opening doors 0  3  14. Cleaning  3  4  15. Tying or lacing shoes 2  4  16. Sleeping  3  4  17. Laundering clothes (I.e. washing, ironing, folding) 3  4  18. Opening a jar 3  3  19. Throwing a ball 1  3  20. Carrying a small suitcase with your affected limb.  1  4  Score total:  35/80 58/80 68/80      05/08/24- UEFS 15/80 via online version, later noted questions were different than one in epic    COGNITION: Overall cognitive status: Within functional limits for tasks assessed     SENSATION: WFL  POSTURE: rounded shoulders and forward head   UPPER EXTREMITY ROM: AROM RUE WFL  Passive ROM Left eval L 04/06/24 L 04/13/24 L 04/20/24 L 04/24/24 L 04/27/24 L 9/17 L  9/19 L 9/26 L 06/01/24 L 06/05/24 L 06/07/24 L 06/19/24 L 07/27/24 L 07/31/24 L 08/07/24 Left 08/17/24  Shoulder flexion 100 PROM 110 AAROM 110* PROM, 120* AAROM supine  120-130* PROM (bit guarded today) 130* PROM and AROM  114 AROM 140 AAROM 145 AAROM 138 AAROM Improves to 149 AAROM 139 AAROM 148 AAROM improves to 153 AAROM 144* AAROM  130 degrees on shoulder flexion ladder  120 before manual, 135 after in supine, 140 on shoulder flexion ladder  135 deg before manual 140  130 AAROM against gravity  149 AAROM 145 AAROM AROM 90 - limited by strength 140 AAROM Improves to 146 AAROM with manual  147 AAROM  Shoulder extension                   Shoulder abduction                   Shoulder adduction                   Shoulder internal rotation         Mid glute. L5 at best with IR stretch     L5  L4    Shoulder external rotation 20 25* next to body PROM setaed (very guarded), 30* AAROM seated  30* PROM arm next to body  30* PROM arm next to body      30* PROM/AAROM supine arm next to body but limited by anterior shoulder pain     60 deg   C7    Elbow flexion                   Elbow extension                   Wrist flexion                   Wrist extension                   Wrist ulnar deviation                   Wrist radial deviation                   Wrist pronation                   Wrist supination                   (Blank rows = not tested) *=pain/symptoms  UPPER EXTREMITY MMT: NT due to post op status  MMT Right eval Left eval Left 05/11/24 Left 06/19/24 Right 07/31/24 Left 07/31/24  Shoulder flexion   3 UT compensations  3 4 3   Shoulder extension        Shoulder abduction   3 UT compensations  3 4  3   Shoulder adduction        Shoulder internal rotation    4- 5 4-  Shoulder external rotation    4- 4 4-  Middle trapezius        Lower trapezius        Elbow flexion        Elbow extension        Wrist flexion        Wrist extension        Wrist ulnar deviation        Wrist radial deviation        Wrist pronation        Wrist supination        Grip strength (lbs)        (Blank rows = not tested) *=pain/symptoms    PALPATION:  LUE gross edema                                                                                                       TODAY'S TREATMENT:                                                                                                                                         DATE:  08/18/23 Manual: STM to deltoid, teres minor/major, lats, UT, pec Shoulder flexion AAROM supine 2 x 10 Sidelying shoulder abduction 2 x 10 Bent shoulder extension 2 x 10 Shoulder isometrics flexion, abduction, extension 5 x 10 second holds    08/14/24 Manual: STM to deltoid, teres minor/major, lats, UT, pec Shoulder flexion AAROM supine 2 x 10    08/07/24 Manual: STM to deltoid, teres minor/major, lats, UT; pin and stretch teres, lats into flexion, pin and stretch pec Shoulder flexion wall slides with lift off 1 x 8 Standing ER stretch at door 3 x 20 second holds Functional IR stretch with strap 5 x 10 second holds Shoulder flexion with loop band at wall 1 x 10 Bend over shoulder flexion 1 x 10 Supine shoulder Flexion D2 RTB 1 x 5 - difficult today  08/03/24 Manual: STM to L deltoid pre and post dry needling for trigger point identification and muscular relaxation. Trigger Point Dry Needling  Initial Treatment: Pt instructed on Dry Needling rational, procedures, and possible side effects. Pt instructed to expect mild to moderate muscle soreness later in the day and/or into the next day.  Pt instructed in methods to reduce muscle soreness. Pt instructed to continue  prescribed  HEP. Patient was educated on signs and symptoms of infection and other risk factors and advised to seek medical attention should they occur.  Patient verbalized understanding of these instructions and education.   Patient Verbal Consent Given: Yes Education Handout Provided: Yes Muscles Treated: L deltoid Electrical Stimulation Performed: No Treatment Response/Outcome: decrease in tissue tension/ twitch response  Supine AAROM flexion 2 x 10 Shoulder flexion wall slides with lift off 1 x 8 Shoulder press with PT assist 1 x 10  07/31/24 Pulley 2 minutes for warm up and ROM Reassessment Manual: STM to deltoid Supine bilateral ER RTB 2 x 10 Supine horizontal abduction RTB  2 x 10 Supine shoulder PNF D2 1 x 8 - pain in deltoid trigger poing Supine Shoulder flexion/PNF D2 RTB 2 x 10  Standing shoulder extension RTB 2 x 10   07/27/24 Manual: STM to deltoid, teres minor/major, lats; pin and stretch teres, lats into flexion, pin and stretch pec, Grade II-III Distal clavicle mobs Supine bilateral ER RTB 2 x 10 Supine horizontal abduction RTB  2 x 10 Supine shoulder PNF D2 1 x 8 - pain in deltoid trigger poing Supine Shoulder flexion/PNF D2 RTB to about 90 2 x 10  Shoulder flexion stretch at rail   07/25/24 UE bike L4 x4 min fwd/backward  Manual: STM to deltoid, teres minor/major, lats; pin and stretch teres, lats into flexion AAROM flexion with RTB in hands 1 x 10  Manual: STM to L deltoid pre and post dry needling for trigger point identification and muscular relaxation. Trigger Point Dry Needling  Initial Treatment: Pt instructed on Dry Needling rational, procedures, and possible side effects. Pt instructed to expect mild to moderate muscle soreness later in the day and/or into the next day.  Pt instructed in methods to reduce muscle soreness. Pt instructed to continue prescribed HEP. Patient was educated on signs and symptoms of infection and other risk factors and  advised to seek medical attention should they occur.  Patient verbalized understanding of these instructions and education.   Patient Verbal Consent Given: Yes Education Handout Provided: Yes Muscles Treated: L deltoid Electrical Stimulation Performed: No Treatment Response/Outcome: decrease in tissue tension    07/20/24 UE bike L4 x4 min fwd/backward  STM to deltoid, biceps  Grade III posterior and inferior GHJ glides  Shoulder PROM Stargazer stretch x5  SL ER x10  Standing shoulder IR x5 RTB Shoulder flexion ladder x10   07/18/24 STM to deltoid, biceps  Grade III posterior and inferior GHJ glides  Shoulder PROM D2 shoulder flexion in supine 3x10 Stargazer stretch in supine x10 Shoulder flexion with perpendicular resistance RTB 2x10      PATIENT EDUCATION:  Education details:  HEP, relevant anatomy/biomechanics, protocol/precautions. 07/31/24: reassessment findings, progress made, POC, HEP Person educated: Patient Education method: Explanation, Demonstration,  Education comprehension: verbalized understanding, returned demonstration, verbal cues required, and tactile cues required  HOME EXERCISE PROGRAM:  Access Code: [email or web address] URL: [email or web address] Date: 04/13/2024 Prepared by: Josette Rough  Exercises - Wrist AROM Flexion Extension  - 3 x daily - 7 x weekly - 2 sets - 10 reps - Seated Elbow Flexion and Extension AROM (Mirrored)  - 3 x daily - 7 x weekly - 2 sets - 10 reps - Circular Shoulder Pendulum with Table Support (Mirrored)  - 3 x daily - 7 x weekly - 2 sets - 10 reps - Seated Scapular Retraction  - 3 x daily - 7 x weekly - 2 sets - 10 reps - Seated  Shoulder Flexion Towel Slide at Table Top (Mirrored)  - 3 x daily - 7 x weekly - 10 reps - 10 second hold - Supine Shoulder Flexion AAROM with Hands Clasped  - 3 x daily - 7 x weekly - 3 sets - 10 reps - Supine Shoulder External Rotation with Dowel  - 3 x daily - 7 x weekly - 2 sets - 10 reps -  Standing Isometric Shoulder Flexion with Doorway - Arm Bent  - 1 x daily - 7 x weekly - 2 sets - 10 reps - 3 seconds  hold - Standing Isometric Shoulder Abduction with Doorway - Arm Bent  - 1 x daily - 7 x weekly - 2 sets - 10 reps - 3 seconds  hold - Standing Isometric Shoulder Extension with Doorway - Arm Bent  - 1 x daily - 7 x weekly - 2 sets - 10 reps - 3 seconds  hold - Standing Isometric Shoulder External Rotation with Doorway  - 1 x daily - 7 x weekly - 2 sets - 10 reps - 3 seconds  hold   ASSESSMENT:  CLINICAL IMPRESSION: Patient with continued hyperactive/tender musculature but improved after resting the last few days. Continued with manual with some decrease in tissue tension. Returned to basic shoulder activation and strengthening exercises. Patient will continue to benefit from skilled physical therapy in order to improve function and reduce impairment.     EVAL: Patient a 84 y.o. y.o. male who was seen today for physical therapy evaluation and treatment for L reverse TSA DOS 03/27/24. Patient presents with pain limited deficits in L shoulder strength, ROM, endurance, activity tolerance, and functional mobility with ADL. Patient is having to modify and restrict ADL as indicated by outcome measure score as well as subjective information and objective measures which is affecting overall participation. Patient will benefit from skilled physical therapy in order to improve function and reduce impairment.  OBJECTIVE IMPAIRMENTS: decreased activity tolerance, decreased endurance, decreased mobility, decreased ROM, decreased strength, increased muscle spasms, impaired flexibility, impaired UE functional use, improper body mechanics, and pain  ACTIVITY LIMITATIONS:  carrying, lifting, bending, sleeping, bed mobility, bathing, dressing, reach over head, hygiene/grooming, and caring for others  PARTICIPATION LIMITATIONS:  meal prep, cleaning, laundry, driving, shopping, community activity, and  yard work  PERSONAL FACTORS: Age, Time since onset of injury/illness/exacerbation, and 1-2 comorbidities: DM, HLD are also affecting patient's functional outcome.   REHAB POTENTIAL: Good  CLINICAL DECISION MAKING: Evolving/moderate complexity  EVALUATION COMPLEXITY: Moderate  GOALS: Goals reviewed with patient? Yes  SHORT TERM GOALS: Target date: 05/11/2024    Patient will be independent with HEP in order to improve functional outcomes. Baseline: Goal status: MET 06/19/24  2.  Patient will report at least 25% improvement in symptoms for improved quality of life. Baseline:  Goal status: MET 06/19/24  3.  Patient will demonstrate Elevation to 130 deg and ER to 30 deg - passive, active assisted or active  Baseline:  Goal status: MET 04/20/24  4.  Patient will be out of sling per surgeon approval for improved functional use of L shoulder. Baseline:  Goal status: MET 04/13/24   LONG TERM GOALS: Target date: 09/11/2024   Patient will report at least 75% improvement in symptoms for improved quality of life. Baseline:  Goal status: MET (65% 06/19/24)  2.  Patient will improve UEFS score by at least 25 points in order to indicate improved tolerance to activity. Baseline:  Goal status: MET 07/31/24  3.  Patient  will demonstrate at least PROM/AAROM/AROM to: Elevation - 145-160; ER - 40-50 ; functional IR to L1 in L shoulder for improved ability lift overhead. Baseline:  Goal status: IN PROGRESS - partially met 07/31/24  4.  Patient will be able to return to all activities unrestricted for improved ability to perform yard work functions and participate with family.  Baseline:  Goal status: IN PROGRESS 07/31/24  5.  Patient will demonstrate grade of 5/5 MMT grade in all tested musculature as evidence of improved strength to assist with lifting at home. Baseline:  Goal status: IN PROGRESS 07/31/24   PLAN:  PT FREQUENCY: 1-2x/week  PT DURATION: 12 weeks  PLANNED  INTERVENTIONS:[id number]- PT Re-evaluation, 97110-Therapeutic exercises, 97530- Therapeutic activity, 97112- Neuromuscular re-education, 97535- Self Care, [id number]- Manual therapy, [phone]- Gait training, [phone]- Orthotic Fit/training, [phone]- Canalith repositioning, [id number]- Aquatic Therapy, [phone]- Splinting, [phone]- Wound care (first 20 sq cm), 97598- Wound care (each additional 20 sq cm)Patient/Family education, Balance training, Stair training, Taping, Dry Needling, Joint mobilization, Joint manipulation, Spinal manipulation, Spinal mobilization, Scar mobilization, and DME instructions.  PLAN FOR NEXT SESSION: progress per protocol, progress PREs as appropriate/tolerated - WB for proximation.   Prentice RAMAN Jochebed Bills, PT, DPT 08/17/2024, 8:51 AM    "

## 2024-08-19 ENCOUNTER — Encounter (HOSPITAL_BASED_OUTPATIENT_CLINIC_OR_DEPARTMENT_OTHER): Payer: Self-pay | Admitting: Physical Therapy

## 2024-08-22 ENCOUNTER — Encounter (HOSPITAL_BASED_OUTPATIENT_CLINIC_OR_DEPARTMENT_OTHER): Payer: Self-pay | Admitting: Physical Therapy

## 2024-08-23 ENCOUNTER — Encounter (HOSPITAL_BASED_OUTPATIENT_CLINIC_OR_DEPARTMENT_OTHER): Payer: Self-pay | Admitting: Physical Therapy

## 2024-08-23 ENCOUNTER — Ambulatory Visit (HOSPITAL_BASED_OUTPATIENT_CLINIC_OR_DEPARTMENT_OTHER): Admitting: Physical Therapy

## 2024-08-23 DIAGNOSIS — R29898 Other symptoms and signs involving the musculoskeletal system: Secondary | ICD-10-CM

## 2024-08-23 DIAGNOSIS — M6281 Muscle weakness (generalized): Secondary | ICD-10-CM

## 2024-08-23 DIAGNOSIS — M25612 Stiffness of left shoulder, not elsewhere classified: Secondary | ICD-10-CM

## 2024-08-23 DIAGNOSIS — M25512 Pain in left shoulder: Secondary | ICD-10-CM | POA: Diagnosis not present

## 2024-08-23 NOTE — Therapy (Signed)
 " OUTPATIENT PHYSICAL THERAPY SHOULDER TREATMENT NOTE   Patient Name: Greg Rogers. MRN: [id number] DOB:08/08/1941, 84 y.o., male Today's Date: 08/23/2024    END OF SESSION:  PT End of Session - 08/23/24 0805     Visit Number 33    Number of Visits 48    Date for Recertification  09/11/24    Authorization Type MCR    Progress Note Due on Visit 38    PT Start Time 0804    PT Stop Time 0844    PT Time Calculation (min) 40 min    Activity Tolerance Patient tolerated treatment well    Behavior During Therapy WFL for tasks assessed/performed          Past Medical History:  Diagnosis Date   Abnormal GGT test    With otherwise normal work up.   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)    Persistent disorder of initiating or maintaining sleep    Pes planus    Rosacea    Snoring    Past Surgical History:  Procedure Laterality Date   APPENDECTOMY  1949   at age 32   CERVICAL FUSION  03/2016   C 3&4   Cyst removed from breast  1960   FOOT SURGERY  01/2009   Left   HERNIA REPAIR  1956   at age 5 (single)   HERNIA REPAIR  1960   (double)   Left 2nd toe removed  03/2017   REVERSE SHOULDER ARTHROPLASTY Left 03/27/2024   Procedure: ARTHROPLASTY, SHOULDER, TOTAL, REVERSE;  Surgeon: Genelle Standing, MD;  Location: [facility];  Service: Orthopedics;  Laterality: Left;   SPINE SURGERY     TONSILLECTOMY AND ADENOIDECTOMY  ~ 1950   VASECTOMY  ~ 1984   Patient Active Problem List   Diagnosis Date Noted   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 03/27/2024   Leg pain 03/21/2024   Shoulder pain 12/07/2023   Change in stool 11/02/2023   Nasal lesion 06/02/2023   Occipital neuralgia 10/12/2020   Wound of right leg, initial encounter  07/03/2019   Healthcare maintenance 03/04/2017   Foot pain 07/22/2016   Cervical stenosis of spine 01/06/2016   thyroid  nodule-benign path on bx 01/2016 01/06/2016   Radicular pain in right arm 12/22/2015   Radicular leg pain 12/22/2015   Back pain 07/06/2015   Constipation 07/06/2015   FH: CAD (coronary artery disease) 05/23/2015   Family history of ischemic heart disease and other diseases of the circulatory system 05/23/2015   Advance care planning 11/26/2014   Pain in joint, shoulder region 11/26/2014   Medicare annual wellness visit, subsequent 09/27/2012   GERD (gastroesophageal reflux disease) 07/05/2012   Irritability 03/29/2011   Advance directive on file 01/09/2011   Pain in joint, other site 01/04/2011   Arthropathy 09/02/2010   NEPHROLITHIASIS, HX OF 09/02/2010   Diabetes mellitus without complication (HCC) 09/01/2010   HLD (hyperlipidemia) 10/24/2009   SNORING 10/24/2009   FOOT PAIN, BILATERAL 10/06/2009   Pes planus 10/06/2009   Acquired flat foot 10/06/2009    PCP: Cleatus Arlyss RAMAN, MD  REFERRING PROVIDER: Genelle Standing, MD  REFERRING DIAG: S46.012A (ICD-10-CM) - Traumatic complete tear of  left rotator cuff, initial encounter;  1. Left shoulder reverse shoulder arthroplasty 2. Left shoulder biceps tenodesis  THERAPY DIAG:  Acute pain of left shoulder  Stiffness of left shoulder, not elsewhere classified  Muscle weakness (generalized)  Other symptoms and signs involving the musculoskeletal system  Rationale for Evaluation and Treatment: Rehabilitation  ONSET DATE: DOS 03/27/24  SUBJECTIVE:                                                                                                                                                                                      SUBJECTIVE STATEMENT: Patient states he feels like taking it easy has been helping. Doing HEP.  EVAL: Patient states shoulder has been doing good. Using sling. Was very active prior to  injury/surgery with gardening/yard work Hand dominance: Right  PERTINENT HISTORY: DM, HLD, chronic shoulder pain with fall in March while exiting ski lift with RC tear  PAIN:  Are you having pain? Yes 0/10 Location: anterior Lt shoulder/upper arm Description: dull, uncomfortable Worsened by: raising arm to front Relieved by:  massage, icy hot      PRECAUTIONS: Other: L reverse TSA   RED FLAGS: None   WEIGHT BEARING RESTRICTIONS: NWB LUE at eval  FALLS:  Has patient fallen in last 6 months? Yes. Number of falls 1  PLOF: Independent  PATIENT GOALS:regain use of the arm  OBJECTIVE: (objective measures from initial evaluation unless otherwise dated)  OBSERVATION: using sling, waterproof dressing intact  PATIENT SURVEYS:  UEFS  Extreme difficulty/unable (0), Quite a bit of difficulty (1), Moderate difficulty (2), Little difficulty (3), No difficulty (4) Survey date:  03/30/24 06/19/24 07/31/24  Any of your usual work, household or school activities 0  3  2. Your usual hobbies, recreational/sport activities 1  2   3. Lifting a bag of groceries to waist level 2  3   4. Lifting a bag of groceries above your head 0  1  5. Grooming your hair 4  4  6. Pushing up on your hands (I.e. from bathtub or chair) 0  3  7. Preparing food (I.e. peeling/cutting) 1  4  8. Driving  3  4  9. Vacuuming, sweeping, or raking 4  4  10. Dressing  3  4  11. Doing up buttons 2  3  12. Using tools/appliances 1  4  13. Opening doors 0  3  14. Cleaning  3  4  15. Tying or lacing shoes 2  4  16. Sleeping  3  4  17. Laundering clothes (I.e. washing, ironing, folding) 3  4  18. Opening a jar 3  3  19. Throwing a ball  1  3  20. Carrying a small suitcase with your affected limb.  1  4  Score total:  35/80 58/80 68/80      05/08/24- UEFS 15/80 via online version, later noted questions were different than one in epic   COGNITION: Overall cognitive status: Within functional limits for tasks  assessed     SENSATION: WFL  POSTURE: rounded shoulders and forward head   UPPER EXTREMITY ROM: AROM RUE WFL  Passive ROM Left eval L 04/06/24 L 04/13/24 L 04/20/24 L 04/24/24 L 04/27/24 L 9/17 L  9/19 L 9/26 L 06/01/24 L 06/05/24 L 06/07/24 L 06/19/24 L 07/27/24 L 07/31/24 L 08/07/24 Left 08/17/24 Left 08/23/24  Shoulder flexion 100 PROM 110 AAROM 110* PROM, 120* AAROM supine  120-130* PROM (bit guarded today) 130* PROM and AROM  114 AROM 140 AAROM 145 AAROM 138 AAROM Improves to 149 AAROM 139 AAROM 148 AAROM improves to 153 AAROM 144* AAROM  130 degrees on shoulder flexion ladder  120 before manual, 135 after in supine, 140 on shoulder flexion ladder  135 deg before manual 140  130 AAROM against gravity  149 AAROM 145 AAROM AROM 90 - limited by strength 140 AAROM Improves to 146 AAROM with manual  147 AAROM 142 AAROM  Shoulder extension                    Shoulder abduction                    Shoulder adduction                    Shoulder internal rotation         Mid glute. L5 at best with IR stretch     L5  L4     Shoulder external rotation 20 25* next to body PROM setaed (very guarded), 30* AAROM seated  30* PROM arm next to body  30* PROM arm next to body      30* PROM/AAROM supine arm next to body but limited by anterior shoulder pain     60 deg   C7     Elbow flexion                    Elbow extension                    Wrist flexion                    Wrist extension                    Wrist ulnar deviation                    Wrist radial deviation                    Wrist pronation                    Wrist supination                    (Blank rows = not tested) *=pain/symptoms  UPPER EXTREMITY MMT: NT due to post op status  MMT Right eval Left eval Left 05/11/24 Left 06/19/24 Right 07/31/24 Left 07/31/24  Shoulder flexion   3 UT compensations  3 4 3   Shoulder extension        Shoulder abduction   3 UT compensations  3 4 3   Shoulder adduction        Shoulder internal  rotation    4- 5 4-  Shoulder external rotation    4- 4 4-  Middle trapezius        Lower trapezius        Elbow flexion        Elbow extension        Wrist flexion        Wrist extension        Wrist ulnar deviation        Wrist radial deviation        Wrist pronation        Wrist supination        Grip strength (lbs)        (Blank rows = not tested) *=pain/symptoms    PALPATION:  LUE gross edema                                                                                                       TODAY'S TREATMENT:                                                                                                                                         DATE:  08/24/23 Manual: STM to deltoid, teres minor/major, lats, UT, pec Shoulder flexion AAROM supine 2# bar 2 x 10, on wedge 2# bar 2x 10 Sidelying shoulder ER 2# 2 x 10 Bent shoulder extension 2# 2 x 10 Standing IR GTB 3 x 10  Standing elbow extension GTB 2x 10  08/18/23 Manual: STM to deltoid, teres minor/major, lats, UT, pec Shoulder flexion AAROM supine 2 x 10 Sidelying shoulder abduction 2 x 10 Bent shoulder extension 2 x 10 Shoulder isometrics flexion, abduction, extension 5 x 10 second holds    08/14/24 Manual: STM to deltoid, teres minor/major, lats, UT, pec Shoulder flexion AAROM supine 2 x 10    08/07/24 Manual: STM to deltoid, teres minor/major, lats, UT; pin and stretch teres, lats into flexion, pin and stretch pec Shoulder flexion wall slides with lift off 1 x 8 Standing ER stretch at door 3 x 20 second holds Functional IR stretch with strap 5 x 10 second holds Shoulder flexion with loop band at wall 1 x 10 Bend over shoulder flexion 1 x 10 Supine shoulder Flexion D2 RTB 1 x 5 - difficult today  08/03/24 Manual: STM to L deltoid pre and post dry needling for trigger point  identification and muscular relaxation. Trigger Point Dry Needling  Initial Treatment: Pt instructed on Dry Needling rational,  procedures, and possible side effects. Pt instructed to expect mild to moderate muscle soreness later in the day and/or into the next day.  Pt instructed in methods to reduce muscle soreness. Pt instructed to continue prescribed HEP. Patient was educated on signs and symptoms of infection and other risk factors and advised to seek medical attention should they occur.  Patient verbalized understanding of these instructions and education.   Patient Verbal Consent Given: Yes Education Handout Provided: Yes Muscles Treated: L deltoid Electrical Stimulation Performed: No Treatment Response/Outcome: decrease in tissue tension/ twitch response  Supine AAROM flexion 2 x 10 Shoulder flexion wall slides with lift off 1 x 8 Shoulder press with PT assist 1 x 10  07/31/24 Pulley 2 minutes for warm up and ROM Reassessment Manual: STM to deltoid Supine bilateral ER RTB 2 x 10 Supine horizontal abduction RTB  2 x 10 Supine shoulder PNF D2 1 x 8 - pain in deltoid trigger poing Supine Shoulder flexion/PNF D2 RTB 2 x 10  Standing shoulder extension RTB 2 x 10   07/27/24 Manual: STM to deltoid, teres minor/major, lats; pin and stretch teres, lats into flexion, pin and stretch pec, Grade II-III Distal clavicle mobs Supine bilateral ER RTB 2 x 10 Supine horizontal abduction RTB  2 x 10 Supine shoulder PNF D2 1 x 8 - pain in deltoid trigger poing Supine Shoulder flexion/PNF D2 RTB to about 90 2 x 10  Shoulder flexion stretch at rail   07/25/24 UE bike L4 x4 min fwd/backward  Manual: STM to deltoid, teres minor/major, lats; pin and stretch teres, lats into flexion AAROM flexion with RTB in hands 1 x 10  Manual: STM to L deltoid pre and post dry needling for trigger point identification and muscular relaxation. Trigger Point Dry Needling  Initial Treatment: Pt instructed on Dry Needling rational, procedures, and possible side effects. Pt instructed to expect mild to moderate muscle soreness later  in the day and/or into the next day.  Pt instructed in methods to reduce muscle soreness. Pt instructed to continue prescribed HEP. Patient was educated on signs and symptoms of infection and other risk factors and advised to seek medical attention should they occur.  Patient verbalized understanding of these instructions and education.   Patient Verbal Consent Given: Yes Education Handout Provided: Yes Muscles Treated: L deltoid Electrical Stimulation Performed: No Treatment Response/Outcome: decrease in tissue tension    07/20/24 UE bike L4 x4 min fwd/backward  STM to deltoid, biceps  Grade III posterior and inferior GHJ glides  Shoulder PROM Stargazer stretch x5  SL ER x10  Standing shoulder IR x5 RTB Shoulder flexion ladder x10   07/18/24 STM to deltoid, biceps  Grade III posterior and inferior GHJ glides  Shoulder PROM D2 shoulder flexion in supine 3x10 Stargazer stretch in supine x10 Shoulder flexion with perpendicular resistance RTB 2x10      PATIENT EDUCATION:  Education details:  HEP, relevant anatomy/biomechanics, protocol/precautions. 07/31/24: reassessment findings, progress made, POC, HEP Person educated: Patient Education method: Explanation, Demonstration,  Education comprehension: verbalized understanding, returned demonstration, verbal cues required, and tactile cues required  HOME EXERCISE PROGRAM:  Access Code: [email or web address] URL: [email or web address] Date: 04/13/2024 Prepared by: Josette Rough  Exercises - Wrist AROM Flexion Extension  - 3 x daily - 7 x weekly - 2 sets - 10 reps - Seated Elbow Flexion and Extension AROM (Mirrored)  - 3 x  daily - 7 x weekly - 2 sets - 10 reps - Circular Shoulder Pendulum with Table Support (Mirrored)  - 3 x daily - 7 x weekly - 2 sets - 10 reps - Seated Scapular Retraction  - 3 x daily - 7 x weekly - 2 sets - 10 reps - Seated Shoulder Flexion Towel Slide at Table Top (Mirrored)  - 3 x daily - 7 x weekly -  10 reps - 10 second hold - Supine Shoulder Flexion AAROM with Hands Clasped  - 3 x daily - 7 x weekly - 3 sets - 10 reps - Supine Shoulder External Rotation with Dowel  - 3 x daily - 7 x weekly - 2 sets - 10 reps - Standing Isometric Shoulder Flexion with Doorway - Arm Bent  - 1 x daily - 7 x weekly - 2 sets - 10 reps - 3 seconds  hold - Standing Isometric Shoulder Abduction with Doorway - Arm Bent  - 1 x daily - 7 x weekly - 2 sets - 10 reps - 3 seconds  hold - Standing Isometric Shoulder Extension with Doorway - Arm Bent  - 1 x daily - 7 x weekly - 2 sets - 10 reps - 3 seconds  hold - Standing Isometric Shoulder External Rotation with Doorway  - 1 x daily - 7 x weekly - 2 sets - 10 reps - 3 seconds  hold   ASSESSMENT:  CLINICAL IMPRESSION: Patient with continued hyperactive/tender musculature but continued improved after limiting exercise and activity lately. Continued with manual with some decrease in tissue tension and symptoms. Continued with shoulder strengthening with emphasis on mechanics. Multimodial cueing for avoiding compensatory strategies throughout. Patient will continue to benefit from skilled physical therapy in order to improve function and reduce impairment.     EVAL: Patient a 84 y.o. y.o. male who was seen today for physical therapy evaluation and treatment for L reverse TSA DOS 03/27/24. Patient presents with pain limited deficits in L shoulder strength, ROM, endurance, activity tolerance, and functional mobility with ADL. Patient is having to modify and restrict ADL as indicated by outcome measure score as well as subjective information and objective measures which is affecting overall participation. Patient will benefit from skilled physical therapy in order to improve function and reduce impairment.  OBJECTIVE IMPAIRMENTS: decreased activity tolerance, decreased endurance, decreased mobility, decreased ROM, decreased strength, increased muscle spasms, impaired flexibility,  impaired UE functional use, improper body mechanics, and pain  ACTIVITY LIMITATIONS:  carrying, lifting, bending, sleeping, bed mobility, bathing, dressing, reach over head, hygiene/grooming, and caring for others  PARTICIPATION LIMITATIONS:  meal prep, cleaning, laundry, driving, shopping, community activity, and yard work  PERSONAL FACTORS: Age, Time since onset of injury/illness/exacerbation, and 1-2 comorbidities: DM, HLD are also affecting patient's functional outcome.   REHAB POTENTIAL: Good  CLINICAL DECISION MAKING: Evolving/moderate complexity  EVALUATION COMPLEXITY: Moderate  GOALS: Goals reviewed with patient? Yes  SHORT TERM GOALS: Target date: 05/11/2024    Patient will be independent with HEP in order to improve functional outcomes. Baseline: Goal status: MET 06/19/24  2.  Patient will report at least 25% improvement in symptoms for improved quality of life. Baseline:  Goal status: MET 06/19/24  3.  Patient will demonstrate Elevation to 130 deg and ER to 30 deg - passive, active assisted or active  Baseline:  Goal status: MET 04/20/24  4.  Patient will be out of sling per surgeon approval for improved functional use of L shoulder. Baseline:  Goal  status: MET 04/13/24   LONG TERM GOALS: Target date: 09/11/2024   Patient will report at least 75% improvement in symptoms for improved quality of life. Baseline:  Goal status: MET (65% 06/19/24)  2.  Patient will improve UEFS score by at least 25 points in order to indicate improved tolerance to activity. Baseline:  Goal status: MET 07/31/24  3.  Patient will demonstrate at least PROM/AAROM/AROM to: Elevation - 145-160; ER - 40-50 ; functional IR to L1 in L shoulder for improved ability lift overhead. Baseline:  Goal status: IN PROGRESS - partially met 07/31/24  4.  Patient will be able to return to all activities unrestricted for improved ability to perform yard work functions and participate with family.   Baseline:  Goal status: IN PROGRESS 07/31/24  5.  Patient will demonstrate grade of 5/5 MMT grade in all tested musculature as evidence of improved strength to assist with lifting at home. Baseline:  Goal status: IN PROGRESS 07/31/24   PLAN:  PT FREQUENCY: 1-2x/week  PT DURATION: 12 weeks  PLANNED INTERVENTIONS:[id number]- PT Re-evaluation, 97110-Therapeutic exercises, 97530- Therapeutic activity, 97112- Neuromuscular re-education, 97535- Self Care, [id number]- Manual therapy, [phone]- Gait training, [phone]- Orthotic Fit/training, [phone]- Canalith repositioning, [id number]- Aquatic Therapy, [phone]- Splinting, [phone]- Wound care (first 20 sq cm), 97598- Wound care (each additional 20 sq cm)Patient/Family education, Balance training, Stair training, Taping, Dry Needling, Joint mobilization, Joint manipulation, Spinal manipulation, Spinal mobilization, Scar mobilization, and DME instructions.  PLAN FOR NEXT SESSION: progress per protocol, progress PREs as appropriate/tolerated - WB for proximation.   Prentice RAMAN Embree Brawley, PT, DPT 08/23/2024, 8:45 AM    "

## 2024-08-29 ENCOUNTER — Ambulatory Visit (HOSPITAL_BASED_OUTPATIENT_CLINIC_OR_DEPARTMENT_OTHER): Admitting: Physical Therapy

## 2024-08-29 ENCOUNTER — Encounter (HOSPITAL_BASED_OUTPATIENT_CLINIC_OR_DEPARTMENT_OTHER): Payer: Self-pay | Admitting: Physical Therapy

## 2024-08-29 DIAGNOSIS — M6281 Muscle weakness (generalized): Secondary | ICD-10-CM

## 2024-08-29 DIAGNOSIS — M25512 Pain in left shoulder: Secondary | ICD-10-CM

## 2024-08-29 DIAGNOSIS — R29898 Other symptoms and signs involving the musculoskeletal system: Secondary | ICD-10-CM

## 2024-08-29 DIAGNOSIS — M25612 Stiffness of left shoulder, not elsewhere classified: Secondary | ICD-10-CM

## 2024-08-29 NOTE — Therapy (Signed)
 " OUTPATIENT PHYSICAL THERAPY SHOULDER TREATMENT NOTE   Patient Name: Greg Rogers. MRN: [id number] DOB:1940/09/26, 84 y.o., male Today's Date: 08/29/2024    END OF SESSION:  PT End of Session - 08/29/24 0721     Visit Number 34    Number of Visits 48    Date for Recertification  09/11/24    Authorization Type MCR    Progress Note Due on Visit 38    PT Start Time 0718    PT Stop Time 0758    PT Time Calculation (min) 40 min    Activity Tolerance Patient tolerated treatment well    Behavior During Therapy WFL for tasks assessed/performed          Past Medical History:  Diagnosis Date   Abnormal GGT test    With otherwise normal work up.   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)    Persistent disorder of initiating or maintaining sleep    Pes planus    Rosacea    Snoring    Past Surgical History:  Procedure Laterality Date   APPENDECTOMY  1949   at age 84   CERVICAL FUSION  03/2016   C 3&4   Cyst removed from breast  1960   FOOT SURGERY  01/2009   Left   HERNIA REPAIR  1956   at age 35 (single)   HERNIA REPAIR  1960   (double)   Left 2nd toe removed  03/2017   REVERSE SHOULDER ARTHROPLASTY Left 03/27/2024   Procedure: ARTHROPLASTY, SHOULDER, TOTAL, REVERSE;  Surgeon: Genelle Standing, MD;  Location: [facility];  Service: Orthopedics;  Laterality: Left;   SPINE SURGERY     TONSILLECTOMY AND ADENOIDECTOMY  ~ 1950   VASECTOMY  ~ 1984   Patient Active Problem List   Diagnosis Date Noted   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 03/27/2024   Leg pain 03/21/2024   Shoulder pain 12/07/2023   Change in stool 11/02/2023   Nasal lesion 06/02/2023   Occipital neuralgia 10/12/2020   Wound of right leg, initial encounter  07/03/2019   Healthcare maintenance 03/04/2017   Foot pain 07/22/2016   Cervical stenosis of spine 01/06/2016   thyroid  nodule-benign path on bx 01/2016 01/06/2016   Radicular pain in right arm 12/22/2015   Radicular leg pain 12/22/2015   Back pain 07/06/2015   Constipation 07/06/2015   FH: CAD (coronary artery disease) 05/23/2015   Family history of ischemic heart disease and other diseases of the circulatory system 05/23/2015   Advance care planning 11/26/2014   Pain in joint, shoulder region 11/26/2014   Medicare annual wellness visit, subsequent 09/27/2012   GERD (gastroesophageal reflux disease) 07/05/2012   Irritability 03/29/2011   Advance directive on file 01/09/2011   Pain in joint, other site 01/04/2011   Arthropathy 09/02/2010   NEPHROLITHIASIS, HX OF 09/02/2010   Diabetes mellitus without complication (HCC) 09/01/2010   HLD (hyperlipidemia) 10/24/2009   SNORING 10/24/2009   FOOT PAIN, BILATERAL 10/06/2009   Pes planus 10/06/2009   Acquired flat foot 10/06/2009    PCP: Cleatus Arlyss RAMAN, MD  REFERRING PROVIDER: Genelle Standing, MD  REFERRING DIAG: S46.012A (ICD-10-CM) - Traumatic complete tear of  left rotator cuff, initial encounter;  1. Left shoulder reverse shoulder arthroplasty 2. Left shoulder biceps tenodesis  THERAPY DIAG:  Acute pain of left shoulder  Stiffness of left shoulder, not elsewhere classified  Muscle weakness (generalized)  Other symptoms and signs involving the musculoskeletal system  Rationale for Evaluation and Treatment: Rehabilitation  ONSET DATE: DOS 03/27/24  SUBJECTIVE:                                                                                                                                                                                      SUBJECTIVE STATEMENT: Patient states continues to tone back on exercises, doing something every day though. Using tennis ball on knot, getting better.   EVAL: Patient states shoulder  has been doing good. Using sling. Was very active prior to injury/surgery with gardening/yard work Hand dominance: Right  PERTINENT HISTORY: DM, HLD, chronic shoulder pain with fall in March while exiting ski lift with RC tear  PAIN:  Are you having pain? Yes 0/10 Location: anterior Lt shoulder/upper arm Description: dull, uncomfortable Worsened by: raising arm to front Relieved by:  massage, icy hot      PRECAUTIONS: Other: L reverse TSA   RED FLAGS: None   WEIGHT BEARING RESTRICTIONS: NWB LUE at eval  FALLS:  Has patient fallen in last 6 months? Yes. Number of falls 1  PLOF: Independent  PATIENT GOALS:regain use of the arm  OBJECTIVE: (objective measures from initial evaluation unless otherwise dated)  OBSERVATION: using sling, waterproof dressing intact  PATIENT SURVEYS:  UEFS  Extreme difficulty/unable (0), Quite a bit of difficulty (1), Moderate difficulty (2), Little difficulty (3), No difficulty (4) Survey date:  03/30/24 06/19/24 07/31/24  Any of your usual work, household or school activities 0  3  2. Your usual hobbies, recreational/sport activities 1  2   3. Lifting a bag of groceries to waist level 2  3   4. Lifting a bag of groceries above your head 0  1  5. Grooming your hair 4  4  6. Pushing up on your hands (I.e. from bathtub or chair) 0  3  7. Preparing food (I.e. peeling/cutting) 1  4  8. Driving  3  4  9. Vacuuming, sweeping, or raking 4  4  10. Dressing  3  4  11. Doing up buttons 2  3  12. Using tools/appliances 1  4  13. Opening doors 0  3  14. Cleaning  3  4  15. Tying or lacing shoes 2  4  16. Sleeping  3  4  17. Laundering clothes (I.e. washing, ironing, folding) 3  4  18. Opening a jar  3  3  19. Throwing a ball 1  3  20. Carrying a small suitcase with your affected limb.  1  4  Score total:  35/80 58/80 68/80      05/08/24- UEFS 15/80 via online version, later noted questions were different than one in epic   COGNITION: Overall  cognitive status: Within functional limits for tasks assessed     SENSATION: WFL  POSTURE: rounded shoulders and forward head   UPPER EXTREMITY ROM: AROM RUE WFL  Passive ROM Left eval L 04/06/24 L 04/13/24 L 04/20/24 L 04/24/24 L 04/27/24 L 9/17 L  9/19 L 9/26 L 06/01/24 L 06/05/24 L 06/07/24 L 06/19/24 L 07/27/24 L 07/31/24 L 08/07/24 Left 08/17/24 Left 08/23/24 Left 08/29/24  Shoulder flexion 100 PROM 110 AAROM 110* PROM, 120* AAROM supine  120-130* PROM (bit guarded today) 130* PROM and AROM  114 AROM 140 AAROM 145 AAROM 138 AAROM Improves to 149 AAROM 139 AAROM 148 AAROM improves to 153 AAROM 144* AAROM  130 degrees on shoulder flexion ladder  120 before manual, 135 after in supine, 140 on shoulder flexion ladder  135 deg before manual 140  130 AAROM against gravity  149 AAROM 145 AAROM AROM 90 - limited by strength 140 AAROM Improves to 146 AAROM with manual  147 AAROM 142 AAROM 145 AAROM  Shoulder extension                     Shoulder abduction                     Shoulder adduction                     Shoulder internal rotation         Mid glute. L5 at best with IR stretch     L5  L4      Shoulder external rotation 20 25* next to body PROM setaed (very guarded), 30* AAROM seated  30* PROM arm next to body  30* PROM arm next to body      30* PROM/AAROM supine arm next to body but limited by anterior shoulder pain     60 deg   C7      Elbow flexion                     Elbow extension                     Wrist flexion                     Wrist extension                     Wrist ulnar deviation                     Wrist radial deviation                     Wrist pronation                     Wrist supination                     (Blank rows = not tested) *=pain/symptoms  UPPER EXTREMITY MMT: NT due to post op status  MMT Right eval Left eval Left 05/11/24 Left 06/19/24 Right 07/31/24 Left 07/31/24  Shoulder flexion  3 UT compensations  3 4 3   Shoulder extension         Shoulder abduction   3 UT compensations  3 4 3   Shoulder adduction        Shoulder internal rotation    4- 5 4-  Shoulder external rotation    4- 4 4-  Middle trapezius        Lower trapezius        Elbow flexion        Elbow extension        Wrist flexion        Wrist extension        Wrist ulnar deviation        Wrist radial deviation        Wrist pronation        Wrist supination        Grip strength (lbs)        (Blank rows = not tested) *=pain/symptoms    PALPATION:  LUE gross edema                                                                                                       TODAY'S TREATMENT:                                                                                                                                         DATE:  08/29/24 Pulley flexion/scaption 3 minutes Supine AAROM flexion 2 x 10 Manual: STM to deltoid, teres minor/major, lats, UT, pec Lat pull down machine 10# 2 x 10 Scap retraction-depression 2 x 10 Supine AAROM flexion with ret-dep 1 x 10 Sidelying shoulder abduction 2 x 10 Standing wall slide with scap ret-depression 2 x 10  08/24/23 Manual: STM to deltoid, teres minor/major, lats, UT, pec Shoulder flexion AAROM supine 2# bar 2 x 10, on wedge 2# bar 2x 10 Sidelying shoulder ER 2# 2 x 10 Bent shoulder extension 2# 2 x 10 Standing IR GTB 3 x 10  Standing elbow extension GTB 2x 10  08/18/23 Manual: STM to deltoid, teres minor/major, lats, UT, pec Shoulder flexion AAROM supine 2 x 10 Sidelying shoulder abduction 2 x 10 Bent shoulder extension 2 x 10 Shoulder isometrics flexion, abduction, extension 5 x 10 second holds    08/14/24 Manual: STM to deltoid, teres minor/major, lats, UT, pec Shoulder flexion AAROM supine 2 x 10    08/07/24 Manual: STM to deltoid, teres minor/major, lats, UT; pin and  stretch teres, lats into flexion, pin and stretch pec Shoulder flexion wall slides with lift off 1 x 8 Standing ER stretch at door  3 x 20 second holds Functional IR stretch with strap 5 x 10 second holds Shoulder flexion with loop band at wall 1 x 10 Bend over shoulder flexion 1 x 10 Supine shoulder Flexion D2 RTB 1 x 5 - difficult today      PATIENT EDUCATION:  Education details:  HEP, relevant anatomy/biomechanics, protocol/precautions. 07/31/24: reassessment findings, progress made, POC, HEP Person educated: Patient Education method: Explanation, Demonstration,  Education comprehension: verbalized understanding, returned demonstration, verbal cues required, and tactile cues required  HOME EXERCISE PROGRAM:  Access Code: [email or web address] URL: [email or web address] Date: 04/13/2024 Prepared by: Josette Rough  Exercises - Wrist AROM Flexion Extension  - 3 x daily - 7 x weekly - 2 sets - 10 reps - Seated Elbow Flexion and Extension AROM (Mirrored)  - 3 x daily - 7 x weekly - 2 sets - 10 reps - Circular Shoulder Pendulum with Table Support (Mirrored)  - 3 x daily - 7 x weekly - 2 sets - 10 reps - Seated Scapular Retraction  - 3 x daily - 7 x weekly - 2 sets - 10 reps - Seated Shoulder Flexion Towel Slide at Table Top (Mirrored)  - 3 x daily - 7 x weekly - 10 reps - 10 second hold - Supine Shoulder Flexion AAROM with Hands Clasped  - 3 x daily - 7 x weekly - 3 sets - 10 reps - Supine Shoulder External Rotation with Dowel  - 3 x daily - 7 x weekly - 2 sets - 10 reps - Standing Isometric Shoulder Flexion with Doorway - Arm Bent  - 1 x daily - 7 x weekly - 2 sets - 10 reps - 3 seconds  hold - Standing Isometric Shoulder Abduction with Doorway - Arm Bent  - 1 x daily - 7 x weekly - 2 sets - 10 reps - 3 seconds  hold - Standing Isometric Shoulder Extension with Doorway - Arm Bent  - 1 x daily - 7 x weekly - 2 sets - 10 reps - 3 seconds  hold - Standing Isometric Shoulder External Rotation with Doorway  - 1 x daily - 7 x weekly - 2 sets - 10 reps - 3 seconds  hold   ASSESSMENT:  CLINICAL IMPRESSION: Patient  with improving ROM and decreasing tissue tension in L shoulder. Decrease in tissue tension with manual. Worked into scapular mechanics and motor control exercises today. Multimodial cueing for avoiding compensatory strategies throughout. Patient will continue to benefit from skilled physical therapy in order to improve function and reduce impairment.     EVAL: Patient a 84 y.o. y.o. male who was seen today for physical therapy evaluation and treatment for L reverse TSA DOS 03/27/24. Patient presents with pain limited deficits in L shoulder strength, ROM, endurance, activity tolerance, and functional mobility with ADL. Patient is having to modify and restrict ADL as indicated by outcome measure score as well as subjective information and objective measures which is affecting overall participation. Patient will benefit from skilled physical therapy in order to improve function and reduce impairment.  OBJECTIVE IMPAIRMENTS: decreased activity tolerance, decreased endurance, decreased mobility, decreased ROM, decreased strength, increased muscle spasms, impaired flexibility, impaired UE functional use, improper body mechanics, and pain  ACTIVITY LIMITATIONS:  carrying, lifting, bending, sleeping, bed mobility, bathing, dressing, reach over head, hygiene/grooming, and caring for others  PARTICIPATION LIMITATIONS:  meal prep, cleaning, laundry, driving, shopping, community activity, and yard work  PERSONAL FACTORS: Age, Time since onset of injury/illness/exacerbation, and 1-2 comorbidities: DM, HLD are also affecting patient's functional outcome.   REHAB POTENTIAL: Good  CLINICAL DECISION MAKING: Evolving/moderate complexity  EVALUATION COMPLEXITY: Moderate  GOALS: Goals reviewed with patient? Yes  SHORT TERM GOALS: Target date: 05/11/2024    Patient will be independent with HEP in order to improve functional outcomes. Baseline: Goal status: MET 06/19/24  2.  Patient will report at least 25%  improvement in symptoms for improved quality of life. Baseline:  Goal status: MET 06/19/24  3.  Patient will demonstrate Elevation to 130 deg and ER to 30 deg - passive, active assisted or active  Baseline:  Goal status: MET 04/20/24  4.  Patient will be out of sling per surgeon approval for improved functional use of L shoulder. Baseline:  Goal status: MET 04/13/24   LONG TERM GOALS: Target date: 09/11/2024   Patient will report at least 75% improvement in symptoms for improved quality of life. Baseline:  Goal status: MET (65% 06/19/24)  2.  Patient will improve UEFS score by at least 25 points in order to indicate improved tolerance to activity. Baseline:  Goal status: MET 07/31/24  3.  Patient will demonstrate at least PROM/AAROM/AROM to: Elevation - 145-160; ER - 40-50 ; functional IR to L1 in L shoulder for improved ability lift overhead. Baseline:  Goal status: IN PROGRESS - partially met 07/31/24  4.  Patient will be able to return to all activities unrestricted for improved ability to perform yard work functions and participate with family.  Baseline:  Goal status: IN PROGRESS 07/31/24  5.  Patient will demonstrate grade of 5/5 MMT grade in all tested musculature as evidence of improved strength to assist with lifting at home. Baseline:  Goal status: IN PROGRESS 07/31/24   PLAN:  PT FREQUENCY: 1-2x/week  PT DURATION: 12 weeks  PLANNED INTERVENTIONS:[id number]- PT Re-evaluation, 97110-Therapeutic exercises, 97530- Therapeutic activity, 97112- Neuromuscular re-education, 97535- Self Care, [id number]- Manual therapy, [phone]- Gait training, [phone]- Orthotic Fit/training, [phone]- Canalith repositioning, [id number]- Aquatic Therapy, [phone]- Splinting, [phone]- Wound care (first 20 sq cm), 97598- Wound care (each additional 20 sq cm)Patient/Family education, Balance training, Stair training, Taping, Dry Needling, Joint mobilization, Joint manipulation, Spinal manipulation, Spinal mobilization, Scar  mobilization, and DME instructions.  PLAN FOR NEXT SESSION: progress per protocol, progress PREs as appropriate/tolerated - WB for proximation.   Prentice RAMAN Jamez Ambrocio, PT, DPT 08/29/2024, 8:00 AM    "

## 2024-08-31 ENCOUNTER — Ambulatory Visit (HOSPITAL_BASED_OUTPATIENT_CLINIC_OR_DEPARTMENT_OTHER): Admitting: Physical Therapy

## 2024-08-31 ENCOUNTER — Encounter (HOSPITAL_BASED_OUTPATIENT_CLINIC_OR_DEPARTMENT_OTHER): Payer: Self-pay | Admitting: Physical Therapy

## 2024-08-31 DIAGNOSIS — M25612 Stiffness of left shoulder, not elsewhere classified: Secondary | ICD-10-CM

## 2024-08-31 DIAGNOSIS — M6281 Muscle weakness (generalized): Secondary | ICD-10-CM

## 2024-08-31 DIAGNOSIS — M25512 Pain in left shoulder: Secondary | ICD-10-CM

## 2024-08-31 DIAGNOSIS — R29898 Other symptoms and signs involving the musculoskeletal system: Secondary | ICD-10-CM

## 2024-08-31 NOTE — Therapy (Signed)
 " OUTPATIENT PHYSICAL THERAPY SHOULDER TREATMENT NOTE   Patient Name: Greg Rogers. MRN: [id number] DOB:09-Dec-1940, 84 y.o., male Today's Date: 08/31/2024  Progress Note   Reporting Period 07/31/24 to 08/31/24   See note below for Objective Data and Assessment of Progress/Goals    END OF SESSION:  PT End of Session - 08/31/24 0758     Visit Number 35    Number of Visits 64    Date for Recertification  10/26/24    Authorization Type MCR    Progress Note Due on Visit 45    PT Start Time 0759    PT Stop Time 0845    PT Time Calculation (min) 46 min    Activity Tolerance Patient tolerated treatment well    Behavior During Therapy WFL for tasks assessed/performed          Past Medical History:  Diagnosis Date   Abnormal GGT test    With otherwise normal work up.   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)    Persistent disorder of initiating or maintaining sleep    Pes planus    Rosacea    Snoring    Past Surgical History:  Procedure Laterality Date   APPENDECTOMY  1949   at age 77   CERVICAL FUSION  03/2016   C 3&4   Cyst removed from breast  1960   FOOT SURGERY  01/2009   Left   HERNIA REPAIR  1956   at age 68 (single)   HERNIA REPAIR  1960   (double)   Left 2nd toe removed  03/2017   REVERSE SHOULDER ARTHROPLASTY Left 03/27/2024   Procedure: ARTHROPLASTY, SHOULDER, TOTAL, REVERSE;  Surgeon: Genelle Standing, MD;  Location: [facility];  Service: Orthopedics;  Laterality: Left;   SPINE SURGERY     TONSILLECTOMY AND ADENOIDECTOMY  ~ 1950   VASECTOMY  ~ 1984   Patient Active Problem List   Diagnosis Date Noted   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 03/27/2024   Leg pain 03/21/2024   Shoulder pain 12/07/2023    Change in stool 11/02/2023   Nasal lesion 06/02/2023   Occipital neuralgia 10/12/2020   Wound of right leg, initial encounter 07/03/2019   Healthcare maintenance 03/04/2017   Foot pain 07/22/2016   Cervical stenosis of spine 01/06/2016   thyroid  nodule-benign path on bx 01/2016 01/06/2016   Radicular pain in right arm 12/22/2015   Radicular leg pain 12/22/2015   Back pain 07/06/2015   Constipation 07/06/2015   FH: CAD (coronary artery disease) 05/23/2015   Family history of ischemic heart disease and other diseases of the circulatory system 05/23/2015   Advance care planning 11/26/2014   Pain in joint, shoulder region 11/26/2014   Medicare annual wellness visit, subsequent 09/27/2012   GERD (gastroesophageal reflux disease) 07/05/2012   Irritability 03/29/2011   Advance directive on file 01/09/2011   Pain in joint, other site 01/04/2011   Arthropathy 09/02/2010   NEPHROLITHIASIS, HX OF 09/02/2010   Diabetes mellitus without complication (HCC) 09/01/2010   HLD (hyperlipidemia) 10/24/2009   SNORING 10/24/2009   FOOT PAIN, BILATERAL 10/06/2009   Pes planus 10/06/2009   Acquired flat foot 10/06/2009  PCP: Cleatus Arlyss RAMAN, MD  REFERRING PROVIDER: Genelle Standing, MD  REFERRING DIAG: [phone] (ICD-10-CM) - 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, initial encounter;  1. Left shoulder reverse shoulder arthroplasty 2. Left shoulder biceps tenodesis  THERAPY DIAG:  Acute pain of left shoulder  Stiffness of left shoulder, not elsewhere classified  Muscle weakness (generalized)  Other symptoms and signs involving the musculoskeletal system  Rationale for Evaluation and Treatment: Rehabilitation  ONSET DATE: DOS 03/27/24  SUBJECTIVE:                                                                                                                                                                                      SUBJECTIVE STATEMENT: Patient states felt good after last  time, felt like its overall been better the last few days. Feels like the knot is getting better. HEP is going well. Patient states about 75% improvement/functional status.  EVAL: Patient states shoulder has been doing good. Using sling. Was very active prior to injury/surgery with gardening/yard work Hand dominance: Right  PERTINENT HISTORY: DM, HLD, chronic shoulder pain with fall in March while exiting ski lift with RC tear  PAIN:  Are you having pain? Yes 0/10 Location: anterior Lt shoulder/upper arm Description: dull, uncomfortable Worsened by: raising arm to front Relieved by:  massage, icy hot      PRECAUTIONS: Other: L reverse TSA   RED FLAGS: None   WEIGHT BEARING RESTRICTIONS: NWB LUE at eval  FALLS:  Has patient fallen in last 6 months? Yes. Number of falls 1  PLOF: Independent  PATIENT GOALS:regain use of the arm  OBJECTIVE: (objective measures from initial evaluation unless otherwise dated)  OBSERVATION: using sling, waterproof dressing intact  PATIENT SURVEYS:  UEFS  Extreme difficulty/unable (0), Quite a bit of difficulty (1), Moderate difficulty (2), Little difficulty (3), No difficulty (4) Survey date:  03/30/24 06/19/24 07/31/24 08/31/24  Any of your usual work, household or school activities 0  3 3  2. Your usual hobbies, recreational/sport activities 1  2 2    3. Lifting a bag of groceries to waist level 2  3 4    4. Lifting a bag of groceries above your head 0  1 2  5. Grooming your hair 4  4 3   6. Pushing up on your hands (I.e. from bathtub or chair) 0  3 4  7. Preparing food (I.e. peeling/cutting) 1  4 4   8. Driving  3  4 4   9. Vacuuming, sweeping, or raking 4  4 3   10. Dressing  3  4 4   11. Doing up buttons 2  3 3   12. Using tools/appliances 1  4  3  13. Opening doors 0  3 3  14. Cleaning  3  4 3   15. Tying or lacing shoes 2  4 4   16. Sleeping  3  4 4   17. Laundering clothes (I.e. washing, ironing, folding) 3  4 4   18. Opening a jar 3  3 2    19. Throwing a ball 1  3 1   20. Carrying a small suitcase with your affected limb.  1  4 3   Score total:  35/80 58/80 68/80  63/80     05/08/24- UEFS 15/80 via online version, later noted questions were different than one in epic   COGNITION: Overall cognitive status: Within functional limits for tasks assessed     SENSATION: WFL  POSTURE: rounded shoulders and forward head   UPPER EXTREMITY ROM: AROM RUE WFL  Passive ROM Left eval L 04/06/24 L 04/13/24 L 04/20/24 L 04/24/24 L 04/27/24 L 9/17 L  9/19 L 9/26 L 06/01/24 L 06/05/24 L 06/07/24 L 06/19/24 L 07/27/24 L 07/31/24 L 08/07/24 Left 08/17/24 Left 08/23/24 Left 08/29/24 Right 08/31/24 Left 08/31/24  Shoulder flexion 100 PROM 110 AAROM 110* PROM, 120* AAROM supine  120-130* PROM (bit guarded today) 130* PROM and AROM  114 AROM 140 AAROM 145 AAROM 138 AAROM Improves to 149 AAROM 139 AAROM 148 AAROM improves to 153 AAROM 144* AAROM  130 degrees on shoulder flexion ladder  120 before manual, 135 after in supine, 140 on shoulder flexion ladder  135 deg before manual 140  130 AAROM against gravity  149 AAROM 145 AAROM AROM 90 - limited by strength 140 AAROM Improves to 146 AAROM with manual  147 AAROM 142 AAROM 145 AAROM 154 AROM 95 AROM 147 AAROM  Shoulder extension                       Shoulder abduction                    156 AROM 68 AROM with hike  Shoulder adduction                       Shoulder internal rotation         Mid glute. L5 at best with IR stretch     L5  L4     L1 L3  Shoulder external rotation 20 25* next to body PROM setaed (very guarded), 30* AAROM seated  30* PROM arm next to body  30* PROM arm next to body      30* PROM/AAROM supine arm next to body but limited by anterior shoulder pain     60 deg   C7     C7 C6  Elbow flexion                       Elbow extension                       Wrist flexion                       Wrist extension                       Wrist ulnar deviation                       Wrist  radial deviation  Wrist pronation                       Wrist supination                       (Blank rows = not tested) *=pain/symptoms  UPPER EXTREMITY MMT: NT due to post op status  MMT Right eval Left eval Left 05/11/24 Left 06/19/24 Right 07/31/24 Left 07/31/24 Right 08/31/24 Left 08/31/24  Shoulder flexion   3 UT compensations  3 4 3 4  3-  Shoulder extension          Shoulder abduction   3 UT compensations  3 4 3 4  3-  Shoulder adduction          Shoulder internal rotation    4- 5 4- 5 4+  Shoulder external rotation    4- 4 4- 4+ 4  Middle trapezius          Lower trapezius          Elbow flexion          Elbow extension          Wrist flexion          Wrist extension          Wrist ulnar deviation          Wrist radial deviation          Wrist pronation          Wrist supination          Grip strength (lbs)          (Blank rows = not tested) *=pain/symptoms    PALPATION:  LUE gross edema 08/31/24: hyperactive grossly throughout shoulder with trigger points in deltoid                                                                                                       TODAY'S TREATMENT:                                                                                                                                         DATE:  08/31/24 Pulley flexion/scaption 3 minutes Reassessment Manual: STM to deltoid, teres minor/major, lats, UT, pec, LS; pin and stretch pec and LS Supine AAROM flexion with ret-dep with RTB in hands 1 x 10 Standing Row BTB 2 x 10 Standing wall wash circles 5 x 5 circles UBE retro level 5.7, 3  minutes  08/29/24 Pulley flexion/scaption 3 minutes Supine AAROM flexion 2 x 10 Manual: STM to deltoid, teres minor/major, lats, UT, pec Lat pull down machine 10# 2 x 10 Scap retraction-depression 2 x 10 Supine AAROM flexion with ret-dep 1 x 10 Sidelying shoulder abduction 2 x 10 Standing wall slide with scap ret-depression 2 x  10  08/24/23 Manual: STM to deltoid, teres minor/major, lats, UT, pec Shoulder flexion AAROM supine 2# bar 2 x 10, on wedge 2# bar 2x 10 Sidelying shoulder ER 2# 2 x 10 Bent shoulder extension 2# 2 x 10 Standing IR GTB 3 x 10  Standing elbow extension GTB 2x 10  08/18/23 Manual: STM to deltoid, teres minor/major, lats, UT, pec Shoulder flexion AAROM supine 2 x 10 Sidelying shoulder abduction 2 x 10 Bent shoulder extension 2 x 10 Shoulder isometrics flexion, abduction, extension 5 x 10 second holds    08/14/24 Manual: STM to deltoid, teres minor/major, lats, UT, pec Shoulder flexion AAROM supine 2 x 10    08/07/24 Manual: STM to deltoid, teres minor/major, lats, UT; pin and stretch teres, lats into flexion, pin and stretch pec Shoulder flexion wall slides with lift off 1 x 8 Standing ER stretch at door 3 x 20 second holds Functional IR stretch with strap 5 x 10 second holds Shoulder flexion with loop band at wall 1 x 10 Bend over shoulder flexion 1 x 10 Supine shoulder Flexion D2 RTB 1 x 5 - difficult today      PATIENT EDUCATION:  Education details:  HEP, relevant anatomy/biomechanics, protocol/precautions. 07/31/24: reassessment findings, progress made, POC, HEP 08/31/24: reassessment findings, progress made, POC, HEP Person educated: Patient Education method: Explanation, Demonstration,  Education comprehension: verbalized understanding, returned demonstration, verbal cues required, and tactile cues required  HOME EXERCISE PROGRAM:  Access Code: [email or web address] URL: [email or web address] Date: 04/13/2024 Prepared by: Josette Rough  Exercises - Wrist AROM Flexion Extension  - 3 x daily - 7 x weekly - 2 sets - 10 reps - Seated Elbow Flexion and Extension AROM (Mirrored)  - 3 x daily - 7 x weekly - 2 sets - 10 reps - Circular Shoulder Pendulum with Table Support (Mirrored)  - 3 x daily - 7 x weekly - 2 sets - 10 reps - Seated Scapular Retraction  - 3 x daily - 7  x weekly - 2 sets - 10 reps - Seated Shoulder Flexion Towel Slide at Table Top (Mirrored)  - 3 x daily - 7 x weekly - 10 reps - 10 second hold - Supine Shoulder Flexion AAROM with Hands Clasped  - 3 x daily - 7 x weekly - 3 sets - 10 reps - Supine Shoulder External Rotation with Dowel  - 3 x daily - 7 x weekly - 2 sets - 10 reps - Standing Isometric Shoulder Flexion with Doorway - Arm Bent  - 1 x daily - 7 x weekly - 2 sets - 10 reps - 3 seconds  hold - Standing Isometric Shoulder Abduction with Doorway - Arm Bent  - 1 x daily - 7 x weekly - 2 sets - 10 reps - 3 seconds  hold - Standing Isometric Shoulder Extension with Doorway - Arm Bent  - 1 x daily - 7 x weekly - 2 sets - 10 reps - 3 seconds  hold - Standing Isometric Shoulder External Rotation with Doorway  - 1 x daily - 7 x weekly - 2 sets - 10 reps - 3 seconds  hold   ASSESSMENT:  CLINICAL IMPRESSION: Patient has met 4/4 short term goals and 2/5 long term goals with ability to complete HEP and improvement in symptoms, strength, ROM, activity tolerance,  and functional mobility. Remaining goals not met due to continued deficits in L shoulder ROM, strength, activity tolerance, and functional mobility. Patient continues to make good but slow progress toward remaining goals. Cueing throughout session for mechanics and proper motor control.   Patient will continue to benefit from skilled physical therapy in order to improve function and reduce impairment.      EVAL: Patient a 84 y.o. y.o. male who was seen today for physical therapy evaluation and treatment for L reverse TSA DOS 03/27/24. Patient presents with pain limited deficits in L shoulder strength, ROM, endurance, activity tolerance, and functional mobility with ADL. Patient is having to modify and restrict ADL as indicated by outcome measure score as well as subjective information and objective measures which is affecting overall participation. Patient will benefit from skilled physical  therapy in order to improve function and reduce impairment.  OBJECTIVE IMPAIRMENTS: decreased activity tolerance, decreased endurance, decreased mobility, decreased ROM, decreased strength, increased muscle spasms, impaired flexibility, impaired UE functional use, improper body mechanics, and pain  ACTIVITY LIMITATIONS:  carrying, lifting, bending, sleeping, bed mobility, bathing, dressing, reach over head, hygiene/grooming, and caring for others  PARTICIPATION LIMITATIONS:  meal prep, cleaning, laundry, driving, shopping, community activity, and yard work  PERSONAL FACTORS: Age, Time since onset of injury/illness/exacerbation, and 1-2 comorbidities: DM, HLD are also affecting patient's functional outcome.   REHAB POTENTIAL: Good  CLINICAL DECISION MAKING: Evolving/moderate complexity  EVALUATION COMPLEXITY: Moderate  GOALS: Goals reviewed with patient? Yes  SHORT TERM GOALS: Target date: 05/11/2024    Patient will be independent with HEP in order to improve functional outcomes. Baseline: Goal status: MET 06/19/24  2.  Patient will report at least 25% improvement in symptoms for improved quality of life. Baseline:  Goal status: MET 06/19/24  3.  Patient will demonstrate Elevation to 130 deg and ER to 30 deg - passive, active assisted or active  Baseline:  Goal status: MET 04/20/24  4.  Patient will be out of sling per surgeon approval for improved functional use of L shoulder. Baseline:  Goal status: MET 04/13/24   LONG TERM GOALS: Target date: 09/11/2024   Patient will report at least 75% improvement in symptoms for improved quality of life. Baseline:  Goal status: MET (65% 06/19/24)  2.  Patient will improve UEFS score by at least 25 points in order to indicate improved tolerance to activity. Baseline:  Goal status: MET 07/31/24  3.  Patient will demonstrate at least PROM/AAROM/AROM to: Elevation - 145-160; ER - 40-50 ; functional IR to L1 in L shoulder for improved ability  lift overhead. Baseline:  Goal status: IN PROGRESS - partially met 07/31/24  4.  Patient will be able to return to all activities unrestricted for improved ability to perform yard work functions and participate with family.  Baseline:  Goal status: IN PROGRESS 07/31/24  5.  Patient will demonstrate grade of 5/5 MMT grade in all tested musculature as evidence of improved strength to assist with lifting at home. Baseline:  Goal status: IN PROGRESS 07/31/24   PLAN:  PT FREQUENCY: 1-2x/week  PT DURATION: 8 weeks  PLANNED INTERVENTIONS:[id number]- PT Re-evaluation, 97110-Therapeutic exercises, 97530- Therapeutic activity, [id number]- Neuromuscular re-education, 97535- Self Care, [id number]- Manual therapy, [id number]- Gait training, [phone]- Orthotic Fit/training, [phone]- Canalith repositioning, [id number]- Aquatic Therapy, 97760- Splinting, [id number]- Wound care (first 20  sq cm), 97598- Wound care (each additional 20 sq cm)Patient/Family education, Balance training, Stair training, Taping, Dry Needling, Joint mobilization, Joint manipulation, Spinal manipulation, Spinal mobilization, Scar mobilization, and DME instructions.  PLAN FOR NEXT SESSION: progress per protocol, progress PREs as appropriate/tolerated - WB for proximation.   Prentice RAMAN Keyuana Wank, PT, DPT 08/31/2024, 8:47 AM    "

## 2024-09-05 ENCOUNTER — Ambulatory Visit (HOSPITAL_BASED_OUTPATIENT_CLINIC_OR_DEPARTMENT_OTHER): Payer: Self-pay | Admitting: Physical Therapy

## 2024-09-05 ENCOUNTER — Encounter (HOSPITAL_BASED_OUTPATIENT_CLINIC_OR_DEPARTMENT_OTHER): Payer: Self-pay | Admitting: Physical Therapy

## 2024-09-05 DIAGNOSIS — M6281 Muscle weakness (generalized): Secondary | ICD-10-CM

## 2024-09-05 DIAGNOSIS — M25512 Pain in left shoulder: Secondary | ICD-10-CM

## 2024-09-05 DIAGNOSIS — M25612 Stiffness of left shoulder, not elsewhere classified: Secondary | ICD-10-CM

## 2024-09-05 NOTE — Therapy (Signed)
 " OUTPATIENT PHYSICAL THERAPY SHOULDER TREATMENT NOTE   Patient Name: Greg Rogers. MRN: [id number] DOB:1941/01/25, 84 y.o., male Today's Date: 09/05/2024   END OF SESSION:  PT End of Session - 09/05/24 1522     Visit Number 36    Number of Visits 64    Date for Recertification  10/26/24    Authorization Type MCR    Progress Note Due on Visit 45    PT Start Time 1519    PT Stop Time 1557    PT Time Calculation (min) 38 min          Past Medical History:  Diagnosis Date   Abnormal GGT test    With otherwise normal work up.   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)    Persistent disorder of initiating or maintaining sleep    Pes planus    Rosacea    Snoring    Past Surgical History:  Procedure Laterality Date   APPENDECTOMY  1949   at age 38   CERVICAL FUSION  03/2016   C 3&4   Cyst removed from breast  1960   FOOT SURGERY  01/2009   Left   HERNIA REPAIR  1956   at age 26 (single)   HERNIA REPAIR  1960   (double)   Left 2nd toe removed  03/2017   REVERSE SHOULDER ARTHROPLASTY Left 03/27/2024   Procedure: ARTHROPLASTY, SHOULDER, TOTAL, REVERSE;  Surgeon: Genelle Standing, MD;  Location: [facility];  Service: Orthopedics;  Laterality: Left;   SPINE SURGERY     TONSILLECTOMY AND ADENOIDECTOMY  ~ 1950   VASECTOMY  ~ 1984   Patient Active Problem List   Diagnosis Date Noted   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 03/27/2024   Leg pain 03/21/2024   Shoulder pain 12/07/2023   Change in stool 11/02/2023   Nasal lesion 06/02/2023   Occipital neuralgia 10/12/2020   Wound of right leg, initial encounter 07/03/2019   Healthcare maintenance 03/04/2017   Foot pain 07/22/2016   Cervical stenosis of spine 01/06/2016    thyroid  nodule-benign path on bx 01/2016 01/06/2016   Radicular pain in right arm 12/22/2015   Radicular leg pain 12/22/2015   Back pain 07/06/2015   Constipation 07/06/2015   FH: CAD (coronary artery disease) 05/23/2015   Family history of ischemic heart disease and other diseases of the circulatory system 05/23/2015   Advance care planning 11/26/2014   Pain in joint, shoulder region 11/26/2014   Medicare annual wellness visit, subsequent 09/27/2012   GERD (gastroesophageal reflux disease) 07/05/2012   Irritability 03/29/2011   Advance directive on file 01/09/2011   Pain in joint, other site 01/04/2011   Arthropathy 09/02/2010   NEPHROLITHIASIS, HX OF 09/02/2010   Diabetes mellitus without complication (HCC) 09/01/2010   HLD (hyperlipidemia) 10/24/2009   SNORING 10/24/2009   FOOT PAIN, BILATERAL 10/06/2009   Pes planus 10/06/2009   Acquired flat foot 10/06/2009    PCP: Cleatus Arlyss RAMAN, MD  REFERRING PROVIDER: Genelle Standing, MD  REFERRING DIAG: S46.012A (ICD-10-CM) - 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, initial encounter;  1. Left shoulder reverse shoulder arthroplasty 2. Left shoulder biceps tenodesis  THERAPY DIAG:  Acute pain of left shoulder  Stiffness of left shoulder, not elsewhere classified  Muscle weakness (generalized)  Rationale for Evaluation and Treatment: Rehabilitation  ONSET DATE: DOS 03/27/24  SUBJECTIVE:                                                                                                                                                                                      SUBJECTIVE STATEMENT: Patient reports that he is leaving on skiing trip this weekend.  Feels like the Lt shoulder knot is getting better. I'm interested in getting stronger.  EVAL: Patient states shoulder has been doing good. Using sling. Was very active prior to injury/surgery with gardening/yard work Hand dominance: Right  PERTINENT HISTORY: DM, HLD,  chronic shoulder pain with fall in March while exiting ski lift with RC tear  PAIN:  Are you having pain? no 0/10 Location: anterior Lt shoulder/upper arm Description:  Worsened by: raising arm to front Relieved by:  massage, icy hot      PRECAUTIONS: Other: L reverse TSA   RED FLAGS: None   WEIGHT BEARING RESTRICTIONS: NWB LUE at eval  FALLS:  Has patient fallen in last 6 months? Yes. Number of falls 1  PLOF: Independent  PATIENT GOALS:regain use of the arm  OBJECTIVE: (objective measures from initial evaluation unless otherwise dated)  OBSERVATION: using sling, waterproof dressing intact  PATIENT SURVEYS:  UEFS  Extreme difficulty/unable (0), Quite a bit of difficulty (1), Moderate difficulty (2), Little difficulty (3), No difficulty (4) Survey date:  03/30/24 06/19/24 07/31/24 08/31/24  Any of your usual work, household or school activities 0  3 3  2. Your usual hobbies, recreational/sport activities 1  2 2    3. Lifting a bag of groceries to waist level 2  3 4    4. Lifting a bag of groceries above your head 0  1 2  5. Grooming your hair 4  4 3   6. Pushing up on your hands (I.e. from bathtub or chair) 0  3 4  7. Preparing food (I.e. peeling/cutting) 1  4 4   8. Driving  3  4 4   9. Vacuuming, sweeping, or raking 4  4 3   10. Dressing  3  4 4   11. Doing up buttons 2  3 3   12. Using tools/appliances 1  4 3   13. Opening doors 0  3 3  14. Cleaning  3  4 3   15. Tying or lacing shoes 2  4 4   16. Sleeping  3  4 4   17. Laundering clothes (I.e. washing, ironing, folding) 3  4 4   18. Opening a jar 3  3 2   19. Throwing a  ball 1  3 1   20. Carrying a small suitcase with your affected limb.  1  4 3   Score total:  35/80 58/80 68/80  63/80     05/08/24- UEFS 15/80 via online version, later noted questions were different than one in epic   COGNITION: Overall cognitive status: Within functional limits for tasks assessed     SENSATION: WFL  POSTURE: rounded shoulders and  forward head   UPPER EXTREMITY ROM: AROM RUE WFL  Passive ROM Left eval L 04/06/24 L 04/13/24 L 04/20/24 L 04/24/24 L 04/27/24 L 9/17 L  9/19 L 9/26 L 06/01/24 L 06/05/24 L 06/07/24 L 06/19/24 L 07/27/24 L 07/31/24 L 08/07/24 Left 08/17/24 Left 08/23/24 Left 08/29/24 Right 08/31/24 Left 08/31/24  Shoulder flexion 100 PROM 110 AAROM 110* PROM, 120* AAROM supine  120-130* PROM (bit guarded today) 130* PROM and AROM  114 AROM 140 AAROM 145 AAROM 138 AAROM Improves to 149 AAROM 139 AAROM 148 AAROM improves to 153 AAROM 144* AAROM  130 degrees on shoulder flexion ladder  120 before manual, 135 after in supine, 140 on shoulder flexion ladder  135 deg before manual 140  130 AAROM against gravity  149 AAROM 145 AAROM AROM 90 - limited by strength 140 AAROM Improves to 146 AAROM with manual  147 AAROM 142 AAROM 145 AAROM 154 AROM 95 AROM 147 AAROM  Shoulder extension                       Shoulder abduction                    156 AROM 68 AROM with hike  Shoulder adduction                       Shoulder internal rotation         Mid glute. L5 at best with IR stretch     L5  L4     L1 L3  Shoulder external rotation 20 25* next to body PROM setaed (very guarded), 30* AAROM seated  30* PROM arm next to body  30* PROM arm next to body      30* PROM/AAROM supine arm next to body but limited by anterior shoulder pain     60 deg   C7     C7 C6  Elbow flexion                       Elbow extension                       Wrist flexion                       Wrist extension                       Wrist ulnar deviation                       Wrist radial deviation                       Wrist pronation                       Wrist supination                       (  Blank rows = not tested) *=pain/symptoms  UPPER EXTREMITY MMT: NT due to post op status  MMT Right eval Left eval Left 05/11/24 Left 06/19/24 Right 07/31/24 Left 07/31/24 Right 08/31/24 Left 08/31/24  Shoulder flexion   3 UT compensations  3 4 3 4  3-   Shoulder extension          Shoulder abduction   3 UT compensations  3 4 3 4  3-  Shoulder adduction          Shoulder internal rotation    4- 5 4- 5 4+  Shoulder external rotation    4- 4 4- 4+ 4  Middle trapezius          Lower trapezius          Elbow flexion          Elbow extension          Wrist flexion          Wrist extension          Wrist ulnar deviation          Wrist radial deviation          Wrist pronation          Wrist supination          Grip strength (lbs)          (Blank rows = not tested) *=pain/symptoms    PALPATION:  LUE gross edema 08/31/24: hyperactive grossly throughout shoulder with trigger points in deltoid                                                                                                       TODAY'S TREATMENT:                                                                                                                                         DATE:  09/05/24 Therapeutic activity:  (-On floor mat) stand > seated > sidelying > seated > standing  with use of WB thru LUE as needed  (discussing transitions to/from ground while on ski trip) Therapeutic Exercise:  -Quadruped- bird dog, alternating  -Standing wall wash circles 5 x 5 circles (varied heights) -UBE retro level 5.7, 4.5 minutes alternating directions Manual: PROM into Lt shoulder scaption; STM to Lt deltoid, teres minor/major, lats, upper trap, pec major   08/31/24 Pulley flexion/scaption 3 minutes Reassessment Manual: STM to deltoid, teres minor/major, lats, UT, pec, LS; pin and stretch pec and  LS Supine AAROM flexion with ret-dep with RTB in hands 1 x 10 Standing Row BTB 2 x 10 Standing wall wash circles 5 x 5 circles UBE retro level 5.7, 3 minutes  08/29/24 Pulley flexion/scaption 3 minutes Supine AAROM flexion 2 x 10 Manual: STM to deltoid, teres minor/major, lats, UT, pec Lat pull down machine 10# 2 x 10 Scap retraction-depression 2 x 10 Supine AAROM flexion  with ret-dep 1 x 10 Sidelying shoulder abduction 2 x 10 Standing wall slide with scap ret-depression 2 x 10  08/24/23 Manual: STM to deltoid, teres minor/major, lats, UT, pec Shoulder flexion AAROM supine 2# bar 2 x 10, on wedge 2# bar 2x 10 Sidelying shoulder ER 2# 2 x 10 Bent shoulder extension 2# 2 x 10 Standing IR GTB 3 x 10  Standing elbow extension GTB 2x 10  08/18/23 Manual: STM to deltoid, teres minor/major, lats, UT, pec Shoulder flexion AAROM supine 2 x 10 Sidelying shoulder abduction 2 x 10 Bent shoulder extension 2 x 10 Shoulder isometrics flexion, abduction, extension 5 x 10 second holds    08/14/24 Manual: STM to deltoid, teres minor/major, lats, UT, pec Shoulder flexion AAROM supine 2 x 10    08/07/24 Manual: STM to deltoid, teres minor/major, lats, UT; pin and stretch teres, lats into flexion, pin and stretch pec Shoulder flexion wall slides with lift off 1 x 8 Standing ER stretch at door 3 x 20 second holds Functional IR stretch with strap 5 x 10 second holds Shoulder flexion with loop band at wall 1 x 10 Bend over shoulder flexion 1 x 10 Supine shoulder Flexion D2 RTB 1 x 5 - difficult today      PATIENT EDUCATION:  Education details:  HEP, relevant anatomy/biomechanics, protocol/precautions. 07/31/24: reassessment findings, progress made, POC, HEP 08/31/24: reassessment findings, progress made, POC, HEP Person educated: Patient Education method: Explanation, Demonstration,  Education comprehension: verbalized understanding, returned demonstration, verbal cues required, and tactile cues required  HOME EXERCISE PROGRAM:  Access Code: [email or web address] URL: [email or web address] Date: 04/13/2024 Prepared by: Josette Rough  Exercises - Wrist AROM Flexion Extension  - 3 x daily - 7 x weekly - 2 sets - 10 reps - Seated Elbow Flexion and Extension AROM (Mirrored)  - 3 x daily - 7 x weekly - 2 sets - 10 reps - Circular Shoulder Pendulum with Table  Support (Mirrored)  - 3 x daily - 7 x weekly - 2 sets - 10 reps - Seated Scapular Retraction  - 3 x daily - 7 x weekly - 2 sets - 10 reps - Seated Shoulder Flexion Towel Slide at Table Top (Mirrored)  - 3 x daily - 7 x weekly - 10 reps - 10 second hold - Supine Shoulder Flexion AAROM with Hands Clasped  - 3 x daily - 7 x weekly - 3 sets - 10 reps - Supine Shoulder External Rotation with Dowel  - 3 x daily - 7 x weekly - 2 sets - 10 reps - Standing Isometric Shoulder Flexion with Doorway - Arm Bent  - 1 x daily - 7 x weekly - 2 sets - 10 reps - 3 seconds  hold - Standing Isometric Shoulder Abduction with Doorway - Arm Bent  - 1 x daily - 7 x weekly - 2 sets - 10 reps - 3 seconds  hold - Standing Isometric Shoulder Extension with Doorway - Arm Bent  - 1 x daily - 7 x weekly - 2 sets - 10 reps - 3 seconds  hold -  Standing Isometric Shoulder External Rotation with Doorway  - 1 x daily - 7 x weekly - 2 sets - 10 reps - 3 seconds  hold   ASSESSMENT:  CLINICAL IMPRESSION: Patient tolerated exercises well without any production of pain.  Pt requires verbal/tactile cues for improved scapular positioning with raising arm above shoulder height.  Patient continues to make good but slow progress toward remaining goals. Cueing throughout session for mechanics and proper motor control.   Patient will continue to benefit from skilled physical therapy in order to improve function and reduce impairment.      EVAL: Patient a 84 y.o. y.o. male who was seen today for physical therapy evaluation and treatment for L reverse TSA DOS 03/27/24. Patient presents with pain limited deficits in L shoulder strength, ROM, endurance, activity tolerance, and functional mobility with ADL. Patient is having to modify and restrict ADL as indicated by outcome measure score as well as subjective information and objective measures which is affecting overall participation. Patient will benefit from skilled physical therapy in order to  improve function and reduce impairment.  OBJECTIVE IMPAIRMENTS: decreased activity tolerance, decreased endurance, decreased mobility, decreased ROM, decreased strength, increased muscle spasms, impaired flexibility, impaired UE functional use, improper body mechanics, and pain  ACTIVITY LIMITATIONS:  carrying, lifting, bending, sleeping, bed mobility, bathing, dressing, reach over head, hygiene/grooming, and caring for others  PARTICIPATION LIMITATIONS:  meal prep, cleaning, laundry, driving, shopping, community activity, and yard work  PERSONAL FACTORS: Age, Time since onset of injury/illness/exacerbation, and 1-2 comorbidities: DM, HLD are also affecting patient's functional outcome.   REHAB POTENTIAL: Good  CLINICAL DECISION MAKING: Evolving/moderate complexity  EVALUATION COMPLEXITY: Moderate  GOALS: Goals reviewed with patient? Yes  SHORT TERM GOALS: Target date: 05/11/2024    Patient will be independent with HEP in order to improve functional outcomes. Baseline: Goal status: MET 06/19/24  2.  Patient will report at least 25% improvement in symptoms for improved quality of life. Baseline:  Goal status: MET 06/19/24  3.  Patient will demonstrate Elevation to 130 deg and ER to 30 deg - passive, active assisted or active  Baseline:  Goal status: MET 04/20/24  4.  Patient will be out of sling per surgeon approval for improved functional use of L shoulder. Baseline:  Goal status: MET 04/13/24   LONG TERM GOALS: Target date: 09/11/2024   Patient will report at least 75% improvement in symptoms for improved quality of life. Baseline:  Goal status: MET (65% 06/19/24)  2.  Patient will improve UEFS score by at least 25 points in order to indicate improved tolerance to activity. Baseline:  Goal status: MET 07/31/24  3.  Patient will demonstrate at least PROM/AAROM/AROM to: Elevation - 145-160; ER - 40-50 ; functional IR to L1 in L shoulder for improved ability lift  overhead. Baseline:  Goal status: IN PROGRESS - partially met 07/31/24  4.  Patient will be able to return to all activities unrestricted for improved ability to perform yard work functions and participate with family.  Baseline:  Goal status: IN PROGRESS 07/31/24  5.  Patient will demonstrate grade of 5/5 MMT grade in all tested musculature as evidence of improved strength to assist with lifting at home. Baseline:  Goal status: IN PROGRESS 07/31/24   PLAN:  PT FREQUENCY: 1-2x/week  PT DURATION: 8 weeks  PLANNED INTERVENTIONS:[id number]- PT Re-evaluation, 97110-Therapeutic exercises, 97530- Therapeutic activity, [id number]- Neuromuscular re-education, 97535- Self Care, [id number]- Manual therapy, [id number]- Gait training, [phone]- Orthotic Fit/training, [phone]- Canalith repositioning, [id number]-  Aquatic Therapy, [id number]- Splinting, 02402- Wound care (first 20 sq cm), 97598- Wound care (each additional 20 sq cm)Patient/Family education, Balance training, Stair training, Taping, Dry Needling, Joint mobilization, Joint manipulation, Spinal manipulation, Spinal mobilization, Scar mobilization, and DME instructions.  PLAN FOR NEXT SESSION: progress per protocol, progress PREs as appropriate/tolerated - WB for proximation.  Delon Aquas, PTA 09/05/24 5:49 PM [facility] [address] [place], [place], [address] Phone: [phone]   Fax:  [phone]     "

## 2024-09-18 LAB — OPHTHALMOLOGY REPORT-SCANNED

## 2024-09-20 ENCOUNTER — Encounter (HOSPITAL_BASED_OUTPATIENT_CLINIC_OR_DEPARTMENT_OTHER): Payer: Self-pay | Admitting: Physical Therapy

## 2024-09-20 ENCOUNTER — Ambulatory Visit (HOSPITAL_BASED_OUTPATIENT_CLINIC_OR_DEPARTMENT_OTHER): Attending: Orthopaedic Surgery | Admitting: Physical Therapy

## 2024-09-20 DIAGNOSIS — M25612 Stiffness of left shoulder, not elsewhere classified: Secondary | ICD-10-CM

## 2024-09-20 DIAGNOSIS — M6281 Muscle weakness (generalized): Secondary | ICD-10-CM

## 2024-09-20 DIAGNOSIS — M25512 Pain in left shoulder: Secondary | ICD-10-CM

## 2024-09-20 DIAGNOSIS — R29898 Other symptoms and signs involving the musculoskeletal system: Secondary | ICD-10-CM

## 2024-09-20 NOTE — Therapy (Signed)
 " OUTPATIENT PHYSICAL THERAPY SHOULDER TREATMENT NOTE   Patient Name: Greg Rogers. MRN: [id number] DOB:1940/12/15, 84 y.o., male Today's Date: 09/20/2024   END OF SESSION:  PT End of Session - 09/20/24 1245     Visit Number 37    Number of Visits 64    Date for Recertification  10/26/24    Authorization Type MCR    Progress Note Due on Visit 45    PT Start Time 1230    PT Stop Time 1310    PT Time Calculation (min) 40 min    Behavior During Therapy WFL for tasks assessed/performed          Past Medical History:  Diagnosis Date   Abnormal GGT test    With otherwise normal work up.   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)    Persistent disorder of initiating or maintaining sleep    Pes planus    Rosacea    Snoring    Past Surgical History:  Procedure Laterality Date   APPENDECTOMY  1949   at age 49   CERVICAL FUSION  03/2016   C 3&4   Cyst removed from breast  1960   FOOT SURGERY  01/2009   Left   HERNIA REPAIR  1956   at age 69 (single)   HERNIA REPAIR  1960   (double)   Left 2nd toe removed  03/2017   REVERSE SHOULDER ARTHROPLASTY Left 03/27/2024   Procedure: ARTHROPLASTY, SHOULDER, TOTAL, REVERSE;  Surgeon: Genelle Standing, MD;  Location: [facility];  Service: Orthopedics;  Laterality: Left;   SPINE SURGERY     TONSILLECTOMY AND ADENOIDECTOMY  ~ 1950   VASECTOMY  ~ 1984   Patient Active Problem List   Diagnosis Date Noted   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 03/27/2024   Leg pain 03/21/2024   Shoulder pain 12/07/2023   Change in stool 11/02/2023   Nasal lesion 06/02/2023   Occipital neuralgia 10/12/2020   Wound of right leg, initial encounter 07/03/2019   Healthcare maintenance 03/04/2017   Foot  pain 07/22/2016   Cervical stenosis of spine 01/06/2016   thyroid  nodule-benign path on bx 01/2016 01/06/2016   Radicular pain in right arm 12/22/2015   Radicular leg pain 12/22/2015   Back pain 07/06/2015   Constipation 07/06/2015   FH: CAD (coronary artery disease) 05/23/2015   Family history of ischemic heart disease and other diseases of the circulatory system 05/23/2015   Advance care planning 11/26/2014   Pain in joint, shoulder region 11/26/2014   Medicare annual wellness visit, subsequent 09/27/2012   GERD (gastroesophageal reflux disease) 07/05/2012   Irritability 03/29/2011   Advance directive on file 01/09/2011   Pain in joint, other site 01/04/2011   Arthropathy 09/02/2010   NEPHROLITHIASIS, HX OF 09/02/2010   Diabetes mellitus without complication (HCC) 09/01/2010   HLD (hyperlipidemia) 10/24/2009   SNORING 10/24/2009   FOOT PAIN, BILATERAL 10/06/2009   Pes planus 10/06/2009   Acquired flat foot 10/06/2009    PCP: Cleatus Arlyss RAMAN, MD  REFERRING PROVIDER: Genelle Standing, MD  REFERRING DIAG: S46.012A (ICD-10-CM) - 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, initial encounter;  1. Left shoulder reverse  shoulder arthroplasty 2. Left shoulder biceps tenodesis  THERAPY DIAG:  Acute pain of left shoulder  Stiffness of left shoulder, not elsewhere classified  Muscle weakness (generalized)  Other symptoms and signs involving the musculoskeletal system  Rationale for Evaluation and Treatment: Rehabilitation  ONSET DATE: DOS 03/27/24  SUBJECTIVE:                                                                                                                                                                                      SUBJECTIVE STATEMENT: Patient reports he had quantum leap over the last week.  Pt reports he went skiing 4 days.  He reports he can tell he is getting stronger.  Still has difficulty lifting LUE above shoulder height without help from other  arm.   EVAL: Patient states shoulder has been doing good. Using sling. Was very active prior to injury/surgery with gardening/yard work Hand dominance: Right  PERTINENT HISTORY: DM, HLD, chronic shoulder pain with fall in March while exiting ski lift with RC tear  PAIN:  Are you having pain? no 0/10 Location: anterior Lt shoulder/upper arm Description:  Worsened by: raising arm to front Relieved by:  massage, icy hot      PRECAUTIONS: Other: L reverse TSA   RED FLAGS: None   WEIGHT BEARING RESTRICTIONS: NWB LUE at eval  FALLS:  Has patient fallen in last 6 months? Yes. Number of falls 1  PLOF: Independent  PATIENT GOALS:regain use of the arm  OBJECTIVE: (objective measures from initial evaluation unless otherwise dated)  OBSERVATION: using sling, waterproof dressing intact  PATIENT SURVEYS:  UEFS  Extreme difficulty/unable (0), Quite a bit of difficulty (1), Moderate difficulty (2), Little difficulty (3), No difficulty (4) Survey date:  03/30/24 06/19/24 07/31/24 08/31/24  Any of your usual work, household or school activities 0  3 3  2. Your usual hobbies, recreational/sport activities 1  2 2    3. Lifting a bag of groceries to waist level 2  3 4    4. Lifting a bag of groceries above your head 0  1 2  5. Grooming your hair 4  4 3   6. Pushing up on your hands (I.e. from bathtub or chair) 0  3 4  7. Preparing food (I.e. peeling/cutting) 1  4 4   8. Driving  3  4 4   9. Vacuuming, sweeping, or raking 4  4 3   10. Dressing  3  4 4   11. Doing up buttons 2  3 3   12. Using tools/appliances 1  4 3   13. Opening doors 0  3 3  14. Cleaning  3  4 3   15. Tying or lacing shoes  2  4 4   16. Sleeping  3  4 4   17. Laundering clothes (I.e. washing, ironing, folding) 3  4 4   18. Opening a jar 3  3 2   19. Throwing a ball 1  3 1   20. Carrying a small suitcase with your affected limb.  1  4 3   Score total:  35/80 58/80 68/80  63/80     05/08/24- UEFS 15/80 via online version, later  noted questions were different than one in epic   COGNITION: Overall cognitive status: Within functional limits for tasks assessed     SENSATION: WFL  POSTURE: rounded shoulders and forward head   UPPER EXTREMITY ROM: AROM RUE WFL  Passive ROM Left eval L 04/06/24 L 04/13/24 L 04/20/24 L 04/24/24 L 04/27/24 L 9/17 L  9/19 L 9/26 L 06/01/24 L 06/05/24 L 06/07/24 L 06/19/24 L 07/27/24 L 07/31/24 L 08/07/24 Left 08/17/24 Left 08/23/24 Left 08/29/24 Right 08/31/24 Left 08/31/24  Shoulder flexion 100 PROM 110 AAROM 110* PROM, 120* AAROM supine  120-130* PROM (bit guarded today) 130* PROM and AROM  114 AROM 140 AAROM 145 AAROM 138 AAROM Improves to 149 AAROM 139 AAROM 148 AAROM improves to 153 AAROM 144* AAROM  130 degrees on shoulder flexion ladder  120 before manual, 135 after in supine, 140 on shoulder flexion ladder  135 deg before manual 140  130 AAROM against gravity  149 AAROM 145 AAROM AROM 90 - limited by strength 140 AAROM Improves to 146 AAROM with manual  147 AAROM 142 AAROM 145 AAROM 154 AROM 95 AROM 147 AAROM  Shoulder extension                       Shoulder abduction                    156 AROM 68 AROM with hike  Shoulder adduction                       Shoulder internal rotation         Mid glute. L5 at best with IR stretch     L5  L4     L1 L3  Shoulder external rotation 20 25* next to body PROM setaed (very guarded), 30* AAROM seated  30* PROM arm next to body  30* PROM arm next to body      30* PROM/AAROM supine arm next to body but limited by anterior shoulder pain     60 deg   C7     C7 C6  Elbow flexion                       Elbow extension                       Wrist flexion                       Wrist extension                       Wrist ulnar deviation                       Wrist radial deviation                       Wrist pronation  Wrist supination                       (Blank rows = not tested) *=pain/symptoms  UPPER EXTREMITY MMT: NT due  to post op status  MMT Right eval Left eval Left 05/11/24 Left 06/19/24 Right 07/31/24 Left 07/31/24 Right 08/31/24 Left 08/31/24  Shoulder flexion   3 UT compensations  3 4 3 4  3-  Shoulder extension          Shoulder abduction   3 UT compensations  3 4 3 4  3-  Shoulder adduction          Shoulder internal rotation    4- 5 4- 5 4+  Shoulder external rotation    4- 4 4- 4+ 4  Middle trapezius          Lower trapezius          Elbow flexion          Elbow extension          Wrist flexion          Wrist extension          Wrist ulnar deviation          Wrist radial deviation          Wrist pronation          Wrist supination          Grip strength (lbs)          (Blank rows = not tested) *=pain/symptoms    PALPATION:  LUE gross edema 08/31/24: hyperactive grossly throughout shoulder with trigger points in deltoid                                                                                                       TODAY'S TREATMENT:                                                                                                                                         DATE:  09/20/24 -UBE L1:  1.5 min each directions, towel roll behind back for improved posture - Lt server forward lift (Lt shoulder flexion with palm up to ~70 deg), 1#,  in front of mirror, tactile cues to relax upper trap - bil low row with blue band x 10 (eccentric return), black x 3  -D2 flexion with yellow -> no resistance with  tactile cues for form (some soreness) - supine bil horz abdct  with red band -> green band - supine Lt shoulder flexion / extension (full range) with 1# x 10-> 2# x 10  09/05/24 Therapeutic activity:  (-On floor mat) stand > seated > sidelying > seated > standing  with use of WB thru LUE as needed  (discussing transitions to/from ground while on ski trip) Therapeutic Exercise:  -Quadruped- bird dog, alternating  -Standing wall wash circles 5 x 5 circles (varied heights) -UBE retro  level 5.7, 4.5 minutes alternating directions Manual: PROM into Lt shoulder scaption; STM to Lt deltoid, teres minor/major, lats, upper trap, pec major   08/31/24 Pulley flexion/scaption 3 minutes Reassessment Manual: STM to deltoid, teres minor/major, lats, UT, pec, LS; pin and stretch pec and LS Supine AAROM flexion with ret-dep with RTB in hands 1 x 10 Standing Row BTB 2 x 10 Standing wall wash circles 5 x 5 circles UBE retro level 5.7, 3 minutes  08/29/24 Pulley flexion/scaption 3 minutes Supine AAROM flexion 2 x 10 Manual: STM to deltoid, teres minor/major, lats, UT, pec Lat pull down machine 10# 2 x 10 Scap retraction-depression 2 x 10 Supine AAROM flexion with ret-dep 1 x 10 Sidelying shoulder abduction 2 x 10 Standing wall slide with scap ret-depression 2 x 10  08/24/23 Manual: STM to deltoid, teres minor/major, lats, UT, pec Shoulder flexion AAROM supine 2# bar 2 x 10, on wedge 2# bar 2x 10 Sidelying shoulder ER 2# 2 x 10 Bent shoulder extension 2# 2 x 10 Standing IR GTB 3 x 10  Standing elbow extension GTB 2x 10  08/18/23 Manual: STM to deltoid, teres minor/major, lats, UT, pec Shoulder flexion AAROM supine 2 x 10 Sidelying shoulder abduction 2 x 10 Bent shoulder extension 2 x 10 Shoulder isometrics flexion, abduction, extension 5 x 10 second holds    08/14/24 Manual: STM to deltoid, teres minor/major, lats, UT, pec Shoulder flexion AAROM supine 2 x 10    08/07/24 Manual: STM to deltoid, teres minor/major, lats, UT; pin and stretch teres, lats into flexion, pin and stretch pec Shoulder flexion wall slides with lift off 1 x 8 Standing ER stretch at door 3 x 20 second holds Functional IR stretch with strap 5 x 10 second holds Shoulder flexion with loop band at wall 1 x 10 Bend over shoulder flexion 1 x 10 Supine shoulder Flexion D2 RTB 1 x 5 - difficult today      PATIENT EDUCATION:  Education details:  HEP, relevant anatomy/biomechanics,  protocol/precautions.  Person educated: Patient Education method: Explanation, Demonstration,  Education comprehension: verbalized understanding, returned demonstration, verbal cues required, and tactile cues required  HOME EXERCISE PROGRAM:  Access Code: [email or web address] URL: [email or web address]    ASSESSMENT:  CLINICAL IMPRESSION: Patient tolerated exercises well with some soreness/Lt shoulder fatigue at end of session.  Pt requires verbal/tactile cues for improved scapular positioning with exercises, especially when performed in standing position.Cueing throughout session for mechanics and proper motor control. Patient continues to make steady progress toward remaining goals.    Patient will continue to benefit from skilled physical therapy in order to improve function and reduce impairment.  Therapist to take measurements next session for upcoming dr's visit.     EVAL: Patient a 84 y.o. y.o. male who was seen today for physical therapy evaluation and treatment for L reverse TSA DOS 03/27/24. Patient presents with pain limited deficits in L shoulder strength, ROM, endurance, activity tolerance, and functional mobility with ADL. Patient is having to modify and restrict ADL as indicated by outcome measure  score as well as subjective information and objective measures which is affecting overall participation. Patient will benefit from skilled physical therapy in order to improve function and reduce impairment.  OBJECTIVE IMPAIRMENTS: decreased activity tolerance, decreased endurance, decreased mobility, decreased ROM, decreased strength, increased muscle spasms, impaired flexibility, impaired UE functional use, improper body mechanics, and pain  ACTIVITY LIMITATIONS:  carrying, lifting, bending, sleeping, bed mobility, bathing, dressing, reach over head, hygiene/grooming, and caring for others  PARTICIPATION LIMITATIONS:  meal prep, cleaning, laundry, driving, shopping, community  activity, and yard work  PERSONAL FACTORS: Age, Time since onset of injury/illness/exacerbation, and 1-2 comorbidities: DM, HLD are also affecting patient's functional outcome.   REHAB POTENTIAL: Good  CLINICAL DECISION MAKING: Evolving/moderate complexity  EVALUATION COMPLEXITY: Moderate  GOALS: Goals reviewed with patient? Yes  SHORT TERM GOALS: Target date: 05/11/2024    Patient will be independent with HEP in order to improve functional outcomes. Baseline: Goal status: MET 06/19/24  2.  Patient will report at least 25% improvement in symptoms for improved quality of life. Baseline:  Goal status: MET 06/19/24  3.  Patient will demonstrate Elevation to 130 deg and ER to 30 deg - passive, active assisted or active  Baseline:  Goal status: MET 04/20/24  4.  Patient will be out of sling per surgeon approval for improved functional use of L shoulder. Baseline:  Goal status: MET 04/13/24   LONG TERM GOALS: Target date: 09/11/2024   Patient will report at least 75% improvement in symptoms for improved quality of life. Baseline:  Goal status: MET (65% 06/19/24)  2.  Patient will improve UEFS score by at least 25 points in order to indicate improved tolerance to activity. Baseline:  Goal status: MET 07/31/24  3.  Patient will demonstrate at least PROM/AAROM/AROM to: Elevation - 145-160; ER - 40-50 ; functional IR to L1 in L shoulder for improved ability lift overhead. Baseline:  Goal status: IN PROGRESS - partially met 07/31/24  4.  Patient will be able to return to all activities unrestricted for improved ability to perform yard work functions and participate with family.  Baseline:  Goal status: IN PROGRESS 07/31/24  5.  Patient will demonstrate grade of 5/5 MMT grade in all tested musculature as evidence of improved strength to assist with lifting at home. Baseline:  Goal status: IN PROGRESS 07/31/24   PLAN:  PT FREQUENCY: 1-2x/week  PT DURATION: 8 weeks  PLANNED  INTERVENTIONS:[id number]- PT Re-evaluation, 97110-Therapeutic exercises, 97530- Therapeutic activity, 97112- Neuromuscular re-education, 97535- Self Care, [id number]- Manual therapy, [phone]- Gait training, [phone]- Orthotic Fit/training, [phone]- Canalith repositioning, [id number]- Aquatic Therapy, [phone]- Splinting, [phone]- Wound care (first 20 sq cm), 97598- Wound care (each additional 20 sq cm)Patient/Family education, Balance training, Stair training, Taping, Dry Needling, Joint mobilization, Joint manipulation, Spinal manipulation, Spinal mobilization, Scar mobilization, and DME instructions.  PLAN FOR NEXT SESSION: progress per protocol, progress PREs as appropriate/tolerated - WB for proximation.   Delon Aquas, PTA 09/20/24 2:08 PM [facility] [address] [place], [place], [address] Phone: [phone]   Fax:  [phone]  "

## 2024-09-25 ENCOUNTER — Ambulatory Visit (HOSPITAL_BASED_OUTPATIENT_CLINIC_OR_DEPARTMENT_OTHER): Admitting: Physical Therapy

## 2024-09-27 ENCOUNTER — Ambulatory Visit (HOSPITAL_BASED_OUTPATIENT_CLINIC_OR_DEPARTMENT_OTHER): Admitting: Orthopaedic Surgery

## 2024-09-28 ENCOUNTER — Encounter (HOSPITAL_BASED_OUTPATIENT_CLINIC_OR_DEPARTMENT_OTHER): Admitting: Physical Therapy

## 2024-10-02 ENCOUNTER — Encounter (HOSPITAL_BASED_OUTPATIENT_CLINIC_OR_DEPARTMENT_OTHER): Admitting: Physical Therapy

## 2024-10-04 ENCOUNTER — Encounter (HOSPITAL_BASED_OUTPATIENT_CLINIC_OR_DEPARTMENT_OTHER): Admitting: Physical Therapy

## 2024-10-10 ENCOUNTER — Encounter (HOSPITAL_BASED_OUTPATIENT_CLINIC_OR_DEPARTMENT_OTHER): Admitting: Physical Therapy

## 2024-10-12 ENCOUNTER — Encounter (HOSPITAL_BASED_OUTPATIENT_CLINIC_OR_DEPARTMENT_OTHER): Admitting: Physical Therapy

## 2024-10-17 ENCOUNTER — Encounter (HOSPITAL_BASED_OUTPATIENT_CLINIC_OR_DEPARTMENT_OTHER): Admitting: Physical Therapy

## 2024-10-19 ENCOUNTER — Encounter (HOSPITAL_BASED_OUTPATIENT_CLINIC_OR_DEPARTMENT_OTHER): Admitting: Physical Therapy

## 2024-10-24 ENCOUNTER — Encounter (HOSPITAL_BASED_OUTPATIENT_CLINIC_OR_DEPARTMENT_OTHER): Admitting: Physical Therapy

## 2024-10-26 ENCOUNTER — Encounter (HOSPITAL_BASED_OUTPATIENT_CLINIC_OR_DEPARTMENT_OTHER): Admitting: Physical Therapy

## 2024-11-02 ENCOUNTER — Encounter (HOSPITAL_BASED_OUTPATIENT_CLINIC_OR_DEPARTMENT_OTHER): Admitting: Physical Therapy

## 2024-11-09 ENCOUNTER — Encounter (HOSPITAL_BASED_OUTPATIENT_CLINIC_OR_DEPARTMENT_OTHER): Admitting: Physical Therapy

## 2024-12-14 ENCOUNTER — Ambulatory Visit

## 2025-01-04 ENCOUNTER — Ambulatory Visit: Admitting: Podiatry
